# Patient Record
Sex: Male | Born: 1950 | Race: White | Hispanic: No | Marital: Married | Smoking: Never smoker
Health system: Southern US, Community
[De-identification: ages and names within clinical notes are randomized; demographics above are authoritative.]

## PROBLEM LIST (undated history)

## (undated) DIAGNOSIS — I10 Essential (primary) hypertension: Secondary | ICD-10-CM

## (undated) DIAGNOSIS — D099 Carcinoma in situ, unspecified: Secondary | ICD-10-CM

## (undated) DIAGNOSIS — I2109 ST elevation (STEMI) myocardial infarction involving other coronary artery of anterior wall: Secondary | ICD-10-CM

## (undated) DIAGNOSIS — G4733 Obstructive sleep apnea (adult) (pediatric): Secondary | ICD-10-CM

## (undated) DIAGNOSIS — I729 Aneurysm of unspecified site: Secondary | ICD-10-CM

## (undated) DIAGNOSIS — I639 Cerebral infarction, unspecified: Secondary | ICD-10-CM

## (undated) DIAGNOSIS — I472 Ventricular tachycardia: Secondary | ICD-10-CM

## (undated) DIAGNOSIS — Z9989 Dependence on other enabling machines and devices: Secondary | ICD-10-CM

## (undated) DIAGNOSIS — C4492 Squamous cell carcinoma of skin, unspecified: Secondary | ICD-10-CM

## (undated) DIAGNOSIS — I5022 Chronic systolic (congestive) heart failure: Secondary | ICD-10-CM

## (undated) DIAGNOSIS — C4491 Basal cell carcinoma of skin, unspecified: Secondary | ICD-10-CM

## (undated) DIAGNOSIS — I2510 Atherosclerotic heart disease of native coronary artery without angina pectoris: Secondary | ICD-10-CM

## (undated) DIAGNOSIS — Z8719 Personal history of other diseases of the digestive system: Secondary | ICD-10-CM

## (undated) DIAGNOSIS — I255 Ischemic cardiomyopathy: Secondary | ICD-10-CM

## (undated) DIAGNOSIS — Z95810 Presence of automatic (implantable) cardiac defibrillator: Secondary | ICD-10-CM

## (undated) DIAGNOSIS — I4729 Other ventricular tachycardia: Secondary | ICD-10-CM

## (undated) DIAGNOSIS — I480 Paroxysmal atrial fibrillation: Secondary | ICD-10-CM

## (undated) DIAGNOSIS — I770 Arteriovenous fistula, acquired: Secondary | ICD-10-CM

## (undated) DIAGNOSIS — G4739 Other sleep apnea: Secondary | ICD-10-CM

## (undated) HISTORY — DX: Aneurysm of unspecified site: I72.9

## (undated) HISTORY — DX: Chronic systolic (congestive) heart failure: I50.22

## (undated) HISTORY — DX: Atherosclerotic heart disease of native coronary artery without angina pectoris: I25.10

## (undated) HISTORY — DX: Basal cell carcinoma of skin, unspecified: C44.91

## (undated) HISTORY — DX: ST elevation (STEMI) myocardial infarction involving other coronary artery of anterior wall: I21.09

## (undated) HISTORY — DX: Ventricular tachycardia: I47.2

## (undated) HISTORY — DX: Other ventricular tachycardia: I47.29

## (undated) HISTORY — DX: Ischemic cardiomyopathy: I25.5

## (undated) HISTORY — DX: Squamous cell carcinoma of skin, unspecified: C44.92

## (undated) HISTORY — DX: Carcinoma in situ, unspecified: D09.9

## (undated) HISTORY — DX: Obstructive sleep apnea (adult) (pediatric): G47.33

## (undated) HISTORY — DX: Paroxysmal atrial fibrillation: I48.0

## (undated) HISTORY — PX: EYE SURGERY: SHX253

## (undated) HISTORY — DX: Cerebral infarction, unspecified: I63.9

## (undated) HISTORY — DX: Dependence on other enabling machines and devices: Z99.89

## (undated) HISTORY — DX: Arteriovenous fistula, acquired: I77.0

## (undated) HISTORY — DX: Other sleep apnea: G47.39

---

## 1998-07-24 ENCOUNTER — Inpatient Hospital Stay (HOSPITAL_COMMUNITY): Admission: EM | Admit: 1998-07-24 | Discharge: 1998-07-25 | Payer: Self-pay | Admitting: Cardiology

## 1999-04-24 ENCOUNTER — Encounter: Payer: Self-pay | Admitting: Internal Medicine

## 1999-04-24 ENCOUNTER — Inpatient Hospital Stay (HOSPITAL_COMMUNITY): Admission: EM | Admit: 1999-04-24 | Discharge: 1999-04-26 | Payer: Self-pay | Admitting: Internal Medicine

## 2000-05-26 ENCOUNTER — Emergency Department (HOSPITAL_COMMUNITY): Admission: EM | Admit: 2000-05-26 | Discharge: 2000-05-26 | Payer: Self-pay | Admitting: Emergency Medicine

## 2000-07-24 ENCOUNTER — Ambulatory Visit (HOSPITAL_COMMUNITY): Admission: RE | Admit: 2000-07-24 | Discharge: 2000-07-24 | Payer: Self-pay | Admitting: Internal Medicine

## 2000-07-24 ENCOUNTER — Encounter: Payer: Self-pay | Admitting: Internal Medicine

## 2000-09-29 ENCOUNTER — Ambulatory Visit (HOSPITAL_COMMUNITY): Admission: AD | Admit: 2000-09-29 | Discharge: 2000-10-01 | Payer: Self-pay | Admitting: Internal Medicine

## 2000-09-30 ENCOUNTER — Encounter: Payer: Self-pay | Admitting: Internal Medicine

## 2003-04-27 ENCOUNTER — Inpatient Hospital Stay (HOSPITAL_COMMUNITY): Admission: EM | Admit: 2003-04-27 | Discharge: 2003-05-04 | Payer: Self-pay | Admitting: Emergency Medicine

## 2003-04-27 ENCOUNTER — Encounter: Payer: Self-pay | Admitting: Emergency Medicine

## 2003-04-28 ENCOUNTER — Encounter: Payer: Self-pay | Admitting: Internal Medicine

## 2004-08-11 ENCOUNTER — Ambulatory Visit: Payer: Self-pay | Admitting: Cardiology

## 2004-09-09 ENCOUNTER — Ambulatory Visit: Payer: Self-pay | Admitting: Cardiology

## 2004-09-19 DIAGNOSIS — Z95810 Presence of automatic (implantable) cardiac defibrillator: Secondary | ICD-10-CM

## 2004-09-19 HISTORY — DX: Presence of automatic (implantable) cardiac defibrillator: Z95.810

## 2004-10-28 ENCOUNTER — Ambulatory Visit: Payer: Self-pay | Admitting: Cardiology

## 2004-11-24 ENCOUNTER — Ambulatory Visit: Payer: Self-pay | Admitting: Cardiology

## 2004-12-22 ENCOUNTER — Ambulatory Visit: Payer: Self-pay | Admitting: Cardiology

## 2005-01-27 ENCOUNTER — Ambulatory Visit: Payer: Self-pay | Admitting: Cardiology

## 2005-02-09 ENCOUNTER — Ambulatory Visit: Payer: Self-pay | Admitting: Cardiology

## 2005-02-25 ENCOUNTER — Ambulatory Visit: Payer: Self-pay | Admitting: Cardiology

## 2005-04-01 ENCOUNTER — Ambulatory Visit: Payer: Self-pay | Admitting: *Deleted

## 2005-05-05 ENCOUNTER — Ambulatory Visit: Payer: Self-pay | Admitting: Cardiology

## 2005-06-03 ENCOUNTER — Ambulatory Visit: Payer: Self-pay | Admitting: Cardiology

## 2005-06-08 ENCOUNTER — Ambulatory Visit: Payer: Self-pay | Admitting: Internal Medicine

## 2005-06-09 ENCOUNTER — Ambulatory Visit: Payer: Self-pay | Admitting: Internal Medicine

## 2005-06-23 ENCOUNTER — Ambulatory Visit: Payer: Self-pay | Admitting: Cardiology

## 2005-06-23 ENCOUNTER — Inpatient Hospital Stay (HOSPITAL_BASED_OUTPATIENT_CLINIC_OR_DEPARTMENT_OTHER): Admission: RE | Admit: 2005-06-23 | Discharge: 2005-06-23 | Payer: Self-pay | Admitting: Cardiology

## 2005-07-01 ENCOUNTER — Ambulatory Visit: Payer: Self-pay | Admitting: Cardiology

## 2005-07-12 ENCOUNTER — Ambulatory Visit: Payer: Self-pay | Admitting: Cardiology

## 2005-07-21 ENCOUNTER — Ambulatory Visit: Payer: Self-pay | Admitting: Cardiology

## 2005-11-25 ENCOUNTER — Ambulatory Visit: Payer: Self-pay | Admitting: Cardiology

## 2005-12-09 ENCOUNTER — Ambulatory Visit: Payer: Self-pay | Admitting: Cardiology

## 2005-12-14 ENCOUNTER — Ambulatory Visit: Payer: Self-pay | Admitting: Cardiology

## 2005-12-28 ENCOUNTER — Ambulatory Visit: Payer: Self-pay | Admitting: Cardiology

## 2006-01-11 ENCOUNTER — Ambulatory Visit: Payer: Self-pay | Admitting: Cardiology

## 2006-01-13 ENCOUNTER — Ambulatory Visit: Payer: Self-pay | Admitting: Cardiology

## 2006-01-27 ENCOUNTER — Ambulatory Visit: Payer: Self-pay | Admitting: Cardiology

## 2006-02-01 ENCOUNTER — Ambulatory Visit: Payer: Self-pay | Admitting: Internal Medicine

## 2006-02-03 ENCOUNTER — Ambulatory Visit: Payer: Self-pay | Admitting: Cardiology

## 2006-05-14 ENCOUNTER — Ambulatory Visit: Payer: Self-pay | Admitting: Internal Medicine

## 2006-06-09 ENCOUNTER — Ambulatory Visit: Payer: Self-pay | Admitting: Cardiology

## 2006-08-03 ENCOUNTER — Ambulatory Visit: Payer: Self-pay | Admitting: Cardiology

## 2006-08-04 DIAGNOSIS — C4492 Squamous cell carcinoma of skin, unspecified: Secondary | ICD-10-CM

## 2006-08-04 DIAGNOSIS — D099 Carcinoma in situ, unspecified: Secondary | ICD-10-CM

## 2006-08-04 HISTORY — DX: Carcinoma in situ, unspecified: D09.9

## 2006-08-04 HISTORY — DX: Squamous cell carcinoma of skin, unspecified: C44.92

## 2006-08-29 ENCOUNTER — Ambulatory Visit: Payer: Self-pay | Admitting: Cardiology

## 2006-09-07 ENCOUNTER — Ambulatory Visit: Payer: Self-pay | Admitting: Cardiology

## 2006-10-18 ENCOUNTER — Ambulatory Visit: Payer: Self-pay | Admitting: Cardiology

## 2006-11-09 ENCOUNTER — Ambulatory Visit: Payer: Self-pay | Admitting: Internal Medicine

## 2006-11-10 ENCOUNTER — Ambulatory Visit: Payer: Self-pay | Admitting: Cardiology

## 2006-12-01 ENCOUNTER — Ambulatory Visit: Payer: Self-pay | Admitting: Internal Medicine

## 2006-12-11 ENCOUNTER — Ambulatory Visit: Payer: Self-pay | Admitting: Cardiology

## 2006-12-25 ENCOUNTER — Ambulatory Visit: Payer: Self-pay | Admitting: Cardiology

## 2007-01-01 ENCOUNTER — Ambulatory Visit: Payer: Self-pay | Admitting: Cardiology

## 2007-01-05 ENCOUNTER — Ambulatory Visit: Payer: Self-pay | Admitting: Cardiology

## 2007-01-22 ENCOUNTER — Ambulatory Visit: Payer: Self-pay | Admitting: Cardiology

## 2007-01-30 ENCOUNTER — Ambulatory Visit: Payer: Self-pay | Admitting: Cardiology

## 2007-02-09 ENCOUNTER — Ambulatory Visit: Payer: Self-pay | Admitting: Internal Medicine

## 2007-03-02 ENCOUNTER — Ambulatory Visit: Payer: Self-pay | Admitting: Physician Assistant

## 2007-03-18 ENCOUNTER — Ambulatory Visit: Payer: Self-pay | Admitting: Internal Medicine

## 2007-03-21 ENCOUNTER — Ambulatory Visit: Payer: Self-pay | Admitting: Internal Medicine

## 2007-03-27 ENCOUNTER — Ambulatory Visit: Payer: Self-pay | Admitting: Cardiology

## 2007-04-06 ENCOUNTER — Ambulatory Visit: Payer: Self-pay | Admitting: Cardiology

## 2007-04-06 ENCOUNTER — Ambulatory Visit: Payer: Self-pay | Admitting: Internal Medicine

## 2007-05-01 ENCOUNTER — Ambulatory Visit: Payer: Self-pay | Admitting: Cardiology

## 2007-05-02 ENCOUNTER — Encounter: Payer: Self-pay | Admitting: Cardiology

## 2007-05-02 ENCOUNTER — Ambulatory Visit: Payer: Self-pay | Admitting: Physician Assistant

## 2007-06-01 ENCOUNTER — Ambulatory Visit: Payer: Self-pay | Admitting: Cardiology

## 2007-06-08 ENCOUNTER — Ambulatory Visit: Payer: Self-pay | Admitting: Cardiology

## 2007-06-27 ENCOUNTER — Ambulatory Visit: Payer: Self-pay | Admitting: Cardiology

## 2007-07-05 ENCOUNTER — Ambulatory Visit: Payer: Self-pay | Admitting: Internal Medicine

## 2007-07-13 ENCOUNTER — Ambulatory Visit: Payer: Self-pay | Admitting: Cardiology

## 2007-08-10 ENCOUNTER — Ambulatory Visit: Payer: Self-pay | Admitting: Cardiology

## 2007-08-24 ENCOUNTER — Ambulatory Visit: Payer: Self-pay | Admitting: Cardiology

## 2007-09-07 ENCOUNTER — Ambulatory Visit: Payer: Self-pay | Admitting: Internal Medicine

## 2007-09-10 ENCOUNTER — Ambulatory Visit: Payer: Self-pay | Admitting: Cardiology

## 2007-09-17 ENCOUNTER — Ambulatory Visit: Payer: Self-pay | Admitting: Cardiology

## 2007-10-04 ENCOUNTER — Encounter: Payer: Self-pay | Admitting: Cardiology

## 2007-10-18 ENCOUNTER — Ambulatory Visit: Payer: Self-pay | Admitting: Cardiology

## 2007-11-16 ENCOUNTER — Ambulatory Visit: Payer: Self-pay | Admitting: Cardiology

## 2007-12-07 ENCOUNTER — Ambulatory Visit: Payer: Self-pay | Admitting: Cardiology

## 2007-12-13 ENCOUNTER — Ambulatory Visit: Payer: Self-pay | Admitting: Internal Medicine

## 2007-12-24 ENCOUNTER — Ambulatory Visit: Payer: Self-pay | Admitting: Cardiology

## 2008-01-10 ENCOUNTER — Ambulatory Visit: Payer: Self-pay | Admitting: Internal Medicine

## 2008-02-05 ENCOUNTER — Ambulatory Visit: Payer: Self-pay | Admitting: Cardiology

## 2008-02-19 ENCOUNTER — Ambulatory Visit: Payer: Self-pay | Admitting: Cardiology

## 2008-03-04 ENCOUNTER — Ambulatory Visit: Payer: Self-pay | Admitting: Cardiology

## 2008-04-15 ENCOUNTER — Ambulatory Visit: Payer: Self-pay | Admitting: Cardiology

## 2008-05-13 ENCOUNTER — Ambulatory Visit: Payer: Self-pay | Admitting: Cardiology

## 2008-06-10 ENCOUNTER — Ambulatory Visit: Payer: Self-pay | Admitting: Cardiology

## 2008-06-17 ENCOUNTER — Ambulatory Visit: Payer: Self-pay | Admitting: Cardiology

## 2008-06-19 ENCOUNTER — Encounter: Payer: Self-pay | Admitting: Cardiology

## 2008-06-26 ENCOUNTER — Ambulatory Visit: Payer: Self-pay | Admitting: Cardiology

## 2008-06-26 ENCOUNTER — Inpatient Hospital Stay (HOSPITAL_BASED_OUTPATIENT_CLINIC_OR_DEPARTMENT_OTHER): Admission: RE | Admit: 2008-06-26 | Discharge: 2008-06-26 | Payer: Self-pay | Admitting: Cardiology

## 2008-06-26 ENCOUNTER — Ambulatory Visit: Payer: Self-pay | Admitting: Cardiovascular Disease

## 2008-06-26 ENCOUNTER — Observation Stay (HOSPITAL_COMMUNITY): Admission: AD | Admit: 2008-06-26 | Discharge: 2008-06-27 | Payer: Self-pay | Admitting: Cardiology

## 2008-06-26 ENCOUNTER — Encounter: Payer: Self-pay | Admitting: Cardiology

## 2008-07-01 ENCOUNTER — Ambulatory Visit: Payer: Self-pay | Admitting: Cardiology

## 2008-07-01 ENCOUNTER — Encounter: Payer: Self-pay | Admitting: Physician Assistant

## 2008-07-01 ENCOUNTER — Encounter: Payer: Self-pay | Admitting: Cardiology

## 2008-07-03 ENCOUNTER — Ambulatory Visit (HOSPITAL_COMMUNITY): Admission: RE | Admit: 2008-07-03 | Discharge: 2008-07-03 | Payer: Self-pay | Admitting: Cardiovascular Disease

## 2008-07-03 ENCOUNTER — Ambulatory Visit: Payer: Self-pay | Admitting: Cardiovascular Disease

## 2008-07-03 ENCOUNTER — Ambulatory Visit: Payer: Self-pay | Admitting: Surgery

## 2008-07-03 ENCOUNTER — Encounter: Payer: Self-pay | Admitting: Cardiovascular Disease

## 2008-07-04 ENCOUNTER — Ambulatory Visit: Payer: Self-pay | Admitting: Cardiology

## 2008-07-08 ENCOUNTER — Ambulatory Visit: Payer: Self-pay | Admitting: Cardiology

## 2008-07-22 ENCOUNTER — Ambulatory Visit: Payer: Self-pay | Admitting: Cardiology

## 2008-08-05 ENCOUNTER — Encounter: Payer: Self-pay | Admitting: Cardiology

## 2008-08-05 ENCOUNTER — Ambulatory Visit: Payer: Self-pay | Admitting: Cardiology

## 2008-08-05 DIAGNOSIS — I255 Ischemic cardiomyopathy: Secondary | ICD-10-CM

## 2008-08-05 DIAGNOSIS — I7789 Other specified disorders of arteries and arterioles: Secondary | ICD-10-CM

## 2008-08-05 DIAGNOSIS — I2510 Atherosclerotic heart disease of native coronary artery without angina pectoris: Secondary | ICD-10-CM | POA: Insufficient documentation

## 2008-08-05 DIAGNOSIS — I253 Aneurysm of heart: Secondary | ICD-10-CM | POA: Insufficient documentation

## 2008-08-05 DIAGNOSIS — I6350 Cerebral infarction due to unspecified occlusion or stenosis of unspecified cerebral artery: Secondary | ICD-10-CM | POA: Insufficient documentation

## 2008-08-26 ENCOUNTER — Ambulatory Visit: Payer: Self-pay | Admitting: Cardiology

## 2008-08-28 ENCOUNTER — Encounter: Payer: Self-pay | Admitting: Cardiology

## 2008-09-03 ENCOUNTER — Ambulatory Visit: Payer: Self-pay | Admitting: Cardiology

## 2008-09-23 ENCOUNTER — Ambulatory Visit: Payer: Self-pay | Admitting: Internal Medicine

## 2008-09-23 ENCOUNTER — Ambulatory Visit: Payer: Self-pay | Admitting: Cardiology

## 2008-10-10 ENCOUNTER — Ambulatory Visit: Payer: Self-pay | Admitting: Cardiology

## 2008-10-29 ENCOUNTER — Encounter: Payer: Self-pay | Admitting: Cardiology

## 2008-10-29 ENCOUNTER — Ambulatory Visit (HOSPITAL_COMMUNITY): Admission: RE | Admit: 2008-10-29 | Discharge: 2008-10-29 | Payer: Self-pay | Admitting: Internal Medicine

## 2008-10-29 ENCOUNTER — Ambulatory Visit: Payer: Self-pay | Admitting: Internal Medicine

## 2008-10-30 ENCOUNTER — Encounter: Payer: Self-pay | Admitting: Internal Medicine

## 2008-10-31 ENCOUNTER — Ambulatory Visit: Payer: Self-pay | Admitting: Cardiology

## 2008-11-17 ENCOUNTER — Ambulatory Visit: Payer: Self-pay

## 2008-11-17 ENCOUNTER — Ambulatory Visit: Payer: Self-pay | Admitting: Cardiovascular Disease

## 2008-12-11 ENCOUNTER — Ambulatory Visit: Payer: Self-pay | Admitting: Cardiology

## 2009-01-06 ENCOUNTER — Ambulatory Visit: Payer: Self-pay | Admitting: Cardiology

## 2009-01-12 ENCOUNTER — Ambulatory Visit: Payer: Self-pay | Admitting: Internal Medicine

## 2009-01-15 ENCOUNTER — Encounter (INDEPENDENT_AMBULATORY_CARE_PROVIDER_SITE_OTHER): Payer: Self-pay | Admitting: *Deleted

## 2009-02-10 ENCOUNTER — Ambulatory Visit: Payer: Self-pay | Admitting: Cardiology

## 2009-02-20 ENCOUNTER — Ambulatory Visit: Payer: Self-pay | Admitting: Internal Medicine

## 2009-02-25 ENCOUNTER — Telehealth: Payer: Self-pay | Admitting: Internal Medicine

## 2009-02-27 ENCOUNTER — Ambulatory Visit: Payer: Self-pay | Admitting: Cardiology

## 2009-02-27 ENCOUNTER — Encounter: Payer: Self-pay | Admitting: Internal Medicine

## 2009-03-03 ENCOUNTER — Telehealth (INDEPENDENT_AMBULATORY_CARE_PROVIDER_SITE_OTHER): Payer: Self-pay | Admitting: *Deleted

## 2009-03-06 ENCOUNTER — Encounter: Payer: Self-pay | Admitting: Internal Medicine

## 2009-03-06 ENCOUNTER — Telehealth: Payer: Self-pay | Admitting: Internal Medicine

## 2009-03-10 ENCOUNTER — Ambulatory Visit: Payer: Self-pay | Admitting: Cardiology

## 2009-03-13 ENCOUNTER — Encounter: Payer: Self-pay | Admitting: Internal Medicine

## 2009-04-07 ENCOUNTER — Ambulatory Visit: Payer: Self-pay

## 2009-04-10 ENCOUNTER — Encounter: Payer: Self-pay | Admitting: Cardiology

## 2009-05-04 ENCOUNTER — Encounter: Payer: Self-pay | Admitting: *Deleted

## 2009-05-05 ENCOUNTER — Ambulatory Visit: Payer: Self-pay | Admitting: Cardiology

## 2009-05-05 ENCOUNTER — Encounter: Payer: Self-pay | Admitting: Cardiology

## 2009-05-05 LAB — CONVERTED CEMR LAB
POC INR: 1.9
Prothrombin Time: 17.1 s

## 2009-05-21 ENCOUNTER — Encounter: Payer: Self-pay | Admitting: Cardiology

## 2009-05-27 ENCOUNTER — Telehealth (INDEPENDENT_AMBULATORY_CARE_PROVIDER_SITE_OTHER): Payer: Self-pay | Admitting: *Deleted

## 2009-06-02 ENCOUNTER — Encounter: Payer: Self-pay | Admitting: Internal Medicine

## 2009-06-05 ENCOUNTER — Ambulatory Visit: Payer: Self-pay | Admitting: Cardiology

## 2009-06-05 ENCOUNTER — Encounter: Payer: Self-pay | Admitting: Internal Medicine

## 2009-06-05 LAB — CONVERTED CEMR LAB: POC INR: 1.9

## 2009-07-07 ENCOUNTER — Ambulatory Visit: Payer: Self-pay | Admitting: Cardiology

## 2009-07-07 LAB — CONVERTED CEMR LAB: POC INR: 3.1

## 2009-08-04 ENCOUNTER — Ambulatory Visit: Payer: Self-pay | Admitting: Cardiology

## 2009-09-01 ENCOUNTER — Ambulatory Visit: Payer: Self-pay | Admitting: Cardiology

## 2009-09-06 ENCOUNTER — Encounter: Payer: Self-pay | Admitting: Internal Medicine

## 2009-09-07 ENCOUNTER — Ambulatory Visit: Payer: Self-pay | Admitting: Internal Medicine

## 2009-09-19 HISTORY — PX: OTHER SURGICAL HISTORY: SHX169

## 2009-09-21 ENCOUNTER — Encounter: Payer: Self-pay | Admitting: Internal Medicine

## 2009-10-02 ENCOUNTER — Ambulatory Visit: Payer: Self-pay | Admitting: Cardiology

## 2009-10-02 LAB — CONVERTED CEMR LAB: POC INR: 3.7

## 2009-10-20 ENCOUNTER — Encounter: Payer: Self-pay | Admitting: Cardiology

## 2009-10-20 ENCOUNTER — Encounter: Payer: Self-pay | Admitting: Internal Medicine

## 2009-10-26 ENCOUNTER — Encounter: Payer: Self-pay | Admitting: Internal Medicine

## 2009-10-26 ENCOUNTER — Encounter: Payer: Self-pay | Admitting: Cardiology

## 2009-10-30 ENCOUNTER — Ambulatory Visit: Payer: Self-pay | Admitting: Cardiology

## 2009-10-30 LAB — CONVERTED CEMR LAB: POC INR: 2.2

## 2009-11-22 ENCOUNTER — Encounter: Payer: Self-pay | Admitting: Internal Medicine

## 2009-11-24 ENCOUNTER — Encounter: Payer: Self-pay | Admitting: Cardiology

## 2009-11-24 ENCOUNTER — Telehealth (INDEPENDENT_AMBULATORY_CARE_PROVIDER_SITE_OTHER): Payer: Self-pay | Admitting: *Deleted

## 2009-11-24 ENCOUNTER — Ambulatory Visit: Payer: Self-pay | Admitting: Internal Medicine

## 2009-11-24 DIAGNOSIS — I4891 Unspecified atrial fibrillation: Secondary | ICD-10-CM

## 2009-12-04 ENCOUNTER — Ambulatory Visit: Payer: Self-pay | Admitting: Cardiology

## 2009-12-04 DIAGNOSIS — E782 Mixed hyperlipidemia: Secondary | ICD-10-CM | POA: Insufficient documentation

## 2010-01-04 ENCOUNTER — Telehealth (INDEPENDENT_AMBULATORY_CARE_PROVIDER_SITE_OTHER): Payer: Self-pay | Admitting: *Deleted

## 2010-01-05 ENCOUNTER — Ambulatory Visit: Payer: Self-pay | Admitting: Cardiology

## 2010-01-05 LAB — CONVERTED CEMR LAB: POC INR: 1.9

## 2010-02-10 ENCOUNTER — Encounter (INDEPENDENT_AMBULATORY_CARE_PROVIDER_SITE_OTHER): Payer: Self-pay | Admitting: Pharmacist

## 2010-02-19 ENCOUNTER — Ambulatory Visit: Payer: Self-pay | Admitting: Cardiology

## 2010-02-19 LAB — CONVERTED CEMR LAB: POC INR: 2.1

## 2010-03-23 ENCOUNTER — Ambulatory Visit: Payer: Self-pay | Admitting: Cardiology

## 2010-04-13 ENCOUNTER — Ambulatory Visit: Payer: Self-pay | Admitting: Cardiology

## 2010-04-28 ENCOUNTER — Encounter: Payer: Self-pay | Admitting: Cardiology

## 2010-05-05 ENCOUNTER — Encounter (INDEPENDENT_AMBULATORY_CARE_PROVIDER_SITE_OTHER): Payer: Self-pay | Admitting: Pharmacist

## 2010-05-07 ENCOUNTER — Encounter (INDEPENDENT_AMBULATORY_CARE_PROVIDER_SITE_OTHER): Payer: Self-pay | Admitting: *Deleted

## 2010-06-02 ENCOUNTER — Encounter (INDEPENDENT_AMBULATORY_CARE_PROVIDER_SITE_OTHER): Payer: Self-pay | Admitting: Pharmacist

## 2010-06-07 ENCOUNTER — Ambulatory Visit: Payer: Self-pay | Admitting: Cardiology

## 2010-06-18 ENCOUNTER — Encounter (INDEPENDENT_AMBULATORY_CARE_PROVIDER_SITE_OTHER): Payer: Self-pay | Admitting: *Deleted

## 2010-07-05 ENCOUNTER — Ambulatory Visit: Payer: Self-pay | Admitting: Cardiology

## 2010-07-14 ENCOUNTER — Ambulatory Visit: Payer: Self-pay

## 2010-07-19 ENCOUNTER — Ambulatory Visit: Payer: Self-pay | Admitting: Internal Medicine

## 2010-07-19 DIAGNOSIS — R0609 Other forms of dyspnea: Secondary | ICD-10-CM

## 2010-07-19 DIAGNOSIS — R0989 Other specified symptoms and signs involving the circulatory and respiratory systems: Secondary | ICD-10-CM

## 2010-07-22 ENCOUNTER — Encounter: Payer: Self-pay | Admitting: Cardiology

## 2010-08-09 ENCOUNTER — Ambulatory Visit: Payer: Self-pay | Admitting: Cardiology

## 2010-08-09 LAB — CONVERTED CEMR LAB: POC INR: 2.3

## 2010-09-06 ENCOUNTER — Ambulatory Visit: Payer: Self-pay

## 2010-09-06 ENCOUNTER — Ambulatory Visit: Payer: Self-pay | Admitting: Cardiology

## 2010-09-06 DIAGNOSIS — I5022 Chronic systolic (congestive) heart failure: Secondary | ICD-10-CM | POA: Insufficient documentation

## 2010-09-06 DIAGNOSIS — R0609 Other forms of dyspnea: Secondary | ICD-10-CM

## 2010-09-08 ENCOUNTER — Encounter: Payer: Self-pay | Admitting: Cardiology

## 2010-09-16 ENCOUNTER — Encounter (INDEPENDENT_AMBULATORY_CARE_PROVIDER_SITE_OTHER): Payer: Self-pay | Admitting: *Deleted

## 2010-10-06 ENCOUNTER — Ambulatory Visit
Admission: RE | Admit: 2010-10-06 | Discharge: 2010-10-06 | Payer: Self-pay | Source: Home / Self Care | Attending: Cardiology | Admitting: Cardiology

## 2010-10-21 ENCOUNTER — Encounter: Payer: Self-pay | Admitting: Internal Medicine

## 2010-10-21 NOTE — Assessment & Plan Note (Signed)
Summary: rov/jml   Primary Provider:  Dr Dimas Aguas  CC:  rov/ Pt in hospital Sunday morning due to firing of new defibrillator.  He feels it was a matter of dehydration from this cold he cannot get rid of.  Pt also needs to check dosage of fosinopril.  Pt has had continual dry cough.  History of Present Illness:     [name] is seen in followup for ischemic heart disease with prior stenting. He also has an ICD implanted which delivered at an appropriate discharge over the weekend for rapid atrial fibrillation This occurred in the context of what was thought to be dehydration in the setting of a chronic cough.  Lipid cardiogram was reviewed confirming atrial fibrillation;  laboratories were unrevealing  He denies chest pain. There has been significant fatigue.  Current Medications (verified): 1)  Coumadin 5 Mg Tabs (Warfarin Sodium) .... Take As Directed By Coumadin Clinic 2)  Fosinopril Sodium 20 Mg Tabs (Fosinopril Sodium) .... Take 2 Tablets Daily 3)  Spironolactone 25 Mg Tabs (Spironolactone) .... 1 Tab Daily 4)  Vytorin 10-80 Mg Tabs (Ezetimibe-Simvastatin) .... Take 1 Tab By Mouth At Bedtime 5)  Lovaza 1 Gm Caps (Omega-3-Acid Ethyl Esters) .... 4 Tablets Taken Daily.  Discontinued Over Recent Concerns Over High Cholesterol 6)  Toprol Xl 50 Mg Xr24h-Tab (Metoprolol Succinate) .... Take One Tablet Once Daily 7)  Aspirin 81 Mg Tabs (Aspirin) .... Once Daily 8)  Vitamin D 1000 Unit Tabs (Cholecalciferol) .... Take One Tablet Once Daily  Allergies (verified): No Known Drug Allergies  Vital Signs:  Patient profile:   60 year old male Height:      73 inches Weight:      219 pounds BMI:     29 .00 Pulse rate:   65 / minute Pulse rhythm:   regular BP sitting:   100 / 64  (left arm) Cuff size:   regular  Vitals Entered By: Judithe Modest CMA (November 24, 2009 9:36 AM)  Physical Exam  General:  The patient was alert and oriented in no acute distress. HEENT Normal.  Neck veins were  flat, carotids were brisk.  Lungs were clear.  Heart sounds were regular with S4 Abdomen was soft with active bowel sounds. There is no clubbing cyanosis or edema. 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  No Battery Voltage:  3.16 V     Charge Time:  11.9 seconds     Underlying rhythm:  SR WITH PVC'S ICD Dependent:  No       ICD Device Measurements Right Ventricle:  Amplitude: 10.4 mV, Impedance: 580 ohms, Threshold: 0.75 V at 0.5 msec Shock Impedance: 46 ohms   Episodes Shock:  0     ATP:  0     Nonsustained:  0  Atrial Therapies:  0 Ventricular Pacing:  <1%  Brady Parameters Mode VVI     Lower Rate Limit:  40      Tachy Zones VF:  240     VT:  210     VT1:  141 (MONITOR)     Next Cardiology Appt Due:  02/17/2010 Tech Comments:  Normal device function.  Pt s/p shock over the weekend for likley afib with RVR in the setting of some dehydration and sinus infection.  No changes made today.  Plan per SK. Gypsy Balsam RN BSN  November 24, 2009 10:00 AM   Impression & Recommendations:  Problem # 1:  ATRIAL FIBRILLATION (ICD-427.31) The patient had recurrent atrial fibrillation associated with a rapid rate and defibrillator discharge. We have reprogrammed AICD to exclude a time out.  We'll anticipate trying to increase his beta blocker. However, because he has significant fatigue we will do that in steps. We will initially try to sort out whether his cough is related to his ACE inhibitor. Once this issue is  resolved we will change his beta blocker to bisoprolol and see if this doesn't have an intact on the fatigue.  His updated medication list for this problem includes:    Coumadin 5 Mg Tabs (Warfarin sodium) .[name]... Take as directed by coumadin clinic    Toprol Xl 50 Mg Xr24h-tab (Metoprolol succinate) .[name]... Take one tablet once daily    Aspirin 81 Mg Tabs (Aspirin) ..... Once daily  Problem # 2:  COUGH QUESTION ACE INHIBITOR (ICD-786.2) as above we will exclude his lisinopril for 2 weeks and see if his cough resolves. In the event that he does we will put him on an ARB. In the event that it does not he has been reminded to go and see his primary care physician His updated medication list for this problem includes:    Coumadin 5 Mg Tabs (Warfarin sodium) .[name]... Take as directed by coumadin clinic    Fosinopril Sodium 20 Mg Tabs (Fosinopril sodium) ..... Hold for 2 weeks    Toprol Xl 50 Mg Xr24h-tab (Metoprolol succinate) .[name]... Take one tablet once daily    Aspirin 81 Mg Tabs (Aspirin) ..... Once daily  Problem # 3:  FATIGUE (ICD-780.79) as above  Problem # 4:  IMPLANTATION OF DEFIBRILLATOR, ST[employer] JUDE CURRENT (ICD-V45.02) as above the device was reprogrammed  Other Orders: EKG w/ Interpretation (93000)  Patient Instructions: 1)  Your physician has recommended you make the following change in your medication: HOLD YOUR FOSINOPRIL FOR 2 WEEKS TO SEE IF YOUR COUGH GETS BETTER/ GOES AWAY. 2)  CALL MELANIE, RN TO LET ME KNOW HOW YOUR COUGH IS AT THAT TIME 3)  Your physician recommends that you schedule a follow-up appointment in: 4 MONTHS  Appended Document: rov/jml When he calls back: 1. If his cough is gone we will plan to start an ARB 2. If he still has the cough he should f/u with PCP 3. Stop Toprol and change to Bisoprolol 5mg  once daily at this time.

## 2010-10-21 NOTE — Letter (Signed)
Summary: Custom - Delinquent Coumadin 1  [facility]  [address]   [place], [place] [address]   Phone: [phone]  Fax: [phone]     May 05, 2010 MRN: [id number]   [name] [address] [place], [place]  [address]   Dear Mr. WIENEKE,  This letter is being sent to you as a reminder that it is necessary for you to get your INR/PT checked regularly so that we can optimize your care.  Our records indicate that you were scheduled to have a test done recently.  As of today, we have not received the results of this test.  It is very important that you have your INR checked.  Please call our office at the number listed above to schedule an appointment at your earliest convenience.    If you have recently had your protime checked or have discontinued this medication, please contact our office at the above phone number to clarify this issue.  Thank you for this prompt attention to this important health care matter.  Sincerely, Vashti Hey RN  [facility] Reduction [facility] Team

## 2010-10-21 NOTE — Letter (Signed)
Summary: Custom - Delinquent Coumadin 1  [facility] at [facility]  [address]   [place], [place] [address]   Phone: [phone]  Fax: [phone]     Feb 10, 2010 MRN: [id number]   DAMICHAEL HOFMAN [address] [place], [place]  [address]   Dear Mr. BANGURA,  This letter is being sent to you as a reminder that it is necessary for you to get your INR/PT checked regularly so that we can optimize your care.  Our records indicate that you were scheduled to have a test done recently.  As of today, we have not received the results of this test.  It is very important that you have your INR checked.  Please call our office at the number listed above to schedule an appointment at your earliest convenience.    If you have recently had your protime checked or have discontinued this medication, please contact our office at the above phone number to clarify this issue.  Thank you for this prompt attention to this important health care matter.  Sincerely, Vashti Hey RN  [facility] Team

## 2010-10-21 NOTE — Miscellaneous (Signed)
Summary: Orders Update - echo & exercise cardiolite  Clinical Lists Changes  Orders: Added new Referral order of 2-D Echocardiogram (2D Echo) - Signed Added new Referral order of Nuclear Med (Nuc Med) - Signed

## 2010-10-21 NOTE — Medication Information (Signed)
Summary: CCR-R/S FROM NO SHOW  Anticoagulant Therapy  Managed by: Vashti Hey, RN PCP: Dr Sandrea Hughs MD: Andee Lineman MD, Michelle Piper Indication 1: TIA (Transient Ischemic Attack) (ICD-435.0) Lab [facility] [place] Site: [place] INR POC 2.1  Dietary changes: no    Health status changes: no    Bleeding/hemorrhagic complications: no    Recent/future hospitalizations: no    Any changes in medication regimen? no    Recent/future dental: no  Any missed doses?: no       Is patient compliant with meds? yes       Allergies: No Known Drug Allergies  Anticoagulation Management History:      The patient is taking warfarin and comes in today for a routine follow up visit.  Positive risk factors for bleeding include history of CVA/TIA.  Negative risk factors for bleeding include an age less than 64 years old.  The bleeding index is 'intermediate risk'.  Positive CHADS2 values include Prior Stroke/CVA/TIA.  Negative CHADS2 values include Age > 23 years old.  The start date was 01/27/2005.  Anticoagulation responsible provider: Andee Lineman MD, Michelle Piper.  INR POC: 2.1.  Cuvette Lot#: [id number].  Exp: 10/11.    Anticoagulation Management Assessment/Plan:      The patient's current anticoagulation dose is Coumadin 5 mg tabs: Take as directed by Coumadin Clinic.  The target INR is 2 - 3.  The next INR is due 03/23/2010.  Anticoagulation instructions were given to patient.  Results were reviewed/authorized by Vashti Hey, RN.  He was notified by Vashti Hey RN.         Prior Anticoagulation Instructions: INR 1.9 Take coumadin 7.5mg  tonight then resume 5mg  once daily except 2.5mg  on M,W,F  Current Anticoagulation Instructions: INR 2.1 Continue coumadin 5mg  once daily except 2.5mg  on M,W,F

## 2010-10-21 NOTE — Medication Information (Signed)
Summary: ccr-lr  Anticoagulant Therapy  Managed by: Vashti Hey, RN PCP: Dr Sandrea Hughs MD: [name], Remi Deter Indication 1: TIA (Transient Ischemic Attack) (ICD-435.0) Lab [facility]: [facility] [place] Site: [place] INR POC 3.7  Dietary changes: no    Health status changes: no    Bleeding/hemorrhagic complications: no    Recent/future hospitalizations: no    Any changes in medication regimen? no    Recent/future dental: no  Any missed doses?: no       Is patient compliant with meds? yes       Allergies: No Known Drug Allergies  Anticoagulation Management History:      The patient is taking warfarin and comes in today for a routine follow up visit.  Positive risk factors for bleeding include history of CVA/TIA.  Negative risk factors for bleeding include an age less than 58 years old.  The bleeding index is 'intermediate risk'.  Positive CHADS2 values include Prior Stroke/CVA/TIA.  Negative CHADS2 values include Age > 61 years old.  The start date was 01/27/2005.  Anticoagulation responsible provider: [name], Remi Deter.  INR POC: 3.7.  Cuvette Lot#: [id number].  Exp: 10/11.    Anticoagulation Management Assessment/Plan:      The patient's current anticoagulation dose is Coumadin 5 mg tabs: Take as directed by Coumadin Clinic.  The target INR is 2 - 3.  The next INR is due 10/30/2009.  Anticoagulation instructions were given to patient.  Results were reviewed/authorized by Vashti Hey, RN.  He was notified by Vashti Hey RN.         Prior Anticoagulation Instructions: INR 3.6 Has been on Z-pak   Finished 2-3 weeks ago Hold coumadin tonight then resume 5mg  once daily except 2.5mg  on Mondays and Fridays  Current Anticoagulation Instructions: INR 3.7 Hold coumadin tonight then decrease dose to 5mg  once daily except 2.5mg  on M,W,F

## 2010-10-21 NOTE — Medication Information (Signed)
Summary: Environmental health practitioner MEDICINE LIST  RX Folder/ MEDICINE LIST   Imported By: Dorise Hiss 11/13/2009 11:18:21  _____________________________________________________________________  External Attachment:    Type:   Image     Comment:   External Document

## 2010-10-21 NOTE — Assessment & Plan Note (Signed)
Summary: PC2   Visit Type:  Follow-up Primary Yulitza Shorts:  Dr Dimas Aguas  CC:  shortness of breath-Occ.  History of Present Illness: Mr. Buchta is seen after his device interrogation last week demonstrated rapid atrial flutter ablation in April.  He has a history of coronary disease. He has noted increasing shortness of breath of late. 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 He does not think that there has been any associated dietary indiscretion.  He has had no significant palpitations.  He also thinks that he got shocked last February. I was unable to clarify this from his ICD.  Problems Prior to Update: 1)  Dyslipidemia  (ICD-272.4) 2)  Atrial Fibrillation  (ICD-427.31) 3)  Cva  (ICD-434.91) 4)  Implantation of Defibrillator, St. 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 Take As Directed By Coumadin Clinic 2)  Spironolactone 25 Mg Tabs (Spironolactone) .[name].. 1 Tab Daily 3)  Crestor 20 Mg Tabs (Rosuvastatin Calcium) .... Take 1 Tab By Mouth At Bedtime 4)  Lovaza 1 Gm Caps (Omega-3-Acid Ethyl Esters) .... Take 1 Tablet By Mouth Two Times A Day 5)  Bisoprolol Fumarate 10 Mg Tabs (Bisoprolol Fumarate) .... Take 1 Tablet By Mouth Once A Day 6)  Aspirin 81 Mg Tabs (Aspirin) .... Once Daily 7)  Vitamin D 1000 Unit Tabs (Cholecalciferol) .... Take One Tablet Once Daily 8)  Losartan Potassium 50 Mg Tabs (Losartan Potassium) .... Take 1 Tablet By Mouth Once A Day 9)  Finasteride 5 Mg Tabs (Finasteride) .... 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 Jude Current CD 1211  Family History: Last updated: 08/05/2008 noncontributory  Social History: Last updated: 08/05/2008 patient does not smoke or drink  Risk Factors: Smoking Status: never (04/13/2010)  Vital Signs:  Patient profile:   60 year old male Height:      73 inches Weight:      223.50 pounds BMI:     29.59 Pulse rate:   56 / minute BP sitting:   132 / 74  (left arm)  Vitals Entered By: Caralee Ates CMA (July 19, 2010 4:23 PM)  Physical Exam  General:  The patient was alert and oriented in no acute distress. HEENT Normal.  Neck veins were flat, carotids were brisk.  Lungs were clear.  Heart sounds were regular without murmurs or gallops.  Abdomen was soft with active bowel sounds. There is no clubbing cyanosis or edema. Skin Warm and dry     ICD Specifications Following MD:  Sherryl Manges, MD     ICD Vendor:  [facility] Jude     ICD Model Number:  [id number]     ICD Serial Number:  [id number] ICD DOI:  10/29/2008     ICD Implanting MD:  Viviann Spare  Graciela Husbands, MD  Lead 1:    Location: RV     DOI: 09/29/2000     Model #: [id number]     Serial #: [id number]     Status: active  Indications::  ICM   ICD Follow Up ICD Dependent:  No      Brady Parameters Mode VVI     Lower Rate Limit:  40      Tachy Zones VF:  240     VT:  210     VT1:  141 (MONITOR)     Impression & Recommendations:  Problem # 1:  ATRIAL FIBRILLATION (ICD-427.31) no significant atrial fibrillation has been detected of late. His  updated medication list for this problem includes:    Coumadin 5 Mg Tabs (Warfarin sodium) .[name]... Take as directed by coumadin clinic    Bisoprolol Fumarate 10 Mg Tabs (Bisoprolol fumarate) .[name]... Take 1 tablet by mouth once a day    Aspirin 81 Mg Tabs (Aspirin) ..... Once daily  Orders: EKG w/ Interpretation (93000)  Problem # 2:  DYSPNEA ON EXERTION (ICD-786.09) Their potential number of concerns that might give rise to this; the first would be progressive left ventricular dysfunction, the second progressive ischemia, the third chronotropic incompetence. I will discuss with Dr. GD the role of repeat Myoview scanning this time with stress testing His updated medication list for this problem includes:    Spironolactone 25 Mg Tabs (Spironolactone) .[name]... 1 tab daily    Bisoprolol Fumarate 10 Mg Tabs (Bisoprolol fumarate) .[name]... Take 1 tablet by mouth once a day    Aspirin 81 Mg Tabs (Aspirin) ..... Once daily    Losartan Potassium 50 Mg Tabs (Losartan potassium) .[name]... Take 1 tablet by mouth once a day  Problem # 3:  IMPLANTATION OF DEFIBRILLATOR, ST. JUDE CURRENT (ICD-V45.02) Device parameters and data were reviewed and changes were made to the interval number for detection to try to prevent inappropriate therapy  Problem # 4:  CARDIOMYOPATHY, ISCHEMIC (ICD-414.8) as above His updated medication list for this problem includes:    Coumadin 5 Mg Tabs (Warfarin sodium) .[name]... Take as directed by coumadin clinic    Spironolactone 25 Mg Tabs (Spironolactone) .[name]... 1 tab daily    Bisoprolol Fumarate 10 Mg Tabs (Bisoprolol fumarate) .[name]... Take 1 tablet by mouth once a day    Aspirin 81 Mg Tabs (Aspirin) ..... Once daily    Losartan Potassium 50 Mg Tabs (Losartan potassium) .[name]... Take 1 tablet by mouth once a day

## 2010-10-21 NOTE — Medication Information (Signed)
Summary: ccr-lr  Anticoagulant Therapy  Managed by: Vashti Hey, RN PCP: Dr Sandrea Hughs MD: Antoine Poche MD, Fayrene Fearing Indication 1: TIA (Transient Ischemic Attack) (ICD-435.0) Lab [facility]: [facility] [place] Site: [place] INR POC 1.9  Dietary changes: no    Health status changes: no    Bleeding/hemorrhagic complications: no    Recent/future hospitalizations: no    Any changes in medication regimen? no    Recent/future dental: no  Any missed doses?: no       Is patient compliant with meds? yes       Allergies: No Known Drug Allergies  Anticoagulation Management History:      The patient is taking warfarin and comes in today for a routine follow up visit.  Positive risk factors for bleeding include history of CVA/TIA.  Negative risk factors for bleeding include an age less than 2 years old.  The bleeding index is 'intermediate risk'.  Positive CHADS2 values include Prior Stroke/CVA/TIA.  Negative CHADS2 values include Age > 25 years old.  The start date was 01/27/2005.  Anticoagulation responsible provider: Antoine Poche MD, Fayrene Fearing.  INR POC: 1.9.  Cuvette Lot#: [id number].  Exp: 10/11.    Anticoagulation Management Assessment/Plan:      The patient's current anticoagulation dose is Coumadin 5 mg tabs: Take as directed by Coumadin Clinic.  The target INR is 2 - 3.  The next INR is due 02/02/2010.  Anticoagulation instructions were given to patient.  Results were reviewed/authorized by Vashti Hey, RN.  He was notified by Vashti Hey RN.         Prior Anticoagulation Instructions: INR 2.7 Continue coumadin 5mg  once daily except 2.5mg  on M,W,F  Current Anticoagulation Instructions: INR 1.9 Take coumadin 7.5mg  tonight then resume 5mg  once daily except 2.5mg  on M,W,F

## 2010-10-21 NOTE — Progress Notes (Signed)
Summary: Med List  Med List   Imported By: Roderic Ovens 12/02/2009 12:33:34  _____________________________________________________________________  External Attachment:    Type:   Image     Comment:   External Document

## 2010-10-21 NOTE — Assessment & Plan Note (Signed)
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 His daughter was recently concerned that he had more shortness of breath. Earlier this year he reports a defibrillator discharge although this could not be confirmed by ICD interrogation. He has a known significant scar of the anterior wall. 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 The patient has remained on Coumadin. He was referred for a recent Lexiscan which showed no ischemia only anterior scar. Also an echocardiogram confirmed the ejection fraction obtained by nuclear imaging at 35%. This is essentially unchanged. The patient stated that he can go about his usual activities. He now works full-time in Phot[employer]. He is an NYHA class IIb. He denies any orthopnea PND. He is currently not on Lasix.  Preventive Screening-Counseling & Management  Alcohol-Tobacco     Smoking Status: never  Current Medications (verified): 1)  Coumadin 5 Mg Tabs (Warfarin Sodium) .... Take As Directed By Coumadin Clinic 2)  Spironolactone 25 Mg Tabs (Spironolactone) .[name].. 1 Tab Daily 3)  Crestor 20 Mg Tabs (Rosuvastatin Calcium) .... Take 1 Tab By Mouth At Bedtime 4)  Lovaza 1 Gm Caps (Omega-3-Acid Ethyl Esters) .... One By Mouth Two Times A Day 5)  Bisoprolol Fumarate 10 Mg Tabs (Bisoprolol Fumarate) .... Take 1 Tablet By Mouth Once A Day 6)  Aspirin 81 Mg Tabs (Aspirin) .... Once Daily 7)  Vitamin D 1000 Unit Tabs (Cholecalciferol) .... Take One Tablet Once Daily 8)  Losartan Potassium 50 Mg Tabs (Losartan Potassium) .... Take 1 Tablet By Mouth Once A Day 9)  Finasteride 5 Mg Tabs (Finasteride) .... Take 1 Tablet By Mouth Once A Day  Allergies (verified): No Known Drug Allergies  Comments:  Nurse/Medical Assistant: The patient is currently on medications but does  not know the name or dosage at this time. Instructed to contact our office with details. Will update medication list at that time.  Past History:  Past Medical History: Coronary artery disease with ischemic cardiomyopathy. 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 Ejection fraction 35%.  Review of Systems       The patient complains of shortness of breath.  The patient denies fatigue, malaise, fever, weight gain/loss, vision loss, decreased hearing, hoarseness, chest pain, palpitations, prolonged cough, wheezing, sleep apnea, coughing up blood, abdominal pain, blood in stool, nausea, vomiting, diarrhea, heartburn, incontinence, blood in urine, muscle weakness, joint pain, leg swelling, rash, skin lesions, headache, fainting, dizziness, depression, anxiety, enlarged lymph nodes, easy bruising or bleeding, and environmental allergies.    Vital Signs:  Patient profile:   60 year old male Height:      73 inches Weight:      223 pounds Pulse rate:   54 / minute BP sitting:   111 / 72  (left arm) Cuff size:   large  Vitals Entered By: Carlye Grippe (September 06, 2010 9:36 AM)  Physical Exam  Additional Exam:  General: Well-developed, well-nourished in no distress head: Normocephalic and atraumatic eyes PERRLA/EOMI intact, conjunctiva and lids normal nose: No  deformity or lesions mouth normal dentition, normal posterior pharynx neck: Supple, no JVD.  No masses, thyromegaly or abnormal cervical nodes lungs: Normal breath sounds bilaterally without wheezing.  Normal percussion heart: regular rate and rhythm with normal S1 and S2, no S3 or S4.  PMI is normal.  No pathological murmurs abdomen: Normal bowel sounds, abdomen is soft and nontender without masses, organomegaly or hernias noted.  No hepatosplenomegaly musculoskeletal: Back normal, normal gait muscle strength and tone normal pulsus: Pulse is normal in all 4 extremities Extremities: No peripheral pitting edema neurologic: Alert and oriented x 3 skin: Intact without lesions or rashes cervical nodes: No significant adenopathy psychologic: Normal affect     ICD Specifications Following MD:  Sherryl Manges, MD     ICD Vendor:  [employer] Jude     ICD Model Number:  [id number]     ICD Serial Number:  [id number] ICD DOI:  10/29/2008     ICD Implanting MD:  Sherryl Manges, MD  Lead 1:    Location: RV     DOI: 09/29/2000     Model #: [id number]     Serial #: [id number]     Status: active  Indications::  ICM   ICD Follow Up ICD Dependent:  No      Brady Parameters Mode VVI     Lower Rate Limit:  40      Tachy Zones VF:  240     VT:  210     VT1:  141 (MONITOR)     Impression & Recommendations:  Problem # 1:  ATRIAL FIBRILLATION (ICD-427.31) the patient remains in normal sinus rhythm as documented by his recent stress test report. He continues on Coumadin because of prior history of LV thrombus and stroke. His updated medication list for this problem includes:    Coumadin 5 Mg Tabs (Warfarin sodium) .[name]... Take as directed by coumadin clinic    Bisoprolol Fumarate 10 Mg Tabs (Bisoprolol fumarate) .[name]... Take 1 tablet by mouth once a day    Aspirin 81 Mg Tabs (Aspirin) ..... Once daily  Problem # 2:  SHORTNESS OF BREATH (ICD-786.05) the patient's functional class appears to be stable. His ejection fraction is  unchanged and stable at 35%. We will check a BNP level to make sure he does not need a small dose of a diuretic. His updated medication list for this problem includes:    Spironolactone 25 Mg Tabs (Spironolactone) .[name]... 1 tab daily    Bisoprolol Fumarate 10 Mg Tabs (Bisoprolol fumarate) .[name]... Take 1 tablet by mouth once a day    Aspirin 81 Mg Tabs (Aspirin) ..... Once daily    Losartan Potassium 50 Mg Tabs (Losartan potassium) .[name]... Take 1 tablet by mouth once a day  Orders: T-BNP  (B Natriuretic Peptide) [phone]) T-Lipid Profile [phone]) T-Hepatic Function [phone]) T-Basic Metabolic Panel ([id number]) T-CBC No Diff ([id number])  Problem # 3:  IMPLANTATION OF DEFIBRILLATOR, [employer] CURRENT (ICD-V45.02) Assessment: Comment Only  Problem # 4:  CARDIOMYOPATHY, ISCHEMIC (ICD-414.8) the patient has stable ischemic heart myopathy. Based on the Lexiscan and echocardiogram no indication for cardiac catheterization at the present time. His updated medication list for this problem includes:    Coumadin 5 Mg Tabs (Warfarin sodium) .[name]... Take as directed by coumadin clinic    Spironolactone 25 Mg Tabs (Spironolactone) .[name]... 1 tab daily  Bisoprolol Fumarate 10 Mg Tabs (Bisoprolol fumarate) .[name]... Take 1 tablet by mouth once a day    Aspirin 81 Mg Tabs (Aspirin) ..... Once daily    Losartan Potassium 50 Mg Tabs (Losartan potassium) .[name]... Take 1 tablet by mouth once a day  Problem # 5:  DYSLIPIDEMIA (ICD-272.4) we will check a lipid panel and LFTs. His updated medication list for this problem includes:    Crestor 20 Mg Tabs (Rosuvastatin calcium) .[name]... Take 1 tab by mouth at bedtime    Lovaza 1 Gm Caps (Omega-3-acid ethyl esters) ..... One by mouth two times a day  Patient Instructions: 1)  Labs:  CBC, BMET, LFT'S, FLP, BNP  2)  Follow up in  6 months

## 2010-10-21 NOTE — Letter (Signed)
Summary: Engineer, materials at [facility]  [address]   [place], [place] [address]   Phone: [phone]  Fax: [phone]        May 07, 2010 MRN: [id number]   [name] [address] [place], [place]  [address]   Dear [name],  Your test ordered by Selena Batten has been reviewed by your physician (or physician assistant) and was found to be normal or stable. Your physician (or physician assistant) felt no changes were needed at this time.  ____ Echocardiogram  ____ Cardiac Stress Test  __X__ Lab Work - labs stable, very mild renal insufficiency  ____ Peripheral vascular study of arms, legs or neck  ____ CT scan or X-ray  ____ Lung or Breathing test  __X__ Other: - follow up labs in 6 months, will send reminder in mail    Thank you.   Hoover Brunette, LPN    Duane Boston, M.D., F.A.C.C. Thressa Sheller, M.D., F.A.C.C. Oneal Grout, M.D., F.A.C.C. Cheree Ditto, M.D., F.A.C.C. Daiva Nakayama, M.D., F.A.C.C. Kenney Houseman, M.D., F.A.C.C. Jeanne Ivan, PA-C

## 2010-10-21 NOTE — Medication Information (Signed)
Summary: ccr-lr  Anticoagulant Therapy  Managed by: Vashti Hey, RN PCP: Dr Sandrea Hughs MD: Myrtis Ser MD, Tinnie Gens Indication 1: TIA (Transient Ischemic Attack) (ICD-435.0) Lab [facility] [place] Site: [place] INR POC 2.2  Dietary changes: no    Health status changes: no    Bleeding/hemorrhagic complications: no    Recent/future hospitalizations: no    Any changes in medication regimen? no    Recent/future dental: no  Any missed doses?: no       Is patient compliant with meds? yes       Allergies: No Known Drug Allergies  Anticoagulation Management History:      The patient is taking warfarin and comes in today for a routine follow up visit.  Positive risk factors for bleeding include history of CVA/TIA.  Negative risk factors for bleeding include an age less than 79 years old.  The bleeding index is 'intermediate risk'.  Positive CHADS2 values include Prior Stroke/CVA/TIA.  Negative CHADS2 values include Age > 98 years old.  The start date was 01/27/2005.  Anticoagulation responsible provider: Myrtis Ser MD, Tinnie Gens.  INR POC: 2.2.  Cuvette Lot#: [id number].  Exp: 10/11.    Anticoagulation Management Assessment/Plan:      The patient's current anticoagulation dose is Coumadin 5 mg tabs: Take as directed by Coumadin Clinic.  The target INR is 2 - 3.  The next INR is due 12/04/2009.  Anticoagulation instructions were given to patient.  Results were reviewed/authorized by Vashti Hey, RN.  He was notified by Vashti Hey RN.         Prior Anticoagulation Instructions: INR 3.7 Hold coumadin tonight then decrease dose to 5mg  once daily except 2.5mg  on M,W,F  Current Anticoagulation Instructions: INR 2.2 Continue coumadin 5mg  once daily except 2.5mg  on M,W,F

## 2010-10-21 NOTE — Medication Information (Signed)
Summary: ccr-lr  Anticoagulant Therapy  Managed by: Vashti Hey, RN PCP: Dr Sandrea Hughs MD: [name], Remi Deter Indication 1: TIA (Transient Ischemic Attack) (ICD-435.0) Lab [facility]: [facility] [place] Site: [place] INR POC 2.0  Dietary changes: no    Health status changes: no    Bleeding/hemorrhagic complications: no    Recent/future hospitalizations: no    Any changes in medication regimen? no    Recent/future dental: no  Any missed doses?: no       Is patient compliant with meds? yes       Allergies: No Known Drug Allergies  Anticoagulation Management History:      The patient is taking warfarin and comes in today for a routine follow up visit.  Positive risk factors for bleeding include history of CVA/TIA.  Negative risk factors for bleeding include an age less than 42 years old.  The bleeding index is 'intermediate risk'.  Positive CHADS2 values include Prior Stroke/CVA/TIA.  Negative CHADS2 values include Age > 73 years old.  The start date was 01/27/2005.  Anticoagulation responsible provider: [name], Remi Deter.  INR POC: 2.0.  Cuvette Lot#: [id number].  Exp: 10/11.    Anticoagulation Management Assessment/Plan:      The patient's current anticoagulation dose is Coumadin 5 mg tabs: Take as directed by Coumadin Clinic.  The target INR is 2 - 3.  The next INR is due 08/02/2010.  Anticoagulation instructions were given to patient.  Results were reviewed/authorized by Vashti Hey, RN.  He was notified by Vashti Hey RN.         Prior Anticoagulation Instructions: INR 2.4 Continue coumadin 5mg  once daily except 2.5mg  on Mondays, Wednesdays and Fridays  Current Anticoagulation Instructions: INR 2.0 Take coumadin 1 tablet tonight then resume 1 tablet once daily except 2.5mg  on Mondays, Wednesdays and Fridays

## 2010-10-21 NOTE — Assessment & Plan Note (Signed)
Summary: 4 MO   Visit Type:  Follow-up Primary Provider:  Dr Dimas Aguas   History of Present Illness: the patient's 60-year-old male with history of ischemic cardiomyopathy, history of LV thrombus and prior CVA, ejection fraction of 38%. The patient has a history of paroxysmal atrial fibrillation associated with stroke and has been compliant with anticoagulation. He's also status post ICD implant for inducible ventricular tachycardia.  The patient states he's been doing well. His cough has resolved after discontinuing his ACE inhibitor. He denies any apnea PND palpitations or syncope. From a cardiac standpoint is stable.  Preventive Screening-Counseling & Management  Alcohol-Tobacco     Smoking Status: never  Current Medications (verified): 1)  Coumadin 5 Mg Tabs (Warfarin Sodium) .... Take As Directed By Coumadin Clinic 2)  Spironolactone 25 Mg Tabs (Spironolactone) .[name].. 1 Tab Daily 3)  Crestor 20 Mg Tabs (Rosuvastatin Calcium) .... Take 1 Tab By Mouth At Bedtime 4)  Lovaza 1 Gm Caps (Omega-3-Acid Ethyl Esters) .... Take 1 Tablet By Mouth Two Times A Day 5)  Bisoprolol Fumarate 10 Mg Tabs (Bisoprolol Fumarate) .... Take 1 Tablet By Mouth Once A Day 6)  Aspirin 81 Mg Tabs (Aspirin) .... Once Daily 7)  Vitamin D 1000 Unit Tabs (Cholecalciferol) .... Take One Tablet Once Daily 8)  Losartan Potassium 50 Mg Tabs (Losartan Potassium) .... Take 1 Tablet By Mouth Once A Day 9)  Finasteride 5 Mg Tabs (Finasteride) .... Take 1 Tablet By Mouth Once A Day  Allergies (verified): No Known Drug Allergies  Comments:  Nurse/Medical Assistant: The patient's medication list and allergies were reviewed with the patient and were updated in the Medication and Allergy Lists.  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 Jude Current CD 1211  Family History: Last updated: 08/05/2008 noncontributory  Social History: Last updated: 08/05/2008 patient does not smoke or drink  Risk Factors: Smoking Status: never (04/13/2010)  Review of Systems  The patient denies fatigue, malaise, fever, weight gain/loss, vision loss, decreased hearing, hoarseness, chest pain, palpitations, shortness of breath, prolonged cough, wheezing, sleep apnea, coughing up blood, abdominal pain, blood in stool, nausea, vomiting, diarrhea, heartburn, incontinence, blood in urine, muscle weakness, joint pain, leg swelling, rash, skin lesions, headache, fainting, dizziness, depression, anxiety, enlarged lymph nodes, easy bruising or bleeding, and environmental allergies.    Vital Signs:  Patient profile:   60 year old male Height:      73 inches Weight:      220 pounds Pulse rate:   62 / minute BP sitting:   105 / 68  (left arm) Cuff size:   large  Vitals Entered By: Carlye Grippe (April 13, 2010 2:41 PM)  Physical Exam  Additional Exam:  General: Well-developed, well-nourished in no distress head: Normocephalic and atraumatic eyes PERRLA/EOMI intact, conjunctiva and lids normal nose: No deformity or lesions mouth normal dentition, normal posterior pharynx neck: Supple, no JVD.  No masses, thyromegaly or abnormal cervical nodes lungs: Normal breath sounds bilaterally without wheezing.  Normal percussion heart: regular rate and rhythm with normal  S1 and S2, no S3 or S4.  PMI is  normal.  No pathological murmurs abdomen: Normal bowel sounds, abdomen is soft and nontender without masses, organomegaly or hernias noted.  No hepatosplenomegaly musculoskeletal: Back normal, normal gait muscle strength and tone normal pulsus: Pulse is normal in all 4 extremities Extremities: No peripheral pitting edema neurologic: Alert and oriented x 3 skin: Intact without lesions or rashes cervical nodes: No significant adenopathy psychologic: Normal affect    EKG  Procedure date:  04/13/2010  Findings:      sinus bradycardia. Heart rate 54 beats per minute. Old septal infarct pattern with T-wave changes consistent with prior infarction. Low voltage QRS.   ICD Specifications Following MD:  Sherryl Manges, MD     ICD Vendor:  [facility] Jude     ICD Model Number:  [id number]     ICD Serial Number:  [id number] ICD DOI:  10/29/2008     ICD Implanting MD:  Sherryl Manges, MD  Lead 1:    Location: RV     DOI: 09/29/2000     Model #: [id number]     Serial #: [id number]     Status: active  Indications::  ICM   ICD Follow Up ICD Dependent:  No      Brady Parameters Mode VVI     Lower Rate Limit:  40      Tachy Zones VF:  240     VT:  210     VT1:  141 (MONITOR)     Impression & Recommendations:  Problem # 1:  COUGH QUESTION ACE INHIBITOR (ICD-786.2) resolved.patient currently on losartan. His updated medication list for this problem includes:    Coumadin 5 Mg Tabs (Warfarin sodium) .[name]... Take as directed by coumadin clinic    Bisoprolol Fumarate 10 Mg Tabs (Bisoprolol fumarate) .[name]... Take 1 tablet by mouth once a day    Aspirin 81 Mg Tabs (Aspirin) ..... Once daily  Problem # 2:  ATRIAL FIBRILLATION (ICD-427.31) patient remained in normal sinus rhythm. Continue anticoagulationbut I have given the patient the option  to consider dabigatran. His updated medication list for this problem includes:    Coumadin 5 Mg Tabs (Warfarin sodium) .[name]... Take as directed by coumadin clinic    Bisoprolol Fumarate 10  Mg Tabs (Bisoprolol fumarate) .[name]... Take 1 tablet by mouth once a day    Aspirin 81 Mg Tabs (Aspirin) ..... Once daily  Orders: EKG w/ Interpretation (93000) T-Basic Metabolic Panel ([id number])  Problem # 3:  CARDIOMYOPATHY, ISCHEMIC (ICD-414.8) NYHA class 1/2 symptoms. Stable continue current medical therapy. The patient did stop Lasix but his weight has remained stable. The following medications were removed from the medication list:    Furosemide 40 Mg Tabs (Furosemide) .[name]... Take 1 tablet by mouth once a day His updated medication list for this problem includes:    Coumadin 5 Mg Tabs (Warfarin sodium) .[name]... Take as directed by coumadin clinic    Spironolactone 25 Mg Tabs (Spironolactone) .[name]... 1 tab daily    Bisoprolol Fumarate 10 Mg Tabs (Bisoprolol fumarate) .[name]... Take 1 tablet by mouth once a day    Aspirin 81 Mg Tabs (Aspirin) ..... Once daily    Losartan Potassium 50 Mg Tabs (Losartan potassium) .[name]... Take 1 tablet by mouth once a day  Other Orders: T-Lipid Profile ([id number]) T-Hepatic Function [phone])  Patient Instructions: 1)  Labs:  FLP, LFT, BMET 2)  Info on Pradaxa insurance coverage 3)  Follow up in  6 months

## 2010-10-21 NOTE — Consult Note (Signed)
Summary: Consultation Report  Consultation Report   Imported By: Marylou Mccoy 10/30/2009 16:35:37  _____________________________________________________________________  External Attachment:    Type:   Image     Comment:   External Document

## 2010-10-21 NOTE — Letter (Signed)
Summary: Remote Device Check  [employer], Main Office  [address]   [place], [place] [address]   Phone: [phone]  Fax: [phone]     September 21, 2009 MRN: [id number]   VRISHANK MOSTER [address] [place], [place]  [address]   Dear Mr. SARGENT,   Your remote transmission was recieved and reviewed by your physician.  All diagnostics were within normal limits for you.    ___X___Your next office visit is scheduled for:       FEBRUARY 2011 WITH DR Graciela Husbands. Please call our office to schedule an appointment.    Sincerely,  Proofreader

## 2010-10-21 NOTE — Medication Information (Signed)
Summary: ccr-lr  Anticoagulant Therapy  Managed by: Vashti Hey, RN PCP: Dr Sandrea Hughs MD: Antoine Poche MD, Fayrene Fearing Indication 1: TIA (Transient Ischemic Attack) (ICD-435.0) Lab [facility] [place] Site: [place] INR POC 2.3  Dietary changes: no    Health status changes: no    Bleeding/hemorrhagic complications: no    Recent/future hospitalizations: no    Any changes in medication regimen? no    Recent/future dental: no  Any missed doses?: no       Is patient compliant with meds? yes       Allergies: No Known Drug Allergies  Anticoagulation Management History:      The patient is taking warfarin and comes in today for a routine follow up visit.  Positive risk factors for bleeding include history of CVA/TIA.  Negative risk factors for bleeding include an age less than 75 years old.  The bleeding index is 'intermediate risk'.  Positive CHADS2 values include Prior Stroke/CVA/TIA.  Negative CHADS2 values include Age > 52 years old.  The start date was 01/27/2005.  Anticoagulation responsible provider: Antoine Poche MD, Fayrene Fearing.  INR POC: 2.3.  Cuvette Lot#: [id number].  Exp: 10/11.    Anticoagulation Management Assessment/Plan:      The patient's current anticoagulation dose is Coumadin 5 mg tabs: Take as directed by Coumadin Clinic.  The target INR is 2 - 3.  The next INR is due 04/20/2010.  Anticoagulation instructions were given to patient.  Results were reviewed/authorized by Vashti Hey, RN.  He was notified by Vashti Hey RN.         Prior Anticoagulation Instructions: INR 2.1 Continue coumadin 5mg  once daily except 2.5mg  on M,W,F  Current Anticoagulation Instructions: INR 2.3 Continue coumadin 5mg  once daily except 2.5mg  on M,W,F

## 2010-10-21 NOTE — Medication Information (Signed)
Summary: protime/tg  Anticoagulant Therapy  Managed by: Vashti Hey, RN PCP: Dr Sandrea Hughs MD: Dietrich Pates MD, Molly Maduro Indication 1: TIA (Transient Ischemic Attack) (ICD-435.0) Lab [facility] Site: [place] INR POC 2.3  Dietary changes: no    Health status changes: no    Bleeding/hemorrhagic complications: no    Recent/future hospitalizations: no    Any changes in medication regimen? no    Recent/future dental: no  Any missed doses?: no       Is patient compliant with meds? yes       Allergies: No Known Drug Allergies  Anticoagulation Management History:      The patient is taking warfarin and comes in today for a routine follow up visit.  Positive risk factors for bleeding include history of CVA/TIA.  Negative risk factors for bleeding include an age less than 16 years old.  The bleeding index is 'intermediate risk'.  Positive CHADS2 values include Prior Stroke/CVA/TIA.  Negative CHADS2 values include Age > 14 years old.  The start date was 01/27/2005.  Anticoagulation responsible provider: Dietrich Pates MD, Molly Maduro.  INR POC: 2.3.  Cuvette Lot#: [id number].  Exp: 10/11.    Anticoagulation Management Assessment/Plan:      The patient's current anticoagulation dose is Coumadin 5 mg tabs: Take as directed by Coumadin Clinic.  The target INR is 2 - 3.  The next INR is due 09/06/2010.  Anticoagulation instructions were given to patient.  Results were reviewed/authorized by Vashti Hey, RN.  He was notified by Vashti Hey RN.         Prior Anticoagulation Instructions: INR 2.0 Take coumadin 1 tablet tonight then resume 1 tablet once daily except 2.5mg  on Mondays, Wednesdays and Fridays  Current Anticoagulation Instructions: INR 2.3 Continue coumadin 5mg  once daily except 2.5mg  on Mondays, Wednesdays and Fridays

## 2010-10-21 NOTE — Letter (Signed)
Summary: Device-Delinquent Check  [facility], Main Office  [address]   [place], [place] [address]   Phone: [phone]  Fax: [phone]     June 18, 2010 MRN: [id number]   BOOKERT GUZZI [address] [place], [place]  [address]   Dear Mr. LEREW,  According to our records, you have not had your implanted device checked in the recommended period of time.  We are unable to determine appropriate device function without checking your device on a regular basis.  Please call our office to schedule an appointment with Dr Graciela Husbands,   as soon as possible.  If you are having your device checked by another physician, please call us so that we may update our records.  Thank you, Letta Moynahan, EMT  June 18, 2010 10:08 AM   [facility]

## 2010-10-21 NOTE — Assessment & Plan Note (Signed)
Summary: 1 YR FU -SRS   Visit Type:  Follow-up Primary Provider:  Dr Dimas Aguas  CC:  follow-up visit.  History of Present Illness: the patient is a 60 year old malehistory of ischemic cardiomyopathy, history of LV thrombus, ejection fraction 38%. 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 The patient has a history of paroxysmal atrial fibrillation and is on Coumadin therapy. Recently the patient had an ICD discharge for rapid atrial fibrillation. The ICD was reprogrammed to exclude timeouts. The patient reports no recurrent discharges. He reports no chest pain.the patient reports some shortness of breath and exertion. He also has a persistent cough which has continued despite discontinuation of fosinopril. The patient has a postnasal drip and his symptoms are more consistent with upper airway cough syndrome, certainly the ACE inhibitor could have some contribution to this.  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 States his new job as been somewhat more demanding. Lipid panel was drawn and revealed a markedly elevated LDL cholesterol despite aggressive statin drug therapy at 144 mg percent. No medication changes were made at that time.  Preventive Screening-Counseling & Management  Alcohol-Tobacco     Smoking Status: never  Clinical Review Panels:  CXR CXR results  Findings: AICD device is in place with battery pack on the left.   There is slight enlargement of the cardiac silhouette.  The lungs   appears slightly hyperinflated with slight flattening of the   diaphragm on lateral image.  The lungs are free of infiltrates.  No   pleural effusions were evident.  There is minimal degenerative   spondylosis.    IMPRESSION:   AICD device in place. There is a slight overall hyperinflation   configuration.  No acute process is identified.    REF:M1 DICTATED: 10/29/2008 10:36:00    Read By:  Crawford Givens,  M.D. (10/29/2008)  Cardiac Imaging Cardiac Cath Findings CONCLUSION:  Severe single-vessel coronary artery disease with residual   nonobstructive disease elsewhere.  Severe left ventricular dysfunction.   The right heart pressures were not particularly elevated.  Of note,   there was a   dip-and-plateau waveform in the RV tracing but there was no equalization   of pressures or further evidence of restriction.      PLAN:  The patient will have elective PCI of the circumflex which could   be contributing to symptoms.               Rollene Rotunda, MD, [facility]  (06/26/2008)    Current Medications (verified): 1)  Coumadin 5 Mg Tabs (Warfarin Sodium) .... Take As Directed By Coumadin Clinic 2)  Spironolactone 25 Mg Tabs (Spironolactone) .[name].. 1 Tab Daily 3)  Crestor 20 Mg Tabs (Rosuvastatin Calcium) .... Take 1 Tab By Mouth At Bedtime 4)  Lovaza 1 Gm Caps (Omega-3-Acid Ethyl Esters) .... 4 Tablets Taken Daily.  Discontinued Over Recent Concerns Over High Cholesterol 5)  Bisoprolol Fumarate 10 Mg Tabs (Bisoprolol Fumarate) .... Take 1 Tablet By Mouth Once A Day 6)  Aspirin 81 Mg Tabs (Aspirin) .... Once Daily 7)  Vitamin D 1000 Unit Tabs (Cholecalciferol) .... Take One Tablet Once Daily 8)  Furosemide 40 Mg Tabs (Furosemide) .... Take 1 Tablet By Mouth Once A Day 9)  Cipro 500 Mg Tabs (Ciprofloxacin Hcl) .... Take 1 Tablet By Mouth Two Times A Day For  14days 10)  Losartan Potassium 50 Mg Tabs (Losartan Potassium) .... Take 1/2 Tab (25mg ) Daily, Increase To One Tab (50mg ) Daily If Systolic Bp > 100  Allergies (verified): No Known Drug Allergies  Comments:  Nurse/Medical Assistant: The patient's medications and allergies were reviewed with the patient and were updated in the Medication and Allergy Lists. List reviewed.  Past History:  Past Medical History: Last updated:  11/23/2009 Coronary artery disease with ischemic gammopathy Ejection fraction 30% with severe anterior, anteroapical, inferoapical akinesis Status post prior interval microinfarction ventricular remodeling NYHA class two Status post TCI of the circumflex coronary artery History of inducible ventricular tachycardia status post implantable cardioverter defibrillator History of paroxysmal atrial fibrillation  History of stroke on Coumadin therapy Pseudoaneurysm status post thrombin injection Right-sided AV fistula  Past Surgical History: Last updated: 11/23/2009 Bare metal stent Cardiac Cath AICD implantation-St. Jude Current CD 1211  Family History: Last updated: 08/05/2008 noncontributory  Social History: Last updated: 08/05/2008 patient does not smoke or drink  Risk Factors: Smoking Status: never (12/04/2009)  Social History: Smoking Status:  never  Review of Systems       The patient complains of fatigue, shortness of breath, and prolonged cough.  The patient denies malaise, fever, weight gain/loss, vision loss, decreased hearing, hoarseness, chest pain, palpitations, wheezing, sleep apnea, coughing up blood, abdominal pain, blood in stool, nausea, vomiting, diarrhea, heartburn, incontinence, blood in urine, muscle weakness, joint pain, leg swelling, rash, skin lesions, headache, fainting, dizziness, depression, anxiety, enlarged lymph nodes, easy bruising or bleeding, and environmental allergies.    Vital Signs:  Patient profile:   60 year old male Height:      73 inches Weight:      217 pounds Pulse rate:   88 / minute BP sitting:   106 / 74  (left arm) Cuff size:   large  Vitals Entered By: Carlye Grippe (December 04, 2009 10:59 AM) CC: follow-up visit   Physical Exam  Additional Exam:  General: Well-developed, well-nourished in no distress head: Normocephalic and atraumatic eyes PERRLA/EOMI intact, conjunctiva and lids normal nose: No deformity or  lesions mouth normal dentition, normal posterior pharynx neck: Supple, no JVD.  No masses, thyromegaly or abnormal cervical nodes lungs: Normal breath sounds bilaterally without wheezing.  Normal percussion heart: regular rate and rhythm with normal S1 and S2, no S3 or S4.  PMI is normal.  No pathological murmurs abdomen: Normal bowel sounds, abdomen is soft and nontender without masses, organomegaly or hernias noted.  No hepatosplenomegaly musculoskeletal: Back normal, normal gait muscle strength and tone normal pulsus: Pulse is normal in all 4 extremities Extremities: No peripheral pitting edema neurologic: Alert and oriented x 3 skin: Intact without lesions or rashes cervical nodes: No significant adenopathy psychologic: Normal affect    Nuclear ETT  Procedure date:  02/27/2009  Findings:       Nuclear Cardiology Conclusion : Abnormal LV perfusion.           Abnormal with Tc-80m sestamibi imaging. Stress testing induced no chest pain symptoms and a non-diagnostic ECG response. Lexiscan Global left ventricular systolic function was severely reduced, with an EF of 38%. In addition, severe global hypokinesis was present.  There was a large, fixed, apical to basal- anterior/anteroseptal defect. This defect was consistent with prior myocardial infarction. There was also a second medium, fixed, apical to mid- inferoseptal defect. This defect was consistent with prior myocardial infarction.    ICD Specifications Following MD:  Sherryl Manges, MD  ICD Vendor:  St Jude     ICD Model Number:  [id number]     ICD Serial Number:  [id number] ICD DOI:  10/29/2008     ICD Implanting MD:  Sherryl Manges, MD  Lead 1:    Location: RV     DOI: 09/29/2000     Model #: [id number]     Serial #: [id number]     Status: active  Indications::  ICM   ICD Follow Up ICD Dependent:  No      Brady Parameters Mode VVI     Lower Rate Limit:  40      Tachy Zones VF:  240     VT:  210     VT1:  141 (MONITOR)      Impression & Recommendations:  Problem # 1:  COUGH QUESTION ACE INHIBITOR (ICD-786.2) ACE inhibitor may be intermittent cough, but I suspect the patient will likely has an upper airway cough syndrome. He's been on fosinopril for a while now and his cough is persistent. He states it is improving, but during the clinic visit the patient coughed several times. I also suggested that he takes an antihistamine like Zyrtec or Allegra for the next couple of weeks in the event has upper airway cough syndrome The following medications were removed from the medication list:    Fosinopril Sodium 20 Mg Tabs (Fosinopril sodium) ..... Hold for 2 weeks His updated medication list for this problem includes:    Coumadin 5 Mg Tabs (Warfarin sodium) .[name]... Take as directed by coumadin clinic    Bisoprolol Fumarate 10 Mg Tabs (Bisoprolol fumarate) .[name]... Take 1 tablet by mouth once a day    Aspirin 81 Mg Tabs (Aspirin) ..... Once daily  Problem # 2:  FATIGUE (ICD-780.79) the Toprol was changed to bisoprolol at 10 months p.o. q. daily  Problem # 3:  ATRIAL FIBRILLATION (ICD-427.31) the patient has a prior history of paroxysmal atrial fibrillation associated with a stroke and is therefore Coumadin. He had recurrent atrial fibrillation but today in the office is in normal sinus rhythm. His updated medication list for this problem includes:    Coumadin 5 Mg Tabs (Warfarin sodium) .[name]... Take as directed by coumadin clinic    Bisoprolol Fumarate 10 Mg Tabs (Bisoprolol fumarate) .[name]... Take 1 tablet by mouth once a day    Aspirin 81 Mg Tabs (Aspirin) ..... Once daily  Orders: EKG w/ Interpretation (93000)  Problem # 4:  CVA (ICD-434.91) Assessment: Comment Only  His updated medication list for this problem includes:    Coumadin 5 Mg Tabs (Warfarin sodium) .[name]... Take as directed by coumadin clinic    Aspirin 81 Mg Tabs (Aspirin) ..... Once daily  Problem # 5:  CARDIOMYOPATHY, ISCHEMIC (ICD-414.8) we added losartan  to the patient's medical therapy at 25 mg p.o. q. daily to be increased to 50 milligrams p.o. q. daily for a blood pressure systolic greater than 100 mm of mercury. The following medications were removed from the medication list:    Fosinopril Sodium 20 Mg Tabs (Fosinopril sodium) ..... Hold for 2 weeks His updated medication list for this problem includes:    Coumadin 5 Mg Tabs (Warfarin sodium) .[name]... Take as directed by coumadin clinic    Spironolactone 25 Mg Tabs (Spironolactone) .[name]... 1 tab daily    Bisoprolol Fumarate 10 Mg Tabs (Bisoprolol fumarate) .[name]... Take 1 tablet by mouth once a day    Aspirin 81 Mg Tabs (Aspirin) ..... Once daily    Furosemide  40 Mg Tabs (Furosemide) .[name]... Take 1 tablet by mouth once a day    Losartan Potassium 50 Mg Tabs (Losartan potassium) .[name]... Take 1/2 tab (25mg ) daily, increase to one tab (50mg ) daily if systolic bp > 100  Problem # 6:  IMPLANTATION OF DEFIBRILLATOR, ST. JUDE CURRENT (ICD-V45.02) Assessment: Comment Only  Problem # 7:  DYSLIPIDEMIA (ICD-272.4) the patient has an LDL of 144 mg percent and I discontinued Vytorin and start the patient Crestor 20 milligram p.o. q.h.s. we will recheck a lipid panel in 6-8 weeks. His updated medication list for this problem includes:    Crestor 20 Mg Tabs (Rosuvastatin calcium) .[name]... Take 1 tab by mouth at bedtime    Lovaza 1 Gm Caps (Omega-3-acid ethyl esters) .Marland KitchenMarland KitchenMarland KitchenMarland Kitchen 4 tablets taken daily.  discontinued over recent concerns over high cholesterol  Patient Instructions: 1)  Stop Fosinopril 2)  Losartan 25mg  daily  3)  Increase to 50mg  daily after one week if SBP greater than 100 4)  Stop Vytorin  5)  Change to Crestor 20mg  daily  6)  Labs:  FLP / LFT in 8 weeks 7)  Stop Metoprolol 8)  Change to Bisoprolol 10mg  daily 9)  Follow up in  3-4 months Prescriptions: BISOPROLOL FUMARATE 10 MG TABS (BISOPROLOL FUMARATE) Take 1 tablet by mouth once a day  #30 x 6   Entered by:   Hoover Brunette, LPN   Authorized by:    Lewayne Bunting, MD, [facility]   Signed by:   Hoover Brunette, LPN on 16/06/9603   Method used:   Electronically to        [facility]  [address] [address] [id number] (retail)       [address]       [place], [place]  [address]       Ph: [phone] or [phone]       Fax: [phone]   RxID:   [id number]   Handout requested. CRESTOR 20 MG TABS (ROSUVASTATIN CALCIUM) Take 1 tab by mouth at bedtime  #30 x 6   Entered by:   Hoover Brunette, LPN   Authorized by:   Lewayne Bunting, MD, [facility]   Signed by:   Hoover Brunette, LPN on 40/34/7425   Method used:   Electronically to        [facility]  [address] [facility] (retail)       [address]       [place], [place]  [address]       Ph: [phone] or [phone]       Fax: [phone]   RxID:   [id number]   Handout requested. LOSARTAN POTASSIUM 50 MG TABS (LOSARTAN POTASSIUM) take 1/2 tab (25mg ) daily, increase to one tab (50mg ) daily if systolic bp > 100  #30 x 6   Entered by:   Hoover Brunette, LPN   Authorized by:   Lewayne Bunting, MD, [facility]   Signed by:   Hoover Brunette, LPN on 62/83/1517   Method used:   Electronically to        [facility]  [address] [facility]       [address]       [place], [place]  [address]       Ph: [phone] or [phone]       Fax: [phone]   RxID:  [id number]   Handout requested.

## 2010-10-21 NOTE — Letter (Signed)
Summary: External Correspondence/ OFFICE VISIT DR. HOWARD  External Correspondence/ OFFICE VISIT DR. HOWARD   Imported By: Dorise Hiss 10/30/2009 16:00:28  _____________________________________________________________________  External Attachment:    Type:   Image     Comment:   External Document

## 2010-10-21 NOTE — Progress Notes (Signed)
Summary: med question  Phone Note Call from Patient   Summary of Call: [phone] - work.  Was recently put on Finasteride 5mg  daily for enlarged prostate.  Just wants to make sure okay with other cardiac meds.   Hoover Brunette, LPN  January 04, 2010 4:37 PM   Follow-up for Phone Call        Yes should not be a problem Lewayne Bunting, MD, [facility]  January 05, 2010 2:45 PM  Patient notified.   Hoover Brunette, LPN  January 06, 2010 10:35 AM

## 2010-10-21 NOTE — Medication Information (Signed)
Summary: ccr-lr  Anticoagulant Therapy  Managed by: Vashti Hey, RN PCP: Dr Sandrea Hughs MD: Dietrich Pates MD, Molly Maduro Indication 1: TIA (Transient Ischemic Attack) (ICD-435.0) [facility] Site: [place] INR POC 2.4  Dietary changes: no    Health status changes: no    Bleeding/hemorrhagic complications: no    Recent/future hospitalizations: no    Any changes in medication regimen? no    Recent/future dental: no  Any missed doses?: no       Is patient compliant with meds? yes       Allergies: No Known Drug Allergies  Anticoagulation Management History:      The patient is taking warfarin and comes in today for a routine follow up visit.  Positive risk factors for bleeding include history of CVA/TIA.  Negative risk factors for bleeding include an age less than 62 years old.  The bleeding index is 'intermediate risk'.  Positive CHADS2 values include Prior Stroke/CVA/TIA.  Negative CHADS2 values include Age > 64 years old.  The start date was 01/27/2005.  Anticoagulation responsible provider: Dietrich Pates MD, Molly Maduro.  INR POC: 2.4.  Cuvette Lot#: [id number].  Exp: 10/11.    Anticoagulation Management Assessment/Plan:      The patient's current anticoagulation dose is Coumadin 5 mg tabs: Take as directed by Coumadin Clinic.  The target INR is 2 - 3.  The next INR is due 07/05/2010.  Anticoagulation instructions were given to patient.  Results were reviewed/authorized by Vashti Hey, RN.  He was notified by Vashti Hey RN.         Prior Anticoagulation Instructions: INR 2.3 Continue coumadin 5mg  once daily except 2.5mg  on M,W,F  Current Anticoagulation Instructions: INR 2.4 Continue coumadin 5mg  once daily except 2.5mg  on Mondays, Wednesdays and Fridays

## 2010-10-21 NOTE — Medication Information (Signed)
Summary: protime/tg  Anticoagulant Therapy  Managed by: Vashti Hey, RN PCP: Dr Sandrea Hughs MD: [name], Remi Deter Indication 1: TIA (Transient Ischemic Attack) (ICD-435.0) Lab [facility] [place] Site: [place] INR POC 2.6  Dietary changes: no    Health status changes: no    Bleeding/hemorrhagic complications: no    Recent/future hospitalizations: no    Any changes in medication regimen? no    Recent/future dental: no  Any missed doses?: no       Is patient compliant with meds? yes       Allergies: No Known Drug Allergies  Anticoagulation Management History:      The patient is taking warfarin and comes in today for a routine follow up visit.  Positive risk factors for bleeding include history of CVA/TIA.  Negative risk factors for bleeding include an age less than 45 years old.  The bleeding index is 'intermediate risk'.  Positive CHADS2 values include History of CHF and Prior Stroke/CVA/TIA.  Negative CHADS2 values include Age > 32 years old.  The start date was 01/27/2005.  Anticoagulation responsible provider: [name], Remi Deter.  INR POC: 2.6.  Cuvette Lot#: [id number].  Exp: 10/11.    Anticoagulation Management Assessment/Plan:      The patient's current anticoagulation dose is Coumadin 5 mg tabs: Take as directed by Coumadin Clinic.  The target INR is 2 - 3.  The next INR is due 11/03/2010.  Anticoagulation instructions were given to patient.  Results were reviewed/authorized by Vashti Hey, RN.  He was notified by Vashti Hey RN.         Prior Anticoagulation Instructions: INR 2.3 Continue coumadin 5mg  once daily except 2.5mg  on Mondays, Wednesdays and Fridays  Current Anticoagulation Instructions: INR 2.6 Continue coumadin 5mg  once daily except 2.5mg  on Mondays, Wednesdays and fridays

## 2010-10-21 NOTE — Letter (Signed)
Summary: Custom - Delinquent Coumadin 1  [facility]  [address]   [place], [place] [address]   Phone: [phone]  Fax: [phone]     June 02, 2010 MRN: [id number]   [name] [address] [place], [place]  [address]   Dear [name],  This letter is being sent to you as a reminder that it is necessary for you to get your INR/PT checked regularly so that we can optimize your care.  Our records indicate that you were scheduled to have a test done recently.  As of today, we have not received the results of this test.  It is very important that you have your INR checked.  Please call our office at the number listed above to schedule an appointment at your earliest convenience.    If you have recently had your protime checked or have discontinued this medication, please contact our office at the above phone number to clarify this issue.  Thank you for this prompt attention to this important health care matter.  Sincerely, Vashti Hey RN  [facility] Cardiovascular Risk Reduction Clinic Team

## 2010-10-21 NOTE — Medication Information (Signed)
Summary: ccr-lr  Anticoagulant Therapy  Managed by: Vashti Hey, RN PCP: Dr Sandrea Hughs MD: Myrtis Ser MD, Tinnie Gens Indication 1: TIA (Transient Ischemic Attack) (ICD-435.0) Lab [facility] [place] Site: [place] INR POC 2.7  Dietary changes: no    Health status changes: no    Bleeding/hemorrhagic complications: no    Recent/future hospitalizations: no    Any changes in medication regimen? yes       Details: started on cipro 11/24/09 by Dr Rito Ehrlich.  Should finish on 11/11/09  Recent/future dental: no  Any missed doses?: no       Is patient compliant with meds? yes       Allergies: No Known Drug Allergies  Anticoagulation Management History:      The patient is taking warfarin and comes in today for a routine follow up visit.  Positive risk factors for bleeding include history of CVA/TIA.  Negative risk factors for bleeding include an age less than 77 years old.  The bleeding index is 'intermediate risk'.  Positive CHADS2 values include Prior Stroke/CVA/TIA.  Negative CHADS2 values include Age > 69 years old.  The start date was 01/27/2005.  Anticoagulation responsible provider: Myrtis Ser MD, Tinnie Gens.  INR POC: 2.7.  Cuvette Lot#: [id number].  Exp: 10/11.    Anticoagulation Management Assessment/Plan:      The patient's current anticoagulation dose is Coumadin 5 mg tabs: Take as directed by Coumadin Clinic.  The target INR is 2 - 3.  The next INR is due 01/01/2010.  Anticoagulation instructions were given to patient.  Results were reviewed/authorized by Vashti Hey, RN.  He was notified by Vashti Hey RN.         Prior Anticoagulation Instructions: INR 2.2 Continue coumadin 5mg  once daily except 2.5mg  on M,W,F  Current Anticoagulation Instructions: INR 2.7 Continue coumadin 5mg  once daily except 2.5mg  on M,W,F

## 2010-10-21 NOTE — Cardiovascular Report (Signed)
Summary: Office Visit Remote   Office Visit Remote   Imported By: Roderic Ovens 09/24/2009 12:29:25  _____________________________________________________________________  External Attachment:    Type:   Image     Comment:   External Document

## 2010-10-21 NOTE — Letter (Signed)
Summary: Engineer, materials at [facility]  [address]   [place], [place] [address]   Phone: [phone]  Fax: [phone]        September 16, 2010 MRN: [id number]   [name] [address] [place], [place]  [address]   Dear Mr. DIKES,  Your test ordered by Selena Batten has been reviewed by your physician (or physician assistant) and was found to be normal or stable. Your physician (or physician assistant) felt no changes were needed at this time.  ____ Echocardiogram  ____ Cardiac Stress Test  __X__ Lab Work - lipid panel at goal (LDL 74)  ____ Peripheral vascular study of arms, legs or neck  ____ CT scan or X-ray  ____ Lung or Breathing test  ____ Other:   Thank you.   Hoover Brunette, LPN    Duane Boston, M.D., F.A.C.C. Thressa Sheller, M.D., F.A.C.C. Oneal Grout, M.D., F.A.C.C. Cheree Ditto, M.D., F.A.C.C. Daiva Nakayama, M.D., F.A.C.C. Kenney Houseman, M.D., F.A.C.C. Jeanne Ivan, PA-C

## 2010-10-21 NOTE — Procedures (Signed)
Summary: defib ck/mt   Current Medications (verified): 1)  Coumadin 5 Mg Tabs (Warfarin Sodium) .... Take As Directed By Coumadin Clinic 2)  Spironolactone 25 Mg Tabs (Spironolactone) .[name].. 1 Tab Daily 3)  Crestor 20 Mg Tabs (Rosuvastatin Calcium) .... Take 1 Tab By Mouth At Bedtime 4)  Lovaza 1 Gm Caps (Omega-3-Acid Ethyl Esters) .... One By Mouth Two Times A Day 5)  Bisoprolol Fumarate 10 Mg Tabs (Bisoprolol Fumarate) .... Take 1 Tablet By Mouth Once A Day 6)  Aspirin 81 Mg Tabs (Aspirin) .... Once Daily 7)  Vitamin D 1000 Unit Tabs (Cholecalciferol) .... Take One Tablet Once Daily 8)  Losartan Potassium 50 Mg Tabs (Losartan Potassium) .... Take 1 Tablet By Mouth Once A Day 9)  Finasteride 5 Mg Tabs (Finasteride) .... Take 1 Tablet By Mouth Once A Day  Allergies (verified): No Known Drug Allergies   ICD Specifications Following MD:  Sherryl Manges, MD     ICD Vendor:  [employer]     ICD Model Number:  ZO1096-04     ICD Serial Number:  [id number] ICD DOI:  10/29/2008     ICD Implanting MD:  Sherryl Manges, MD  Lead 1:    Location: RV     DOI: 09/29/2000     Model #: 1914     Serial #: [id number]     Status: active  Indications::  ICM   ICD Follow Up Remote Check?  No Battery Voltage:  3.17 V     Charge Time:  12.0 seconds     Battery Est. Longevity:  6.6 years Underlying rhythm:  A-fib ICD Dependent:  No       ICD Device Measurements Right Ventricle:  Amplitude: 11.4 mV, Impedance: 580 ohms, Threshold: 0.75 V at 0.5 msec Shock Impedance: 46 ohms   Episodes Shock:  0     Nonsustained:  18     Ventricular Pacing:  2.1%  Brady Parameters Mode VVI     Lower Rate Limit:  40      Tachy Zones VF:  240     VT:  210     VT1:  141 (MONITOR)     Next Cardiology Appt Due:  09/19/2010 Tech Comments:  VT1 episodes appear A-fib with RVR, will discuss with Dr. Graciela Husbands.   Altha Harm, LPN  July 14, 2010 5:01 PM

## 2010-10-21 NOTE — Progress Notes (Signed)
Summary: started on antibiotics  Phone Note Other Incoming   Caller: Dr Rito Ehrlich Summary of Call: Dr Rito Ehrlich called and spoke with Dr Andee Lineman.  He has started pt on cipro x 2 weeks.  Pt is also on coumadin.  When is INR check? Initial call taken by: Vashti Hey RN,  November 24, 2009 1:00 PM  Follow-up for Phone Call        Pt is scheduled for INR on 12/04/09.  Called pt and left message we will plan to check INR on 3/18 which will be 1/2 way thru corse of antibiotics.  If he has bleeding or increased bruising he is to call before then. Follow-up by: Vashti Hey RN,  November 24, 2009 1:02 PM

## 2010-10-27 ENCOUNTER — Encounter: Payer: Self-pay | Admitting: Cardiology

## 2010-11-03 ENCOUNTER — Encounter: Payer: Self-pay | Admitting: Cardiology

## 2010-11-03 ENCOUNTER — Encounter (INDEPENDENT_AMBULATORY_CARE_PROVIDER_SITE_OTHER): Payer: BC Managed Care – PPO

## 2010-11-03 DIAGNOSIS — Z7901 Long term (current) use of anticoagulants: Secondary | ICD-10-CM

## 2010-11-03 DIAGNOSIS — G450 Vertebro-basilar artery syndrome: Secondary | ICD-10-CM

## 2010-11-03 LAB — CONVERTED CEMR LAB: POC INR: 2

## 2010-11-04 NOTE — Letter (Signed)
Summary: External Correspondence/ [facility]  External Correspondence/ [facility]   Imported By: Dorise Hiss 10/29/2010 16:16:22  _____________________________________________________________________  External Attachment:    Type:   Image     Comment:   External Document

## 2010-11-10 NOTE — Medication Information (Signed)
Summary: CCR-LR/TR  Anticoagulant Therapy  Managed by: Marcus Hey, RN PCP: Dr Sandrea Hughs MD: [name], Remi Deter Indication 1: TIA (Transient Ischemic Attack) (ICD-435.0) Lab Used: [facility] Site: [facility] INR POC 2.0  Dietary changes: no    Health status changes: yes       Details: head cold  Bleeding/hemorrhagic complications: no    Recent/future hospitalizations: no    Any changes in medication regimen? yes       Details: started on z pack yesterday  Recent/future dental: no  Any missed doses?: no       Is patient compliant with meds? yes       Allergies: No Known Drug Allergies  Anticoagulation Management History:      The patient is taking warfarin and comes in today for a routine follow up visit.  Positive risk factors for bleeding include history of CVA/TIA.  Negative risk factors for bleeding include an age less than 6 years old.  The bleeding index is 'intermediate risk'.  Positive CHADS2 values include History of CHF and Prior Stroke/CVA/TIA.  Negative CHADS2 values include Age > 41 years old.  The start date was 01/27/2005.  Anticoagulation responsible provider: [name], Remi Deter.  INR POC: 2.0.  Cuvette Lot#: [id number].  Exp: 10/11.    Anticoagulation Management Assessment/Plan:      The patient's current anticoagulation dose is Coumadin 5 mg tabs: Take as directed by Coumadin Clinic.  The target INR is 2 - 3.  The next INR is due 12/01/2010.  Anticoagulation instructions were given to patient.  Results were reviewed/authorized by Marcus Hey, RN.  He was notified by Marcus Hey RN.         Prior Anticoagulation Instructions: INR 2.6 Continue coumadin 5mg  once daily except 2.5mg  on Mondays, Wednesdays and fridays  Current Anticoagulation Instructions: INR 2.0 Continue coumadin 5mg  once daily except 2.5mg  on Mondays, Wednesdays and Fridays

## 2010-12-02 ENCOUNTER — Encounter: Payer: Self-pay | Admitting: Cardiology

## 2010-12-02 ENCOUNTER — Encounter (INDEPENDENT_AMBULATORY_CARE_PROVIDER_SITE_OTHER): Payer: BC Managed Care – PPO

## 2010-12-02 DIAGNOSIS — Z7901 Long term (current) use of anticoagulants: Secondary | ICD-10-CM

## 2010-12-02 DIAGNOSIS — G450 Vertebro-basilar artery syndrome: Secondary | ICD-10-CM

## 2010-12-02 LAB — CONVERTED CEMR LAB: POC INR: 2.1

## 2010-12-07 NOTE — Medication Information (Signed)
Summary: ccr-lr  Anticoagulant Therapy  Managed by: Vashti Hey, RN PCP: Dr Sandrea Hughs MD: Dietrich Pates MD, Molly Maduro Indication 1: TIA (Transient Ischemic Attack) (ICD-435.0) Lab Used: [facility] Site: [facility] INR POC 2.1  Dietary changes: no    Health status changes: no    Bleeding/hemorrhagic complications: no    Recent/future hospitalizations: no    Any changes in medication regimen? no    Recent/future dental: no  Any missed doses?: no       Is patient compliant with meds? yes       Allergies: No Known Drug Allergies  Anticoagulation Management History:      The patient is taking warfarin and comes in today for a routine follow up visit.  Positive risk factors for bleeding include history of CVA/TIA.  Negative risk factors for bleeding include an age less than 26 years old.  The bleeding index is 'intermediate risk'.  Positive CHADS2 values include History of CHF and Prior Stroke/CVA/TIA.  Negative CHADS2 values include Age > 46 years old.  The start date was 01/27/2005.  Anticoagulation responsible provider: Dietrich Pates MD, Molly Maduro.  INR POC: 2.1.  Cuvette Lot#: [id number].  Exp: 10/11.    Anticoagulation Management Assessment/Plan:      The patient's current anticoagulation dose is Coumadin 5 mg tabs: Take as directed by Coumadin Clinic.  The target INR is 2 - 3.  The next INR is due 12/16/2010.  Anticoagulation instructions were given to patient.  Results were reviewed/authorized by Vashti Hey, RN.  He was notified by Vashti Hey RN.         Prior Anticoagulation Instructions: INR 2.0 Continue coumadin 5mg  once daily except 2.5mg  on Mondays, Wednesdays and Fridays  Current Anticoagulation Instructions: INR 2.1 Continue coumadin 5mg  once daily except 2.5mg  on Mondays, Wednesdays and Fridays Pt scheduled for colonoscopy on 12/06/10.  Dr Gabriel Cirri told him to stop coumadin 3 days before procedure.  After procedure he will take coumadin 5mg  x 2 then resume above  dose.

## 2010-12-15 ENCOUNTER — Other Ambulatory Visit: Payer: Self-pay | Admitting: *Deleted

## 2010-12-15 DIAGNOSIS — I2589 Other forms of chronic ischemic heart disease: Secondary | ICD-10-CM

## 2010-12-15 MED ORDER — BISOPROLOL FUMARATE 10 MG PO TABS: 10.0000 mg | ORAL_TABLET | Freq: Every day | ORAL | Status: DC

## 2010-12-16 ENCOUNTER — Ambulatory Visit (INDEPENDENT_AMBULATORY_CARE_PROVIDER_SITE_OTHER): Payer: BC Managed Care – PPO | Admitting: *Deleted

## 2010-12-16 DIAGNOSIS — Z7901 Long term (current) use of anticoagulants: Secondary | ICD-10-CM

## 2010-12-16 DIAGNOSIS — I6350 Cerebral infarction due to unspecified occlusion or stenosis of unspecified cerebral artery: Secondary | ICD-10-CM

## 2010-12-16 DIAGNOSIS — I4891 Unspecified atrial fibrillation: Secondary | ICD-10-CM

## 2011-01-03 ENCOUNTER — Other Ambulatory Visit: Payer: Self-pay | Admitting: *Deleted

## 2011-01-03 MED ORDER — ROSUVASTATIN CALCIUM 20 MG PO TABS: 20.0000 mg | ORAL_TABLET | Freq: Every day | ORAL | Status: DC

## 2011-01-03 MED ORDER — SPIRONOLACTONE 25 MG PO TABS: 25.0000 mg | ORAL_TABLET | Freq: Every day | ORAL | Status: DC

## 2011-01-03 MED ORDER — ROSUVASTATIN CALCIUM 20 MG PO TABS: 400.0000 mg | ORAL_TABLET | Freq: Every day | ORAL | Status: DC

## 2011-01-03 MED ORDER — LOSARTAN POTASSIUM 50 MG PO TABS: 50.0000 mg | ORAL_TABLET | Freq: Every day | ORAL | Status: DC

## 2011-01-04 LAB — BASIC METABOLIC PANEL
BUN: 19 mg/dL (ref 6–23)
Chloride: 105 mEq/L (ref 96–112)
Glucose, Bld: 96 mg/dL (ref 70–99)
Potassium: 4.3 mEq/L (ref 3.5–5.1)
Sodium: 137 mEq/L (ref 135–145)

## 2011-01-13 ENCOUNTER — Ambulatory Visit (INDEPENDENT_AMBULATORY_CARE_PROVIDER_SITE_OTHER): Payer: BC Managed Care – PPO | Admitting: *Deleted

## 2011-01-13 DIAGNOSIS — I6350 Cerebral infarction due to unspecified occlusion or stenosis of unspecified cerebral artery: Secondary | ICD-10-CM

## 2011-01-13 DIAGNOSIS — I4891 Unspecified atrial fibrillation: Secondary | ICD-10-CM

## 2011-01-13 DIAGNOSIS — Z7901 Long term (current) use of anticoagulants: Secondary | ICD-10-CM

## 2011-01-13 LAB — POCT INR: INR: 2.3

## 2011-02-01 NOTE — Assessment & Plan Note (Signed)
[facility]                          [facility] OFFICE NOTE   NAME:Smith, Marcus BERNS                     MRN:          [id number]  DATE:07/01/2008                            DOB:          1951-07-06    CARDIOLOGIST:  Dr. Lewayne Bunting.   PRIMARY CARE PHYSICIAN:  Dr. Selinda Flavin.   REASON FOR VISIT:  Left groin pain status post catheterization.   HISTORY OF PRESENT ILLNESS:  [name] is a very pleasant 60 year old  male patient with a history of ischemic cardiomyopathy s/p AICD  implantation, who recently was evaluated by Dr. Andee Lineman with complaints  of dyspnea on exertion.  He was set up for cardiac catheterization as an  outpatient.  This demonstrated severe disease in the circumflex and he  was set up for elective PCI.  He underwent bare-metal stenting to the  circumflex on June 26, 2008, and was discharged home June 27, 2008.  Of note, he was taken off of his Coumadin and bridged with Lovenox  therapy secondary to a history of stroke in the setting of paroxysmal  atrial fibrillation.  Prior to discharge, the patient developed a  hematoma in the left groin.  This was reduced with manual pressure.  He  was discharged home on Coumadin, Plavix, and aspirin.  Bridging Lovenox  was not given secondary to the hematoma formation.  He was in the office  today for a check of his INR when he noted to the nurse that he was  having significant left groin pain.  He was added on to the scheduled  for further evaluation.  He denies any chest pain or shortness of  breath.  He denies any syncope.   CURRENT MEDICATIONS:  1. Aspirin 81 mg daily.  2. Coumadin as directed.  3. Spirolactone 25 mg daily.  4. Vytorin 10/80 mg daily.  5. Fosinopril 20 mg daily.  6. Fish oil q.h.s.  7. Plavix 75 mg daily.  8. Toprol XL 50 mg daily.   PHYSICAL EXAM:  He is a well-nourished, well-developed male in no acute  distress.  Blood pressure is 102/67.  Pulse 88.   Weight 221 pounds.  HEENT:  Normal.  NECK:  Without JVD.  CARDIAC:  Normal S1 and S2.  Regular rate and rhythm.  LUNGS:  Clear to auscultation bilaterally.  EXTREMITIES:  Without edema.  VASCULAR EXAM:  Right femoral arteriotomy site without hematoma but  positive bruit is noted.  Left femoral arteriotomy site is notable for  extensive ecchymosis, as well as a fairly large hematoma overlying the  left groin.  No bruit is auscultated at this time.  ABDOMEN:  Soft, nontender.   ASSESSMENT AND PLAN:  1. Left groin hematoma status post recent cardiac catheterization, as      well as right groin bruit.  The patient is on dual antiplatelet      therapy, as well as Coumadin therapy.  He was not placed on      bridging Lovenox secondary to hematoma formation prior to      discharge, but is still at high risk for bleeding.  I discussed      this case today with Dr. Diona Browner.  We will send him over for an      abdominal and pelvic CT without contrast to rule out the      possibility of retroperitoneal bleeding or other deep hematoma.  He      will also have a stat CBC checked today.  He will have an      ultrasound done of his right groin today to rule out      pseudoaneurysm.  I will be in touch with Dr. Andee Lineman today as well,      his primary cardiologist, to ascertain which anticoagulant should      be held at this time.  I suspect we will likely hold his Coumadin      while he is on aspirin and Plavix.  We will contact the patient      once I have spoken to Dr. Andee Lineman and we see what his studies show      regarding the next step.  2. Ischemic cardiomyopathy with an EF of 30%, s/p AICD implantation.      He is overall stable.  3. Coronary disease status post recent stenting.  As noted above, he      will need to maintain Plavix and aspirin therapy for a minimum of      30 days.  4. Paroxysmal atrial fibrillation on coumadin therapy.  As noted, we      will decide how to proceed with his  coumadin therapy once Dr.      Andee Lineman is apprised of the situation.   ADDENDUM:  The CT demonstrated a large hematoma localized to the left  groin with no evidence of retroperitoneal extension.  He has a  pseudoaneurysm of the left femoral artery as well.  There was an AV  fistula demonstrated by doppler in the right groin.  I spoke to Dr.  Tonny Bollman in [place].  The patient will be contacted tomorrow  morning by our team at [facility] to arrange thrombin injection of the  left pseudoaneurysm and further evaluation of the right AV fistula.      Tereso Newcomer, PA-C  Electronically Signed      Jonelle Sidle, MD  Electronically Signed   SW/MedQ  DD: 07/01/2008  DT: 07/01/2008  Job #: [id number]   cc:   Selinda Flavin

## 2011-02-01 NOTE — Discharge Summary (Signed)
NAME:  [name], [name] NO.:  [id number]   MEDICAL RECORD NO.:  [id number]          PATIENT TYPE:  INP   LOCATION:  6527                         FACILITY:  [facility]   PHYSICIAN:  Veverly Fells. Excell Seltzer, MD  DATE OF BIRTH:  [birth year]   DATE OF ADMISSION:  06/26/2008  DATE OF DISCHARGE:  06/27/2008                               DISCHARGE SUMMARY   PRIMARY CARDIOLOGIST:  Learta Codding, MD, [facility].   DISCHARGE DIAGNOSIS:  Unstable angina.   SECONDARY DIAGNOSES:  1. Coronary artery disease.  Status post percutaneous coronary      intervention and stenting of the mid left circumflex with a 3.0 x      50 mm Vision bare metal stent during this admission.  2. Chronic systolic congestive heart failure.  3. Ischemic cardiomyopathy.  EF of 30% with severe anteroapical and      inferoapical akinesis.  4. Status post implantable cardioverter-defibrillator implantation.  5. History of paroxysmal atrial fibrillation on chronic Coumadin      therapy.  6. Status post cerebrovascular accident.  7. Hyperlipidemia.   ALLERGIES:  No known drug allergies.   PROCEDURES:  Left heart cardiac catheterization with successful PCI and  stenting of the mid left circumflex with placement of a 3.0 x 50 mm  Vision bare metal stent.   HISTORY OF PRESENT ILLNESS:  A 60 year old male with prior history of  CAD, status post large anterior wall MI who recently saw Dr. Earnestine Leys in  Clinic on June 17, 2008.  The patient reported a several-month  history of dyspnea on exertion as well as dizziness and chest pressure.  It was decided the patient would require a left heart cardiac  catheterization to further evaluate coronary anatomy.   HOSPITAL COURSE:  The patient presented to the Cath Lab in February  2009, and underwent right and left heart cardiac catheterization.  Right  heart pressures were within normal limits with a cardiac output of 5.7  and an index of 2.6.   Diagnostic coronary angiography  revealed an 80% stenosis in the mid left  circumflex but otherwise nonobstructive lesion.  EF was 30% with severe  anteroapical and inferoapical akinesis.  Films were reviewed with Dr.  Tonny Bollman.  Decision was made to pursue PCI of the mid left  circumflex.  Circumflex was successfully stented with 3.0 x 50 mm Vision  bare metal stent.  The patient tolerated the procedure well and this  morning while sitting up developed a hematoma at the left groin site.  Manual pressure was maintained with reduction of hematoma and he  remained on bed rest for an additional 2 hours.  Post bed rest, he has  been ambulating without recurrence of the left groin hematoma.  There  was no evidence of bruits or bleeding.  Because of his development of  the hematoma, we elected at this point not to bridge him back to  Coumadin with Lovenox and therefore we will discharge him home today on  Coumadin only.   DISCHARGE LABORATORY DATA:  Hemoglobin 12.8, hematocrit 38.6, WBC 5.3,  platelets 113, MCV 95.3,  sodium 136, potassium 4.4, chloride 103, CO2  28, BUN 13, creatinine 1.1, serum glucose 144, INR 1.0, and calcium 8.7.   DISPOSITION:  The patient will be discharged home today in good  condition.   FOLLOWUP PLANS AND APPOINTMENTS:  We have arranged followup with Dr.  Andee Lineman on July 17, 2008, at 1:30 p.m.  We also arranged for Coumadin  Clinic followup.   DISCHARGE MEDICATIONS:  1. Aspirin 81 mg daily.  2. Plavix 75 mg daily x30 days.  3. Coumadin 5 mg as previously directed.  4. Spironolactone 25 mg daily.  5. Vytorin 10/80 mg nightly.  6. Toprol-XL 50 mg daily.  7. Fish oil 1000 mg daily.  8. Lisinopril 20 mg daily.  9. Nitroglycerin 0.4 mg sublingual p.r.n. chest pain.   OUTSTANDING LAB STUDIES:  The patient will have an INR next week.   DURATION OF DISCHARGE/ENCOUNTER:  Sixty minutes including physician  time.      Nicolasa Ducking, ANP      Veverly Fells. Excell Seltzer, MD   Electronically Signed    CB/MEDQ  D:  06/27/2008  T:  06/28/2008  Job:  [id number]

## 2011-02-01 NOTE — Assessment & Plan Note (Signed)
[facility]                          [facility] NOTE   NAME:Gobert, [name]                     MRN:          [id number]  DATE:06/27/2007                            DOB:          1950-10-02    REFERRING PHYSICIAN:  Dr. Selinda Flavin.   HISTORY OF PRESENT ILLNESS:  The patient is a 60 year old male, retired  Psychologist, occupational, with a history of ischemic cardiomyopathy. His ejection fraction  is 35% to 40%. The patient was recently seen by Dr. Graciela Husbands for a  defibrillator discharge. This turned out to be atrial fibrillation. The  defibrillator was interrogated and re-programmed. Toprol was increased  to 100 mg daily, however, due to slow heart rate documented by Dr.  Dimas Aguas, this was decreased back to 75 mg p.o. daily. The patient has  done well with this. He states that he has had no fatigue or weakness.  He denies any substernal chest pain. He had no recurrent palpitations.   MEDICATIONS:  1. Enteric coated aspirin 81 mg daily.  2. Coumadin as directed.  3. Monopril 20 mg daily.  4. Spironolactone 25 mg daily.  5. Vytorin 10/80 mg daily.  6. Toprol 75 mg daily.   PHYSICAL EXAMINATION:  VITAL SIGNS:  Blood pressure 110/74, heart rate  69, weight 222.  NECK:  Normal carotid upstroke, no carotid bruits.  LUNGS:  Clear breath sounds bilaterally.  HEART:  Regular rate and rhythm. Normal S1 and S2. No murmurs, rubs, or  gallops.  ABDOMEN:  Soft and nontender. No rebound or guarding. Good bowel sounds.  EXTREMITIES:  No cyanosis, clubbing, or edema.  NEUROLOGIC:  The patient is alert and oriented and grossly non-focal.   PROBLEM LIST:  1. Ischemic cardiomyopathy.      a.     Ejection fraction 35% to 40%.      b.     Status post anterior wall myocardial infarction.      c.     NYHA class 1.      d.     Negative Cardiolite study earlier this year.  2. History of inducible ventricular tachycardia.      a.     Status post ICD.      b.     Status  post recent episode of atrial fibrillation withanti-       tachycardia pacing and discharge.  3. Paroxysmal atrial fibrillation maintaining normal sinus rhythm.  4. Chronic Coumadin therapy.  5. Dyslipidemia.  6. Paresthesia.   PLAN:  1. The patient is doing well. He can continue on his current medical      regimen. He denies any substernal chest pain.  2. We reviewed to the patient his labs today. His lipid panel is      within goal and he also has no evidence of elevated liver function      test.  3. The patient can follow up with [place] in six months.     Learta Codding, MD,FACC  Electronically Signed    GED/MedQ  DD: 06/27/2007  DT: 06/28/2007  Job #: [id number]   cc:   Caryn Bee  Dimas Aguas

## 2011-02-01 NOTE — Op Note (Signed)
NAME:  [name], [name]              ACCOUNT NO.:  [id number]   MEDICAL RECORD NO.:  [id number]         PATIENT TYPE:  OIB   LOCATION:  2010                         FACILITY:  [facility]   PHYSICIAN:  Veverly Fells. Excell Seltzer, MD  DATE OF BIRTH:  1951/07/18   DATE OF PROCEDURE:  07/03/2008  DATE OF DISCHARGE:                               OPERATIVE REPORT   PROCEDURE:  Left common femoral artery percutaneous thrombin injection.   INDICATION:  [name] underwent diagnostic catheterization and  subsequent PCI last week.  The right groin was accessed for the  diagnostic procedure, and the left groin was accessed for the PCI.  The  patient has also been on aspirin, Plavix, and Coumadin.  He developed a  large hematoma in the left groin.  Bilateral groin ultrasound was  performed earlier this week and demonstrated a hematoma with a large 3-  cm diameter common femoral pseudoaneurysm on the left as well as an AV  fistula on the right.  I elected to bring him in for percutaneous  thrombin injection of the left common femoral artery pseudoaneurysm.  I  discussed this case with Dr. Edilia Bo in Vascular Surgery regarding his  AV fistula.  Since the patient is asymptomatic and has no right groin  pain or lower extremity edema, we will manage him conservatively as an  initial approach.  We will plan on serial ultrasonography.   Risks and indications of the procedure were reviewed with the patient.  Informed consent was obtained.  Left groin was prepped and draped under  normal sterile conditions.  Lidocaine 2% was used for topical  anesthesia.  The patient was given 6 mg of intravenous morphine.  Under  ultrasound guidance, a Chiba echo needle was used and the tip was  visualized within the pseudoaneurysm.  A small amount of less than 1 mL  of diluted 5000 units thrombin was injected into the pseudoaneurysm with  successful thrombosis and obliteration of the pseudoaneurysm.  The  common femoral and  posterior tibial wave forms remained brisk with  excellent flow.  The patient tolerated the procedure well.  There were  no immediate complications.   CONCLUSIONS:  Successful percutaneous thrombin injection of left common  femoral pseudoaneurysm.   RECOMMENDATIONS:  Recommend follow up ultrasound in 1 week of both left  and right groins to reassess his treated pseudoaneurysm on the left and  AV fistula on the right.      Veverly Fells. Excell Seltzer, MD  Electronically Signed     MDC/MEDQ  D:  07/03/2008  T:  07/03/2008  Job:  [id number]   cc:   Learta Codding, MD,FACC  Tereso Newcomer, PA-C

## 2011-02-01 NOTE — Assessment & Plan Note (Signed)
[facility]                          [facility]   NAME:Reaser, EYOEL THROGMORTON                     MRN:          [id number]  DATE:07/08/2008                            DOB:          11/27/1950    HISTORY OF PRESENT ILLNESS:  The patient is a 60 year old male with a  history of ischemic cardiomyopathy status post ICD implant.  He has a  known prior anterior wall myocardial infarction.  He also has a history  of stroke in the setting of paroxysmal atrial fibrillation and  significant LV remodeling.  However, post catheterization, the patient  developed a left-sided pseudoaneurysm and a right-sided AV fistula.  The  patient had thrombin injection done last week by Dr. Excell Seltzer.  He has  less pain in the left leg now.  He will have a Doppler study rescheduled  for this Thursday.  He denies any chest pain.  His blood pressure is on  the low side today and has experienced some dizziness.   MEDICATIONS:  1. Spironolactone 25 mg p.o. daily.  2. Coumadin as directed.  3. Plavix 75 mg p.o. daily.  4. Fosinopril 20 mg p.o. daily.  5. Metoprolol ER 50 mg daily.  6. Vytorin 10/80 mg p.o. daily.  7. Furosemide 20 mg a day.  8. Aspirin 81 mg a day.  9. Fish oil.   PHYSICAL EXAMINATION:  VITAL SIGNS:  Blood pressure is 98/55, heart rate  is 85, and weight is 222 pounds.  NECK:  Normal carotid stroke and no carotid bruits.  LUNGS:  Clear breath sounds bilaterally.  HEART:  Regular rate and rhythm.  Normal S1 and S2.  No murmur, rubs, or  gallop.  ABDOMEN:  Soft and nontender.  No rebound or guarding.  Good bowel  sounds.  EXTREMITIES:  Descending hematoma in the left leg with old blood.  There  is still swelling and tenderness in the left groin, but no bruits heard.  There is a systolic-diastolic bruit heard in the right groin consistent  with AV fistula.   PROBLEM LIST:  1. Coronary artery disease with ischemic cardiomyopathy.      a.      Ejection fraction 30% with severe anterior, anteroapical,       and inferoapical akinesis.      b.     Status post anterior wall myocardial infarction.      c.     New York Heart Association class IIB.  2. History of inducible ventricular tachycardia.      a.     Status post implantable cardioverter-defibrillator.      b.     Status post inappropriate implantable cardioverter-       defibrillator discharge secondary to atrial fibrillation.  3. Paroxysmal atrial fibrillation, currently in sinus rhythm.  4. History of stroke, on Coumadin therapy.  5. Left pseudoaneurysm status post thrombin injection.  6. Right-sided arteriovenous fistula.   PLAN:  1. I have cut back on the patient's fosinopril for 1 week and hold his      Lasix today given his orthostasis likely related to recent blood  loss.  The patient is experiencing some slight dizziness.  2. The patient has been rescheduled for Thursday for a repeat Doppler      study.  3. The plan has been outlaid regarding the right-sided AV fistula by      Dr. Excell Seltzer and the surgeon.  I think for now, we will proceed with      medical treatment.  4. The patient has been restarted on Coumadin therapy.  I told him      that given the fact that he had received a recent bare-metal stent      to the circumflex coronary artery, he can stop his Plavix in 1      month.  Which will be on July 27, 2008.  5. The patient is otherwise doing well and we will closely follow him      and see him back in 1 month.     Learta Codding, MD,FACC  Electronically Signed    GED/MedQ  DD: 07/08/2008  DT: 07/09/2008  Job #: [id number]

## 2011-02-01 NOTE — Assessment & Plan Note (Signed)
[facility]                          [facility]   NAME:[name], [name]                     MRN:          [id number]  DATE:03/27/2007                            DOB:          1951-01-31    HISTORY OF PRESENT ILLNESS:  The patient is a 60 year old male with a  history of coronary artery disease status post prior anterior wall  myocardial infarction.  The patient was last seen in the office no January 01, 2007.  A followup echocardiogram was ordered and this demonstrated  an improved ejection fraction to 35-40%.  The patient was doing well.  He was hemodynamically stable and had no chest pain or shortness of  breath.  The patient states that, last weekend, he was working out in  the yard and shoveling some mulch.  He got very hot and then he felt the  temperature was up to 110 Fahrenheit.  He felt tired and decided to eat  a sandwich.  Shortly after this, he had a sensation of a feeling a live  wire in his hand.  He presumed that he had 2 counter-shocks from his  device.  The patient presented to the emergency room and was seen with  Dr. Mellody Dance.  Was also seen by Dr. __________, admitted, and ruled out for  myocardial infarction.  Defibrillator interrogation was performed, which  demonstrated, per the patient's report, 2 episodes of antitachycardia  pacing, Joey with the device company contacted Dr. Graciela Husbands regarding these  findings.  Toprol was increased from 50 to 75 mg a day.  The patient was  also continued on Monopril, aldactone, Vytorin, enteric-coated aspirin,  as well as Coumadin.   He stated that he has been doing well.  He has had no counter-shocks or  sensations of palpitations.   MEDICATIONS:  Listed above.   PHYSICAL EXAMINATION:  VITAL SIGNS:  Blood pressure 114/65, heart rate  60, weight 221 pounds.  NECK:  Normal carotid upstroke.  No carotid bruits.  LUNGS:  Clear breath sounds bilaterally.  HEART:  Regular rate and  rhythm.  Normal S1, S2.  No murmurs, rubs, or  gallops.  ABDOMEN:  Soft.  EXTREMITIES:  No cyanosis, clubbing, or edema.   PROBLEM LIST:  1. Status post electrophysiology study with ventricular tachycardia.  2. Status post implantable cardioverter defibrillator implant.      a.     Status post antitachycardia pacing x2.      b.     Ruled out for myocardial infarction.  3. Ischemic cardiomyopathy.      a.     Status post anterior wall myocardial infarction.      b.     Ejection fraction 25-35%, more recently 35-40%.  4. Status post catheterization in 2006 with patent stent to the LAD.  5. 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  6. Dyslipidemia.   PLAN:  1. The patient will proceed with adenosine Cardiolite study to rule      out ischemia as a trigger to his anti-tachycardia pacing.  2. I agree with increasing Toprol XL to 75 mg a day.  The patient      states he appears to be tolerating this well.  3. Will have a PT INR done in the office after his recent Coumadin      adjustment.  4. The patient also has an appointment with Dr. Graciela Husbands next Friday.      Further recommendations will be given at that time if any change in      device settings will need to be made.     Learta Codding, MD,FACC  Electronically Signed    GED/MedQ  DD: 03/27/2007  DT: 03/27/2007  Job #: [id number]

## 2011-02-01 NOTE — Letter (Signed)
February 20, 2009    Selinda Flavin, MD  [address], [address]  [place], [place] [address]   RE:  AZEL, GUMINA  MRN:  [id number]  /  DOB:  28-Feb-1951   Dear Caryn Bee,   I hope this letter finds you well.  [name] comes in today in  followup for an ICD implanted for nonsustained VT with inducible VT in  the state of ischemic heart disease, prior myocardial infarction, and  depressed left ventricular function.  It is my recollection that he has  had intercurrent therapy that was inappropriate, but no appropriate  therapy.   He had progressive dyspnea on exertion this fall and underwent  catheterization under the direction of Dr. Andee Lineman.  He underwent  catheterization in the fall of 2009 because of dyspnea on exertion.  At  that time, a patent LAD stent was found as well as an 80% lesion in the  circumflex system.  There is mild stenosis in the RCA system.  He comes  in today with complaints of exertionally associated right chest,  shoulder, and neck discomfort that relieves with rest.   There is some residual discomfort that does not totally resolve at rest.   His current medications include spironolactone, Coumadin, lisinopril,  metoprolol, Vytorin 10/80, aspirin, and furosemide.   PHYSICAL EXAMINATION:  VITAL SIGNS:  His blood pressure was 117/71.  His  pulse was 66.  His weight was 221 which was stable.  NECK:  Veins were flat.  LUNGS:  Clear.  HEART:  Sounds were regular without murmurs or gallops.  ABDOMEN:  Soft.  EXTREMITIES:  Without edema.  GENERAL:  He was alert.  He is in no acute distress.  SKIN:  Warm and dry.   Interrogation of his St. Jude defibrillator changed out in February  2010, demonstrated R-wave of 11 with impedance of 660, a threshold of  0.75 at 0.4.  Battery voltage is 3.20.   IMPRESSION:  1. Ischemic heart disease with      a.     prior stenting of his left anterior descending in 1995,       prior stenting of his circumflex in 1998 with a  catheterization as       noted above in 2009.  2. Depressed left ventricular function with class II symptoms.  3. 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  4. History of paroxysmal atrial fibrillation with a rapid ventricular      response resulting in inappropriate therapy.  5. Chronic Coumadin for the above.  6. New-onset right-sided chest and neck pain.   Mr. Emmett, Bracknell has chest discomfort that is aggravated by exertion.  It may well be musculoskeletal, but with his pretest probability and his  moderate disease as he had before, I think it is reasonable and  hopefully not an invitation for a false positive to undertake Myoview  scanning.   It will need to be decided with Dr. Andee Lineman afterwards as to whether the  event that is positive catheterization is appropriate or not.   Thanks very much for allowing [place] to participate in this care.    Sincerely,      Duke Salvia, MD, [facility] [place]  Electronically Signed    SCK/MedQ  DD: 02/20/2009  DT: 02/21/2009  Job #: [id number]   CC:  Learta Codding, MD,FACC

## 2011-02-01 NOTE — Assessment & Plan Note (Signed)
[facility]                          [facility]   NAME:[name], [name]                     MRN:          [id number]  DATE:06/17/2008                            DOB:          September 26, 1950    REFERRING PHYSICIAN:  Selinda Flavin   REFERRING PHYSICIAN:  __________.   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  The patient has a history of  prior large anterior wall myocardial infarction with ejection fraction  of 35-40%.  He also is on the chronic Coumadin for a prior history of  stroke.  The patient is also followed closely by Dr. Graciela Husbands after ICD  implant.  The patient states that over the last few months he has been  complaining of dyspnea on exertion and also dizziness.  He states that  he is to be able to walk half a mile without much difficulties, but now  this causing extreme shortness of breath.  He feels he has slowed down  significantly.  He also reports some atypical chest pressure.   MEDICATIONS:  1. Aspirin 81 mg daily.  2. Coumadin 5 mg as directed.  3. Spironolactone 25 mg a day.  4. Vytorin 10/80 mg a day.  5. Toprol 75 mg p.o. daily.  6. Lisinopril.  7. Fish oil.   PHYSICAL EXAMINATION:  VITAL SIGNS:  Blood pressure 109/76, heart rate  is 56, and weight is 220 pounds.  NECK:  Normal carotid upstroke.  No carotid bruits.  LUNGS:  Clear breath sounds bilaterally.  HEART:  Regular rate and rhythm.  Normal S1 and S2.  No murmurs, rubs,  or gallops.  ABDOMEN:  Soft and nontender.  No rebound or guarding.  Good bowel  sounds.  EXTREMITIES:  No cyanosis, clubbing, or edema.   ICD pocket appears within normal limits.   LABORATORY WORK:  BNP is 62.  Cholesterol panel; LDL is 64 with an HDL  of 43 and total cholesterol of 135.  Potassium is 4.6.  CO2 is 33.  Creatinine is 1.2.  Glucose is 110.  Direct bilirubin is 1.2.  INR is  1.9.  INR of 1.9 is  actually dated on June 19, 2008.  Hemoglobin 15.6,  white count 6.3, and platelet count is 149.   Of note, the patient has been bridged with Lovenox due to his prior  history of stroke and apical thrombus.   PROBLEM LIST:  1. Ischemic cardiomyopathy.      a.     Ejection fraction of 35-40%.      b.     Status post anterior wall myocardial infarction.      c.     New York Heart Association class IIB with worsening symptoms       of dyspnea and chest pressure.      d.     Negative Cardiolite study in 2008.  2. History of inducible ventricular tachycardia .      a.     Status post implantable cardioverter-defibrillator.      b.  Status post inappropriate implantable cardioverter-       defibrillator discharge secondary to atrial fibrillation.  3. Paroxysmal atrial fibrillation, currently in sinus bradycardia by      EKG in the office.  Chronic Coumadin therapy.  4. Dyslipidemia.  5. Paresthesia.   PLAN:  1. The patient clearly has a change in his symptomatology.  His      exercise tolerance and stamina has decreased.  He also reports some      chest pressure.  There is a clear change from his last visit.  I      have told the patient, I think we should proceed with a repeat      cardiac catheterization as he could have worsened disease.  2. The patient, because of high risk and high [place] score, will be      bridged with Lovenox after Coumadin has been discontinued in      preparation for catheterization.  3. I discussed the risk and benefits of the diagnostic catheterization      with the patient.  He is willing to proceed.Learta Codding, MD,FACC  Electronically Signed    GED/MedQ  DD: 06/25/2008  DT: 06/26/2008  Job #: [id number]   cc:   Selinda Flavin

## 2011-02-01 NOTE — Assessment & Plan Note (Signed)
[facility]                          [facility]   NAME:[name], [name]                     MRN:          [id number]  DATE:05/02/2007                            DOB:          [birth year]    CARDIOLOGIST:  [name],FACC.   ELECTROPHYSIOLOGIST:  [name], [facility].   PRIMARY CARE PHYSICIAN:  [name], M.D.   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  He  recently saw [name] after being admitted with ICD discharge.  He was  noted to have new onset atrial fibrillation with RVR which spontaneously  converted to sinus rhythm.  He was ruled out for myocardial infarction.  His Toprol was up-titrated.  He saw [name] as well, back in follow-  up.  It was felt that patient was shocked secondary to RVR from his  atrial fibrillation.  [name] did make some adjustments in his  thresholds.  His Toprol was also increased again from 75 to 100 mg a  day.  When [name] saw him in early July, he set him up for a  Cardiolite study to rule out the possibility of ischemia.  This returned  revealing large anterior apical and large periapical defect which was  nonreversible, consistent with prior scar but no definite ischemia and  an EF of 34%.  Of note, the patient did have an echocardiogram  in April  2008, that revealed an EF of 35-40%.  He returns today for follow-up.  He notes he is doing well overall.  Denies any chest pain or shortness  of breath.  He describes NYHA Class I symptoms.  He denies any syncope  or near syncope.  Denies any significant fatigue.  He does, however,  note a complaint of occasional paresthesias.  This can sometimes occur  in his right or left leg.  It can also sometimes occur in his hands.  It  does not seem to be related to positioning.  He denies any pain but has  had some cramping.  He denies  any symptoms consistent with claudication.   CURRENT MEDICATIONS:  1. Aspirin 81 mg daily.  2. Coumadin as directed.  3. Monopril 20 mg daily.  4. Spironolactone 25 mg daily.  5. Vytorin 10/80 mg daily.  6. Toprol XL 100 mg daily.  7. Vitegra vitamin.   ALLERGIES:  NO KNOWN DRUG ALLERGIES.   PHYSICAL EXAMINATION:  VITAL SIGNS:  Blood pressure 108/69, pulse 47,  weight 220 pounds.  GENERAL APPEARANCE:  He is a well-developed, well-nourished male in no  acute distress.  HEENT:  Normal.  NECK:  Without JVD.  LUNGS:  Clear to auscultation bilaterally.  CARDIOVASCULAR:  Normal S1 and S2.  Regular rate and rhythm.  ABDOMEN:  Soft and nontender with normal active bowel sounds, no  organomegaly.  EXTREMITIES:  Without edema.  Calves soft and nontender.  SKIN:  Warm and dry.  NEUROLOGIC:  He is alert and oriented x3.  Cranial nerves II-XII grossly  intact.   Electrocardiogram revealed sinus bradycardia with a heart rate of 49, no  significant change since previous tracings.   IMPRESSION:  1. Ischemic cardiomyopathy with an ejection fraction of 35-40%,      treated with percutaneous coronary intervention to the left      anterior descending.      a.     Previous ejection fraction 25-35%.      b.     Status post anterior wall myocardial infarction.      c.     New York Heart Association Class I symptoms.      d.     History of stenting to the circumflex in 1998.  2. History of inducible ventricular tachycardia, status post automatic      implanted cardioverter defibrillator implantation.      a.     Recent automatic implanted cardioverter defibrillator       discharge as well as antitachycardia pacing in the setting of       rapid atrial fibrillation.  3. Recent nonischemic Cardiolite study.  4. Paroxysmal atrial fibrillation with rapid ventricular response.      a.     Maintaining sinus rhythm.  5. Chronic Coumadin therapy.  6. Dyslipidemia.  7. Paresthesias.   PLAN:  Patient  presents to the office today for follow-up.  As noted  above, his Cardiolite study was nonischemic.  No further work-up is  warranted at this time.   He does, however, describe some paresthesias.  These have been noted  over the last three months and seem to be persistent.  I have  recommended some lab work to begin the work-up.  He also needs follow-up  on his lipids.  Therefore will check fasting lipids, CMET, CBC, TSH, B12  and folate.  If the above testing returns normal and he continues to  have symptoms, then will refer him to neurology for further work-up.   Otherwise, will follow up with [name] as scheduled and see [name]  back in his office in four to six weeks.      Marcus Newcomer, PA-C  Electronically Signed      Marcus Sidle, Smith  Electronically Signed   SW/MedQ  DD: 05/02/2007  DT: 05/03/2007  Job #: [id number]   cc:   [name]

## 2011-02-01 NOTE — Discharge Summary (Signed)
NAME:  AVIN, GIBBONS NO.:  [id number]   MEDICAL RECORD NO.:  [id number]           PATIENT TYPE:   LOCATION:                                 FACILITY:   PHYSICIAN:  Duke Salvia, MD, FACCDATE OF BIRTH:  1951/05/05   DATE OF ADMISSION:  DATE OF DISCHARGE:                               DISCHARGE SUMMARY   The time for this dictation greater than 35 minutes.   This patient has no known drug allergies.   FINAL DIAGNOSES:  1. Implantable cardioverter-defibrillator generator is at elective      replacement indicator.  2. Implanted on October 29, 2008, St. Jude CURRENT Plus VR single-      chamber cardioverter-defibrillator, Dr. Sherryl Manges.  The patient      discharging the same day.   SECONDARY DIAGNOSES:  1. History of anterior myocardial infarction at age 52 with a stent      placed in the left anterior descending at that time.      a.     Anginal equivalents were dizziness and severe back pain.  2. Ischemic cardiomyopathy, ejection fraction of less than 35%.      a.     Recent catheterization in October 2009 for progressive       dyspnea on exertion, and at catheterization, there was a severe       mid left circumflex stenosis, which received a bare-metal stent.       The patient was on Plavix and aspirin for 30 days, also at       catheterization the stent in the left anterior descending was       patent and the right coronary artery was patent.  3. Stent to the left anterior descending in 1994.  4. Stent to left circumflex on October 07, 1996.  5. History of cerebrovascular accident with complete resolution of      symptoms.  6. Electrophysiology study in November 2001 for nonsustained      ventricular tachycardia/ischemic cardiomyopathy/ejection fraction      less than 35%.      a.     Study showed inducible polymorphic ventricular tachycardia       but no inducible sustained monomorphic ventricular tachycardia.       Polymorphic ventricular  tachycardia is not considered high risk       based on work from the [employer] at [facility].  7. Implant of a Guidant Prizm single-chamber implantable cardioverter-      defibrillator in January 2002 for ventricular tachycardia/ischemic      cardiomyopathy and inducible sustained monomorphic ventricular      tachycardia.  8. Dyslipidemia.  9. New York Heart Association Class II congestive heart failure.   PROCEDURES:  October 29, 2008, explant of existing Guidant Prizm single-  chamber implantable cardioverter-defibrillator with implant of a St.  Jude CURRENT Plus VR single-chamber implantable cardioverter-  defibrillator.   The patient is asked to remove his bandage on the morning of Thursday,  October 30, 2008, and leave the incision open to the air.  He is to  keep his incision  dry for the next 7 days, to sponge bathe until  Wednesday, November 05, 2008.   MEDICATIONS AT DISCHARGE:  1. Spironolactone 25 mg daily.  2. Enteric-coated aspirin 81 mg daily.  3. Coumadin as he has been taking prior to this admission.  4. Fosinopril 20 mg daily.  5. Metoprolol ER 50 mg daily.  6. Vytorin 10/80 daily each evening.  7. Lovaza 1 g twice daily.  8. Vitamin D 1000 International Units twice daily.   He follows up at [facility] [address] in  [place] on Monday, November 17, 2008, at 9 o'clock at [facility].  He  has an appointment with Dr. Andee Lineman on Wednesday, December 10, 2008, at  1:45.  The patient also has a prescription for furosemide 40 mg tablets.  He says he is not currently taking them because they do disrupt his work  schedule.  He says that it is so effective that he urinates about once  every hour.  He has kept these tablets and will talk with Dr. Andee Lineman  about this.  However, I have recommended he weigh himself daily and use  the Lasix currently only if his weight increases by 3-4 pounds over the  next 36-hour period.  He has very well compensated  congestive heart  failure.  Runs and bicycles.  He is very active gentleman.   LABORATORY STUDIES:  Pertinent to this admission were drawn on October 22, 2008, white cells 6.4, hemoglobin 14.9, hematocrit 44.4, platelets  are 152.  Serum electrolytes, sodium 134, potassium 4.4, chloride 99,  carbonate 26, BUN is 17, creatinine 1.1.  Pro time 20.5.  INR is 1.7.      Maple Mirza, [place]      Duke Salvia, MD, [facility]  Electronically Signed    GM/MEDQ  D:  10/29/2008  T:  10/30/2008  Job:  [id number]   cc:   Learta Codding, MD,FACC  Selinda Flavin

## 2011-02-01 NOTE — Cardiovascular Report (Signed)
[facility]                   [place] ELECTROPHYSIOLOG[facility] DEVICE CLINIC NOTE   NAME:[name], [name]                     MRN:          [id number]  DATE:09/07/2007                            DOB:          1951-07-07    [name] was seen following ICD discharge.  He was evaluated at  [facility].  He was found to have had atrial fibrillation with  rapid ventricular response with heart rates in the low 200s.  Because of  that he entered his VF zone and was shocked.  He had a divertive shock  about 4 minutes prior to this.  He had no antecedent symptoms.   In June of this year he was also shocked for atrial fibrillation.  Both  of these events occur in the context of mild dehydration, the first  because he is out working, the second because he was having diarrhea.   MEDICATIONS:  Notable for metoprolol, down-titrated from 175 because of  resting bradycardia.  He is also on aspirin, Coumadin, Aldactone,  Monopril, and Vytorin.   PHYSICAL EXAMINATION:  VITAL SIGNS:  On examination today his blood  pressure was 99/60 with a pulse of 59.  LUNGS:  Clear.  HEART:  His heart sounds were regular, and extremities were without  edema.  SKIN:  His skin is warm and dry.   Interrogation of his guide in ICD demonstrates an R-wave of 13.0 with  impedance of 766, with thresholds of 0.6 and 0.4.   His device was reprogrammed to create a monitoring zone.  The VT zone  was programmed at 210-240, as there are no discriminators in this zone.  Will plan to see him again in 2 month's time to reassess.  I have also  suggested that we undertake atrial fibrillation rhythm management,  probably using Tikosyn so as to avoid the need for further rate  controlling medications.  Plus I think it is more effective than  sotalol.  He understands.  Will plan to get a stress Myoview as been he  has been having complaints of increasing shortness of breath with  exertion.  This may  be related to Toprol or intercurrent deterioration  of his coronary artery disease and ischemic heart disease.   Will see me again in 2 months.     Duke Salvia, MD, [facility]  Electronically Signed    SCK/MedQ  DD: 09/07/2007  DT: 09/08/2007  Job #: [id number]   cc:   Selinda Flavin

## 2011-02-01 NOTE — Assessment & Plan Note (Signed)
[facility]ALTHCARE                          [place] CARDIOLOGY OFFICE NOTE   NAME:[name], [name]                     MRN:          [id number]  DATE:12/11/2008                            DOB:          20-Jun-1951    REFERRING PHYSICIAN:  Dr. Selinda Flavin.   HISTORY OF PRESENT ILLNESS:  The patient presents for followup after a  right groin AV fistula.  This remains present.  On clinical exam, he  continues to have a continuous murmur.  He had his Doppler ultrasound  done today, but I do not have the official results yet.  From a cardiac  standpoint, however, he is doing quite well and he denies any shortness  of breath, orthopnea, PND, palpitations, or syncope.   MEDICATIONS:  1. Spironolactone 25 mg a day.  2. Coumadin 5 mg daily.  3. Fosinopril 20 mg daily.  4. Metoprolol ER 50 mg p.o. daily.  5. Vytorin 10/80 mg p.o. daily.  6. Aspirin 81 mg p.o. daily.  7. Fish oil daily.   PHYSICAL EXAMINATION:  VITAL SIGNS:  Blood pressure 103/68, heart rate  62, weight 224 pounds.  HEENT:  Pupils; eyes are clear.  Conjunctivae are clear.  NECK:  Supple.  Normal carotid upstroke, no carotid bruits.  LUNGS:  Clear breath sounds bilaterally.  HEART:  Regular rate and rhythm.  Normal S1 and S2.  ABDOMEN:  Soft.  EXTREMITIES:  No cyanosis, clubbing or edema.  Right groin has bruit.   PROBLEM LIST:  1. Ischemic cardiomyopathy, ejection fraction 30%.  2. Severe single-vessel coronary artery disease.  3. History of inducible ventricular tachycardia status post      implantable cardioverter-defibrillator.  4. Persistent atrial fibrillation on chronic Coumadin therapy.   PLAN:  We will await for the official results of the ultrasound of AV  fistula.  The patient has no evidence of high-output heart failure.  We  will continue with the conservative management.    Learta Codding, MD,FACC  Electronically Signed   GED/MedQ  DD: 12/11/2008  DT: 12/12/2008  Job #:  [id number]   cc:   Selinda Flavin

## 2011-02-01 NOTE — Assessment & Plan Note (Signed)
[facility]                         ELECTROPHYSIOLOGY OFFICE NOTE   NAME:Nestor, HERMANN DOTTAVIO                     MRN:          [id number]  DATE:09/23/2008                            DOB:          09/26/1950    Mr. Buckner is seen in followup of an ICD implanted for primary  prevention, which has been associated with inappropriate therapy.  My  note from April 2009 also mentions ventricular tachycardia, but I do not  recall the details of appropriate therapy.  He was implanted initially  as part of the MADO-1 algorithm that was nonsustained VT, depressed EF,  and inducible sustained myocardial infarction.  He has ischemic heart  disease and recently underwent revascularization with stenting to his  circumflex.   He has had no intercurrent problems with chest pain or shortness of  breath.   MEDICATIONS:  1. Spironolactone 25.  2. Coumadin.  3. Lisinopril 20.  4. Metoprolol 50.  5. Vytorin 10/80.  6. Lasix 40.  7. Aspirin 81.   PHYSICAL EXAMINATION:  VITAL SIGNS:  Blood pressure is 120/78, pulse was  54, his weight was 224, which is up just a little bit.  LUNGS:  Clear.  HEART:  Sounds were regular.  NECK:  Veins were flat.  ABDOMEN:  Soft.  EXTREMITIES:  Without edema.  NEUROLOGIC:  Grossly normal.   Interrogation of his ICD demonstrates that he has reached ERI.  He has a  Guidant single-chamber device with an impedance of about 780.  High-  voltage impedance of 15 and R-wave of 17 or so.   IMPRESSION:  1. 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  2. Ischemic heart disease with;      a.     Prior myocardial infarction.      b.     Recent stenting.      c.     Depressed left ventricular function.  3. Atrial fibrillation with a rapid ventricular response resulting in      inappropriate therapy.  4. Device - Guidant, now at elective replacement indicator.   Mr. Callicott device will need to  be explanted and new device implanted.  We have discussed potential benefits as well as potential risks  including but not limited to infection and the need for new lead  placement and the associated risks there of.  We will plan to schedule  this at his convenience.  Preoperative laboratories will be necessary.     Duke Salvia, MD, [facility]  Electronically Signed    SCK/MedQ  DD: 09/23/2008  DT: 09/24/2008  Job #: [id number]

## 2011-02-01 NOTE — Cardiovascular Report (Signed)
NAME:  [name], FULL NO.:  [id number]   MEDICAL RECORD NO.:  [id number]          PATIENT TYPE:  OIB   LOCATION:  1965                         FACILITY:  [facility]   PHYSICIAN:  Rollene Rotunda, MD, FACCDATE OF BIRTH:  1951/01/16   DATE OF PROCEDURE:  06/26/2008  DATE OF DISCHARGE:                            CARDIAC CATHETERIZATION   PRIMARY CARE PHYSICIAN:  Selinda Flavin, MD   CARDIOLOGIST:  Learta Codding, MD, [facility]   PROCEDURE:  Left and right heart catheterization/coronary arteriography.   INDICATIONS:  This is a patient with progressive dyspnea.  He had a  known ischemic cardiomyopathy with previous ejection fraction of about  35%.  He had had an anterior MI with stenting.   PROCEDURE:  Left heart catheterization was performed via the right  femoral artery, right heart catheterization was performed via the right  femoral vein.  Both vessels were cannulated using the anterior wall  puncture.  A #4-French arterial sheath and a #7-French venous sheath  were inserted via the modified Seldinger technique.  Preformed Judkins  and a pigtail catheter were utilized as well as a Swan-Ganz catheter.  The patient tolerated the procedure well and left the lab in stable  condition.   RESULTS:  Hemodynamics:  LV 102/13, AO 97/58, pulmonary capillary wedge  pressure mean 7, pulmonary artery 19/6 with a mean of 11, RV 20/70,  right atrial pressure mean of 3, cardiac output/cardiac index (Fick)  5.7/2.6.   Coronaries:  The left main was normal.  The LAD had proximal diffuse  luminal irregularities.  There was a proximal stent which was widely  patent with some mild luminal irregularities.  The vessel had a 25-30%  calcified segment.  The vessel wrapped the apex.  The circumflex in the  AV groove had moderate calcification.  There was proximal 30% stenosis.  There was a mid long 80% stenosis before and leading into a large mid  obtuse marginal.  The remainder of this vessel was  free of disease.  There was a ramus intermediate which was very large and normal.  The  right coronary artery was a dominant vessel though not particularly  large.  It had a long mid 40% stenosis.  PDA was small and normal.  Posterolateral was small and normal.   Left ventriculogram:  The left ventriculogram was obtained in the RAO  projection.  The EF was 30% with severe anterior, anteroapical, and  inferoapical akinesis.   CONCLUSION:  Severe single-vessel coronary artery disease with residual  nonobstructive disease elsewhere.  Severe left ventricular dysfunction.  The right heart pressures were not particularly elevated.  Of note,  there was a  dip-and-plateau waveform in the RV tracing but there was no equalization  of pressures or further evidence of restriction.   PLAN:  The patient will have elective PCI of the circumflex which could  be contributing to symptoms.      Rollene Rotunda, MD, [facility]  Electronically Signed     JH/MEDQ  D:  06/26/2008  T:  06/26/2008  Job:  [id number]   cc:   Frutoso Schatz  DeGent, MD,FACC

## 2011-02-01 NOTE — Op Note (Signed)
NAME:  [name], [name] NO.:  [id number]   MEDICAL RECORD NO.:  [id number]          PATIENT TYPE:  OIB   LOCATION:  2899                         FACILITY:  [facility]   PHYSICIAN:  Duke Salvia, MD, FACCDATE OF BIRTH:  11-Oct-1950   DATE OF PROCEDURE:  10/29/2008  DATE OF DISCHARGE:  10/29/2008                               OPERATIVE REPORT   PREOPERATIVE DIAGNOSIS:  Previously implanted implantable cardioverter-  defibrillator, now at end of life.   POSTOPERATIVE DIAGNOSIS:  Previously implanted implantable cardioverter-  defibrillator, now at end of life; insulation barrier break through the  rate sense portion.  Procedure is explantation of previously implanted  device, implantation of new device, lead repair, and pocket revision  with excavation of the floor of pocket.   Following obtaining informed consent, the patient was brought to the  electrophysiology laboratory, placed on the fluoroscopic table in supine  position.  After routine prep and drape of the left upper chest,  lidocaine was infiltrated along the line of the previous incision and  carried down to layer of the device pocket.  Using sharp dissection and  electrocautery, the pocket was opened.  It was noted that the lead was  on top of the can and great care was given to this.   The device was explanted.  Interrogation of the previously implanted RV  lead, serial number [id number], model Guidant 0148 demonstrated an R-wave of  16.8.  Proximal coil impedance was 780, distal coil impedance was 744.   With these parameters secured, heme was noted in the rate sense portion  of the defibrillator lead and medical adhesive was placed upon its  entire length down to the yoke.   The lead was freed up from the pocket and then the floor of the pocket  was excavated and the pocket was extended about 2 cm caudally to allow  the housing of the much larger St. Jude pulse generator.  The leads were  then attached to  a St. Jude current VR pulse generator model [id number],  serial number [id number] and through the device, the bipolar R wave 11.7  with a pace impedance of 630, a threshold of 0.5 volts at 0.5  milliseconds and high-voltage impedance was 45 ohms.   Defibrillation threshold testing was undertaken.  Ventricular  fibrillation was induced via the T-wave shock.  After a total duration  of 6.5 seconds, a 15-joule shock was delivered through a measured  resistance of 45 ohms terminating ventricular fibrillation and restoring  sinus rhythm.  The pocket was copiously irrigated with antibiotic  containing saline solution.  Hemostasis was assured.  Surgicel was  placed on the posterior and anterior aspect of the pocket as well as on  the cephalad ridge and the device was secured to the prepectoral fascia.  The wound was closed in three layers in the normal fashion.  The wound  was washed, dried, and a benzoin Steri-Strip dressing was applied.  Needle counts, sponge counts, and instrument counts were correct at the  end of procedure according to the staff.  The patient tolerated the  procedure without  apparent complication.     Duke Salvia, MD, [facility]  Electronically Signed    SCK/MEDQ  D:  10/29/2008  T:  10/30/2008  Job:  [id number]

## 2011-02-01 NOTE — Assessment & Plan Note (Signed)
[facility]ALTHCARE                          [place] C[facility] OFFICE NOTE   NAME:[name], [name]                     MRN:          [id number]  DATE:09/03/2008                            DOB:          June 19, 1951    PRIMARY CARDIOLOGIST:  Learta Codding, MD, [facility].   PRIMARY ELECTROPHYSIOLOGIST:  Duke Salvia, MD, [facility] [place].   REASON FOR VISIT:  Scheduled followup.  Please refer to Dr. Margarita Mail  recent office note of August 05, 2008, for complete details.   Clinically, [name] continues to do quite well since undergoing  percutaneous intervention of a high-grade circumflex lesion in early  October.  He denies any exertional angina pectoris or significant  dyspnea.  He denies any orthopnea, PND, or lower extremity edema.  He  has been experiencing some discomfort in the right thigh, however, as  well as a tingling sensation in the lower leg.  This is currently being  evaluated as possible sciatic pain.   With respect to his previously documented right groin AV fistula, this  remains persistent by a recent followup study on August 29, 2008.  It  measured approximately 1.3 cm in length.  The left groin pseudoaneurysm,  treated with thrombin injection, remained occluded.   CURRENT MEDICATIONS:  1. Spironolactone 25 daily.  2. Coumadin 5 mg, as directed.  3. Lisinopril 20 daily.  4. Metoprolol ER 50 daily.  5. Vytorin 10/80 daily.  6. Furosemide 40 daily.  7. Aspirin 81 daily.  8. Fish oil daily.   PHYSICAL EXAMINATION:  VITAL SIGNS:  Blood pressure 113/70, pulse 68 and  regular, weight 223 (up 1).  GENERAL:  A 60 year old male sitting upright in no distress.  HEENT:  Normocephalic, atraumatic.  NECK:  Palpable carotid pulses without bruits; no JVD.  LUNGS:  Clear to auscultation in all fields.  HEART:  Regular rate and rhythm.  No significant murmurs.  ABDOMEN:  Benign.  EXTREMITIES:  Groin examined an absent for any residual ecchymosis.  Palpable bilateral femoral pulses with soft, nonpansystolic bruit in the  right.  No bruit on the left.  Both peripheral pulses are intact, with  no edema.  No focal deficit.   IMPRESSION:  1. Ischemic cardiomyopathy.      a.     EF 30%.          I. NYHA class II symptoms.  2. Severe single-vessel CAD.      a.     Status post bare-metal stenting of high-grade mid circumflex       stenosis, October 2009.      b.     Complicated by right groin arteriovenous fistula and left       groin pseudoaneurysm, the latter successfully treated with       thrombin injection.  3. History of inducible ventricular tachycardia.      a.     Status post implantable cardioverter-defibrillator       implantation.  4. History of paroxysmal atrial fibrillation.  5. Chronic Coumadin.  6. Hyperlipidemia.   PLAN:  1. Continue current medication regimen.  2. Schedule  a followup right groin ultrasound for continued monitoring      of the AV fistula in 3 months.  3. Schedule return clinic followup with myself and Dr. Andee Lineman in 6      months.      Gene Serpe, PA-C  Electronically Signed      Learta Codding, MD,FACC  Electronically Signed   GS/MedQ  DD: 09/03/2008  DT: 09/04/2008  Job #: [id number]   cc:   Selinda Flavin

## 2011-02-01 NOTE — Cardiovascular Report (Signed)
[facility]ALTHC[facility]                   [place] ELECTROPHYSI[facility] DEVICE CLINIC NOTE   NAME:Clemons, ERRIN CHEWNING                     MRN:          [id number]  DATE:04/06/2007                            DOB:          1951-05-15    Mr. Rohr has ischemic heart disease, prior ICD implantation, with  inducible sustained VT and, as I recall, he has had appropriate therapy.   He was recently out working in the yard and he got shocked.  The  electrograms were brought by [employer] from [place] to [place],  where they demonstrated atrial fibrillation with a rapid ventricular  response.  His Toprol was increased from 50 mg to 75 mg and detection  algorithms were activated.   He is a little bit tired since that Toprol up-titration, but it seems to  be related to the heat.  Other medications include Monopril, Aldactone,  Vytorin 10/80 mg and aspirin.   On examination today, his blood pressure was 103/65, pulse of 60.  Lungs were clear.  Heart sounds were regular.  Extremities were without edema.   Interrogation of his Guidant Prizm [id number] ICD demonstrates an R wave of  17.1 and an impedance of 789, a threshold of 0.6 at 0.4.  Since having  been shocked there has been one intercurrent episode of SVT terminated  with antitachycardia pacing.  I reviewed the atrial fibrillation with  him and I reprogrammed his detection enhancements.  I increased his  sustained rate duration to 5 minutes.  I increased his detection to 30  beats as well as his post shock to 20 beats.   We also have increased his Toprol from 75 mg to 100 mg a day.  He will  be following up with Dr. Andee Lineman as previously scheduled following a  Myoview that was done today.  He will see him as previously scheduled.     Duke Salvia, MD, [facility]  Electronically Signed    SCK/MedQ  DD: 04/06/2007  DT: 04/07/2007  Job #: [id number]

## 2011-02-01 NOTE — Cardiovascular Report (Signed)
[facility]ALTHCARE                   [place] ELECTROPHY[facility]OLOG[facility] DEVICE CLINIC NOTE   NAME:Bon, PANAGIOTIS OELKERS                     MRN:          [id number]  DATE:02/09/2007                            DOB:          [birth year]    Mr. Mehring is status post ICD implantation for nonsustained VT with  inducible sustained VT.  He also has ischemic heart disease with  depressed left ventricular function with ejection fraction having fallen  into the mid 20s with recent ejection fraction in April, 2008 of 35-40%.  He has had intercurrent stroke for which he has recovered.  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   His current medications include aspirin, Toprol 50, Coumadin, Monopril,  Aldactone, Vytorin 10/80.   PHYSICAL EXAMINATION:  VITAL SIGNS:  Blood pressure 101/69, pulse 76.  LUNGS:  Clear.  CARDIAC:  Heart sounds are regular.  EXTREMITIES:  Without edema.   Interrogation of his Guidant ICD demonstrates an R wave of 16.4 with an  impedence of 927 with a threshold of 0.4 and 0.4 with a high voltage  impedence of 52.  His battery voltage is 2.58.  There are no  intercurrent therapies or intercurrent episodes.   IMPRESSION:  1. 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  2. Status post implantable cardioverter/defibrillator for the above.  3. Ischemic heart disease with depressed left ventricular function at      35-40%.  4. Prior stroke.   Mr. Blakeman is doing beautifully.  We will see him again in 12 months  time.  He will be followed intercurrently with [facility].     Duke Salvia, MD, [facility]  Electronically Signed    SCK/MedQ  DD: 02/09/2007  DT: 02/09/2007  Job #: [id number]   cc:   Selinda Flavin

## 2011-02-01 NOTE — Cardiovascular Report (Signed)
NAME:  [name], [name] NO.:  [id number]   MEDICAL RECORD NO.:  [id number]          PATIENT TYPE:  INP   LOCATION:  6527                         FACILITY:  [facility]   PHYSICIAN:  Veverly Fells. Excell Seltzer, MD  DATE OF BIRTH:  March 15, 1951   DATE OF PROCEDURE:  06/26/2008  DATE OF DISCHARGE:                            CARDIAC CATHETERIZATION   PROCEDURE:  PTCA and stenting of the mid left circumflex.   INDICATIONS:  [name] is a 60 year old gentleman who presented for  cardiac catheterization to evaluate chest pain and ischemic  cardiomyopathy.  His catheterization was performed by Dr. Antoine Poche this  morning.  It demonstrated a patent stent in the proximal LAD, severe  stenosis in the mid left circumflex, and a patent right coronary artery  with nonobstructive disease.  The LVEF was in the range of 30%.  He was  referred for PCI for treatment of severe stenosis in the left  circumflex.  The planned intervention is with a bare metal stent because  the patient has chronic atrial fibrillation and a high stroke risk with  a history of TIA in the past.  Long-term triple therapy is obviously not  ideal and therefore I elected to treat this lesion with a bare metal  stent.   Risks and indications of the procedure were reviewed with the patient.  Informed consent was obtained.  Left groin was prepped, draped, and  anesthetized with 1% lidocaine using modified Seldinger technique.  A 6-  French sheath was placed in the left femoral artery.  A 6-French XB 3.5  cm guide catheter was inserted.  Angiomax was used for anticoagulation.  The patient has been preloaded with 600 mg of Plavix.  Once a  therapeutic ACT was achieved, a Cougar guidewire was passed into the  distal OM branch of the left circumflex without difficulty.  There was  mild disease throughout the proximal and mid circumflex, but a severe  stenosis in the midportion of the vessel was present.  I elected to  predilate  this with a 2.5 x 12 mm apex balloon, which was inflated to 10  atmospheres.  This improved the lesion.  The balloon was used to assess  the length of the lesion.  The vessel was then treated with a 3.0 x 15  mm Vision stent.  The stent was carefully positioned and deployed at 12  atmospheres.  The stent was well expanded.  There is TIMI III flow in  the vessel.  At completion of stenting, I elected to post dilate with a  [place] balloon.  A 3.25 x 12 mm balloon was used.  It was positioned  within the stent and inflated to 16 atmospheres distally at 14  atmospheres proximally.  At completion of the procedure, there was TIMI  III flow throughout the vessel.  There was a step-up into the proximal  edge of the stent and a step-down of the distal edge.  The entire stent  appeared well expanded.  The guide catheter and wire were removed.  The  femoral angiogram was performed with the entry site was right  at the  bifurcation and I elected not to close the patient.   ASSESSMENT:  Successful percutaneous coronary intervention to severe  stenosis in the mid left circumflex using a Multi-link Vision bare metal  stent.   RECOMMENDATIONS:  Recommend aspirin and Plavix for 30 days.  We will  restart the patient on Coumadin tonight.  At the end of 30 days, the  patient can continue on Coumadin, a low-dose aspirin, or an  anticoagulation profile per Dr. Andee Lineman.      Veverly Fells. Excell Seltzer, MD  Electronically Signed     MDC/MEDQ  D:  06/26/2008  T:  06/27/2008  Job:  [id number]   cc:   Learta Codding, MD,FACC

## 2011-02-01 NOTE — Cardiovascular Report (Signed)
[facility]                   [place] ELECTROPHYSIOLOGY DEVICE CLINIC NOTE   NAME:Schroth, DONIVAN THAMMAVONG                     MRN:          [id number]  DATE:01/10/2008                            DOB:          Aug 11, 1951    Mr. Koepp is seen following inappropriate therapy for atrial  fibrillation with a rapid ventricular response, most recently in  December.  We reprogrammed his device to a VF only device, essentially  with the first therapy at 210 and a monitoring zone between 170 and 210.  He has had no intercurrent episodes.  He is feeling quite well.  He is  trying to work on losing some weight.   MEDICATIONS:  His medications are unchanged.   EXAMINATION:  VITAL SIGNS:  On examination today, his blood pressure was  97/62 with a pulse of 53.  LUNGS:  His lungs were clear.  HEART:  Sounds were regular.  EXTREMITIES:  Without edema.   Interrogation of device was as noted above.   IMPRESSION:  1. Atrial fibrillation with inappropriate therapy.  2. History of ventricular tachycardia.  3. Ischemic cardiomyopathy and depressed left ventricular function.  4. Prior cerebrovascular accident.   Mr. Hall is stable without intercurrent problems with his coronary  disease.  His rhythm is stable as well.  At this point, we will hold off  on the consideration of antiarrhythmic drug therapy.   We will see him again in one year's time.  He will be followed remotely  in the interim.     Duke Salvia, MD, [facility]  Electronically Signed    SCK/MedQ  DD: 01/10/2008  DT: 01/10/2008  Job #: [id number]

## 2011-02-04 NOTE — Op Note (Signed)
[place]  Patient:    [name], [name]                     MRN: [id number] Proc. Date: 09/29/00 Adm. Date:  [id number] Attending:  Nathen May CC:         Electrophysiology Laboratory  [place] [facility], Attn:  [facility], [place]  Dr. Selinda Flavin, Jonita Albee, _________ [facility]   Operative Report  PREOPERATIVE DIAGNOSIS:  Ventricular tachycardia and severe ischemic cardiomyopathy.  POSTOPERATIVE DIAGNOSIS:  Ventricular tachycardia and severe ischemic cardiomyopathy.  PROCEDURE:  Contrast venography, implantation of a single-chamber defibrillator.  DESCRIPTION OF PROCEDURE:  Following the obtainment of informed consent, the patient was brought to the electrophysiology laboratory and placed on the fluoroscopic table in the supine position.  After routine prep and drape of the left upper chest, intravenous contrast was injected via the left antecubital vein to identify the course and the patency of the extrathoracic left subclavian vein.  This having been accomplished, lidocaine was infiltrated in the prepectoral subclavicular region.  An incision was made and carried down to the layer of the prepectoral fascia using electrocautery.  A pocket was formed using electrocautery. Hemostasis was obtained.  Thereafter, attention was turned to gaining access to the extrathoracic left subclavian vein, which was accomplished without difficulty and without the aspiration of air or puncture of the artery.  A guidewire was placed and retained and a 0 silk suture was placed in a figure-of-eight fashion and allowed to hang loosely.  Sequentially, a 9-French tear-away introducer sheath was placed, through which was passed a Guidant 0148 dual-coil, passive fixation defibrillator lead. Serial number [id number].  Under fluoroscopic guidance, it was manipulated to the right ventricular apex, where the bipolar R-wave was 19.6 mV with a  pacing impedance of 1000 ohms and a pacing threshold of 0.4 volts at 0.5 msec and a current of threshold of 0.3 mA.  There was no diaphragmatic pacing at 10 volts.  With these acceptable parameters recorded, the lead was secured to the prepectoral fascia and then attached to a Guidant 1860 Prism VR ICD, serial number [id number].  Intermittent ventricular pacing was identified and through the device, the bipolar R-wave was 19.0 mV with a pacing impedance of 1032 ohms and a pacing threshold of 0.5 msec at 0.4 volts.  The high voltage impedance was 55 ohms.  With these acceptable parameters recorded, defibrillation threshold testing was undertaken under the care of Dr. Diamantina Monks.  The patient was sedated using Propofol.  Ventricular fibrillation was induced via the T-wave shock. After a total duration of 9.5 seconds, a 14-joule shock was delivered through a measured resistance of 43 ohms terminating ventricular fibrillation and restoring sinus rhythm.  This was accomplished at least sensitivity.  After a wait of 4-5 minutes, ventricular fibrillation was reintroduced using the T-wave shock.  After a total duration of 9 seconds, a 14-joule shock was delivered through a measured resistance of 44 ohms terminating the ventricular fibrillation and restoring sinus rhythm.  This was accomplished at nominal sensitivity.  With these acceptable parameters recorded, the system was implanted.  Hemostasis was assured.  The patient had a bleeder at the lateral aspect of the pocket, which was compressed and was observed for three minutes prior to wound closure.  The pocket was copiously irrigated with antibiotic containing saline solution.  The lead and the pulse generator were then placed in the pocket and secured to the prepectoral fascia.  The wound was closed in three layers in the normal fashion.  The wound was washed, dried and a benzoin/Steri-Strips dressing was then applied.  Needle counts and  sponge counts and instrument counts were correct at the end of the procedure according to the staff. DD:  09/29/00 TD:  09/29/00 Job: [id number]

## 2011-02-04 NOTE — Assessment & Plan Note (Signed)
[facility]                           ELECTROPHYSIOLOGY OFFICE NOTE   NAME:POWELLLoraine, Freid                       MRN:          [id number]  DATE:08/03/2006                            DOB:          1951-09-09    Mr. Fitzhenry is seen in the device clinic today, August 03, 2006, in the  [place] office for routine followup of his Guidant [place] II model 1478 single-  chamber defibrillator implanted January 2002.  Upon interrogation there are  27 monitored and 19 nonsustained episodes stored in the device which is  typically unchanged from previous testing.  Battery voltage is 2.59 with the  last recorded charge time of 14.3 seconds.  In the right ventricle,  intrinsic amplitude is 16.4 millivolts with an impedance of 789 ohms,  threshold of 0.6 volts at 0.4 milliseconds.  High voltage lead impedance is  52 ohms.  No programming changes were made at this time.  He will send a  Latitude transmission in February and be seen in May for followup by Dr.  Graciela Husbands.      Cleatrice Burke, RN  Electronically Signed      Duke Salvia, MD, [facility]  Electronically Signed   CF/MedQ  DD: 08/03/2006  DT: 08/03/2006  Job #: [id number]

## 2011-02-04 NOTE — Cardiovascular Report (Signed)
NAME:  [name], [name] NO.:  [id number]   MEDICAL RECORD NO.:  [id number]          PATIENT TYPE:  OIB   LOCATION:  1963                         FACILITY:  [facility]   PHYSICIAN:  Salvadore Farber, M.D. LHCDATE OF BIRTH:  27-Apr-1951   DATE OF PROCEDURE:  06/23/2005  DATE OF DISCHARGE:                              CARDIAC CATHETERIZATION   PROCEDURE:  Coronary angiography.   INDICATIONS FOR PROCEDURE:  [name] is a 60 year old gentleman who  suffered prior anterior myocardial infarction and treated with stent to the  LAD.  Ejection fraction has been reported to be in the 25-35% range.  In  2004 he was documented to have LV thrombus from which he had embolic  strokes.  He has minimal residual from these.  He of late has been having  some exertional back and chest discomfort which is reminiscent of the pain  preceding his myocardial infarction.  He is, therefore, referred for  diagnostic angiography.   PROCEDURE TECHNIQUE:  Informed consent was obtained.  Under 1% Lidocaine and  local anesthesia, a 4-French sheath was placed in the right common femoral  arteries using the modified Seldinger technique.  Coronary angiography was  performed using JL-4 and JR-4 catheters.  Due to the presence of the LV  thrombus, the left ventricle was not entered.  The patient was then  transferred to the holding room in stable condition.  Sheaths are to be  removed there.   COMPLICATIONS:  None.   FINDINGS:  1.  Left main:  Angiographically normal  2.  LAD:  Moderate size vessel giving rise to three small diagonals.  There      is a 30% stenosis of the proximal vessel just prior to the previously      placed stent.  The stent has 30% in-stent restenosis in the proximal      segments.  The remainder of the LAD and diagonal system was free of any      significant disease.  3.  Ramus intermedius:  Large vessel which is angiographically normal  4.  Circumflex:  Moderate size vessel  giving rise to a single obtuse      marginal.  He was angiographically normal.  5.  RCA:  Large, dominant vessel.  There is a 30% stenosis just in from the      osteum of the vessel and a 30% stenosis at the takeoff of the conus.      There was then a 30% stenosis at the mid vessel.   IMPRESSION/RECOMMENDATIONS:  The patient has mild nonobstructive coronary  disease.  The LAD stent is widely patent without significant in-stent  restenosis.  He thus has no epicardial coronary disease which would  explain his discomforts.  Since left ventricular and diastolic pressure was  not measured, I certainly cannot exclude elevated LVEDP leading to  subendocardial ischemia.  The patient will followup with Dr. Andee Lineman for  further management.      Salvadore Farber, M.D. [facility]  Electronically Signed     WED/MEDQ  D:  06/23/2005  T:  06/23/2005  Job:  [id number]  cc:   Selinda Flavin  Fax: [phone]   Duke Salvia, M.D.  [phone] [address]  [address]  [place]  [place] [address]   Learta Codding, M.D. [facility]  [address]  [address]  [place]  [place] [address]

## 2011-02-04 NOTE — Consult Note (Signed)
NAME:  [name], [name]                        ACCOUNT NO.:  [id number]   MEDICAL RECORD NO.:  [id number]                   PATIENT TYPE:  INP   LOCATION:  6734                                 FACILITY:  [facility]   PHYSICIAN:  Melvyn Novas, M.D.               DATE OF BIRTH:  01-17-1951   DATE OF CONSULTATION:  04/28/2003  DATE OF DISCHARGE:                                   CONSULTATION   CONSULTING PHYSICIAN:  Melvyn Novas, M.D.   REASON FOR CONSULTATION:  [name] is a 60 year old Caucasian right-handed  gentleman from [place], [place] who works as a Psychologist, occupational.  His date of  birth is 1951/02/24.   This married gentleman's adult children states that he was in his usual  state of health until Saturday morning at least when he noticed trouble with  reading lines on a newspaper.  In the afternoon about 3 p.m. he went to a  golf shop and states that he felt suddenly very dizzy.  He bought a bucket  of balls and later played them all on the golf course.  He had in the shop  trouble to sign his check.   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  He  could not quite add them up or subtract and felt confused.  Since the  gentleman is a Psychologist, occupational, he felt that this made him more scared and he called  [facility] who then referred him to the primary care doctor.  All together, he had noticed problems with reading, problems with writing  and acalculia, agraphia, alexia.   The patient has a history of arrythmia and previous what he calls near  syncope spells 10 years ago.  He had a defibrillator placed.  He also has  history of coronary artery disease and myocardial infarction 10 years ago,  hypercholesterolemia placed on Lipitor, aspirin, Toprol and Monopril; for  dosages, please see Dr. Bing Neighbors consult.   SOCIAL HISTORY:  He is a nonsmoker, nondrinker.  He is  married with adult  children, three dogs, avid golfer.   FAMILY HISTORY:  Mother has non-Hodgkin lymphoma in remission.  Father died  at age 19 of heart attack.  Younger brother is healthy and middle brother  had mitral valve replacement.  The children are healthy.   PHYSICAL EXAMINATION:  VITAL SIGNS:  The patient has a heart rate of 76,  blood pressure 140/85, respiratory rate 14.  O2 is 98% on room air.  Temperature is 97.8.  LUNGS:  Clear to auscultation.  COR:  Regular  rate and rhythm.  No carotid bruits are detected. All  peripheral pulses are equally palpable.  No cyanosis, no clubbing or edema.  No bruises.  No abnormal rash.  HEENT:  Mucous membranes appear well  perfused, non pale.  He had anicteric  sclerae.  NEUROLOGIC:  Mental status:  Alert and oriented.  Fluent speech.  No sign of  aphasia or comprehension deficits, memory or attention deficits.  He shows  apraxia, however.  He cannot do a serial seven and could not draw a clock  face.  Cranial nerves:  Pupils are equal to accommodation.  Full extraocular  movements.  No evidence of visual field deficits by bilateral simultaneous  stimulation.  No papilledema.  No facial weakness or numbness either.  Tongue and uvula move midline.  NECK:  Supple.  MOTOR:  No pronator drift.  Equal grip strength bilaterally.  Strength, tone  and bulk appear completely symmetric for the whole body.  Equal deep tendon  reflexes.  No Babinski elicited.  No clonus.  Gait and stance intact.  No  Romberg.  Sensory intact to all modalities.   ASSESSMENT AND RECOMMENDATIONS:  This is a cortical lesion, but it unites  left and right parietal deficits such as the apraxia which is most likely  attributed to the right parietal lobe and left parietal lesions which could  cause agraphia and alexia.  The patient has presumably multiple lesions by  CT which was done in lieu of an MRI.  The left parietal high lesion close to  the vertex seems to be of  acute to subacute onset and ischemic in nature.  No evidence of hemorrhagic conversion.  The right posterior parietal lobe  also has presumably near vertex lesion, but there is no surrounding edema  which makes it more likely to be an older lesion.  There is frontal  encephalomalacia indicating a remote ischemic lesion in the right.  Due to  the multiple ischemia cerebrovascular accidents (CVAs) also of different  times, cardiac source is the most likely.  The patient has left and right  hemispheric lesions making a unilateral carotid artery disease most likely  to be the source.  He continues to here to present with left and right  confusion, apraxia, acalculia.  I suggest since an MRI and MRA is not  possible, that we will evaluate him further with bilateral Dopplers for the  carotid function and do a cardiac echo if supported by Dr. Sherryl Manges.  I  would suggest TEE, then start heparin IV without bolus and consider  conversion to Coumadin for long term treatment.   The patient will be followed by Dr. Sunny Schlein. Pearlean Brownie, the stroke specialist  of [facility].                                               Melvyn Novas, M.D.    CD/MEDQ  D:  04/28/2003  T:  04/28/2003  Job:  [id number]

## 2011-02-04 NOTE — Discharge Summary (Signed)
NAME:  [name], [name]                        ACCOUNT NO.:  [id number]   MEDICAL RECORD NO.:  [id number]                   PATIENT TYPE:  INP   LOCATION:  6734                                 FACILITY:  [facility]   PHYSICIAN:  Ileana Roup, M.D.               DATE OF BIRTH:  1950-12-09   DATE OF ADMISSION:  04/27/2003  DATE OF DISCHARGE:  05/04/2003                                 DISCHARGE SUMMARY   DISCHARGE DIAGNOSES:  1. Embolic cerebrovascular accident.  2. Hypertension.  3. Hyperlipidemia.  4. Coronary artery disease, status post myocardial infarction approximately     10 years ago.  5. Prior history of non-sustained ventricular tachycardia, status post     implantable cardioverter defibrillator implantation, January 2002.   REASON FOR HOSPITALIZATION:  A 60 year old male reported to the ED after  noticing that for the past 24 hours he has had difficulty writing his name,  forming letters, and remembering telephone numbers.  He was found to have  likely embolic infarction on CT scan.  Both subacute and remote studies.  CT  scan of the head with and without contrast on April 27, 2003, showed CT  findings compatible with remote infarction, right posterior frontal lobe  with encephalomalacia.  Also, bilateral upper parietal hypodensities near  the vertex of the brain.  The radiologist favors that the hyperdensity on  the right side is either a remote infarction, however, that the focus on the  left side appeared to be somewhat more ill-defined and was concerning for a  late subacute or subacute infarction.  Emboli would be a consideration.  A 2-  D echo showed left ventricular ejection fraction of approximately 25% with  akinesis of the distal 1/2 to 1/3 of the ventricle, a large thrombus at the  apex of the ventricle.  Cerebrovascular evaluation on April 29, 2003,  impressions:  Vertebral artery flow antegrade, mild plaques bilaterally, but  no stenosis.  On April 29, 2003, a  transcranial Doppler showed normal mean  flow velocities in the anterior cerebral circulation, felt optimal occipital  window limit evaluation of posterior circulation.   CONSULTATIONS:  1. Consultations were obtained, Dr. [place] Bing of [facility].  2. Dr. Porfirio Mylar Dohmeier of neurology.   HOSPITAL COURSE:  #1 -  The patient presented to [facility] ED with symptoms  of right-to-left confusion, mild acalculous, agraphia out of proportion to  alexia.  Was found to have bilateral parietal infarcts, likely secondary to  a cardioembolic source.  Was diagnosed with Gerstmann's syndrome.  The  patient was anticoagulated with heparin, and subsequently with Coumadin.  A  2-D echo was performed to help define the etiology of the patient's emboli.  The patient was found to have a large thrombus located at the apex of his  left ventricle.  Throughout his stay, cognitive function slowly returned.  The patient was discharged to home on Coumadin after being therapeutic  on  Coumadin for two days.  The patient was instructed that he would need close  followup on Coumadin therapy, and has opted to follow up with [facility]  [facility]iology in [place], [place].  #2 -  HYPERLIPIDEMIA:  The patient was treated with Lipitor 80 mg p.o. q.d.  which is his home regimen.  #3 -  HYPERTENSION:  The patient was placed on Lisinopril 40 mg b.i.d.,  Toprol XL 50 mg q.12h.  He was also maintained on one aspirin p.o. q.d.  The  patient was taking an experimental drug which could be clopidogrel 75 mg or  a placebo.  This drug was discontinued during this hospitalization, and was  not restarted upon discharge.   DISCHARGE MEDICATIONS:  1. Lipitor 80 mg p.o. q.d.  2. Monopril 40 mg p.o. q.d.  3. Toprol XL 50 mg p.o. q.d.  4. Aspirin 81 mg q.d.  5. Coumadin 5 mg q.d.   FOLLOWUP:  1. Mr. Bergen has a follow up appointment scheduled with [facility]     in [place], [place], at the Coumadin Clinic.  2. He also  has a follow up appointment scheduled with his primary care     physician, Dr. Dimas Aguas in [place], [place].  3. Will himself schedule a follow up appointment with Dr. Delia Heady, the     stroke specialist of [facility] in the next four     weeks following discharge.      Donald Pore, MD                          Ileana Roup, M.D.    HP/MEDQ  D:  09/17/2003  T:  09/17/2003  Job:  [id number]

## 2011-02-04 NOTE — Assessment & Plan Note (Signed)
[facility]                          [place] [facility] OFFICE NOTE   NAME:Lall, ABBY STINES                     MRN:          [id number]  DATE:01/01/2007                            DOB:          December 17, 1950    HISTORY OF PRESENT ILLNESS:  The patient is a 60 year old male with  history of coronary artery disease, status post prior myocardial  infarction.  The patient has LV dysfunction, status post implantable  defibrillator.  The patient had a catheterization in October 2006, which  showed mild nonobstructive coronary artery disease.  The patient denies  any orthopnea, PND, palpitations or syncope.  The patient is doing quite  well.   The patient tells me that he had his defibrillator interrogated several  months ago and was concerned about battery life.  He has had no  discharges.   MEDICATIONS:  1. Enteric coated aspirin 81 mg a day.  2. Toprol XL 50 mg a day.  3. Coumadin 5 mg a day.  4. Monopril 20 mg p.o. daily.  5. Spironolactone 25 mg a day.  6. Vytorin 10/80 mg p.o. daily.   PHYSICAL EXAMINATION:  VITAL SIGNS:  Blood pressure is 121/71, heart  rate 72 beats per minute.  GENERAL:  A well-nourished, white male in no apparent distress.  HEENT:  Eyes are clear.  NECK:  Normal carotid upstroke.  No carotid bruits.  LUNGS:  Clear.  HEART:  Regular rate and rhythm, normal S1, S2, no murmurs, rubs or  gallops.  ABDOMEN:  Soft.  EXTREMITIES:  No edema.   ASSESSMENT:  1. Status post electrophysiology study with ventricular tachycardia.  2. Status post implantable cardioverter-defibrillator implant.  3. Ischemic cardiomyopathy.      a.     Status post anterior wall myocardial infarction.      b.     Ejection fraction 25-35%.      c.     Status post catheterization in 2006, with patent stent to       the left anterior descending.  4. Prior cerebrovascular accident secondary to large left ventricle      thrombus on life-long Coumadin  therapy.  5. Dyslipidemia.   PLAN:  1. The patient will have a lipid panel done.  2. The patient will follow up with Dr. Graciela Husbands for defibrillator      interrogation.  3. The patient has been scheduled for echocardiogram to reassess his      left ventricular function.     Learta Codding, MD,FACC  Electronically Signed    GED/MedQ  DD: 01/01/2007  DT: 01/02/2007  Job #: [id number]

## 2011-02-04 NOTE — Discharge Summary (Signed)
[place]  Patient:    [name], [name]                     MRN: [id number] Adm. Date:  [id number] Disc. Date: [id number] Attending:  Nathen May Dictator:   Rozell Searing, P.A. CC:         [facility], [place], [place]  Gillie Manners. Dimas Aguas, M.D.   Referring Physician Discharge [place]  PROCEDURES:  Electrophysiology study with implantable cardioverter defibrillator implantation on September 29, 2000.  HOSPITAL COURSE:  The patient was admitted for elective electrophysiology study and placement of ICD by Nathen May, M.D., F.A.C.C., on September 29, 2000, for treatment of nonsustained ventricular tachycardia.  The patient had been previously found to have noninducible ventricular tachycardia.  He has known ischemic cardiomyopathy; status post myocardial infarction with severe LVT (EF 25-30%).  The patient underwent the procedure with no noted complications.  Following implantation, the patient was noted to have a 14-beat run of wide complex tachycardia.  No symptoms were noted and the patient was hemodynamically stable.  The ICD was interrogated and found to have ventricular tachycardia of 500 msec.  Toprol was increased to 50 mg q.d.  No other medication adjustments were made and the patient was cleared for discharge.  MEDICATIONS AT DISCHARGE: 1. Toprol XL 50 mg q.d. 2. Monopril 20 mg q.d. 3. Lipitor 80 mg q.d. 4. Ecotrin one tablet q.d. 5. Protegra one tablet q.d.  WOUND CARE:  The patient is instructed to keep the incision clean and dry for seven days.  FOLLOW-UP:  He is to call and schedule a follow-up appointment with Roxanne Mins, P.A.C., for a follow-up wound check in approximately 10 days.  He is to then return to Nathen May, M.D., F.A.C.C., for follow-up at the [facility] [place]  DISCHARGE DIAGNOSES: 1. Nonsustained ventricular tachycardia.    a. Status post implantable  cardioverter defibrillator implantation on       September 29, 2000.    b. History of noninducible electrophysiology study. 2. Severe ischemic cardiomyopathy.    a. Ejection fraction 25-30%.    b. Status post anterior myocardial infarction. 3. Hyperlipidemia. DD:  10/13/00 TD:  10/13/00 Job: [id number]

## 2011-02-04 NOTE — Consult Note (Signed)
NAME:  [name], [name]                        ACCOUNT NO.:  [id number]   MEDICAL RECORD NO.:  [id number]                   PATIENT TYPE:  INP   LOCATION:  6734                                 FACILITY:  [facility]   PHYSICIAN:  [place] Bing, M.D.               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  [name]  suffered an acute anterior myocardial infarction in 1994, treated with  primary intervention.  He subsequently underwent a repeat procedure with  stent placement for restenosis, and then a PCI of the circumflex in 1998.  His most recent catheterization was in August 2000 where his previous  intervention sites were patent, a 50% RCA stenosis was present, and ejection  fraction was 0.40-0.45.  A stress Cardiolite in September 2001 revealed no  ischemia, anteroapical scar, and an ejection fraction of 0.34.  He has done  well since from a cardiac standpoint.  He continues to receive aggressive  treatment for hypertension and hyperlipidemia.  An ICD was placed in 2002 as  part of the MADIT-II protocol.  There was a history of inducible ventricular  tachycardia and pre-syncope.  I have no information about whether his ICD  has ever discharged.  He was scheduled for reassessment of the device in  clinic today.  [name] came to the hospital after noting difficulty  remembering in working with numbers.  A head CT revealed a subacute left  parietal infarction and a remote right frontal lobe infarction.  His initial  treatment has been with intravenous heparin, clopidogrel, and aspirin.  He  was previously taking a study drug which may have been clopidogrel or  placebo - this is being held.  The patient subjectively has experienced some  improvement in his initial  symptoms.  He also described some clumsiness of  his right hand, which has resolved.   PAST MEDICAL HISTORY:  Otherwise unremarkable.   MEDICATIONS ON ADMISSION:  1. Toprol 50 mg daily.  2. Atorvastatin 80 mg daily.  3. Monopril 20 mg b.i.d.  4. Aspirin 81 mg daily.   FAMILY HISTORY:  Father suffered a fatal myocardial infarction at age 25.  One of his siblings has valvular heart disease.   SOCIAL HISTORY:  Works as a Psychologist, occupational.  No history of tobacco use.  Moderate  alcohol use.   REVIEW OF SYSTEMS:  Minor urinary frequency.  Otherwise all systems  negative.   PHYSICAL EXAMINATION:  GENERAL:  Pleasant, well-appearing gentleman.  VITAL SIGNS:  Temperature 98, heart rate 55 and regular, respirations 20,  blood pressure 130/80.  HEENT:  Anicteric sclerae.  Pupils equal, round and reactive to light.  Full  EOM's.  NECK:  No jugular venous distention.  No carotid bruits.  ENDOCRINE:  No thyromegaly.  HEMATOPOIETIC:  No adenopathy.  SKIN:  No significant lesions.  LUNGS:  Clear.  CARDIAC:  Normal first and second heart sounds.  Prominent fourth heart  sound.  ABDOMEN:  Soft and nontender.  No organomegaly.  No bruits.  EXTREMITIES:  Distal pulses intact.  No edema.  NEUROMUSCULAR:  Cerebellar function is normal.  No cranial nerves  abnormalities.  Normal strength and tone.   EKG revealed normal sinus rhythm, previous anteroseptal myocardial  infarction; no change compared with November 2001.   LABORATORY DATA:  Initial laboratory notable for a potassium of 3.3.   IMPRESSION:  [name] presents with a second cerebrovascular accident and  minor neurologic symptoms.  His first event was apparently silent.  The CT  characteristics reportedly favor an embolic origin.  His cardiomyopathy is a  potential nidus for left ventricular thrombus - a transthoracic  echocardiogram will assist in evaluating this possibility.  He also is at  risk for atrial fibrillation - his implantable  cardioverter-defibrillator  (ICD) will be interrogated to rule out this possibility.  He may need a  transesophageal echocardiogram, but I suspect the appropriate treatment will  be with warfarin chronically, whether or not a thrombus is definitively  identified.  For now, intravenous heparin is appropriate.  The study nurse  has been informed that he is off protocol therapy - it will be determined  before he is discharged whether resuming this protocol is appropriate.  His  cardiac status is stable, and therapy is appropriate.   We appreciate the request for consultation, and will be happy to follow this  nice gentleman with you.                                               [place] Bing, M.D.    RR/MEDQ  D:  04/28/2003  T:  04/28/2003  Job:  [id number]   cc:   Selinda Flavin  [address]  [place]  [place] [address]  Fax: [phone]   Duke Salvia, M.D.

## 2011-02-04 NOTE — Procedures (Signed)
[place]  Patient:    [name], [name]                     MRN: [id number] Proc. Date: 07/24/00 Adm. Date:  [id number] Attending:  Nathen Smith                           Procedure Report  OPTICAL DISK:  152-A  PREOPERATIVE DIAGNOSES:  Nonsustained ventricular tachycardia; ischemic cardiomyopathy, ejection fraction less than 35%.  POSTOPERATIVE DIAGNOSES:  Nonsustained ventricular tachycardia, ischemic cardiomyopathy, ejection fraction less than 35%; inducible ventricular tachycardia - polymorphic.  COMPLICATIONS:  None apparent.  Following the obtainment of informed consent, [name] was brought to the electrophysiology laboratory and placed in the fluoroscopic table in the supine position.  After routine prep and drape, cardiac catheterization was performed with local anesthesia and conscious sedation.  Noninvasive blood pressure monitoring and transcutaneous oxygen saturation monitoring were performed continuously throughout the procedure.  Following the procedure, the catheters were removed, hemostasis was obtained and the patient was transferred to the floor in stable condition.  CATHETERS:  A 5-French quadripolar catheter was inserted via the left femoral vein to the A-V junction and subsequently moved to the right ventricular outflow tract and then back into the A-V junction.  A 5-French quadripolar catheter was inserted via the left femoral vein to the right ventricular apex and subsequently moved to the high right atrium.  Surface leads I, aVF, and V1 were monitored continuously throughout the procedure.  Following insertion of the catheters, the stimulation protocol included:  1. Incremental atrial pacing. 2. Incremental ventricular pacing.  Single atrial extrastimuli at a paced cycle length of 600 msec.  Single, double and triple ventricular extrastimuli from the right ventricular apex and the right ventricular  outflow tract at paced cycle lengths of 600 and 400 msec.  Single and double ventricular extrastimuli from the right ventricular apex at a paced cycle length of 400:600 msec.  RESULTS:  SURFACE ELECTROCARDIOGRAM: RHYTHM:                       Sinus CYCLE LENGTH:                 891 msec P-R INTERVAL:                 147 msec. QRS DURATION:                  88 msec Q-T INTERVAL:                 424 msec P-WAVE DURATION:              102 msec. PREEXCITATION:                Absent BUNDLE BRANCH BLOCK:          Absent  A-V NODAL FUNCTION: A-H Interval:                  72 msec A-V Wenckebach:               450 msec VA conduction:              1:1 at 600 msec, but was dissociated at 400 msec.  A-V Nodal effective refractory period antegrade at a paced cycle length of 600 msec was 340 msec with dual A-V nodal physiology evident without echo beats, but  without further therapy with isoproterenol.  HIS PURKINJE SYSTEM FUNCTION: A-V Interval:                     49 msec His bundle duration:              17 msec  ACCESSORY PATHWAY:  No evidence of an accessory pathway was identified.  VENTRICULAR RESPONSE TO PROGRAMMED STIMULATION:  Effective refractory period at the right ventricular apex at a paced cycle length of 600 msec was 250 msec and at 400 msec was 220 msec.  The effective refractory period at the right ventricular outflow tract at a paced cycle length of 600 msec was 260 msec and at 400 msec was 240 msec.  The closest coupling intervals at the right ventricular apex was at a paced cycle length of 600 msec was 250:210:210 and at 400 msec was 230:200:200.  The closest coupling interval at the right ventricular outflow tract at a paced cycle length of 600 msec was 270:210:200 and at 400 msec was 230:200:200.  ARRHYTHMIAS INDUCED:  Repeated episodes of nonsustained ventricular polymorphic tachycardia were induced with single, double and triple ventricular extrastimuli at  various coupling intervals.  Ventricular tachycardia - polymorphic - sustained was induced at 600:260:210:210 from the right ventricular apex.  The tachycardia had cycle lengths ranging from 230 to 185 msec and was treated with biphasic defibrillation at 200 joules.  IMPRESSION: 1. Normal sinus function. 2. Normal atrial function. 3. Dual antegrade A-V nodal physiology without inducible echo beats; isoproterenol was not utilized. 4. Normal His Purkinje system function. 5. No accessory pathway. 6. No inducible sustained monomorphic ventricular arrhythmias; inducible polymorphic arrhythmias were seen.  SUMMARY AND CONCLUSION:  The results of electrophysiological testing failed to identify a substrate to be characterized as high-risk.  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  RECOMMENDATIONS:  Continued medical therapy. DD:  07/24/00 TD:  07/24/00 Job: [id number]

## 2011-02-10 ENCOUNTER — Encounter: Payer: Self-pay | Admitting: *Deleted

## 2011-02-10 ENCOUNTER — Encounter: Payer: BC Managed Care – PPO | Admitting: *Deleted

## 2011-02-23 ENCOUNTER — Ambulatory Visit (INDEPENDENT_AMBULATORY_CARE_PROVIDER_SITE_OTHER): Payer: BC Managed Care – PPO | Admitting: *Deleted

## 2011-02-23 DIAGNOSIS — I6350 Cerebral infarction due to unspecified occlusion or stenosis of unspecified cerebral artery: Secondary | ICD-10-CM

## 2011-02-23 DIAGNOSIS — Z7901 Long term (current) use of anticoagulants: Secondary | ICD-10-CM

## 2011-02-23 DIAGNOSIS — I4891 Unspecified atrial fibrillation: Secondary | ICD-10-CM

## 2011-03-03 ENCOUNTER — Encounter: Payer: Self-pay | Admitting: Cardiology

## 2011-03-23 ENCOUNTER — Other Ambulatory Visit: Payer: Self-pay | Admitting: *Deleted

## 2011-03-23 MED ORDER — WARFARIN SODIUM 5 MG PO TABS: ORAL_TABLET | ORAL | Status: DC

## 2011-03-24 ENCOUNTER — Ambulatory Visit (INDEPENDENT_AMBULATORY_CARE_PROVIDER_SITE_OTHER): Payer: BC Managed Care – PPO | Admitting: *Deleted

## 2011-03-24 ENCOUNTER — Other Ambulatory Visit: Payer: Self-pay

## 2011-03-24 DIAGNOSIS — I472 Ventricular tachycardia, unspecified: Secondary | ICD-10-CM

## 2011-03-24 DIAGNOSIS — I4891 Unspecified atrial fibrillation: Secondary | ICD-10-CM

## 2011-03-24 DIAGNOSIS — I6350 Cerebral infarction due to unspecified occlusion or stenosis of unspecified cerebral artery: Secondary | ICD-10-CM

## 2011-03-24 DIAGNOSIS — Z7901 Long term (current) use of anticoagulants: Secondary | ICD-10-CM

## 2011-03-24 LAB — POCT INR: INR: 2.3

## 2011-04-01 LAB — REMOTE ICD DEVICE
BRDY-0002RV: 40 {beats}/min
CHARGE TIME: 12.1 s
DEV-0020ICD: NEGATIVE
DEVICE MODEL ICD: 557946
TZAT-0001FASTVT: 1
TZAT-0013FASTVT: 1
TZAT-0020FASTVT: 1 ms
TZON-0005FASTVT: 6
TZON-0010FASTVT: 80 ms
TZON-0010SLOWVT: 80 ms
TZST-0001FASTVT: 2
TZST-0001FASTVT: 5
TZST-0003FASTVT: 30 J
TZST-0003FASTVT: 36 J
TZST-0003FASTVT: 36 J
VENTRICULAR PACING ICD: 3.4 pct

## 2011-04-04 ENCOUNTER — Encounter: Payer: Self-pay | Admitting: *Deleted

## 2011-04-04 NOTE — Progress Notes (Signed)
icd check in clinic  

## 2011-04-14 ENCOUNTER — Ambulatory Visit (INDEPENDENT_AMBULATORY_CARE_PROVIDER_SITE_OTHER): Payer: BC Managed Care – PPO | Admitting: Cardiology

## 2011-04-14 ENCOUNTER — Encounter: Payer: Self-pay | Admitting: Cardiology

## 2011-04-14 VITALS — BP 91/59 | HR 52 | Resp 18 | Ht 73.0 in | Wt 220.8 lb

## 2011-04-14 DIAGNOSIS — Z95810 Presence of automatic (implantable) cardiac defibrillator: Secondary | ICD-10-CM

## 2011-04-14 DIAGNOSIS — R072 Precordial pain: Secondary | ICD-10-CM

## 2011-04-14 DIAGNOSIS — I259 Chronic ischemic heart disease, unspecified: Secondary | ICD-10-CM

## 2011-04-14 DIAGNOSIS — I2589 Other forms of chronic ischemic heart disease: Secondary | ICD-10-CM

## 2011-04-14 DIAGNOSIS — I6350 Cerebral infarction due to unspecified occlusion or stenosis of unspecified cerebral artery: Secondary | ICD-10-CM

## 2011-04-14 DIAGNOSIS — R0602 Shortness of breath: Secondary | ICD-10-CM

## 2011-04-14 DIAGNOSIS — Z7901 Long term (current) use of anticoagulants: Secondary | ICD-10-CM

## 2011-04-14 DIAGNOSIS — Z01810 Encounter for preprocedural cardiovascular examination: Secondary | ICD-10-CM

## 2011-04-14 DIAGNOSIS — I4891 Unspecified atrial fibrillation: Secondary | ICD-10-CM

## 2011-04-14 NOTE — Assessment & Plan Note (Signed)
The patient remains in normal sinus rhythm. 

## 2011-04-14 NOTE — Assessment & Plan Note (Signed)
Status post prior CVA secondary to LV thrombus post anterior wall myocardial infarction. On Coumadin therapy.

## 2011-04-14 NOTE — Assessment & Plan Note (Signed)
Ejection fraction remains in the range of 35%. The patient has no clinical symptoms of heart failure.

## 2011-04-14 NOTE — Assessment & Plan Note (Signed)
Patient on chronic anticoagulation. This will be discontinued prior to cardiac catheterization and he will need bridging with Lovenox. We will stop Coumadin today and have him come to the Coumadin clinic tomorrow.

## 2011-04-14 NOTE — Progress Notes (Signed)
HPI The patient is a 60 year old male with a prior history of anterior wall myocardial infarction and ischemic cardiomyopathy. Prior ejection fraction has been 35% with anterior scar. The patient had an ischemia evaluation November 2011 and myocardial perfusion study showed a fixed apical anterior and anteroseptal defect with akinesis consistent with prior myocardial infarction ejection fraction was also 35% by this modality. The patient status post ICD placement. Post myocardial infarction the patient had an LV thrombus which was complicated by CVA. He has been on Coumadin since. He has had no recurrent events. The patient reports that he feels that he has slowed down somewhat. He reports a dull ache in his jaw which occurs during activity. He states when he sits and rests it appears to go away associated with this there is also some mild chest pressure again typically present on exertion and relieved with rest. The patient has not used any nitroglycerin. He states he feels more tired than usual. I performed a bedside echocardiogram today in his ejection fraction still appears to be 35% with a large area of apical and anterior akinesis. He has no significant mitral regurgitation. He reports no defibrillator discharges. He reports no orthopnea PND palpitations or syncope.  No Known Allergies  Current Outpatient Prescriptions on File Prior to Visit  Medication Sig Dispense Refill  . bisoprolol (ZEBETA) 10 MG tablet Take 1 tablet (10 mg total) by mouth daily.  30 tablet  6  . finasteride (PROSCAR) 5 MG tablet Take 30 tablets by mouth Daily.      [name] losartan (COZAAR) 50 MG tablet Take 1 tablet (50 mg total) by mouth daily.  30 tablet  6  . LOVAZA 1 G capsule Take 1 capsule by mouth Twice daily.      . rosuvastatin (CRESTOR) 20 MG tablet Take 1 tablet (20 mg total) by mouth daily.  30 tablet  6  . spironolactone (ALDACTONE) 25 MG tablet Take 1 tablet (25 mg total) by mouth daily.  30 tablet  6  .  warfarin (COUMADIN) 5 MG tablet Take 1/2 tablet Mondays Wednesdays and Fridays and 1 tablet all other days or as directed by anticoagulation clinic  45 tablet  2    Past Medical History  Diagnosis Date  . CAD (coronary artery disease)     w/ ischemic cardiomyopathy  . Myocardial infarction     status post prior anterior wall with ventricular remodeling   . CHF NYHA class II   . Paroxysmal atrial fibrillation     hx  . Stroke     hx on coumadin therapy  . Pseudoaneurysm   . A-V fistula   . Inducible ventricular tachycardia   . Normal echocardiogram Nov 2011    ejection fraction 35% with anterior scar  . Ejection fraction < 50%     Ejection fraction 30% w severe anterior, anteroapical, inferoapical akinesis    Past Surgical History  Procedure Date  . Bare metal stent   . Cardiac catheterization   . 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  History   Social History  . Marital Status: Married    Spouse Name: N/A    Number of Children: N/A  . Years of Education: N/A   Occupational History  . Not on file.   Social History Main Topics  . Smoking status: Never Smoker   . Smokeless tobacco: Not on file  . Alcohol Use: No  . Drug Use:  Not on file  . Sexually Active: Not on file   Other Topics Concern  . Not on file   Social History Narrative  . No narrative on file   [id number] positives as outlined above. The remainder of the 18  point review of systems is negative  PHYSICAL EXAM BP 91/59  Pulse 52  Resp 18  Ht 6\' 1"  (1.854 m)  Wt 220 lb 12.8 oz (100.154 kg)  BMI 29.13 kg/m2  SpO2 97%  General: Well-developed, well-nourished in no distress Head: Normocephalic and atraumatic Eyes:PERRLA/EOMI intact, conjunctiva and lids normal Ears: No deformity or lesions Mouth:normal dentition, normal posterior pharynx Neck: Supple, no JVD.  No masses, thyromegaly or abnormal cervical nodes Chest: Status post ICD implantation with  no erythema or swelling. Lungs: Normal breath sounds bilaterally without wheezing.  Normal percussion Cardiac: regular rate and rhythm with normal S1 and S2, no S3 or S4.  PMI is normal.  No pathological murmurs Abdomen: Normal bowel sounds, abdomen is soft and nontender without masses, organomegaly or hernias noted.  No hepatosplenomegaly MSK: Back normal, normal gait muscle strength and tone normal Vascular: Pulse is normal in all 4 extremities Extremities: No peripheral pitting edema Neurologic: Alert and oriented x 3 Skin: Intact without lesions or rashes Lymphatics: No significant adenopathy Psychologic: Normal affect   ECG: Normal sinus rhythm old anterior wall myocardial infarction  Limited bedside echocardiogram: See report in history of present illness  ASSESSMENT AND PLAN

## 2011-04-14 NOTE — Assessment & Plan Note (Signed)
Status post prior anterior wall myocardial infarction, status post prior PCI. The patient reports him concerning symptoms of a dull ache in her jaw and chest pressure upon exertion. Although his ischemia evaluation was negative a year ago with stress testing, quite concerned about his symptoms and I suggested to the patient that we should proceed with cardiac catheterization. I discussed this and benefits of the procedure and is willing to proceed. His prior procedure was complicated by an AV fistula which required thrombin injection.

## 2011-04-14 NOTE — Assessment & Plan Note (Signed)
Status post inducible ventricular tachycardia requiring ICD implantation.

## 2011-04-14 NOTE — Patient Instructions (Addendum)
   Left heart cath - JV lab - Wednesday or Thursday with Dr. Excell Seltzer or Sanjuana Kava  Will need bridging with Lovenox - 5 days   Hold coumadin beginning tonight   Coumadin check tomorrow Follow up will be given at time of discharge from cath

## 2011-04-15 ENCOUNTER — Ambulatory Visit (INDEPENDENT_AMBULATORY_CARE_PROVIDER_SITE_OTHER): Payer: BC Managed Care – PPO | Admitting: *Deleted

## 2011-04-15 ENCOUNTER — Telehealth: Payer: Self-pay | Admitting: *Deleted

## 2011-04-15 ENCOUNTER — Encounter: Payer: Self-pay | Admitting: *Deleted

## 2011-04-15 DIAGNOSIS — Z7901 Long term (current) use of anticoagulants: Secondary | ICD-10-CM

## 2011-04-15 DIAGNOSIS — I4891 Unspecified atrial fibrillation: Secondary | ICD-10-CM

## 2011-04-15 DIAGNOSIS — I6350 Cerebral infarction due to unspecified occlusion or stenosis of unspecified cerebral artery: Secondary | ICD-10-CM

## 2011-04-15 LAB — PROTIME-INR

## 2011-04-15 LAB — POCT INR: INR: 2

## 2011-04-15 MED ORDER — ENOXAPARIN SODIUM 100 MG/ML ~~LOC~~ SOLN: 100.0000 mg | Freq: Two times a day (BID) | SUBCUTANEOUS | Status: DC

## 2011-04-15 NOTE — Telephone Encounter (Signed)
Has JV cath scheduled for Wednesday, 8/1 at 1;00 with Mc[facility]aney.  Has [employer]  Checking percert

## 2011-04-15 NOTE — Patient Instructions (Addendum)
7/27- No Coumadin or Lovenox 7/28- Lovenox 100mg  in AM and PM 7/29- Lovenox 100mg  in AM and PM 7/30- Lovenox 100mg  in AM and PM 7/31- Lovenox 100mg  in AM and PM 8/1- Day of procedure.  Follow MD's instructions for restarting Coumadin and Lovenox.  When you restart, take 1 1/2 tablets of Coumadin x 2 days then resume normal dose of 1 tablet every day except 1/2 tablet on Monday, Wednesday and Friday.  Continue Lovenox 100mg  every 12 hours until next appt.  8/7- Recheck INR

## 2011-04-15 NOTE — Telephone Encounter (Signed)
No precert required 

## 2011-04-20 ENCOUNTER — Inpatient Hospital Stay (HOSPITAL_BASED_OUTPATIENT_CLINIC_OR_DEPARTMENT_OTHER)
Admission: RE | Admit: 2011-04-20 | Discharge: 2011-04-20 | Disposition: A | Discharge status: Home / Self Care | Payer: BC Managed Care – PPO | Source: Ambulatory Visit | Attending: Cardiovascular Disease | Admitting: Cardiovascular Disease

## 2011-04-20 DIAGNOSIS — I428 Other cardiomyopathies: Secondary | ICD-10-CM | POA: Insufficient documentation

## 2011-04-20 DIAGNOSIS — R5383 Other fatigue: Secondary | ICD-10-CM | POA: Insufficient documentation

## 2011-04-20 DIAGNOSIS — R079 Chest pain, unspecified: Secondary | ICD-10-CM | POA: Insufficient documentation

## 2011-04-20 DIAGNOSIS — I2510 Atherosclerotic heart disease of native coronary artery without angina pectoris: Secondary | ICD-10-CM | POA: Insufficient documentation

## 2011-04-20 DIAGNOSIS — Z9861 Coronary angioplasty status: Secondary | ICD-10-CM | POA: Insufficient documentation

## 2011-04-20 DIAGNOSIS — R5381 Other malaise: Secondary | ICD-10-CM | POA: Insufficient documentation

## 2011-04-21 ENCOUNTER — Encounter: Payer: BC Managed Care – PPO | Admitting: *Deleted

## 2011-04-24 NOTE — Cardiovascular Report (Signed)
NAME:  [name], [name] NO.:  [id number]  MEDICAL RECORD NO.:  [id number]  LOCATION:                                 FACILITY:  PHYSICIAN:  Verne Carrow, MDDATE OF BIRTH:  [birth year]  DATE OF PROCEDURE:  04/20/2011 DATE OF DISCHARGE:                           CARDIAC CATHETERIZATION   PRIMARY CARDIOLOGIST:  Learta Codding, MD, [facility]  PROCEDURES PERFORMED: 1. Left heart catheterization 2. Selective coronary angiography.  OPERATOR:  Verne Carrow, MD  INDICATIONS:  This is a 60 year old Caucasian male with a history of coronary artery disease and ischemic cardiomyopathy who has had prior coronary interventions including placement of a stent in the mid LAD and a stent in the obtuse marginal branch of the circumflex artery.  The patient has recently had increased fatigue as well as some chest discomfort and jaw pain.  Because of this, he was referred for repeat diagnostic catheterization.  DETAILS OF PROCEDURE:  The patient was brought to the outpatient cardiac catheterization laboratory after signing informed consent for the procedure.  The right groin was prepped and draped in sterile fashion. A 1% lidocaine was used for local anesthesia.  A 4-French sheath was inserted into the right femoral artery without difficulty.  We then performed selective coronary angiography with a JL-5 catheter in the left system and a 3D RC catheter in the right system.  A pigtail catheter was used to cross the aortic valve into the left ventricle. Left ventricular pressures were then measured.  No left ventricular angiogram was performed.  All catheter exchanges were performed over a wire.  There were no immediate complications.  The patient had no complaints at the time of leaving the cath lab.  The patient was taken to the recovery area in stable condition.  HEMODYNAMIC FINDINGS:  Central aortic pressure 93/54.  Left ventricular pressure 93/7/13.  ANGIOGRAPHIC  FINDINGS: 1. The left main coronary artery had no evidence of disease.  This     vessel bifurcated into circumflex, a ramus intermediate branch, and     the left anterior descending artery. 2. The left anterior descending was a large vessel that coursed to the     apex and gave off several septal perforating branches and too small     diagonal branches.  The LAD itself had mild plaque disease     throughout the proximal segment.  The mid segment had a stent that     was patent with no restenosis.  The midvessel and distal vessel had     mild plaque disease. 3. The circumflex artery had a stent present in the obtuse marginal     branch that is patent with no restenosis.  There was mild plaque in     the proximal circumflex artery. 4. The ramus intermediate was a moderate-sized vessel with no evidence     of disease. 5. The right coronary artery is a small-to-moderate-sized dominant     vessel with mild 20% plaque in the mid vessel. 6. No left ventricular angiogram was performed.  IMPRESSION: 1. Double-vessel coronary artery disease with patent stents in the mid     left anterior descending coronary artery and the obtuse marginal  branch of the circumflex artery. 2. They are currently no lesions that appeared to be obstructive. 3. Ischemic cardiomyopathy.  RECOMMENDATIONS:  We will continue medical management at this time.     Verne Carrow, MD     CM/MEDQ  D:  04/20/2011  T:  04/20/2011  Job:  [id number]  Electronically Signed by Verne Carrow MD on 04/24/2011 09:19:43 PM

## 2011-04-26 ENCOUNTER — Ambulatory Visit (INDEPENDENT_AMBULATORY_CARE_PROVIDER_SITE_OTHER): Payer: BC Managed Care – PPO | Admitting: *Deleted

## 2011-04-26 DIAGNOSIS — I6350 Cerebral infarction due to unspecified occlusion or stenosis of unspecified cerebral artery: Secondary | ICD-10-CM

## 2011-04-26 DIAGNOSIS — I4891 Unspecified atrial fibrillation: Secondary | ICD-10-CM

## 2011-04-26 DIAGNOSIS — Z7901 Long term (current) use of anticoagulants: Secondary | ICD-10-CM

## 2011-04-26 LAB — POCT INR: INR: 1.5

## 2011-04-26 MED ORDER — ENOXAPARIN SODIUM 100 MG/ML ~~LOC~~ SOLN: 100.0000 mg | Freq: Two times a day (BID) | SUBCUTANEOUS | Status: DC

## 2011-05-02 ENCOUNTER — Ambulatory Visit (INDEPENDENT_AMBULATORY_CARE_PROVIDER_SITE_OTHER): Payer: BC Managed Care – PPO | Admitting: *Deleted

## 2011-05-02 DIAGNOSIS — I6350 Cerebral infarction due to unspecified occlusion or stenosis of unspecified cerebral artery: Secondary | ICD-10-CM

## 2011-05-02 DIAGNOSIS — Z7901 Long term (current) use of anticoagulants: Secondary | ICD-10-CM

## 2011-05-02 DIAGNOSIS — I4891 Unspecified atrial fibrillation: Secondary | ICD-10-CM

## 2011-05-04 ENCOUNTER — Encounter: Payer: BC Managed Care – PPO | Admitting: *Deleted

## 2011-05-05 ENCOUNTER — Ambulatory Visit (INDEPENDENT_AMBULATORY_CARE_PROVIDER_SITE_OTHER): Payer: BC Managed Care – PPO | Admitting: *Deleted

## 2011-05-05 DIAGNOSIS — I4891 Unspecified atrial fibrillation: Secondary | ICD-10-CM

## 2011-05-05 DIAGNOSIS — I6350 Cerebral infarction due to unspecified occlusion or stenosis of unspecified cerebral artery: Secondary | ICD-10-CM

## 2011-05-05 DIAGNOSIS — Z7901 Long term (current) use of anticoagulants: Secondary | ICD-10-CM

## 2011-05-10 ENCOUNTER — Ambulatory Visit (INDEPENDENT_AMBULATORY_CARE_PROVIDER_SITE_OTHER): Payer: BC Managed Care – PPO | Admitting: Cardiology

## 2011-05-10 ENCOUNTER — Encounter: Payer: Self-pay | Admitting: Cardiology

## 2011-05-10 VITALS — BP 97/63 | HR 43 | Ht 73.0 in | Wt 224.0 lb

## 2011-05-10 DIAGNOSIS — R0989 Other specified symptoms and signs involving the circulatory and respiratory systems: Secondary | ICD-10-CM

## 2011-05-10 DIAGNOSIS — I498 Other specified cardiac arrhythmias: Secondary | ICD-10-CM

## 2011-05-10 DIAGNOSIS — R001 Bradycardia, unspecified: Secondary | ICD-10-CM

## 2011-05-10 DIAGNOSIS — I259 Chronic ischemic heart disease, unspecified: Secondary | ICD-10-CM

## 2011-05-10 DIAGNOSIS — I2589 Other forms of chronic ischemic heart disease: Secondary | ICD-10-CM

## 2011-05-10 DIAGNOSIS — R0609 Other forms of dyspnea: Secondary | ICD-10-CM

## 2011-05-10 NOTE — Patient Instructions (Signed)
   21 day Cardionet monitor If the results of your test are normal or stable, you will receive a letter.  If they are abnormal, the nurse will contact you by phone. Your physician wants you to follow up in: 6 months.  You will receive a reminder letter in the mail one-two months in advance.  If you don't receive a letter, please call our office to schedule the follow up appointment

## 2011-05-10 NOTE — Assessment & Plan Note (Signed)
Ejection fraction stable at 35%. Continue current medical therapy

## 2011-05-10 NOTE — Assessment & Plan Note (Signed)
There is a possibility the patient could have chronotropic insufficiency. At this point I really no one to decrease his bisoprolol. He does have a single lead ICD which would provide backup pacing at 40 beats per minute. However, we'll place a CardioNet monitor for 21 days to see the patient has any evidence of prolonged pauses or heart rates below 30 beats per minute. If this is the case the patient will be referred to Dr. Johney Frame for consideration of upgrade to CRT-D as I am concerned that ventricular pacing in him could lead to worsening heart failure symptoms.

## 2011-05-10 NOTE — Assessment & Plan Note (Signed)
No evidence of progressive coronary artery disease. Status post recent cardiac catheterization continue risk factor modification

## 2011-05-10 NOTE — Progress Notes (Signed)
HPI The patient is a 60 year old male with history of coronary artery disease, ischemic cardiomyopathy and prior coronary intervention including stent placement to the mid LAD, obtuse marginal branch of the circumflex coronary artery. Ejection fraction is approximately 35%. Is also status post ICD implantation with a single lead defibrillator. Symptoms for referral included fatigue, decreased exercise tolerance and right-sided jaw pain. Catheterization showed patency of his stents both of the LAD and obtuse marginal branch. Otherwise has nonobstructive lesions.   No Known Allergies  Current Outpatient Prescriptions on File Prior to Visit  Medication Sig Dispense Refill  . bisoprolol (ZEBETA) 10 MG tablet Take 1 tablet (10 mg total) by mouth daily.  30 tablet  6  . finasteride (PROSCAR) 5 MG tablet Take 5 mg by mouth Daily.       [name] losartan (COZAAR) 50 MG tablet Take 1 tablet (50 mg total) by mouth daily.  30 tablet  6  . LOVAZA 1 G capsule Take 1 capsule by mouth Twice daily.      . rosuvastatin (CRESTOR) 20 MG tablet Take 1 tablet (20 mg total) by mouth daily.  30 tablet  6  . spironolactone (ALDACTONE) 25 MG tablet Take 1 tablet (25 mg total) by mouth daily.  30 tablet  6  . warfarin (COUMADIN) 5 MG tablet Take 1/2 tablet Mondays Wednesdays and Fridays and 1 tablet all other days or as directed by anticoagulation clinic  45 tablet  2    Past Medical History  Diagnosis Date  . CAD (coronary artery disease)     w/ ischemic cardiomyopathy  . Myocardial infarction     status post prior anterior wall with ventricular remodeling   . CHF NYHA class II   . Paroxysmal atrial fibrillation     hx  . Stroke     hx on coumadin therapy  . Pseudoaneurysm   . A-V fistula   . Inducible ventricular tachycardia   . Normal echocardiogram Nov 2011    ejection fraction 35% with anterior scar  . Ejection fraction < 50%     Ejection fraction 30% w severe anterior, anteroapical, inferoapical akinesis     Past Surgical History  Procedure Date  . Bare metal stent   . Cardiac catheterization   . 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  History   Social History  . Marital Status: Married    Spouse Name: N/A    Number of Children: N/A  . Years of Education: N/A   Occupational History  . Not on file.   Social History Main Topics  . Smoking status: Never Smoker   . Smokeless tobacco: Never Used  . Alcohol Use: No  . Drug Use: Not on file  . Sexually Active: Not on file   Other Topics Concern  . Not on file   Social History Narrative  . No narrative on file   NFA:OZHYQMVHQ positives as outlined above. The remainder of the 18  point review of systems is negative  PHYSICAL EXAM BP 97/63  Pulse 43  Ht 6\' 1"  (1.854 m)  Wt 224 lb (101.606 kg)  BMI 29.55 kg/m2  General: Well-developed, well-nourished in no distress Head: Normocephalic and atraumatic Eyes:PERRLA/EOMI intact, conjunctiva and lids normal Ears: No deformity or lesions Mouth:normal dentition, normal posterior pharynx Neck: Supple, no JVD.  No masses, thyromegaly or abnormal cervical nodes Lungs: Normal breath sounds bilaterally without wheezing.  Normal percussion Cardiac: Bradycardic, regular rate  and rhythm with normal S1 and S2, no S3 or S4.  PMI is normal.  No pathological murmurs Abdomen: Normal bowel sounds, abdomen is soft and nontender without masses, organomegaly or hernias noted.  No hepatosplenomegaly MSK: Back normal, normal gait muscle strength and tone normal Vascular: Pulse is normal in all 4 extremities Extremities: No peripheral pitting edema Neurologic: Alert and oriented x 3 Skin: Intact without lesions or rashes Lymphatics: No significant adenopathy Psychologic: Normal affect   EKG: Sinus bradycardia with a heart rate of 42 beats per minute no acute ischemic changes  ASSESSMENT AND PLAN

## 2011-05-18 DIAGNOSIS — R0989 Other specified symptoms and signs involving the circulatory and respiratory systems: Secondary | ICD-10-CM

## 2011-05-18 DIAGNOSIS — R0609 Other forms of dyspnea: Secondary | ICD-10-CM

## 2011-06-02 ENCOUNTER — Ambulatory Visit (INDEPENDENT_AMBULATORY_CARE_PROVIDER_SITE_OTHER): Payer: BC Managed Care – PPO | Admitting: *Deleted

## 2011-06-02 DIAGNOSIS — I4891 Unspecified atrial fibrillation: Secondary | ICD-10-CM

## 2011-06-02 DIAGNOSIS — Z7901 Long term (current) use of anticoagulants: Secondary | ICD-10-CM

## 2011-06-02 DIAGNOSIS — I6350 Cerebral infarction due to unspecified occlusion or stenosis of unspecified cerebral artery: Secondary | ICD-10-CM

## 2011-06-02 LAB — POCT INR: INR: 1.9

## 2011-06-20 LAB — POCT I-STAT 3, VENOUS BLOOD GAS (G3P V)
Bicarbonate: 27.4 — ABNORMAL HIGH
O2 Saturation: 72
TCO2: 29
pH, Ven: 7.402 — ABNORMAL HIGH

## 2011-06-20 LAB — BASIC METABOLIC PANEL
BUN: 13
Calcium: 8.7
GFR calc non Af Amer: >60
Glucose, Bld: 144 — ABNORMAL HIGH
Sodium: 136

## 2011-06-20 LAB — PROTIME-INR
INR: 1
Prothrombin Time: 12.7
Prothrombin Time: 12.9

## 2011-06-20 LAB — CBC
Platelets: 113 — ABNORMAL LOW
RDW: 13.8

## 2011-06-20 LAB — POCT I-STAT 3, ART BLOOD GAS (G3+)
Acid-Base Excess: 4 — ABNORMAL HIGH
Bicarbonate: 29.2 — ABNORMAL HIGH
O2 Saturation: 95
pO2, Arterial: 74 — ABNORMAL LOW

## 2011-06-20 LAB — MAGNESIUM: Magnesium: 2.1

## 2011-06-30 ENCOUNTER — Encounter: Payer: BC Managed Care – PPO | Admitting: *Deleted

## 2011-07-06 ENCOUNTER — Ambulatory Visit (INDEPENDENT_AMBULATORY_CARE_PROVIDER_SITE_OTHER): Payer: BC Managed Care – PPO | Admitting: *Deleted

## 2011-07-06 DIAGNOSIS — I4891 Unspecified atrial fibrillation: Secondary | ICD-10-CM

## 2011-07-06 DIAGNOSIS — I6350 Cerebral infarction due to unspecified occlusion or stenosis of unspecified cerebral artery: Secondary | ICD-10-CM

## 2011-07-06 DIAGNOSIS — Z7901 Long term (current) use of anticoagulants: Secondary | ICD-10-CM

## 2011-07-13 ENCOUNTER — Other Ambulatory Visit: Payer: Self-pay | Admitting: *Deleted

## 2011-07-13 DIAGNOSIS — I2589 Other forms of chronic ischemic heart disease: Secondary | ICD-10-CM

## 2011-07-13 MED ORDER — BISOPROLOL FUMARATE 10 MG PO TABS: 10.0000 mg | ORAL_TABLET | Freq: Every day | ORAL | Status: DC

## 2011-07-15 ENCOUNTER — Ambulatory Visit (INDEPENDENT_AMBULATORY_CARE_PROVIDER_SITE_OTHER): Payer: BC Managed Care – PPO | Admitting: Internal Medicine

## 2011-07-15 ENCOUNTER — Encounter: Payer: Self-pay | Admitting: Internal Medicine

## 2011-07-15 DIAGNOSIS — I4891 Unspecified atrial fibrillation: Secondary | ICD-10-CM

## 2011-07-15 DIAGNOSIS — I2589 Other forms of chronic ischemic heart disease: Secondary | ICD-10-CM

## 2011-07-15 DIAGNOSIS — Z95810 Presence of automatic (implantable) cardiac defibrillator: Secondary | ICD-10-CM

## 2011-07-15 DIAGNOSIS — I519 Heart disease, unspecified: Secondary | ICD-10-CM

## 2011-07-15 LAB — ICD DEVICE OBSERVATION
BATTERY VOLTAGE: 3.0982 V
BRDY-0002RV: 40 {beats}/min
CHARGE TIME: 11.9 s
RV LEAD AMPLITUDE: 10.4 mv
RV LEAD IMPEDENCE ICD: 575 Ohm
TOT-0007: 1
TOT-0010: 14
TZAT-0013FASTVT: 1
TZAT-0020FASTVT: 1 ms
TZON-0004SLOWVT: 30
TZON-0005FASTVT: 6
TZON-0005SLOWVT: 6
TZON-0010SLOWVT: 80 ms
TZST-0001FASTVT: 2
TZST-0001FASTVT: 4
TZST-0003FASTVT: 36 J
TZST-0003FASTVT: 36 J
VF: 0

## 2011-07-15 NOTE — Assessment & Plan Note (Signed)
Normal ICD function See Arita Miss Art report No changes today   At this point, we will make no changes.  We could consider adding an atrial lead if he develops clear chronotropic incompetence.  At this time, however, I think we should try to avoid device upgrade and continue to follow him clinically.  His recent ekg reveals narrow qrs.  He is not a candidate for CRT.

## 2011-07-15 NOTE — Assessment & Plan Note (Signed)
Stable euvolemic No changes today

## 2011-07-15 NOTE — Progress Notes (Signed)
The patient presents today for routine electrophysiology followup.  Since last being seen in our clinic, the patient reports doing very well.  He remains very active.  He has occasional fatigue.  Recently, he wore an event monitor.  This reveals occasional bradycardia but no prolonged pauses or evidence of significant chronotropic incompetence.  Today, he denies symptoms of palpitations, chest pain, shortness of breath, orthopnea, PND, lower extremity edema, dizziness, presyncope, syncope, or neurologic sequela.  The patient feels that he is tolerating medications without difficulties and is otherwise without complaint today.   Past Medical History  Diagnosis Date  . CAD (coronary artery disease)     w/ ischemic cardiomyopathy  . Myocardial infarction     status post prior anterior wall with ventricular remodeling   . CHF NYHA class II   . Paroxysmal atrial fibrillation     on coumadin  . Stroke   . Pseudoaneurysm   . A-V fistula   . Inducible ventricular tachycardia   . Ejection fraction < 50%     Ejection fraction 30% w severe anterior, anteroapical, inferoapical akinesis   Past Surgical History  Procedure Date  . Bare metal stent   . Cardiac catheterization   . Aicd implantation 2011    St Jude Current CD    Current Outpatient Prescriptions  Medication Sig Dispense Refill  . aspirin 81 MG tablet Take 81 mg by mouth daily.        . bisoprolol (ZEBETA) 10 MG tablet Take 1 tablet (10 mg total) by mouth daily.  30 tablet  6  . finasteride (PROSCAR) 5 MG tablet Take 5 mg by mouth Daily.       [name] losartan (COZAAR) 50 MG tablet Take 1 tablet (50 mg total) by mouth daily.  30 tablet  6  . LOVAZA 1 G capsule Take 1 capsule by mouth Twice daily.      . rosuvastatin (CRESTOR) 20 MG tablet Take 1 tablet (20 mg total) by mouth daily.  30 tablet  6  . spironolactone (ALDACTONE) 25 MG tablet Take 1 tablet (25 mg total) by mouth daily.  30 tablet  6  . warfarin (COUMADIN) 5 MG tablet Take 1/2  tablet Mondays Wednesdays and Fridays and 1 tablet all other days or as directed by anticoagulation clinic  45 tablet  2    No Known Allergies  History   Social History  . Marital Status: Married    Spouse Name: N/A    Number of Children: N/A  . Years of Education: N/A   Occupational History  . Not on file.   Social History Main Topics  . Smoking status: Never Smoker   . Smokeless tobacco: Never Used  . Alcohol Use: No  . Drug Use: No  . Sexually Active: Not on file   Other Topics Concern  . Not on file   Social History Narrative  . No narrative on file    Physical Exam: Filed Vitals:   07/15/11 1519  BP: 101/68  Pulse: 59  Height: 6\' 1"  (1.854 m)  Weight: 225 lb (102.059 kg)    GEN- The patient is well appearing, alert and oriented x 3 today.   Head- normocephalic, atraumatic Eyes-  Sclera clear, conjunctiva pink Ears- hearing intact Oropharynx- clear Neck- supple, no JVP Lymph- no cervical lymphadenopathy Lungs- Clear to ausculation bilaterally, normal work of breathing Chest- ICD pocket is well healed Heart- Regular rate and rhythm, no murmurs, rubs or gallops, PMI not laterally displaced GI- soft,  NT, ND, + BS Extremities- no clubbing, cyanosis, or edema  ICD interrogation- reviewed in detail today,  See PACEART report  Assessment and Plan:

## 2011-07-16 ENCOUNTER — Other Ambulatory Visit: Payer: Self-pay | Admitting: Cardiology

## 2011-07-18 NOTE — Telephone Encounter (Signed)
Pt. Has Coumadin checked in [place] but Dr. Andee Lineman in cardiologist.

## 2011-07-18 NOTE — Telephone Encounter (Signed)
[place] pt. 

## 2011-07-29 ENCOUNTER — Other Ambulatory Visit: Payer: Self-pay | Admitting: *Deleted

## 2011-07-29 MED ORDER — SPIRONOLACTONE 25 MG PO TABS: 25.0000 mg | ORAL_TABLET | Freq: Every day | ORAL | Status: DC

## 2011-07-29 MED ORDER — LOSARTAN POTASSIUM 50 MG PO TABS: 50.0000 mg | ORAL_TABLET | Freq: Every day | ORAL | Status: DC

## 2011-08-03 ENCOUNTER — Encounter: Payer: BC Managed Care – PPO | Admitting: *Deleted

## 2011-08-12 ENCOUNTER — Other Ambulatory Visit: Payer: Self-pay | Admitting: *Deleted

## 2011-08-12 MED ORDER — ROSUVASTATIN CALCIUM 20 MG PO TABS: 20.0000 mg | ORAL_TABLET | Freq: Every day | ORAL | Status: DC

## 2011-08-15 ENCOUNTER — Other Ambulatory Visit: Payer: Self-pay | Admitting: *Deleted

## 2011-08-15 MED ORDER — ROSUVASTATIN CALCIUM 20 MG PO TABS: 20.0000 mg | ORAL_TABLET | Freq: Every day | ORAL | Status: DC

## 2011-08-17 ENCOUNTER — Ambulatory Visit (INDEPENDENT_AMBULATORY_CARE_PROVIDER_SITE_OTHER): Payer: BC Managed Care – PPO | Admitting: *Deleted

## 2011-08-17 DIAGNOSIS — I4891 Unspecified atrial fibrillation: Secondary | ICD-10-CM

## 2011-08-17 DIAGNOSIS — Z7901 Long term (current) use of anticoagulants: Secondary | ICD-10-CM

## 2011-08-17 DIAGNOSIS — I6350 Cerebral infarction due to unspecified occlusion or stenosis of unspecified cerebral artery: Secondary | ICD-10-CM

## 2011-08-17 LAB — POCT INR: INR: 2.3

## 2011-09-21 ENCOUNTER — Encounter: Payer: BC Managed Care – PPO | Admitting: *Deleted

## 2011-10-03 ENCOUNTER — Ambulatory Visit (INDEPENDENT_AMBULATORY_CARE_PROVIDER_SITE_OTHER): Payer: BC Managed Care – PPO | Admitting: *Deleted

## 2011-10-03 DIAGNOSIS — I6350 Cerebral infarction due to unspecified occlusion or stenosis of unspecified cerebral artery: Secondary | ICD-10-CM

## 2011-10-03 DIAGNOSIS — Z7901 Long term (current) use of anticoagulants: Secondary | ICD-10-CM

## 2011-10-03 DIAGNOSIS — I4891 Unspecified atrial fibrillation: Secondary | ICD-10-CM

## 2011-10-09 ENCOUNTER — Other Ambulatory Visit: Payer: Self-pay | Admitting: Cardiology

## 2011-10-13 ENCOUNTER — Encounter: Payer: BC Managed Care – PPO | Admitting: *Deleted

## 2011-10-17 ENCOUNTER — Encounter: Payer: Self-pay | Admitting: *Deleted

## 2011-10-27 ENCOUNTER — Ambulatory Visit (INDEPENDENT_AMBULATORY_CARE_PROVIDER_SITE_OTHER): Payer: BC Managed Care – PPO | Admitting: *Deleted

## 2011-10-27 DIAGNOSIS — I259 Chronic ischemic heart disease, unspecified: Secondary | ICD-10-CM

## 2011-10-27 DIAGNOSIS — Z95810 Presence of automatic (implantable) cardiac defibrillator: Secondary | ICD-10-CM

## 2011-10-28 ENCOUNTER — Encounter: Payer: Self-pay | Admitting: Internal Medicine

## 2011-10-28 LAB — REMOTE ICD DEVICE
BATTERY VOLTAGE: 3.07 V
HV IMPEDENCE: 53 Ohm
RV LEAD IMPEDENCE ICD: 630 Ohm
TZAT-0012FASTVT: 200 ms
TZAT-0013FASTVT: 1
TZAT-0018FASTVT: NEGATIVE
TZAT-0020FASTVT: 1 ms
TZON-0003FASTVT: 285 ms
TZON-0003SLOWVT: 425 ms
TZON-0004SLOWVT: 30
TZON-0005FASTVT: 6
TZON-0005SLOWVT: 6
TZST-0001FASTVT: 2
TZST-0003FASTVT: 36 J
TZST-0003FASTVT: 36 J
TZST-0003FASTVT: 36 J
VENTRICULAR PACING ICD: 1.6 pct

## 2011-11-04 NOTE — Progress Notes (Signed)
icd remote  

## 2011-11-07 ENCOUNTER — Encounter: Payer: Self-pay | Admitting: *Deleted

## 2011-11-11 ENCOUNTER — Other Ambulatory Visit: Payer: Self-pay | Admitting: *Deleted

## 2011-11-11 MED ORDER — COUMADIN 5 MG PO TABS: ORAL_TABLET | ORAL | Status: DC

## 2011-11-14 ENCOUNTER — Ambulatory Visit (INDEPENDENT_AMBULATORY_CARE_PROVIDER_SITE_OTHER): Payer: BC Managed Care – PPO | Admitting: *Deleted

## 2011-11-14 DIAGNOSIS — I4891 Unspecified atrial fibrillation: Secondary | ICD-10-CM

## 2011-11-14 DIAGNOSIS — I6350 Cerebral infarction due to unspecified occlusion or stenosis of unspecified cerebral artery: Secondary | ICD-10-CM

## 2011-11-14 DIAGNOSIS — Z7901 Long term (current) use of anticoagulants: Secondary | ICD-10-CM

## 2011-11-14 LAB — POCT INR: INR: 1.9

## 2011-12-12 ENCOUNTER — Ambulatory Visit (INDEPENDENT_AMBULATORY_CARE_PROVIDER_SITE_OTHER): Payer: BC Managed Care – PPO | Admitting: *Deleted

## 2011-12-12 DIAGNOSIS — Z7901 Long term (current) use of anticoagulants: Secondary | ICD-10-CM

## 2011-12-12 DIAGNOSIS — I6350 Cerebral infarction due to unspecified occlusion or stenosis of unspecified cerebral artery: Secondary | ICD-10-CM

## 2011-12-12 DIAGNOSIS — I4891 Unspecified atrial fibrillation: Secondary | ICD-10-CM

## 2012-01-09 ENCOUNTER — Ambulatory Visit (INDEPENDENT_AMBULATORY_CARE_PROVIDER_SITE_OTHER): Payer: BC Managed Care – PPO | Admitting: *Deleted

## 2012-01-09 DIAGNOSIS — I6350 Cerebral infarction due to unspecified occlusion or stenosis of unspecified cerebral artery: Secondary | ICD-10-CM

## 2012-01-09 DIAGNOSIS — Z7901 Long term (current) use of anticoagulants: Secondary | ICD-10-CM

## 2012-01-09 DIAGNOSIS — I4891 Unspecified atrial fibrillation: Secondary | ICD-10-CM

## 2012-01-24 ENCOUNTER — Ambulatory Visit (INDEPENDENT_AMBULATORY_CARE_PROVIDER_SITE_OTHER): Payer: BC Managed Care – PPO | Admitting: Urology

## 2012-01-24 DIAGNOSIS — N400 Enlarged prostate without lower urinary tract symptoms: Secondary | ICD-10-CM

## 2012-01-24 DIAGNOSIS — R351 Nocturia: Secondary | ICD-10-CM

## 2012-01-26 ENCOUNTER — Ambulatory Visit (INDEPENDENT_AMBULATORY_CARE_PROVIDER_SITE_OTHER): Payer: BC Managed Care – PPO | Admitting: *Deleted

## 2012-01-26 ENCOUNTER — Encounter: Payer: Self-pay | Admitting: Internal Medicine

## 2012-01-26 DIAGNOSIS — I2589 Other forms of chronic ischemic heart disease: Secondary | ICD-10-CM

## 2012-01-26 LAB — REMOTE ICD DEVICE
BATTERY VOLTAGE: 3.02 V
BRDY-0002RV: 40 {beats}/min
DEV-0020ICD: NEGATIVE
DEVICE MODEL ICD: 557946
TZAT-0001FASTVT: 1
TZAT-0004FASTVT: 8
TZAT-0012FASTVT: 200 ms
TZAT-0013FASTVT: 1
TZAT-0020FASTVT: 1 ms
TZON-0003SLOWVT: 425 ms
TZON-0004SLOWVT: 30
TZON-0005FASTVT: 6
TZON-0005SLOWVT: 6
TZST-0001FASTVT: 5
TZST-0003FASTVT: 30 J
TZST-0003FASTVT: 36 J
TZST-0003FASTVT: 36 J
TZST-0003FASTVT: 36 J

## 2012-02-03 ENCOUNTER — Encounter: Payer: Self-pay | Admitting: Internal Medicine

## 2012-02-03 ENCOUNTER — Encounter: Payer: Self-pay | Admitting: *Deleted

## 2012-02-10 NOTE — Progress Notes (Signed)
PPM remote 

## 2012-02-11 ENCOUNTER — Other Ambulatory Visit: Payer: Self-pay | Admitting: Cardiology

## 2012-02-20 ENCOUNTER — Ambulatory Visit (INDEPENDENT_AMBULATORY_CARE_PROVIDER_SITE_OTHER): Payer: BC Managed Care – PPO | Admitting: *Deleted

## 2012-02-20 DIAGNOSIS — Z7901 Long term (current) use of anticoagulants: Secondary | ICD-10-CM

## 2012-02-20 DIAGNOSIS — I6350 Cerebral infarction due to unspecified occlusion or stenosis of unspecified cerebral artery: Secondary | ICD-10-CM

## 2012-02-20 DIAGNOSIS — I4891 Unspecified atrial fibrillation: Secondary | ICD-10-CM

## 2012-03-04 ENCOUNTER — Other Ambulatory Visit: Payer: Self-pay | Admitting: Cardiology

## 2012-03-08 ENCOUNTER — Ambulatory Visit (INDEPENDENT_AMBULATORY_CARE_PROVIDER_SITE_OTHER): Payer: BC Managed Care – PPO | Admitting: Cardiology

## 2012-03-08 ENCOUNTER — Encounter: Payer: Self-pay | Admitting: Cardiology

## 2012-03-08 VITALS — BP 108/66 | HR 47 | Ht 74.0 in | Wt 228.8 lb

## 2012-03-08 DIAGNOSIS — I5033 Acute on chronic diastolic (congestive) heart failure: Secondary | ICD-10-CM

## 2012-03-08 DIAGNOSIS — E785 Hyperlipidemia, unspecified: Secondary | ICD-10-CM

## 2012-03-08 DIAGNOSIS — I2589 Other forms of chronic ischemic heart disease: Secondary | ICD-10-CM

## 2012-03-08 DIAGNOSIS — I4891 Unspecified atrial fibrillation: Secondary | ICD-10-CM

## 2012-03-08 DIAGNOSIS — I259 Chronic ischemic heart disease, unspecified: Secondary | ICD-10-CM

## 2012-03-08 DIAGNOSIS — I6350 Cerebral infarction due to unspecified occlusion or stenosis of unspecified cerebral artery: Secondary | ICD-10-CM

## 2012-03-08 DIAGNOSIS — R635 Abnormal weight gain: Secondary | ICD-10-CM | POA: Insufficient documentation

## 2012-03-08 DIAGNOSIS — R0989 Other specified symptoms and signs involving the circulatory and respiratory systems: Secondary | ICD-10-CM

## 2012-03-08 DIAGNOSIS — R0602 Shortness of breath: Secondary | ICD-10-CM

## 2012-03-08 NOTE — Patient Instructions (Addendum)
Your physician recommends that you go to the [facility] lab for blood work for [employer], Magnesium, & BNP Our office will notify of results Your physician wants you to follow up in: 6 months.  You will receive a reminder letter in the mail one-two months in advance.  If you don't receive a letter, please call our office to schedule the follow up appointment

## 2012-03-08 NOTE — Assessment & Plan Note (Signed)
Followed by the patient's primary care physician. Adjustments are made as needed.

## 2012-03-08 NOTE — Assessment & Plan Note (Addendum)
No recurrence. The patient is in normal sinus rhythm. The patient has been evaluated for chronotropic insufficiency.

## 2012-03-08 NOTE — Assessment & Plan Note (Signed)
No recurrent chest pain. However the patient is getting quite a bit of weight in order 7-8 pounds. I do not think that he has any significant fluid overload but we'll proceed with a BNP level. In addition the patient has not have his electrolytes checked in quite a while and we will get a BMET and magnesium level.

## 2012-03-08 NOTE — Assessment & Plan Note (Signed)
Continue aspirin because of prior coronary intervention as well as Coumadin for his atrial fibrillation and stroke prevention.

## 2012-03-08 NOTE — Assessment & Plan Note (Signed)
Status post prior LV thrombus.

## 2012-03-08 NOTE — Assessment & Plan Note (Signed)
No clear evidence of volume overload but we will check a BNP level.

## 2012-03-08 NOTE — Progress Notes (Signed)
Marcus Bottoms, MD, [facility] ABIM Board Certified in Adult Cardiovascular Medicine,Internal Medicine and Critical Care Medicine    CC: Followup patient ischemic cardiomyopathy  HPI:  The patient states that he has gained quite a bit of weight. Has gained approximately 8 pounds and he feels that his exercise tolerance has decreased. 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 He got very short of breath only doing 2 flights of stairs. His primary care physician also reported that his fasting blood sugars have increased. In general the patient does not appear to be volume overloaded. He remains in NYHA class IIb. He reports no palpitations presyncope or syncope. He has no orthopnea PND. 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 He will need late at night frequently with snacking  PMH: reviewed and listed in Problem List in Electronic Records (and see below) Past Medical History  Diagnosis Date  . CAD (coronary artery disease)     w/ ischemic cardiomyopathy  . Myocardial infarction     status post prior anterior wall with ventricular remodeling   . CHF NYHA class II   . Paroxysmal atrial fibrillation     on coumadin  . Stroke   . Pseudoaneurysm   . A-V fistula   . Inducible ventricular tachycardia   . Ejection fraction < 50%     Ejection fraction 30% w severe anterior, anteroapical, inferoapical akinesis   Past Surgical History  Procedure Date  . Bare metal stent   . Cardiac catheterization   . Aicd implantation 2011    St Jude Current CD    Allergies/SH/FHX : available in Electronic Records for review  No Known Allergies History   Social History  . Marital Status: Married    Spouse Name: N/A    Number of Children: N/A  . Years of Education: N/A   Occupational History  . Not on file.   Social History Main Topics  . Smoking status: Never Smoker   . Smokeless tobacco: Never  Used  . Alcohol Use: No  . Drug Use: No  . Sexually Active: Not on file   Other Topics Concern  . Not on file   Social History Narrative  . No narrative on file   No family history on file.  Medications: Current Outpatient Prescriptions  Medication Sig Dispense Refill  . aspirin 81 MG tablet Take 81 mg by mouth daily.        . bisoprolol (ZEBETA) 10 MG tablet TAKE 1 TABLET EVERY DAY  30 tablet  6  . COUMADIN 5 MG tablet TAKE 1/2 TAB MON WED & FRI AND 1 TAB ALL OTHER DAYS OR AS DIRECTED BY [facility]  45 tablet  2  . finasteride (PROSCAR) 5 MG tablet Take 5 mg by mouth Daily.       [name] losartan (COZAAR) 50 MG tablet TAKE 1 TABLET BY MOUTH EVERY DAY  30 tablet  6  . LOVAZA 1 G capsule Take 1 capsule by mouth Twice daily.      . rosuvastatin (CRESTOR) 20 MG tablet Take 1 tablet (20 mg total) by mouth daily.  30 tablet  6  . spironolactone (ALDACTONE) 25 MG tablet TAKE 1 TABLET BY MOUTH EVERY DAY  30 tablet  6    ROS: No nausea or vomiting. No fever or chills.No melena or hematochezia.No bleeding.No claudication  Physical Exam: BP 108/66  Pulse 47  Ht 6\' 2"  (1.88 m)  Wt 228 lb 12.8 oz (103.783 kg)  BMI 29.38 kg/m2 General: Well-nourished white male overweight. BMI 29 Neck: Normal carotid upstroke no carotid bruits no thyromegaly nonnodular thyroid. JVP is 6-7 cm Lungs: Clear breath sounds bilaterally with no wheezing. Cardiac: Regular rate and rhythm with normal S1 and S2 no pathological murmurs. No definite S3 Chest: Defibrillator pocket without any erythema or swelling Vascular: No edema. Normal distal pulses Skin: Warm and dry Physcologic: Normal affect  12lead ECG: Sinus bradycardia heart rate 47 beats per minute Q waves in secondary to prior anterior wall myocardial infarction. Limited bedside ECHO:N/A No images are attached to the encounter.   Assessment and Plan  ATRIAL FIBRILLATION No recurrence. The patient is in normal sinus rhythm. The patient has been evaluated for  chronotropic insufficiency.  CARDIOMYOPATHY, ISCHEMIC No recurrent chest pain. However the patient is getting quite a bit of weight in order 7-8 pounds. I do not think that he has any significant fluid overload but we'll proceed with a BNP level. In addition the patient has not have his electrolytes checked in quite a while and we will get a BMET and magnesium level.  CORONARY ARTERY DISEASE, S/P PTCA Continue aspirin because of prior coronary intervention as well as Coumadin for his atrial fibrillation and stroke prevention.  CVA Status post prior LV thrombus.  DYSPNEA ON EXERTION Reports dyspnea on exertion but remains in Gen. NYHA class IIb. Patient states that he can get up the steps quickly because of shortness of breath which he feels was related to his weight gain.  SHORTNESS OF BREATH No clear evidence of volume overload but we will check a BNP level.  Weight gain I instructed the patient to cut back on his intake of complex carbohydrates as well as simple sugars. I told him to follow a Mediterranean's style diet. He needs to decrease food portions but mainly cut back on his carbohydrates as his sugars are starting to be elevated also.  DYSLIPIDEMIA Followed by the patient's primary care physician. Adjustments are made as needed.   Patient Active Problem List  Diagnosis  . DYSLIPIDEMIA  . PSEUDOANEURYSM  . CARDIOMYOPATHY, ISCHEMIC  . CORONARY ARTERY DISEASE, S/P PTCA  . ATRIAL FIBRILLATION  . CVA  . AV FISTULA  . DYSPNEA ON EXERTION  . Chronic systolic dysfunction of left ventricle  . SHORTNESS OF BREATH  . Encounter for long-term (current) use of anticoagulants  . ICD (implantable cardiac defibrillator) in place  . Weight gain

## 2012-03-08 NOTE — Assessment & Plan Note (Signed)
I instructed the patient to cut back on his intake of complex carbohydrates as well as simple sugars. I told him to follow a Mediterranean's style diet. He needs to decrease food portions but mainly cut back on his carbohydrates as his sugars are starting to be elevated also.

## 2012-03-08 NOTE — Assessment & Plan Note (Signed)
Reports dyspnea on exertion but remains in Gen. NYHA class IIb. Patient states that he can get up the steps quickly because of shortness of breath which he feels was related to his weight gain.

## 2012-03-20 ENCOUNTER — Other Ambulatory Visit: Payer: Self-pay | Admitting: Cardiology

## 2012-03-26 ENCOUNTER — Ambulatory Visit (INDEPENDENT_AMBULATORY_CARE_PROVIDER_SITE_OTHER): Payer: BC Managed Care – PPO | Admitting: *Deleted

## 2012-03-26 DIAGNOSIS — I4891 Unspecified atrial fibrillation: Secondary | ICD-10-CM

## 2012-03-26 DIAGNOSIS — Z7901 Long term (current) use of anticoagulants: Secondary | ICD-10-CM

## 2012-03-26 DIAGNOSIS — I6350 Cerebral infarction due to unspecified occlusion or stenosis of unspecified cerebral artery: Secondary | ICD-10-CM

## 2012-03-26 LAB — POCT INR: INR: 2.7

## 2012-03-29 ENCOUNTER — Telehealth: Payer: Self-pay | Admitting: *Deleted

## 2012-03-29 NOTE — Telephone Encounter (Signed)
Patient notified of normal labs from 6/26.

## 2012-04-09 ENCOUNTER — Other Ambulatory Visit: Payer: Self-pay | Admitting: Cardiology

## 2012-04-23 ENCOUNTER — Ambulatory Visit (INDEPENDENT_AMBULATORY_CARE_PROVIDER_SITE_OTHER): Payer: BC Managed Care – PPO | Admitting: *Deleted

## 2012-04-23 DIAGNOSIS — Z7901 Long term (current) use of anticoagulants: Secondary | ICD-10-CM

## 2012-04-23 DIAGNOSIS — I4891 Unspecified atrial fibrillation: Secondary | ICD-10-CM

## 2012-04-23 DIAGNOSIS — I6350 Cerebral infarction due to unspecified occlusion or stenosis of unspecified cerebral artery: Secondary | ICD-10-CM

## 2012-05-03 ENCOUNTER — Ambulatory Visit (INDEPENDENT_AMBULATORY_CARE_PROVIDER_SITE_OTHER): Payer: BC Managed Care – PPO | Admitting: *Deleted

## 2012-05-03 ENCOUNTER — Encounter: Payer: Self-pay | Admitting: Internal Medicine

## 2012-05-03 DIAGNOSIS — I2589 Other forms of chronic ischemic heart disease: Secondary | ICD-10-CM

## 2012-05-03 DIAGNOSIS — I259 Chronic ischemic heart disease, unspecified: Secondary | ICD-10-CM

## 2012-05-07 ENCOUNTER — Encounter: Payer: Self-pay | Admitting: *Deleted

## 2012-05-07 LAB — REMOTE ICD DEVICE
DEV-0020ICD: NEGATIVE
RV LEAD IMPEDENCE ICD: 650 Ohm
TZAT-0004FASTVT: 8
TZAT-0012FASTVT: 200 ms
TZAT-0013FASTVT: 1
TZAT-0018FASTVT: NEGATIVE
TZON-0003FASTVT: 285 ms
TZON-0004SLOWVT: 30
TZON-0005SLOWVT: 6
TZST-0001FASTVT: 3
TZST-0003FASTVT: 36 J
TZST-0003FASTVT: 36 J
VENTRICULAR PACING ICD: 1.9 pct

## 2012-05-08 ENCOUNTER — Encounter: Payer: Self-pay | Admitting: *Deleted

## 2012-06-04 ENCOUNTER — Ambulatory Visit (INDEPENDENT_AMBULATORY_CARE_PROVIDER_SITE_OTHER): Payer: BC Managed Care – PPO | Admitting: *Deleted

## 2012-06-04 DIAGNOSIS — Z7901 Long term (current) use of anticoagulants: Secondary | ICD-10-CM

## 2012-06-04 DIAGNOSIS — I6350 Cerebral infarction due to unspecified occlusion or stenosis of unspecified cerebral artery: Secondary | ICD-10-CM

## 2012-06-04 DIAGNOSIS — I4891 Unspecified atrial fibrillation: Secondary | ICD-10-CM

## 2012-06-04 LAB — POCT INR: INR: 3.9

## 2012-07-04 ENCOUNTER — Ambulatory Visit (INDEPENDENT_AMBULATORY_CARE_PROVIDER_SITE_OTHER): Payer: BC Managed Care – PPO | Admitting: *Deleted

## 2012-07-04 DIAGNOSIS — Z7901 Long term (current) use of anticoagulants: Secondary | ICD-10-CM

## 2012-07-04 DIAGNOSIS — I4891 Unspecified atrial fibrillation: Secondary | ICD-10-CM

## 2012-07-04 DIAGNOSIS — I6350 Cerebral infarction due to unspecified occlusion or stenosis of unspecified cerebral artery: Secondary | ICD-10-CM

## 2012-07-04 LAB — POCT INR: INR: 3.8

## 2012-07-11 ENCOUNTER — Ambulatory Visit (INDEPENDENT_AMBULATORY_CARE_PROVIDER_SITE_OTHER): Payer: BC Managed Care – PPO | Admitting: Physician Assistant

## 2012-07-11 ENCOUNTER — Encounter: Payer: Self-pay | Admitting: Physician Assistant

## 2012-07-11 VITALS — BP 104/66 | HR 44 | Ht 73.5 in | Wt 228.1 lb

## 2012-07-11 DIAGNOSIS — I4891 Unspecified atrial fibrillation: Secondary | ICD-10-CM

## 2012-07-11 DIAGNOSIS — Z01810 Encounter for preprocedural cardiovascular examination: Secondary | ICD-10-CM

## 2012-07-11 DIAGNOSIS — R0609 Other forms of dyspnea: Secondary | ICD-10-CM

## 2012-07-11 DIAGNOSIS — Z95810 Presence of automatic (implantable) cardiac defibrillator: Secondary | ICD-10-CM

## 2012-07-11 DIAGNOSIS — R001 Bradycardia, unspecified: Secondary | ICD-10-CM | POA: Insufficient documentation

## 2012-07-11 DIAGNOSIS — I498 Other specified cardiac arrhythmias: Secondary | ICD-10-CM

## 2012-07-11 MED ORDER — NITROGLYCERIN 0.4 MG SL SUBL: 0.4000 mg | SUBLINGUAL_TABLET | SUBLINGUAL | Status: DC | PRN

## 2012-07-11 MED ORDER — BISOPROLOL FUMARATE 5 MG PO TABS: 5.0000 mg | ORAL_TABLET | Freq: Every day | ORAL | Status: DC

## 2012-07-11 NOTE — Assessment & Plan Note (Signed)
Will reduce bisoprolol dose to 5 mg daily, to start in a.m. Scheduled dose to be held today.

## 2012-07-11 NOTE — Patient Instructions (Signed)
   Nitroglycerin - as needed for severe chest pain only  Decrease Bisoprolol to 5mg  daily.  Hold today, then begin this new dose tomorrow.   Continue all other current medications.  Left JV Cath   Follow up to be given after procedure above

## 2012-07-11 NOTE — Assessment & Plan Note (Signed)
Followed by Dr. Johney Frame. Patient denies any recent ICD shocks.

## 2012-07-11 NOTE — Assessment & Plan Note (Addendum)
Maintaining NSR. Patient on chronic Coumadin, which will be held prior to catheterization. Of note, he will require bridging with Lovenox, given history of prior stroke.

## 2012-07-11 NOTE — Assessment & Plan Note (Signed)
I'm concerned that this represents his anginal equivalent. Moreover, he does have some occasional associated chest and mid scapular tightness. Therefore, I recommend elective diagnostic cardiac catheterization, with which patient concurs. The risks/benefits were discussed, and plan was approved by Dr. Diona Browner.

## 2012-07-11 NOTE — Progress Notes (Signed)
Primary Cardiologist: Marcus McDowell, MD (new)   HPI: Patient presents for evaluation of dyspnea and dizziness.   - Last 2-D echo, November 2011: EF 35%, anteroseptal/apical AK   - Last Myoview, November 2011: Large anterior/anteroseptal scar; medium inferoapical scar; no definite ischemia; EF 35%.  Patient reports one month history of DOE with occasional associated left chest tightness, as well as mid scapular discomfort. He denies such symptoms at rest. He denies orthopnea, PND, or LE edema. He denies any ICD shocks. He suggests some similarity of symptoms to prior ischemic events, including MI 18 years ago and pre-PCI presentation, approximately 3 years ago.  Patient also has occasional dizziness, but no frank syncope. He presents with marked bradycardia in the 40 bpm range.   12-lead EKG today, reviewed by me, indicates SB 44 bpm; LAD; probable prior inferior/anteroseptal MI; nonspecific ST segment changes   No Known Allergies  Current Outpatient Prescriptions  Medication Sig Dispense Refill  . aspirin 81 MG tablet Take 81 mg by mouth daily.        . bisoprolol (ZEBETA) 10 MG tablet TAKE 1 TABLET EVERY DAY  30 tablet  6  . COUMADIN 5 MG tablet TAKE 1/2 TAB MON WED & FRI AND 1 TAB ALL OTHER DAYS OR AS DIRECTED BY CLINIC  45 tablet  3  . CRESTOR 20 MG tablet TAKE 1 TABLET BY MOUTH EVERY DAY  30 tablet  6  . finasteride (PROSCAR) 5 MG tablet Take 5 mg by mouth Daily.       . losartan (COZAAR) 50 MG tablet TAKE 1 TABLET BY MOUTH EVERY DAY  30 tablet  6  . LOVAZA 1 G capsule Take 1 capsule by mouth Twice daily.      . spironolactone (ALDACTONE) 25 MG tablet TAKE 1 TABLET BY MOUTH EVERY DAY  30 tablet  6    Past Medical History  Diagnosis Date  . CAD (coronary artery disease)     w/ ischemic cardiomyopathy  . Myocardial infarction     status post prior anterior wall with ventricular remodeling   . CHF NYHA class II   . Paroxysmal atrial fibrillation     on coumadin  . Stroke   .  Pseudoaneurysm   . A-V fistula   . Inducible ventricular tachycardia   . Ejection fraction < 50%     Ejection fraction 30% w severe anterior, anteroapical, inferoapical akinesis    Past Surgical History  Procedure Date  . Bare metal stent   . Cardiac catheterization   . Aicd implantation 2011    St Jude Current CD    History   Social History  . Marital Status: Married    Spouse Name: N/A    Number of Children: N/A  . Years of Education: N/A   Occupational History  . Not on file.   Social History Main Topics  . Smoking status: Never Smoker   . Smokeless tobacco: Never Used  . Alcohol Use: No  . Drug Use: No  . Sexually Active: Not on file   Other Topics Concern  . Not on file   Social History Narrative  . No narrative on file    No family history on file.  ROS: no nausea, vomiting; no fever, chills; no melena, hematochezia; no claudication  PHYSICAL EXAM: BP 104/66  Pulse 44  Ht 6' 1.5" (1.867 m)  Wt 228 lb 1.9 oz (103.475 kg)  BMI 29.69 kg/m2 GENERAL: 61 year-old male; NAD HEENT: NCAT, PERRLA, EOMI; sclera   clear; no xanthelasma NECK: palpable bilateral carotid pulses, no bruits; no JVD; no TM LUNGS: CTA bilaterally CARDIAC: RRR (S1, S2); no significant murmurs; no rubs or gallops ABDOMEN: Protuberant EXTREMETIES: Palpable femoral pulses, no bruits; intact distal pulses; no significant peripheral edema SKIN: warm/dry; no obvious rash/lesions MUSCULOSKELETAL: no joint deformity NEURO: no focal deficit; NL affect   EKG: reviewed and available in Electronic Records   ASSESSMENT & PLAN:  DYSPNEA ON EXERTION I'm concerned that this represents his anginal equivalent. Moreover, he does have some occasional associated chest and mid scapular tightness. Therefore, I recommend elective diagnostic cardiac catheterization, with which patient concurs. The risks/benefits were discussed, and plan was approved by Dr. McDowell.  ATRIAL FIBRILLATION Maintaining NSR.  Patient on chronic Coumadin, which will be held prior to catheterization. Of note, he will require bridging with Lovenox, given history of prior stroke.  ICD -St.Jude Followed by Dr. Allred. Patient denies any recent ICD shocks.  Bradycardia Will reduce bisoprolol dose to 5 mg daily, to start in a.m. Scheduled dose to be held today.    [name], PAC  

## 2012-07-13 ENCOUNTER — Other Ambulatory Visit: Payer: Self-pay | Admitting: Physician Assistant

## 2012-07-13 ENCOUNTER — Ambulatory Visit (INDEPENDENT_AMBULATORY_CARE_PROVIDER_SITE_OTHER): Payer: BC Managed Care – PPO | Admitting: *Deleted

## 2012-07-13 ENCOUNTER — Encounter: Payer: Self-pay | Admitting: *Deleted

## 2012-07-13 DIAGNOSIS — I4891 Unspecified atrial fibrillation: Secondary | ICD-10-CM

## 2012-07-13 DIAGNOSIS — I6350 Cerebral infarction due to unspecified occlusion or stenosis of unspecified cerebral artery: Secondary | ICD-10-CM

## 2012-07-13 DIAGNOSIS — R079 Chest pain, unspecified: Secondary | ICD-10-CM

## 2012-07-13 DIAGNOSIS — Z7901 Long term (current) use of anticoagulants: Secondary | ICD-10-CM

## 2012-07-13 MED ORDER — ENOXAPARIN SODIUM 150 MG/ML ~~LOC~~ SOLN: 150.0000 mg | Freq: Every day | SUBCUTANEOUS | Status: DC

## 2012-07-13 NOTE — Patient Instructions (Signed)
10/26  Last dose of coumadin 10/27  No lovenox or coumadin 10/28 Lovenox 150mg  8am 10/29 Lovenox 150mg  8am 10/30  Lovenox 150mg  8am 10/31  Lovenox 150mg  8am 11/1  No Lovenox    Cath   Warfarin 5mg  hs 11/2  Lovenox 150mg  8am and Warfarin 7.5mg  hs 11/3  Lovenox 150mg  8am and Warfarin 7.5mg  hs 11/4  Lovenox 150mg  8am  And INR check

## 2012-07-18 LAB — PROTIME-INR

## 2012-07-20 ENCOUNTER — Encounter (HOSPITAL_BASED_OUTPATIENT_CLINIC_OR_DEPARTMENT_OTHER)
Admission: RE | Disposition: A | Discharge status: Home / Self Care | Payer: Self-pay | Source: Ambulatory Visit | Attending: Cardiovascular Disease

## 2012-07-20 ENCOUNTER — Inpatient Hospital Stay (HOSPITAL_BASED_OUTPATIENT_CLINIC_OR_DEPARTMENT_OTHER)
Admission: RE | Admit: 2012-07-20 | Discharge: 2012-07-20 | Disposition: A | Discharge status: Home / Self Care | Payer: BC Managed Care – PPO | Source: Ambulatory Visit | Attending: Cardiovascular Disease | Admitting: Cardiovascular Disease

## 2012-07-20 DIAGNOSIS — R0609 Other forms of dyspnea: Secondary | ICD-10-CM | POA: Insufficient documentation

## 2012-07-20 DIAGNOSIS — Z95810 Presence of automatic (implantable) cardiac defibrillator: Secondary | ICD-10-CM | POA: Insufficient documentation

## 2012-07-20 DIAGNOSIS — I4891 Unspecified atrial fibrillation: Secondary | ICD-10-CM | POA: Insufficient documentation

## 2012-07-20 DIAGNOSIS — I2589 Other forms of chronic ischemic heart disease: Secondary | ICD-10-CM | POA: Insufficient documentation

## 2012-07-20 DIAGNOSIS — R0989 Other specified symptoms and signs involving the circulatory and respiratory systems: Secondary | ICD-10-CM | POA: Insufficient documentation

## 2012-07-20 DIAGNOSIS — I2510 Atherosclerotic heart disease of native coronary artery without angina pectoris: Secondary | ICD-10-CM | POA: Insufficient documentation

## 2012-07-20 DIAGNOSIS — R079 Chest pain, unspecified: Secondary | ICD-10-CM | POA: Insufficient documentation

## 2012-07-20 DIAGNOSIS — Z9861 Coronary angioplasty status: Secondary | ICD-10-CM | POA: Insufficient documentation

## 2012-07-20 SURGERY — JV LEFT HEART CATHETERIZATION WITH CORONARY ANGIOGRAM
Anesthesia: Moderate Sedation

## 2012-07-20 MED ORDER — SODIUM CHLORIDE 0.9 % IJ SOLN: 3.0000 mL | Freq: Two times a day (BID) | INTRAMUSCULAR | Status: DC

## 2012-07-20 MED ORDER — ASPIRIN 81 MG PO CHEW
324.0000 mg | CHEWABLE_TABLET | ORAL | Status: AC
  Administered 2012-07-20: 324 mg via ORAL

## 2012-07-20 MED ORDER — SODIUM CHLORIDE 0.9 % IV SOLN: 250.0000 mL | INTRAVENOUS | Status: DC | PRN

## 2012-07-20 MED ORDER — SODIUM CHLORIDE 0.9 % IV SOLN: INTRAVENOUS | Status: DC

## 2012-07-20 MED ORDER — SODIUM CHLORIDE 0.9 % IV SOLN: INTRAVENOUS | Status: AC

## 2012-07-20 MED ORDER — ACETAMINOPHEN 325 MG PO TABS: 650.0000 mg | ORAL_TABLET | ORAL | Status: DC | PRN

## 2012-07-20 MED ORDER — SODIUM CHLORIDE 0.9 % IJ SOLN: 3.0000 mL | INTRAMUSCULAR | Status: DC | PRN

## 2012-07-20 NOTE — Progress Notes (Signed)
Bedrest begins @ 0920.  Tegaderm dressing applied to right groin site,  Site level 0.[name]

## 2012-07-20 NOTE — OR Nursing (Signed)
Meal served 

## 2012-07-20 NOTE — Interval H&P Note (Signed)
History and Physical Interval Note:  07/20/2012 8:26 AM  [name]  has presented today for cardiac cath  with the diagnosis of c/p, sob  The various methods of treatment have been discussed with the patient and family. After consideration of risks, benefits and other options for treatment, the patient has consented to  Procedure(s) (LRB) with comments: JV LEFT HEART CATHETERIZATION WITH CORONARY ANGIOGRAM (N/A) as a surgical intervention .  The patient's history has been reviewed, patient examined, no change in status, stable for surgery.  I have reviewed the patient's chart and labs.  Questions were answered to the patient's satisfaction.     Damar Petit

## 2012-07-20 NOTE — OR Nursing (Signed)
Discharge instructions reviewed and signed, pt stated understanding, ambulated in hall without difficulty, site level 0, transported to wife's car via wheelcghair

## 2012-07-20 NOTE — CV Procedure (Signed)
   Cardiac Catheterization Operative Report  [name] [id number] 11/1/20139:04 AM Selinda Flavin, MD  Procedure Performed:  1. Left Heart Catheterization 2. Selective Coronary Angiography 3. Left ventricular angiogram  Operator: Verne Carrow, MD  Indication:  61 yo male with history of CAD with ischemic cardiomyopathy, LVEF known to be 35% with recent c/o dyspnea on exertion, mild chest pains.                                  Procedure Details: The risks, benefits, complications, treatment options, and expected outcomes were discussed with the patient. The patient and/or family concurred with the proposed plan, giving informed consent. The patient was brought to the cath lab after IV hydration was begun and oral premedication was given. The patient was further sedated with Versed and Fentanyl. The right groin was prepped and draped in the usual manner. Using the modified Seldinger access technique, a 4 French sheath was placed in the right femoral artery. A 3DRC catheter was used to engage the RCA. A JL5 catheter was used to engage the left main artery. A pigtail catheter was used to perform a left ventricular angiogram.  There were no immediate complications. The patient was taken to the recovery area in stable condition.   Hemodynamic Findings: Central aortic pressure: 97/53 Left ventricular pressure: 98/8/21  Angiographic Findings:  Left main: No obstructive disease.   Left Anterior Descending Artery: Large caliber vessel that courses to the apex. There is mild plaque in the proximal segment. The mid vessel has a patent stent with mild, 10% in-stent restenosis. There are two small caliber diagonal branches with no disease.   Ramus Intermediate: Moderate caliber vessel with mild plaque disease.   Circumflex Artery: Moderate caliber vessel with a stent extending from the mid vessel into the first obtuse marginal branch. The stent has 30% restenosis in the mid portion  of the stent. This appears unchanged from his catheterization last year.   Right Coronary Artery: Moderate caliver vessel with mild plaque disease mid vessel.   Left Ventricular Angiogram: LVEF=25-30% with akinesis of the anterior wall, apex and inferoapex.   Impression: 1. Double vessel CAD with patent stents mid LAD and mid OM1.  2. Severe LV systolic dysfunction 3. Ischemic cardiomyopathy  Recommendations: Continue medical management.        Complications:  None. The patient tolerated the procedure well.

## 2012-07-20 NOTE — H&P (View-Only) (Signed)
Primary Cardiologist: Simona Huh, MD (new)   HPI: Patient presents for evaluation of dyspnea and dizziness.   - Last 2-D echo, November 2011: EF 35%, anteroseptal/apical AK   - Last Myoview, November 2011: Large anterior/anteroseptal scar; medium inferoapical scar; no definite ischemia; EF 35%.  Patient reports one month history of DOE with occasional associated left chest tightness, as well as mid scapular discomfort. He denies such symptoms at rest. He denies orthopnea, PND, or LE edema. He denies any ICD shocks. He suggests some similarity of symptoms to prior ischemic events, including MI 18 years ago and pre-PCI presentation, approximately 3 years ago.  Patient also has occasional dizziness, but no frank syncope. He presents with marked bradycardia in the 40 bpm range.   12-lead EKG today, reviewed by me, indicates SB 44 bpm; LAD; probable prior inferior/anteroseptal MI; nonspecific ST segment changes   No Known Allergies  Current Outpatient Prescriptions  Medication Sig Dispense Refill  . aspirin 81 MG tablet Take 81 mg by mouth daily.        . bisoprolol (ZEBETA) 10 MG tablet TAKE 1 TABLET EVERY DAY  30 tablet  6  . COUMADIN 5 MG tablet TAKE 1/2 TAB MON WED & FRI AND 1 TAB ALL OTHER DAYS OR AS DIRECTED BY CLINIC  45 tablet  3  . CRESTOR 20 MG tablet TAKE 1 TABLET BY MOUTH EVERY DAY  30 tablet  6  . finasteride (PROSCAR) 5 MG tablet Take 5 mg by mouth Daily.       [name] losartan (COZAAR) 50 MG tablet TAKE 1 TABLET BY MOUTH EVERY DAY  30 tablet  6  . LOVAZA 1 G capsule Take 1 capsule by mouth Twice daily.      [name] spironolactone (ALDACTONE) 25 MG tablet TAKE 1 TABLET BY MOUTH EVERY DAY  30 tablet  6    Past Medical History  Diagnosis Date  . CAD (coronary artery disease)     w/ ischemic cardiomyopathy  . Myocardial infarction     status post prior anterior wall with ventricular remodeling   . CHF NYHA class II   . Paroxysmal atrial fibrillation     on coumadin  . Stroke   .  Pseudoaneurysm   . A-V fistula   . Inducible ventricular tachycardia   . Ejection fraction < 50%     Ejection fraction 30% w severe anterior, anteroapical, inferoapical akinesis    Past Surgical History  Procedure Date  . Bare metal stent   . Cardiac catheterization   . Aicd implantation 2011    St Jude Current CD    History   Social History  . Marital Status: Married    Spouse Name: N/A    Number of Children: N/A  . Years of Education: N/A   Occupational History  . Not on file.   Social History Main Topics  . Smoking status: Never Smoker   . Smokeless tobacco: Never Used  . Alcohol Use: No  . Drug Use: No  . Sexually Active: Not on file   Other Topics Concern  . Not on file   Social History Narrative  . No narrative on file    No family history on file.  ROS: no nausea, vomiting; no fever, chills; no melena, hematochezia; no claudication  PHYSICAL EXAM: BP 104/66  Pulse 44  Ht 6' 1.5" (1.867 m)  Wt 228 lb 1.9 oz (103.475 kg)  BMI 29.69 kg/m2 GENERAL: 61 year-old male; NAD HEENT: NCAT, PERRLA, EOMI; sclera  clear; no xanthelasma NECK: palpable bilateral carotid pulses, no bruits; no JVD; no TM LUNGS: CTA bilaterally CARDIAC: RRR (S1, S2); no significant murmurs; no rubs or gallops ABDOMEN: Protuberant EXTREMETIES: Palpable femoral pulses, no bruits; intact distal pulses; no significant peripheral edema SKIN: warm/dry; no obvious rash/lesions MUSCULOSKELETAL: no joint deformity NEURO: no focal deficit; NL affect   EKG: reviewed and available in Electronic Records   ASSESSMENT & PLAN:  DYSPNEA ON EXERTION I'm concerned that this represents his anginal equivalent. Moreover, he does have some occasional associated chest and mid scapular tightness. Therefore, I recommend elective diagnostic cardiac catheterization, with which patient concurs. The risks/benefits were discussed, and plan was approved by Dr. Diona Browner.  ATRIAL FIBRILLATION Maintaining NSR.  Patient on chronic Coumadin, which will be held prior to catheterization. Of note, he will require bridging with Lovenox, given history of prior stroke.  ICD -St.Jude Followed by Dr. Johney Frame. Patient denies any recent ICD shocks.  Bradycardia Will reduce bisoprolol dose to 5 mg daily, to start in a.m. Scheduled dose to be held today.    Gene Paytin Ramakrishnan, PAC

## 2012-07-23 ENCOUNTER — Ambulatory Visit (INDEPENDENT_AMBULATORY_CARE_PROVIDER_SITE_OTHER): Payer: BC Managed Care – PPO | Admitting: Internal Medicine

## 2012-07-23 ENCOUNTER — Encounter: Payer: Self-pay | Admitting: Internal Medicine

## 2012-07-23 ENCOUNTER — Ambulatory Visit (INDEPENDENT_AMBULATORY_CARE_PROVIDER_SITE_OTHER): Payer: BC Managed Care – PPO | Admitting: *Deleted

## 2012-07-23 VITALS — BP 103/68 | HR 55 | Ht 73.0 in | Wt 231.4 lb

## 2012-07-23 DIAGNOSIS — R001 Bradycardia, unspecified: Secondary | ICD-10-CM

## 2012-07-23 DIAGNOSIS — I4891 Unspecified atrial fibrillation: Secondary | ICD-10-CM

## 2012-07-23 DIAGNOSIS — I2589 Other forms of chronic ischemic heart disease: Secondary | ICD-10-CM

## 2012-07-23 DIAGNOSIS — I6350 Cerebral infarction due to unspecified occlusion or stenosis of unspecified cerebral artery: Secondary | ICD-10-CM

## 2012-07-23 DIAGNOSIS — Z7901 Long term (current) use of anticoagulants: Secondary | ICD-10-CM

## 2012-07-23 DIAGNOSIS — I498 Other specified cardiac arrhythmias: Secondary | ICD-10-CM

## 2012-07-23 LAB — ICD DEVICE OBSERVATION
BRDY-0002RV: 40 {beats}/min
CHARGE TIME: 12.8 s
FVT: 0
HV IMPEDENCE: 47 Ohm
RV LEAD AMPLITUDE: 11.5 mv
RV LEAD IMPEDENCE ICD: 612.5 Ohm
RV LEAD THRESHOLD: 0.75 V
TOT-0007: 1
TOT-0010: 18
TZAT-0013FASTVT: 1
TZAT-0018FASTVT: NEGATIVE
TZAT-0020FASTVT: 1 ms
TZON-0003FASTVT: 285 ms
TZON-0004SLOWVT: 30
TZON-0005FASTVT: 6
TZON-0005SLOWVT: 6
TZON-0010FASTVT: 80 ms
TZON-0010SLOWVT: 80 ms
TZST-0001FASTVT: 2
TZST-0001FASTVT: 4
TZST-0003FASTVT: 36 J
TZST-0003FASTVT: 36 J
VF: 0

## 2012-07-23 LAB — POCT INR: INR: 1.2

## 2012-07-23 NOTE — Assessment & Plan Note (Signed)
He has chronic bradycardia.  ICD interrogation today reveals only 1.% V pacing His heart rate histograms reveal that most heart beats are 50s/60s but occasionally faster No changes I am not convinced at this point that he would receive clinical benefit from an atrial lead but will continue to follow this going forward  Narrow QRS suggests that he would not benefit from CRT

## 2012-07-23 NOTE — Progress Notes (Signed)
PCP: Selinda Flavin, MD Primary Cardiologist: Dr Diona Browner  The patient presents today for routine electrophysiology followup.  Since last being seen in our clinic, the patient reports doing very well.   He continues to have occasional fatigue but remains active.  He has occasional SOB and dizziness.  He underwent cath by Dr Clifton Antrone Walla which revealed stable CAD with no lesions requiring intervention.  Medical therapy was advsied.  Today, he denies symptoms of palpitations, chest pain, orthopnea, PND, lower extremity edema, presyncope, syncope, or neurologic sequela.  The patient feels that he is tolerating medications without difficulties and is otherwise without complaint today.   Past Medical History  Diagnosis Date  . CAD (coronary artery disease)     w/ ischemic cardiomyopathy  . Myocardial infarction     status post prior anterior wall with ventricular remodeling   . CHF NYHA class II   . Paroxysmal atrial fibrillation     on coumadin  . Stroke   . Pseudoaneurysm   . A-V fistula   . Inducible ventricular tachycardia   . Ejection fraction < 50%     Ejection fraction 30% w severe anterior, anteroapical, inferoapical akinesis   Past Surgical History  Procedure Date  . Bare metal stent   . Cardiac catheterization   . Aicd implantation 2011    St Jude Current CD    Current Outpatient Prescriptions  Medication Sig Dispense Refill  . aspirin 81 MG tablet Take 81 mg by mouth daily.        . bisoprolol (ZEBETA) 5 MG tablet Take 1 tablet (5 mg total) by mouth daily.  30 tablet  6  . COUMADIN 5 MG tablet TAKE 1/2 TAB MON WED & FRI AND 1 TAB ALL OTHER DAYS OR AS DIRECTED BY CLINIC  45 tablet  3  . CRESTOR 20 MG tablet TAKE 1 TABLET BY MOUTH EVERY DAY  30 tablet  6  . enoxaparin (LOVENOX) 150 MG/ML injection Inject 1 mL (150 mg total) into the skin daily.  10 Syringe  1  . finasteride (PROSCAR) 5 MG tablet Take 5 mg by mouth Daily.       [name] losartan (COZAAR) 50 MG tablet TAKE 1 TABLET BY  MOUTH EVERY DAY  30 tablet  6  . LOVAZA 1 G capsule Take 1 capsule by mouth Twice daily.      . nitroGLYCERIN (NITROSTAT) 0.4 MG SL tablet Place 1 tablet (0.4 mg total) under the tongue every 5 (five) minutes as needed for chest pain.  90 tablet  3  . spironolactone (ALDACTONE) 25 MG tablet TAKE 1 TABLET BY MOUTH EVERY DAY  30 tablet  6    No Known Allergies  History   Social History  . Marital Status: Married    Spouse Name: N/A    Number of Children: N/A  . Years of Education: N/A   Occupational History  . Not on file.   Social History Main Topics  . Smoking status: Never Smoker   . Smokeless tobacco: Never Used  . Alcohol Use: No  . Drug Use: No  . Sexually Active: Not on file   Other Topics Concern  . Not on file   Social History Narrative  . No narrative on file    Physical Exam: Filed Vitals:   07/23/12 1411  BP: 103/68  Pulse: 55  Height: 6\' 1"  (1.854 m)  Weight: 231 lb 6.4 oz (104.962 kg)    GEN- The patient is well appearing, alert and oriented  x 3 today.   Head- normocephalic, atraumatic Eyes-  Sclera clear, conjunctiva pink Ears- hearing intact Oropharynx- clear Neck- supple, no JVP Lymph- no cervical lymphadenopathy Lungs- Clear to ausculation bilaterally, normal work of breathing Chest- ICD pocket is well healed Heart- Regular rate and rhythm, no murmurs, rubs or gallops, PMI not laterally displaced GI- soft, NT, ND, + BS Extremities- no clubbing, cyanosis, or edema  ICD interrogation- reviewed in detail today,  See PACEART report EKG 07/11/12 reveals sinus bradycardia (40s) with QRS  Assessment and Plan:

## 2012-07-23 NOTE — Patient Instructions (Signed)
Continue all current medications. Merlin phone check - 10/29/2012 Dr. Johney Frame - 6 months

## 2012-07-23 NOTE — Assessment & Plan Note (Signed)
Normal ICD function See Pace Art report No changes today  

## 2012-07-26 ENCOUNTER — Ambulatory Visit (INDEPENDENT_AMBULATORY_CARE_PROVIDER_SITE_OTHER): Payer: BC Managed Care – PPO | Admitting: *Deleted

## 2012-07-26 DIAGNOSIS — I6350 Cerebral infarction due to unspecified occlusion or stenosis of unspecified cerebral artery: Secondary | ICD-10-CM

## 2012-07-26 DIAGNOSIS — I4891 Unspecified atrial fibrillation: Secondary | ICD-10-CM

## 2012-07-26 DIAGNOSIS — Z7901 Long term (current) use of anticoagulants: Secondary | ICD-10-CM

## 2012-07-26 LAB — POCT INR: INR: 3.7

## 2012-08-02 ENCOUNTER — Encounter: Payer: Self-pay | Admitting: Physician Assistant

## 2012-08-02 ENCOUNTER — Ambulatory Visit (INDEPENDENT_AMBULATORY_CARE_PROVIDER_SITE_OTHER): Payer: BC Managed Care – PPO | Admitting: Physician Assistant

## 2012-08-02 VITALS — BP 100/70 | HR 59 | Ht 73.0 in | Wt 227.1 lb

## 2012-08-02 DIAGNOSIS — Z95810 Presence of automatic (implantable) cardiac defibrillator: Secondary | ICD-10-CM

## 2012-08-02 DIAGNOSIS — I4891 Unspecified atrial fibrillation: Secondary | ICD-10-CM

## 2012-08-02 DIAGNOSIS — I2589 Other forms of chronic ischemic heart disease: Secondary | ICD-10-CM

## 2012-08-02 DIAGNOSIS — R001 Bradycardia, unspecified: Secondary | ICD-10-CM

## 2012-08-02 DIAGNOSIS — I959 Hypotension, unspecified: Secondary | ICD-10-CM | POA: Insufficient documentation

## 2012-08-02 DIAGNOSIS — I498 Other specified cardiac arrhythmias: Secondary | ICD-10-CM

## 2012-08-02 NOTE — Assessment & Plan Note (Signed)
Followed by Dr. Johney Frame, who recently noted that the patient would not benefit from CRT, given his narrow QRS.

## 2012-08-02 NOTE — Assessment & Plan Note (Signed)
Stable, but precludes further up titration of his cardiac medication regimen.

## 2012-08-02 NOTE — Assessment & Plan Note (Signed)
Resolved, following recent decrease in bisoprolol dose. Patient reports significant symptomatic improvement.

## 2012-08-02 NOTE — Assessment & Plan Note (Signed)
The results of the recent catheterization were reviewed with the patient. No further workup currently indicated. We'll plan on return visit in 3 months, at which time he will establish with Dr. Diona Browner.

## 2012-08-02 NOTE — Progress Notes (Signed)
Primary Cardiologist: Simona Huh, MD   HPI: Patient returns status post recent elective cardiac catheterization, for further evaluation of DOE.   - Cardiac catheterization, 11/1: 2v CAD with patent stents mid LAD and mid OM1; EF 25-30%, anterior apical/inferoapical AK  Clinically, patient feels much improved. He attributes this to the decreased dose of bisoprolol, which we did at time of last OV. This was following his presentation with a pulse of 44. He suggests increased exercise tolerance and less DOE, and continues to report no CP, tachycardia palpitations, or ICD discharge. He also denies symptoms suggestive of decompensated heart failure.   He denies any complications of R groin incision site.  No Known Allergies  Current Outpatient Prescriptions  Medication Sig Dispense Refill  . aspirin 81 MG tablet Take 81 mg by mouth daily.        . bisoprolol (ZEBETA) 5 MG tablet Take 1 tablet (5 mg total) by mouth daily.  30 tablet  6  . COUMADIN 5 MG tablet TAKE 1/2 TAB MON WED & FRI AND 1 TAB ALL OTHER DAYS OR AS DIRECTED BY CLINIC  45 tablet  3  . CRESTOR 20 MG tablet TAKE 1 TABLET BY MOUTH EVERY DAY  30 tablet  6  . finasteride (PROSCAR) 5 MG tablet Take 5 mg by mouth Daily.       [name] losartan (COZAAR) 50 MG tablet TAKE 1 TABLET BY MOUTH EVERY DAY  30 tablet  6  . LOVAZA 1 G capsule Take 1 capsule by mouth Twice daily.      . nitroGLYCERIN (NITROSTAT) 0.4 MG SL tablet Place 1 tablet (0.4 mg total) under the tongue every 5 (five) minutes as needed for chest pain.  90 tablet  3  . spironolactone (ALDACTONE) 25 MG tablet TAKE 1 TABLET BY MOUTH EVERY DAY  30 tablet  6    Past Medical History  Diagnosis Date  . CAD (coronary artery disease)     w/ ischemic cardiomyopathy  . Myocardial infarction     status post prior anterior wall with ventricular remodeling   . CHF NYHA class II   . Paroxysmal atrial fibrillation     on coumadin  . Stroke   . Pseudoaneurysm   . A-V fistula   .  Inducible ventricular tachycardia   . Ejection fraction < 50%     Ejection fraction 30% w severe anterior, anteroapical, inferoapical akinesis    Past Surgical History  Procedure Date  . Bare metal stent   . Cardiac catheterization   . Aicd implantation 2011    St Jude Current CD    History   Social History  . Marital Status: Married    Spouse Name: N/A    Number of Children: N/A  . Years of Education: N/A   Occupational History  . Not on file.   Social History Main Topics  . Smoking status: Never Smoker   . Smokeless tobacco: Never Used  . Alcohol Use: No  . Drug Use: No  . Sexually Active: Not on file   Other Topics Concern  . Not on file   Social History Narrative  . No narrative on file    No family history on file.  ROS: no nausea, vomiting; no fever, chills; no melena, hematochezia; no claudication  PHYSICAL EXAM: BP 100/70  Pulse 59  Ht 6\' 1"  (1.854 m)  Wt 227 lb 1.9 oz (103.021 kg)  BMI 29.96 kg/m2  SpO2 96% GENERAL: 61 year-old male; NAD  HEENT:  NCAT, PERRLA, EOMI; sclera clear; no xanthelasma  NECK: palpable bilateral carotid pulses, no bruits; no JVD; no TM  LUNGS: CTA bilaterally  CARDIAC: RRR (S1, S2); no significant murmurs; no rubs or gallops  ABDOMEN: Protuberant  EXTREMETIES: Palpable femoral pulses, no bruits; intact distal pulses; no significant peripheral edema  SKIN: warm/dry; no obvious rash/lesions  MUSCULOSKELETAL: no joint deformity  NEURO: no focal deficit; NL affect   EKG:    ASSESSMENT & PLAN:  CARDIOMYOPATHY, ISCHEMIC The results of the recent catheterization were reviewed with the patient. No further workup currently indicated. We'll plan on return visit in 3 months, at which time he will establish with Dr. Diona Browner.  ATRIAL FIBRILLATION Maintaining NSR by history, exam, and recent EKG. Patient on chronic Coumadin, followed here in our clinic.  Bradycardia Resolved, following recent decrease in bisoprolol dose.  Patient reports significant symptomatic improvement.  ICD -St.Jude Followed by Dr. Johney Frame, who recently noted that the patient would not benefit from CRT, given his narrow QRS.     Hypotension Stable, but precludes further up titration of his cardiac medication regimen.    Gene Simrat Kendrick, PAC

## 2012-08-02 NOTE — Assessment & Plan Note (Signed)
Maintaining NSR by history, exam, and recent EKG. Patient on chronic Coumadin, followed here in our clinic.

## 2012-08-02 NOTE — Patient Instructions (Signed)
Continue all current medications. Follow up in  3 months 

## 2012-08-15 ENCOUNTER — Ambulatory Visit (INDEPENDENT_AMBULATORY_CARE_PROVIDER_SITE_OTHER): Payer: BC Managed Care – PPO | Admitting: *Deleted

## 2012-08-15 DIAGNOSIS — Z7901 Long term (current) use of anticoagulants: Secondary | ICD-10-CM

## 2012-08-15 DIAGNOSIS — I4891 Unspecified atrial fibrillation: Secondary | ICD-10-CM

## 2012-08-15 DIAGNOSIS — I6350 Cerebral infarction due to unspecified occlusion or stenosis of unspecified cerebral artery: Secondary | ICD-10-CM

## 2012-08-15 LAB — POCT INR: INR: 2.4

## 2012-08-20 ENCOUNTER — Encounter: Payer: Self-pay | Admitting: Internal Medicine

## 2012-08-27 ENCOUNTER — Telehealth: Payer: Self-pay | Admitting: *Deleted

## 2012-08-27 NOTE — Telephone Encounter (Signed)
Received Merlin transmission.  Pt has had 2 episodes in the VT monitor zone that appear to be sinus tach.  Last episode 12-1 at 12:40.  Pt does not remember any specific activity at that time.  He has had a cold recently.  Pt denies any symptoms of dizziness, palpitations or pre-syncope.  Per Dr Johney Frame, programming changes to made to include turning of VT monitor zone. Patient aware this is to help preserve battery.  Pt to be seen next Wednesday, December 18th at 12:00 with Belenda Cruise in the [place] office.  Pt aware and agrees with plan.

## 2012-09-05 ENCOUNTER — Encounter: Payer: Self-pay | Admitting: Internal Medicine

## 2012-09-05 ENCOUNTER — Ambulatory Visit (INDEPENDENT_AMBULATORY_CARE_PROVIDER_SITE_OTHER): Payer: BC Managed Care – PPO | Admitting: Internal Medicine

## 2012-09-05 VITALS — BP 108/69 | HR 46 | Ht 73.0 in | Wt 227.0 lb

## 2012-09-05 DIAGNOSIS — I428 Other cardiomyopathies: Secondary | ICD-10-CM

## 2012-09-05 LAB — ICD DEVICE OBSERVATION: DEV-0020ICD: NEGATIVE

## 2012-09-05 NOTE — Progress Notes (Signed)
Turn off monitor zone per Dr Allred/kwm

## 2012-09-17 ENCOUNTER — Ambulatory Visit (INDEPENDENT_AMBULATORY_CARE_PROVIDER_SITE_OTHER): Payer: BC Managed Care – PPO | Admitting: *Deleted

## 2012-09-17 DIAGNOSIS — I4891 Unspecified atrial fibrillation: Secondary | ICD-10-CM

## 2012-09-17 DIAGNOSIS — Z7901 Long term (current) use of anticoagulants: Secondary | ICD-10-CM

## 2012-09-17 DIAGNOSIS — I6350 Cerebral infarction due to unspecified occlusion or stenosis of unspecified cerebral artery: Secondary | ICD-10-CM

## 2012-09-17 LAB — POCT INR: INR: 2.3

## 2012-10-03 ENCOUNTER — Other Ambulatory Visit: Payer: Self-pay | Admitting: Cardiology

## 2012-10-13 ENCOUNTER — Other Ambulatory Visit: Payer: Self-pay | Admitting: Cardiology

## 2012-10-22 ENCOUNTER — Telehealth: Payer: Self-pay | Admitting: *Deleted

## 2012-10-22 NOTE — Telephone Encounter (Signed)
No showed appointment.  Message left on machine for patient to call office to reschedule. Letter also mailed. / tgs °

## 2012-10-24 ENCOUNTER — Ambulatory Visit (INDEPENDENT_AMBULATORY_CARE_PROVIDER_SITE_OTHER): Payer: BC Managed Care – PPO | Admitting: *Deleted

## 2012-10-24 DIAGNOSIS — I4891 Unspecified atrial fibrillation: Secondary | ICD-10-CM

## 2012-10-24 DIAGNOSIS — I6350 Cerebral infarction due to unspecified occlusion or stenosis of unspecified cerebral artery: Secondary | ICD-10-CM

## 2012-10-24 DIAGNOSIS — Z7901 Long term (current) use of anticoagulants: Secondary | ICD-10-CM

## 2012-10-24 LAB — POCT INR: INR: 2.1

## 2012-10-29 ENCOUNTER — Ambulatory Visit (INDEPENDENT_AMBULATORY_CARE_PROVIDER_SITE_OTHER): Payer: BC Managed Care – PPO | Admitting: *Deleted

## 2012-10-29 ENCOUNTER — Encounter: Payer: Self-pay | Admitting: Internal Medicine

## 2012-10-29 ENCOUNTER — Other Ambulatory Visit: Payer: Self-pay | Admitting: Internal Medicine

## 2012-10-29 DIAGNOSIS — Z95810 Presence of automatic (implantable) cardiac defibrillator: Secondary | ICD-10-CM

## 2012-10-29 DIAGNOSIS — I2589 Other forms of chronic ischemic heart disease: Secondary | ICD-10-CM

## 2012-11-02 LAB — REMOTE ICD DEVICE
BATTERY VOLTAGE: 2.74 v
BRDY-0002RV: 40 {beats}/min
DEV-0020ICD: NEGATIVE
DEVICE MODEL ICD: 557946
HV IMPEDENCE: 55 Ohm
RV LEAD AMPLITUDE: 11.7 mv
RV LEAD IMPEDENCE ICD: 740 Ohm
TZAT-0001SLOWVT: 1
TZAT-0004SLOWVT: 8
TZAT-0012SLOWVT: 200 ms
TZAT-0013SLOWVT: 1
TZAT-0018SLOWVT: NEGATIVE
TZAT-0019SLOWVT: 7.5 v
TZAT-0020SLOWVT: 1 ms
TZON-0003SLOWVT: 300 ms
TZON-0004SLOWVT: 40
TZON-0005SLOWVT: 6
TZON-0010SLOWVT: 80 ms
TZST-0001SLOWVT: 2
TZST-0001SLOWVT: 3
TZST-0001SLOWVT: 4
TZST-0001SLOWVT: 5
TZST-0003SLOWVT: 30 J
TZST-0003SLOWVT: 36 J
TZST-0003SLOWVT: 36 J
TZST-0003SLOWVT: 36 J
VENTRICULAR PACING ICD: 1.2 pct

## 2012-11-07 ENCOUNTER — Encounter: Payer: Self-pay | Admitting: Cardiology

## 2012-11-07 ENCOUNTER — Ambulatory Visit (INDEPENDENT_AMBULATORY_CARE_PROVIDER_SITE_OTHER): Payer: BC Managed Care – PPO | Admitting: Cardiology

## 2012-11-07 VITALS — BP 108/70 | HR 63 | Ht 73.0 in | Wt 225.0 lb

## 2012-11-07 DIAGNOSIS — E785 Hyperlipidemia, unspecified: Secondary | ICD-10-CM

## 2012-11-07 DIAGNOSIS — I2589 Other forms of chronic ischemic heart disease: Secondary | ICD-10-CM

## 2012-11-07 DIAGNOSIS — Z95810 Presence of automatic (implantable) cardiac defibrillator: Secondary | ICD-10-CM

## 2012-11-07 DIAGNOSIS — I4891 Unspecified atrial fibrillation: Secondary | ICD-10-CM

## 2012-11-07 DIAGNOSIS — I5022 Chronic systolic (congestive) heart failure: Secondary | ICD-10-CM

## 2012-11-07 DIAGNOSIS — I2510 Atherosclerotic heart disease of native coronary artery without angina pectoris: Secondary | ICD-10-CM

## 2012-11-07 NOTE — Progress Notes (Signed)
Clinical Summary [name] is a 62 y.o.male presenting for followup. He is a former patient of Dr. Andee Lineman, last seen by [name] in November 2013. He is also followed by Dr. Johney Frame. I reviewed his history, including recent cardiac catheterization from last year. He continues to work in [employer], admits that he has not been exercising as regularly. We discussed this. He reports no angina symptoms or unusual shortness of breath.  Lab work from October 2013 showed BUN 17, creatinine 1.1, potassium 4.2, hemoglobin 14.8, platelets 140. He just recently had a well physical with Dr. Dimas Aguas, reports good lipid control, states that his glucose was mildly increased. ECG today shows sinus rhythm with evidence of old anterior wall infarct, low voltage, nonspecific ST-T changes.  He denies any palpitations or device discharges.  No Known Allergies  Current Outpatient Prescriptions  Medication Sig Dispense Refill  . aspirin 81 MG tablet Take 81 mg by mouth daily.        . bisoprolol (ZEBETA) 5 MG tablet Take 1 tablet (5 mg total) by mouth daily.  30 tablet  6  . COUMADIN 5 MG tablet TAKE 1/2 TAB MON WED & FRI AND 1 TAB ALL OTHER DAYS OR AS DIRECTED BY CLINIC  45 tablet  3  . CRESTOR 20 MG tablet TAKE 1 TABLET BY MOUTH EVERY DAY  30 tablet  3  . finasteride (PROSCAR) 5 MG tablet Take 5 mg by mouth Daily.       [name] losartan (COZAAR) 50 MG tablet TAKE 1 TABLET BY MOUTH EVERY DAY  30 tablet  6  . LOVAZA 1 G capsule Take 1 capsule by mouth Twice daily.      . nitroGLYCERIN (NITROSTAT) 0.4 MG SL tablet Place 1 tablet (0.4 mg total) under the tongue every 5 (five) minutes as needed for chest pain.  90 tablet  3  . spironolactone (ALDACTONE) 25 MG tablet TAKE 1 TABLET BY MOUTH EVERY DAY  30 tablet  6   No current facility-administered medications for this visit.    Past Medical History  Diagnosis Date  . Coronary atherosclerosis of native coronary artery     BMS to LAD an OM1 - patent at catheterization  11/13   . Anterior myocardial infarction   . Chronic systolic heart failure   . Paroxysmal atrial fibrillation     On coumadin  . Stroke   . Pseudoaneurysm   . A-V fistula   . Inducible ventricular tachycardia   . Ischemic cardiomyopathy     LVEF 30%, status post ICD    Past Surgical History  Procedure Laterality Date  . Aicd implantation  2011    St Jude Current ICD    Social History [name] reports that he has never smoked. He has never used smokeless tobacco. [name] reports that he does not drink alcohol.  Review of Systems Negative except as outlined.  Physical Examination Filed Vitals:   11/07/12 1406  BP: 108/70  Pulse: 63   Filed Weights   11/07/12 1406  Weight: 225 lb (102.059 kg)   No acute distress. HEENT: Conjunctiva and lids normal, oropharynx clear. Neck: Supple, no elevated JVP or carotid bruits, no thyromegaly. Lungs: Clear to auscultation, nonlabored breathing at rest. Thorax: Stable device pocket site. Cardiac: Regular rate and rhythm, no S3 or significant systolic murmur, no pericardial rub. Abdomen: Soft, nontender, bowel sounds present, no guarding or rebound. Extremities: No pitting edema, distal pulses 2+. Skin: Warm and dry. Musculoskeletal: No kyphosis. Neuropsychiatric:  Alert and oriented x3, affect grossly appropriate.   Problem List and Plan   Coronary atherosclerosis of native coronary artery No active angina symptoms, had patent stent sites in the LAD and OM1 as of November 2013. Continue medical therapy and observation. I did recommend a regular exercise regimen.  Chronic systolic heart failure Symptomatically stable, well controlled on good medical therapy.  CARDIOMYOPATHY, ISCHEMIC LVEF 30% status post ICD.  ATRIAL FIBRILLATION Paroxysmal, no palpitations, in sinus rhythm today. He continues on Coumadin. Recent INR therapeutic.  ICD -St.Jude Followed by Dr. Johney Frame. No device discharges.  DYSLIPIDEMIA On statin  therapy, followed by Dr. Dimas Aguas.    Jonelle Sidle, M.D., F.A.C.C.

## 2012-11-07 NOTE — Assessment & Plan Note (Signed)
Followed by Dr. Johney Frame. No device discharges.

## 2012-11-07 NOTE — Patient Instructions (Addendum)

## 2012-11-07 NOTE — Assessment & Plan Note (Signed)
Symptomatically stable, well controlled on good medical therapy.

## 2012-11-07 NOTE — Assessment & Plan Note (Signed)
On statin therapy, followed by Dr. Dimas Aguas.

## 2012-11-07 NOTE — Assessment & Plan Note (Signed)
No active angina symptoms, had patent stent sites in the LAD and OM1 as of November 2013. Continue medical therapy and observation. I did recommend a regular exercise regimen.

## 2012-11-07 NOTE — Assessment & Plan Note (Signed)
LVEF 30% status post ICD.

## 2012-11-07 NOTE — Assessment & Plan Note (Signed)
Paroxysmal, no palpitations, in sinus rhythm today. He continues on Coumadin. Recent INR therapeutic.

## 2012-11-26 ENCOUNTER — Encounter: Payer: Self-pay | Admitting: *Deleted

## 2012-12-03 ENCOUNTER — Encounter: Payer: Self-pay | Admitting: Cardiology

## 2012-12-03 ENCOUNTER — Other Ambulatory Visit: Payer: Self-pay | Admitting: Cardiology

## 2012-12-03 MED ORDER — COUMADIN 5 MG PO TABS: ORAL_TABLET | ORAL | Status: DC

## 2012-12-05 ENCOUNTER — Ambulatory Visit (INDEPENDENT_AMBULATORY_CARE_PROVIDER_SITE_OTHER): Payer: BC Managed Care – PPO | Admitting: *Deleted

## 2012-12-05 DIAGNOSIS — I6350 Cerebral infarction due to unspecified occlusion or stenosis of unspecified cerebral artery: Secondary | ICD-10-CM

## 2012-12-05 DIAGNOSIS — Z7901 Long term (current) use of anticoagulants: Secondary | ICD-10-CM

## 2012-12-05 DIAGNOSIS — I4891 Unspecified atrial fibrillation: Secondary | ICD-10-CM

## 2012-12-05 LAB — POCT INR: INR: 2.3

## 2013-01-16 ENCOUNTER — Ambulatory Visit (INDEPENDENT_AMBULATORY_CARE_PROVIDER_SITE_OTHER): Payer: BC Managed Care – PPO | Admitting: *Deleted

## 2013-01-16 DIAGNOSIS — I6350 Cerebral infarction due to unspecified occlusion or stenosis of unspecified cerebral artery: Secondary | ICD-10-CM

## 2013-01-16 DIAGNOSIS — I4891 Unspecified atrial fibrillation: Secondary | ICD-10-CM

## 2013-01-16 DIAGNOSIS — Z7901 Long term (current) use of anticoagulants: Secondary | ICD-10-CM

## 2013-01-16 LAB — POCT INR: INR: 2.1

## 2013-01-28 ENCOUNTER — Encounter: Payer: Self-pay | Admitting: Internal Medicine

## 2013-01-28 ENCOUNTER — Ambulatory Visit (INDEPENDENT_AMBULATORY_CARE_PROVIDER_SITE_OTHER): Payer: BC Managed Care – PPO | Admitting: *Deleted

## 2013-01-28 DIAGNOSIS — I2589 Other forms of chronic ischemic heart disease: Secondary | ICD-10-CM

## 2013-01-28 DIAGNOSIS — Z95810 Presence of automatic (implantable) cardiac defibrillator: Secondary | ICD-10-CM

## 2013-01-29 ENCOUNTER — Ambulatory Visit (INDEPENDENT_AMBULATORY_CARE_PROVIDER_SITE_OTHER): Payer: BC Managed Care – PPO | Admitting: Urology

## 2013-01-29 DIAGNOSIS — N400 Enlarged prostate without lower urinary tract symptoms: Secondary | ICD-10-CM

## 2013-01-30 LAB — REMOTE ICD DEVICE
BATTERY VOLTAGE: 2.65 V
BRDY-0002RV: 40 {beats}/min
DEVICE MODEL ICD: 557946
RV LEAD AMPLITUDE: 9.5 mv
TZAT-0001SLOWVT: 1
TZAT-0013SLOWVT: 1
TZAT-0020SLOWVT: 1 ms
TZON-0005SLOWVT: 6
TZST-0001SLOWVT: 2
TZST-0001SLOWVT: 4
TZST-0003SLOWVT: 36 J
VENTRICULAR PACING ICD: 1 pct

## 2013-02-02 ENCOUNTER — Other Ambulatory Visit: Payer: Self-pay | Admitting: Physician Assistant

## 2013-02-04 ENCOUNTER — Other Ambulatory Visit: Payer: Self-pay | Admitting: Physician Assistant

## 2013-02-06 ENCOUNTER — Encounter: Payer: Self-pay | Admitting: *Deleted

## 2013-02-27 ENCOUNTER — Ambulatory Visit (INDEPENDENT_AMBULATORY_CARE_PROVIDER_SITE_OTHER): Payer: BC Managed Care – PPO | Admitting: *Deleted

## 2013-02-27 DIAGNOSIS — I6350 Cerebral infarction due to unspecified occlusion or stenosis of unspecified cerebral artery: Secondary | ICD-10-CM

## 2013-02-27 DIAGNOSIS — I4891 Unspecified atrial fibrillation: Secondary | ICD-10-CM

## 2013-02-27 DIAGNOSIS — Z7901 Long term (current) use of anticoagulants: Secondary | ICD-10-CM

## 2013-02-27 LAB — POCT INR: INR: 2.6

## 2013-04-24 ENCOUNTER — Ambulatory Visit (INDEPENDENT_AMBULATORY_CARE_PROVIDER_SITE_OTHER): Payer: BC Managed Care – PPO | Admitting: *Deleted

## 2013-04-24 DIAGNOSIS — I4891 Unspecified atrial fibrillation: Secondary | ICD-10-CM

## 2013-04-24 DIAGNOSIS — Z7901 Long term (current) use of anticoagulants: Secondary | ICD-10-CM

## 2013-04-24 DIAGNOSIS — I6350 Cerebral infarction due to unspecified occlusion or stenosis of unspecified cerebral artery: Secondary | ICD-10-CM

## 2013-04-24 LAB — POCT INR: INR: 2

## 2013-04-26 ENCOUNTER — Other Ambulatory Visit: Payer: Self-pay | Admitting: Physician Assistant

## 2013-04-29 ENCOUNTER — Encounter: Payer: BC Managed Care – PPO | Admitting: *Deleted

## 2013-05-01 ENCOUNTER — Encounter: Payer: Self-pay | Admitting: *Deleted

## 2013-05-06 ENCOUNTER — Encounter: Payer: Self-pay | Admitting: Internal Medicine

## 2013-05-06 ENCOUNTER — Ambulatory Visit (INDEPENDENT_AMBULATORY_CARE_PROVIDER_SITE_OTHER): Payer: BC Managed Care – PPO | Admitting: *Deleted

## 2013-05-06 DIAGNOSIS — I2589 Other forms of chronic ischemic heart disease: Secondary | ICD-10-CM

## 2013-05-06 DIAGNOSIS — Z95810 Presence of automatic (implantable) cardiac defibrillator: Secondary | ICD-10-CM

## 2013-05-07 LAB — REMOTE ICD DEVICE
BATTERY VOLTAGE: 2.62 V
DEV-0020ICD: NEGATIVE
HV IMPEDENCE: 50 Ohm
RV LEAD AMPLITUDE: 11.6 mv
TZAT-0001SLOWVT: 1
TZAT-0018SLOWVT: NEGATIVE
TZAT-0019SLOWVT: 7.5 V
TZAT-0020SLOWVT: 1 ms
TZON-0004SLOWVT: 40
TZON-0005SLOWVT: 6
TZON-0010SLOWVT: 80 ms
TZST-0001SLOWVT: 4
TZST-0003SLOWVT: 30 J
TZST-0003SLOWVT: 36 J
TZST-0003SLOWVT: 36 J
VENTRICULAR PACING ICD: 1 pct

## 2013-05-09 ENCOUNTER — Encounter: Payer: Self-pay | Admitting: Cardiology

## 2013-05-09 ENCOUNTER — Ambulatory Visit (INDEPENDENT_AMBULATORY_CARE_PROVIDER_SITE_OTHER): Payer: BC Managed Care – PPO | Admitting: Cardiology

## 2013-05-09 VITALS — BP 92/65 | HR 66 | Ht 73.0 in | Wt 222.0 lb

## 2013-05-09 DIAGNOSIS — I2589 Other forms of chronic ischemic heart disease: Secondary | ICD-10-CM

## 2013-05-09 DIAGNOSIS — E785 Hyperlipidemia, unspecified: Secondary | ICD-10-CM

## 2013-05-09 DIAGNOSIS — I2510 Atherosclerotic heart disease of native coronary artery without angina pectoris: Secondary | ICD-10-CM

## 2013-05-09 DIAGNOSIS — I4891 Unspecified atrial fibrillation: Secondary | ICD-10-CM

## 2013-05-09 NOTE — Patient Instructions (Addendum)

## 2013-05-09 NOTE — Assessment & Plan Note (Signed)
Symptomatically stable on medical therapy. He had.patent stent sites in the LAD and OM1 as of November 2013. Continue observation.

## 2013-05-09 NOTE — Assessment & Plan Note (Signed)
Keep followup with Dr. Howard. 

## 2013-05-09 NOTE — Progress Notes (Signed)
Clinical Summary [name] is a 62 y.o.male last seen in February. Remote device followup noted just recently, no mode switch episodes and no ventricular arrhythmias. He has been doing well, no angina symptoms, NYHA class I dyspnea. He reports no device discharges or syncope. Describes occasional feeling of "fluttering" but nothing prolonged.  He reports compliance with his medications. Continues with regular physicals with [name]. He is having trouble with nocturia due to prostatism, some difficulty going back to sleep at nighttime. I asked him to review this further with [name].  He remains active in the [employer]. Work has been going well.   No Known Allergies  Current Outpatient Prescriptions  Medication Sig Dispense Refill  . aspirin 81 MG tablet Take 81 mg by mouth daily.        . bisoprolol (ZEBETA) 5 MG tablet Take 5 mg by mouth daily.      [name] COUMADIN 5 MG tablet Take 1 tablet on Sunday, Tuesday, Thursday and Saturday and take 1/2 tablet all other days.  45 tablet  3  . CRESTOR 20 MG tablet TAKE 1 TABLET BY MOUTH EVERY DAY  30 tablet  6  . finasteride (PROSCAR) 5 MG tablet Take 5 mg by mouth Daily.       [name] losartan (COZAAR) 50 MG tablet TAKE 1 TABLET BY MOUTH EVERY DAY  30 tablet  6  . LOVAZA 1 G capsule Take 1 capsule by mouth Twice daily.      . nitroGLYCERIN (NITROSTAT) 0.4 MG SL tablet Place 1 tablet (0.4 mg total) under the tongue every 5 (five) minutes as needed for chest pain.  90 tablet  3  . spironolactone (ALDACTONE) 25 MG tablet TAKE 1 TABLET BY MOUTH EVERY DAY  30 tablet  6   No current facility-administered medications for this visit.    Past Medical History  Diagnosis Date  . Coronary atherosclerosis of native coronary artery     BMS to LAD an OM1 - patent at catheterization 11/13   . Anterior myocardial infarction   . Chronic systolic heart failure   . Paroxysmal atrial fibrillation     On coumadin  . Stroke   . Pseudoaneurysm   . A-V  fistula   . Inducible ventricular tachycardia   . Ischemic cardiomyopathy     LVEF 30%, status post ICD    Past Surgical History  Procedure Laterality Date  . Aicd implantation  2011    St Jude Current ICD    Social History Mr. Huesman reports that he has never smoked. He has never used smokeless tobacco. Mr. Stehlik reports that he does not drink alcohol.  Review of Systems Negative except as outlined.  Physical Examination Filed Vitals:   05/09/13 0822  BP: 92/65  Pulse: 66   Filed Weights   05/09/13 0822  Weight: 222 lb (100.699 kg)   No acute distress.  HEENT: Conjunctiva and lids normal, oropharynx clear.  Neck: Supple, no elevated JVP or carotid bruits, no thyromegaly.  Lungs: Clear to auscultation, nonlabored breathing at rest.  Thorax: Stable device pocket site.  Cardiac: Regular rate and rhythm, no S3 or significant systolic murmur, no pericardial rub.  Abdomen: Soft, nontender, bowel sounds present, no guarding or rebound.  Extremities: No pitting edema, distal pulses 2+.    Problem List and Plan   Coronary atherosclerosis of native coronary artery Symptomatically stable on medical therapy. He had.patent stent sites in the LAD and OM1 as of November 2013. Continue observation.  CARDIOMYOPATHY, ISCHEMIC Symptomatically stable on medical therapy. Recent St. Jude ICD interrogation without ventricular arrhythmias. Keep follow up with Dr. Johney Frame for device interrogation. No change to cardiac regimen.  Atrial fibrillation History of PAF, no mode switches on recent device interrogation. Continues on Coumadin.  DYSLIPIDEMIA Keep followup with [name].    Jonelle Sidle, M.D., F.A.C.C.

## 2013-05-09 NOTE — Assessment & Plan Note (Signed)
History of PAF, no mode switches on recent device interrogation. Continues on Coumadin.

## 2013-05-09 NOTE — Assessment & Plan Note (Signed)
Symptomatically stable on medical therapy. Recent St. Jude ICD interrogation without ventricular arrhythmias. Keep follow up with Dr. Johney Frame for device interrogation. No change to cardiac regimen.

## 2013-05-21 ENCOUNTER — Encounter: Payer: Self-pay | Admitting: *Deleted

## 2013-06-05 ENCOUNTER — Ambulatory Visit (INDEPENDENT_AMBULATORY_CARE_PROVIDER_SITE_OTHER): Payer: BC Managed Care – PPO | Admitting: *Deleted

## 2013-06-05 DIAGNOSIS — I6350 Cerebral infarction due to unspecified occlusion or stenosis of unspecified cerebral artery: Secondary | ICD-10-CM

## 2013-06-05 DIAGNOSIS — I4891 Unspecified atrial fibrillation: Secondary | ICD-10-CM

## 2013-06-05 DIAGNOSIS — Z7901 Long term (current) use of anticoagulants: Secondary | ICD-10-CM

## 2013-06-05 LAB — POCT INR: INR: 3

## 2013-07-17 ENCOUNTER — Ambulatory Visit (INDEPENDENT_AMBULATORY_CARE_PROVIDER_SITE_OTHER): Payer: BC Managed Care – PPO | Admitting: *Deleted

## 2013-07-17 DIAGNOSIS — I4891 Unspecified atrial fibrillation: Secondary | ICD-10-CM

## 2013-07-17 DIAGNOSIS — Z7901 Long term (current) use of anticoagulants: Secondary | ICD-10-CM

## 2013-07-17 DIAGNOSIS — I6350 Cerebral infarction due to unspecified occlusion or stenosis of unspecified cerebral artery: Secondary | ICD-10-CM

## 2013-07-17 LAB — POCT INR: INR: 2.3

## 2013-07-22 ENCOUNTER — Other Ambulatory Visit: Payer: Self-pay

## 2013-07-22 MED ORDER — COUMADIN 5 MG PO TABS: ORAL_TABLET | ORAL | Status: DC

## 2013-07-25 ENCOUNTER — Other Ambulatory Visit: Payer: Self-pay

## 2013-07-28 ENCOUNTER — Other Ambulatory Visit: Payer: Self-pay | Admitting: Physician Assistant

## 2013-07-31 ENCOUNTER — Encounter: Payer: Self-pay | Admitting: Internal Medicine

## 2013-07-31 ENCOUNTER — Ambulatory Visit (INDEPENDENT_AMBULATORY_CARE_PROVIDER_SITE_OTHER): Payer: BC Managed Care – PPO | Admitting: Internal Medicine

## 2013-07-31 VITALS — BP 114/71 | HR 54 | Ht 73.0 in | Wt 226.0 lb

## 2013-07-31 DIAGNOSIS — I5022 Chronic systolic (congestive) heart failure: Secondary | ICD-10-CM

## 2013-07-31 DIAGNOSIS — I2589 Other forms of chronic ischemic heart disease: Secondary | ICD-10-CM

## 2013-07-31 DIAGNOSIS — I4891 Unspecified atrial fibrillation: Secondary | ICD-10-CM

## 2013-07-31 DIAGNOSIS — Z7901 Long term (current) use of anticoagulants: Secondary | ICD-10-CM

## 2013-07-31 LAB — MDC_IDC_ENUM_SESS_TYPE_INCLINIC
Battery Remaining Longevity: 52.8 mo
Battery Voltage: 2.59 V
Brady Statistic RV Percent Paced: 0.75 %
HighPow Impedance: 47.72
Lead Channel Impedance Value: 700 Ohm
Lead Channel Pacing Threshold Pulse Width: 0.5 ms
Lead Channel Setting Pacing Pulse Width: 0.5 ms
Lead Channel Setting Sensing Sensitivity: 0.3 mV
Zone Setting Detection Interval: 250 ms

## 2013-07-31 NOTE — Patient Instructions (Signed)
Continue all current medications. Your physician wants you to follow up in:  1 year.  You will receive a reminder letter in the mail one-two months in advance.  If you don't receive a letter, please call our office to schedule the follow up appointment   

## 2013-07-31 NOTE — Progress Notes (Signed)
PCP: Selinda Flavin, MD Primary Cardiologist: Dr Diona Browner  The patient presents today for routine electrophysiology followup.  Since last being seen in our clinic, the patient reports doing very well.   He continues to have occasional fatigue but remains active.   Today, he denies symptoms of palpitations, chest pain, orthopnea, PND, lower extremity edema, presyncope, syncope, or neurologic sequela.  The patient feels that he is tolerating medications without difficulties and is otherwise without complaint today.   Past Medical History  Diagnosis Date  . Coronary atherosclerosis of native coronary artery     BMS to LAD an OM1 - patent at catheterization 11/13   . Anterior myocardial infarction   . Chronic systolic heart failure   . Paroxysmal atrial fibrillation     On coumadin  . Stroke   . Pseudoaneurysm   . A-V fistula   . Inducible ventricular tachycardia   . Ischemic cardiomyopathy     LVEF 30%, status post ICD   Past Surgical History  Procedure Laterality Date  . Aicd implantation  2011    St Jude Current ICD    Current Outpatient Prescriptions  Medication Sig Dispense Refill  . aspirin 81 MG tablet Take 81 mg by mouth daily.        . bisoprolol (ZEBETA) 5 MG tablet Take 5 mg by mouth daily.      [name] COUMADIN 5 MG tablet Take 1 tablet on Sunday, Tuesday, Thursday and Saturday and take 1/2 tablet all other days.  45 tablet  3  . CRESTOR 20 MG tablet TAKE 1 TABLET BY MOUTH EVERY DAY  30 tablet  6  . losartan (COZAAR) 50 MG tablet TAKE 1 TABLET BY MOUTH EVERY DAY  30 tablet  6  . LOVAZA 1 G capsule Take 1 capsule by mouth Twice daily.      . nitroGLYCERIN (NITROSTAT) 0.4 MG SL tablet Place 1 tablet (0.4 mg total) under the tongue every 5 (five) minutes as needed for chest pain.  90 tablet  3  . spironolactone (ALDACTONE) 25 MG tablet TAKE 1 TABLET BY MOUTH EVERY DAY  30 tablet  6  . finasteride (PROSCAR) 5 MG tablet Take 5 mg by mouth Daily.        No current  facility-administered medications for this visit.    No Known Allergies  History   Social History  . Marital Status: Married    Spouse Name: N/A    Number of Children: N/A  . Years of Education: N/A   Occupational History  . Not on file.   Social History Main Topics  . Smoking status: Never Smoker   . Smokeless tobacco: Never Used  . Alcohol Use: No  . Drug Use: No  . Sexual Activity: Not on file   Other Topics Concern  . Not on file   Social History Narrative  . No narrative on file    Physical Exam: Filed Vitals:   07/31/13 0916  BP: 114/71  Pulse: 54  Height: 6\' 1"  (1.854 m)  Weight: 226 lb (102.513 kg)    GEN- The patient is well appearing, alert and oriented x 3 today.   Head- normocephalic, atraumatic Eyes-  Sclera clear, conjunctiva pink Ears- hearing intact Oropharynx- clear Neck- supple, no JVP Lymph- no cervical lymphadenopathy Lungs- Clear to ausculation bilaterally, normal work of breathing Chest- ICD pocket is well healed Heart- Regular rate and rhythm, no murmurs, rubs or gallops, PMI not laterally displaced GI- soft, NT, ND, + BS Extremities- no  clubbing, cyanosis, or edema  ICD interrogation- reviewed in detail today,  See PACEART report EKG 2/14 is reviewed  Assessment and Plan:  1. CAD/ ischemic CM/ chronic systolic dysfunction Normal ICD function See Pace Art report No changes today Histogram is preserved with no V pacing  2. afib Continue coumadin  3. Overweight Lifestyle modification is encouraged  Merlin Return in 1 year

## 2013-08-19 ENCOUNTER — Telehealth: Payer: Self-pay | Admitting: Cardiology

## 2013-08-19 MED ORDER — BISOPROLOL FUMARATE 5 MG PO TABS: 5.0000 mg | ORAL_TABLET | Freq: Every day | ORAL | Status: DC

## 2013-08-19 NOTE — Telephone Encounter (Signed)
Received fax refill request  Rx # [id number] Medication:  Bisoprolol Fumarate 5 mg tab Qty 30 Sig:  Take one tablet by mouth every day / decreased dosage on 07/11/12 Physician:  mcDowell

## 2013-08-28 ENCOUNTER — Ambulatory Visit (INDEPENDENT_AMBULATORY_CARE_PROVIDER_SITE_OTHER): Payer: BC Managed Care – PPO | Admitting: *Deleted

## 2013-08-28 DIAGNOSIS — I6350 Cerebral infarction due to unspecified occlusion or stenosis of unspecified cerebral artery: Secondary | ICD-10-CM

## 2013-08-28 DIAGNOSIS — I4891 Unspecified atrial fibrillation: Secondary | ICD-10-CM

## 2013-08-28 DIAGNOSIS — Z7901 Long term (current) use of anticoagulants: Secondary | ICD-10-CM

## 2013-09-13 ENCOUNTER — Telehealth: Payer: Self-pay | Admitting: *Deleted

## 2013-09-13 MED ORDER — ROSUVASTATIN CALCIUM 20 MG PO TABS: 20.0000 mg | ORAL_TABLET | Freq: Every day | ORAL | Status: DC

## 2013-09-13 NOTE — Telephone Encounter (Signed)
[facility]ill crestor 20 mg #30

## 2013-09-13 NOTE — Telephone Encounter (Signed)
rx crestor  To pharmacy

## 2013-10-09 ENCOUNTER — Ambulatory Visit (INDEPENDENT_AMBULATORY_CARE_PROVIDER_SITE_OTHER): Payer: BC Managed Care – PPO | Admitting: *Deleted

## 2013-10-09 DIAGNOSIS — I6350 Cerebral infarction due to unspecified occlusion or stenosis of unspecified cerebral artery: Secondary | ICD-10-CM

## 2013-10-09 DIAGNOSIS — I4891 Unspecified atrial fibrillation: Secondary | ICD-10-CM

## 2013-10-09 DIAGNOSIS — Z7901 Long term (current) use of anticoagulants: Secondary | ICD-10-CM

## 2013-10-09 LAB — POCT INR: INR: 1.9

## 2013-10-30 ENCOUNTER — Encounter: Payer: Self-pay | Admitting: Internal Medicine

## 2013-10-31 ENCOUNTER — Encounter: Payer: Self-pay | Admitting: Internal Medicine

## 2013-10-31 ENCOUNTER — Ambulatory Visit (INDEPENDENT_AMBULATORY_CARE_PROVIDER_SITE_OTHER): Payer: BC Managed Care – PPO | Admitting: *Deleted

## 2013-10-31 DIAGNOSIS — I4891 Unspecified atrial fibrillation: Secondary | ICD-10-CM

## 2013-10-31 LAB — MDC_IDC_ENUM_SESS_TYPE_REMOTE
Battery Voltage: 2.57 V
Brady Statistic RV Percent Paced: 1 % — CL
HighPow Impedance: 48 Ohm
Implantable Pulse Generator Serial Number: 557946
Lead Channel Sensing Intrinsic Amplitude: 10.7 mV
Lead Channel Setting Pacing Amplitude: 2.5 V
Lead Channel Setting Pacing Pulse Width: 0.5 ms
Lead Channel Setting Sensing Sensitivity: 0.3 mV
MDC IDC MSMT LEADCHNL RV IMPEDANCE VALUE: 700 Ohm
MDC IDC SET ZONE DETECTION INTERVAL: 250 ms
MDC IDC SET ZONE DETECTION INTERVAL: 300 ms

## 2013-11-06 ENCOUNTER — Encounter: Payer: Self-pay | Admitting: *Deleted

## 2013-11-07 DIAGNOSIS — N429 Disorder of prostate, unspecified: Secondary | ICD-10-CM | POA: Insufficient documentation

## 2013-11-07 DIAGNOSIS — I6350 Cerebral infarction due to unspecified occlusion or stenosis of unspecified cerebral artery: Secondary | ICD-10-CM | POA: Insufficient documentation

## 2013-11-07 DIAGNOSIS — G467 Other lacunar syndromes: Secondary | ICD-10-CM | POA: Insufficient documentation

## 2013-11-07 DIAGNOSIS — E7800 Pure hypercholesterolemia, unspecified: Secondary | ICD-10-CM | POA: Insufficient documentation

## 2013-11-13 ENCOUNTER — Ambulatory Visit (INDEPENDENT_AMBULATORY_CARE_PROVIDER_SITE_OTHER): Payer: BC Managed Care – PPO | Admitting: *Deleted

## 2013-11-13 DIAGNOSIS — I6350 Cerebral infarction due to unspecified occlusion or stenosis of unspecified cerebral artery: Secondary | ICD-10-CM

## 2013-11-13 DIAGNOSIS — Z5181 Encounter for therapeutic drug level monitoring: Secondary | ICD-10-CM

## 2013-11-13 DIAGNOSIS — I4891 Unspecified atrial fibrillation: Secondary | ICD-10-CM

## 2013-11-13 DIAGNOSIS — Z7901 Long term (current) use of anticoagulants: Secondary | ICD-10-CM

## 2013-11-13 LAB — POCT INR: INR: 2.5

## 2013-11-18 ENCOUNTER — Encounter: Payer: Self-pay | Admitting: *Deleted

## 2013-12-11 ENCOUNTER — Ambulatory Visit (INDEPENDENT_AMBULATORY_CARE_PROVIDER_SITE_OTHER): Payer: BC Managed Care – PPO | Admitting: *Deleted

## 2013-12-11 DIAGNOSIS — I6350 Cerebral infarction due to unspecified occlusion or stenosis of unspecified cerebral artery: Secondary | ICD-10-CM

## 2013-12-11 DIAGNOSIS — Z5181 Encounter for therapeutic drug level monitoring: Secondary | ICD-10-CM

## 2013-12-11 DIAGNOSIS — I4891 Unspecified atrial fibrillation: Secondary | ICD-10-CM

## 2013-12-11 DIAGNOSIS — Z7901 Long term (current) use of anticoagulants: Secondary | ICD-10-CM

## 2013-12-11 LAB — POCT INR: INR: 2.2

## 2014-01-08 ENCOUNTER — Ambulatory Visit (INDEPENDENT_AMBULATORY_CARE_PROVIDER_SITE_OTHER): Payer: BC Managed Care – PPO | Admitting: *Deleted

## 2014-01-08 DIAGNOSIS — Z5181 Encounter for therapeutic drug level monitoring: Secondary | ICD-10-CM

## 2014-01-08 DIAGNOSIS — I4891 Unspecified atrial fibrillation: Secondary | ICD-10-CM

## 2014-01-08 DIAGNOSIS — I6350 Cerebral infarction due to unspecified occlusion or stenosis of unspecified cerebral artery: Secondary | ICD-10-CM

## 2014-01-08 DIAGNOSIS — Z7901 Long term (current) use of anticoagulants: Secondary | ICD-10-CM

## 2014-01-08 LAB — POCT INR: INR: 1.7

## 2014-01-29 ENCOUNTER — Ambulatory Visit (INDEPENDENT_AMBULATORY_CARE_PROVIDER_SITE_OTHER): Payer: BC Managed Care – PPO | Admitting: *Deleted

## 2014-01-29 DIAGNOSIS — I4891 Unspecified atrial fibrillation: Secondary | ICD-10-CM

## 2014-01-29 DIAGNOSIS — Z7901 Long term (current) use of anticoagulants: Secondary | ICD-10-CM

## 2014-01-29 DIAGNOSIS — Z5181 Encounter for therapeutic drug level monitoring: Secondary | ICD-10-CM

## 2014-01-29 DIAGNOSIS — I6350 Cerebral infarction due to unspecified occlusion or stenosis of unspecified cerebral artery: Secondary | ICD-10-CM

## 2014-01-29 LAB — POCT INR: INR: 1.9

## 2014-02-03 ENCOUNTER — Ambulatory Visit (INDEPENDENT_AMBULATORY_CARE_PROVIDER_SITE_OTHER): Payer: BC Managed Care – PPO | Admitting: *Deleted

## 2014-02-03 DIAGNOSIS — I4891 Unspecified atrial fibrillation: Secondary | ICD-10-CM

## 2014-02-03 NOTE — Progress Notes (Signed)
Remote ICD transmission.   

## 2014-02-04 ENCOUNTER — Ambulatory Visit (INDEPENDENT_AMBULATORY_CARE_PROVIDER_SITE_OTHER): Payer: BC Managed Care – PPO | Admitting: Urology

## 2014-02-04 ENCOUNTER — Ambulatory Visit (INDEPENDENT_AMBULATORY_CARE_PROVIDER_SITE_OTHER): Payer: BC Managed Care – PPO | Admitting: Cardiology

## 2014-02-04 ENCOUNTER — Encounter: Payer: Self-pay | Admitting: Cardiology

## 2014-02-04 VITALS — BP 107/70 | HR 55 | Ht 73.0 in | Wt 233.4 lb

## 2014-02-04 DIAGNOSIS — I2510 Atherosclerotic heart disease of native coronary artery without angina pectoris: Secondary | ICD-10-CM

## 2014-02-04 DIAGNOSIS — N400 Enlarged prostate without lower urinary tract symptoms: Secondary | ICD-10-CM

## 2014-02-04 DIAGNOSIS — E785 Hyperlipidemia, unspecified: Secondary | ICD-10-CM

## 2014-02-04 DIAGNOSIS — I4891 Unspecified atrial fibrillation: Secondary | ICD-10-CM

## 2014-02-04 DIAGNOSIS — I2589 Other forms of chronic ischemic heart disease: Secondary | ICD-10-CM

## 2014-02-04 NOTE — Assessment & Plan Note (Signed)
Continues on Crestor, lipids have been followed by Dr. Nadara Mustard.

## 2014-02-04 NOTE — Assessment & Plan Note (Signed)
Symptomatically stable, prior history of anterior infarct as outlined, patent stent sites in the LAD and obtuse marginal system as of November 2013. Plan to continue medical therapy and observation. I did encourage regular walking regimen for exercise.

## 2014-02-04 NOTE — Progress Notes (Signed)
Clinical Summary [name] is a 63 y.o.male last seen in August 2014. He continues with interval followup through the device clinic. He has been doing well, still works full-time at [name]. He has not been exercising regularly, we did discuss this am today. Wants to lose some weight. He reports compliance with his medications.  ECG shows sinus bradycardia with leftward axis and old anterior infarct pattern.  Echocardiogram from November 2011 demonstrated LVEF approximately 35% with anteroseptal and apical akinesis, mild mitral regurgitation, mild tricuspid regurgitation, normal RV contraction with device wire present.  He is due for a remote device transmission. Still follows in the device clinic.  No Known Allergies  Current Outpatient Prescriptions  Medication Sig Dispense Refill  . aspirin 81 MG tablet Take 81 mg by mouth daily.        . bisoprolol (ZEBETA) 5 MG tablet Take 1 tablet (5 mg total) by mouth daily.  30 tablet  6  . COUMADIN 5 MG tablet Take 1 tablet on Sunday, Tuesday, Thursday and Saturday and take 1/2 tablet all other days.  45 tablet  3  . finasteride (PROSCAR) 5 MG tablet Take 5 mg by mouth Daily.       [name] losartan (COZAAR) 50 MG tablet TAKE 1 TABLET BY MOUTH EVERY DAY  30 tablet  6  . LOVAZA 1 G capsule Take 1 capsule by mouth Twice daily.      . nitroGLYCERIN (NITROSTAT) 0.4 MG SL tablet Place 1 tablet (0.4 mg total) under the tongue every 5 (five) minutes as needed for chest pain.  90 tablet  3  . rosuvastatin (CRESTOR) 20 MG tablet Take 1 tablet (20 mg total) by mouth daily.  30 tablet  6  . spironolactone (ALDACTONE) 25 MG tablet TAKE 1 TABLET BY MOUTH EVERY DAY  30 tablet  6   No current facility-administered medications for this visit.    Past Medical History  Diagnosis Date  . Coronary atherosclerosis of native coronary artery     BMS to LAD an OM1 - patent at catheterization 11/13   . Anterior myocardial infarction   . Chronic systolic heart failure    . Paroxysmal atrial fibrillation     On coumadin  . Stroke   . Pseudoaneurysm   . A-V fistula   . Inducible ventricular tachycardia   . Ischemic cardiomyopathy     LVEF 30%, status post ICD    Past Surgical History  Procedure Laterality Date  . Aicd implantation  2011    St Jude Current ICD    Social History [name] reports that he has never smoked. He has never used smokeless tobacco. [name] reports that he does not drink alcohol.  Review of Systems No palpitations or device discharges. No orthopnea or PND. Otherwise negative.  Physical Examination Filed Vitals:   02/04/14 1304  BP: 107/70  Pulse: 55   Filed Weights   02/04/14 1304  Weight: 233 lb 6.4 oz (105.87 kg)    No acute distress.  HEENT: Conjunctiva and lids normal, oropharynx clear.  Neck: Supple, no elevated JVP or carotid bruits, no thyromegaly.  Lungs: Clear to auscultation, nonlabored breathing at rest.  Thorax: Stable device pocket site.  Cardiac: Regular rate and rhythm, no S3 or significant systolic murmur, no pericardial rub.  Abdomen: Soft, nontender, bowel sounds present, no guarding or rebound.  Extremities: No pitting edema, distal pulses 2+.    Problem List and Plan   Coronary atherosclerosis of native coronary  artery Symptomatically stable, prior history of anterior infarct as outlined, patent stent sites in the LAD and obtuse marginal system as of November 2013. Plan to continue medical therapy and observation. I did encourage regular walking regimen for exercise.  CARDIOMYOPATHY, ISCHEMIC LVEF approximately 35% status post ICD. He is symptomatically stable.  Atrial fibrillation History of PAF, in sinus rhythm today. Continues on Coumadin.  DYSLIPIDEMIA Continues on Crestor, lipids have been followed by Dr. Nadara Smith.    [name], M.D., F.A.C.C.

## 2014-02-04 NOTE — Assessment & Plan Note (Signed)
LVEF approximately 35% status post ICD. He is symptomatically stable.

## 2014-02-04 NOTE — Assessment & Plan Note (Signed)
History of PAF, in sinus rhythm today. Continues on Coumadin.

## 2014-02-04 NOTE — Patient Instructions (Signed)

## 2014-02-05 ENCOUNTER — Encounter: Payer: Self-pay | Admitting: Internal Medicine

## 2014-02-05 LAB — MDC_IDC_ENUM_SESS_TYPE_REMOTE
Battery Remaining Longevity: 51 mo
Battery Voltage: 2.57 V
Brady Statistic RV Percent Paced: 1 %
HighPow Impedance: 48 Ohm
Implantable Pulse Generator Serial Number: 557946
Lead Channel Sensing Intrinsic Amplitude: 11.2 mV
Lead Channel Setting Pacing Amplitude: 2.5 V
Lead Channel Setting Pacing Pulse Width: 0.5 ms
MDC IDC MSMT LEADCHNL RV IMPEDANCE VALUE: 630 Ohm
MDC IDC SET LEADCHNL RV SENSING SENSITIVITY: 0.3 mV
Zone Setting Detection Interval: 250 ms
Zone Setting Detection Interval: 300 ms

## 2014-02-14 ENCOUNTER — Encounter: Payer: Self-pay | Admitting: Cardiology

## 2014-02-28 ENCOUNTER — Other Ambulatory Visit: Payer: Self-pay | Admitting: Cardiology

## 2014-03-03 ENCOUNTER — Ambulatory Visit (INDEPENDENT_AMBULATORY_CARE_PROVIDER_SITE_OTHER): Payer: BC Managed Care – PPO | Admitting: *Deleted

## 2014-03-03 DIAGNOSIS — I6350 Cerebral infarction due to unspecified occlusion or stenosis of unspecified cerebral artery: Secondary | ICD-10-CM

## 2014-03-03 DIAGNOSIS — I4891 Unspecified atrial fibrillation: Secondary | ICD-10-CM

## 2014-03-03 DIAGNOSIS — Z5181 Encounter for therapeutic drug level monitoring: Secondary | ICD-10-CM

## 2014-03-03 DIAGNOSIS — Z7901 Long term (current) use of anticoagulants: Secondary | ICD-10-CM

## 2014-03-03 LAB — POCT INR: INR: 2.5

## 2014-04-02 ENCOUNTER — Ambulatory Visit (INDEPENDENT_AMBULATORY_CARE_PROVIDER_SITE_OTHER): Payer: BC Managed Care – PPO | Admitting: *Deleted

## 2014-04-02 DIAGNOSIS — I480 Paroxysmal atrial fibrillation: Secondary | ICD-10-CM

## 2014-04-02 DIAGNOSIS — I6350 Cerebral infarction due to unspecified occlusion or stenosis of unspecified cerebral artery: Secondary | ICD-10-CM

## 2014-04-02 DIAGNOSIS — Z5181 Encounter for therapeutic drug level monitoring: Secondary | ICD-10-CM

## 2014-04-02 DIAGNOSIS — I4891 Unspecified atrial fibrillation: Secondary | ICD-10-CM

## 2014-04-02 DIAGNOSIS — Z7901 Long term (current) use of anticoagulants: Secondary | ICD-10-CM

## 2014-04-02 LAB — POCT INR: INR: 2.5

## 2014-04-09 ENCOUNTER — Other Ambulatory Visit: Payer: Self-pay | Admitting: Cardiology

## 2014-04-24 ENCOUNTER — Other Ambulatory Visit: Payer: Self-pay | Admitting: Cardiology

## 2014-04-30 ENCOUNTER — Ambulatory Visit (INDEPENDENT_AMBULATORY_CARE_PROVIDER_SITE_OTHER): Payer: BC Managed Care – PPO | Admitting: *Deleted

## 2014-04-30 DIAGNOSIS — Z7901 Long term (current) use of anticoagulants: Secondary | ICD-10-CM

## 2014-04-30 DIAGNOSIS — Z5181 Encounter for therapeutic drug level monitoring: Secondary | ICD-10-CM

## 2014-04-30 DIAGNOSIS — I6350 Cerebral infarction due to unspecified occlusion or stenosis of unspecified cerebral artery: Secondary | ICD-10-CM

## 2014-04-30 DIAGNOSIS — I482 Chronic atrial fibrillation, unspecified: Secondary | ICD-10-CM

## 2014-04-30 DIAGNOSIS — I4891 Unspecified atrial fibrillation: Secondary | ICD-10-CM

## 2014-04-30 LAB — POCT INR: INR: 2.4

## 2014-05-07 ENCOUNTER — Ambulatory Visit (INDEPENDENT_AMBULATORY_CARE_PROVIDER_SITE_OTHER): Payer: BC Managed Care – PPO | Admitting: *Deleted

## 2014-05-07 ENCOUNTER — Encounter: Payer: Self-pay | Admitting: Internal Medicine

## 2014-05-07 DIAGNOSIS — I480 Paroxysmal atrial fibrillation: Secondary | ICD-10-CM

## 2014-05-07 DIAGNOSIS — I4891 Unspecified atrial fibrillation: Secondary | ICD-10-CM

## 2014-05-07 DIAGNOSIS — I428 Other cardiomyopathies: Secondary | ICD-10-CM

## 2014-05-07 NOTE — Progress Notes (Signed)
Remote ICD transmission.   

## 2014-05-15 LAB — MDC_IDC_ENUM_SESS_TYPE_REMOTE
Lead Channel Setting Pacing Amplitude: 2.5 V
Lead Channel Setting Pacing Pulse Width: 0.5 ms
Lead Channel Setting Sensing Sensitivity: 0.3 mV
MDC IDC MSMT LEADCHNL RV SENSING INTR AMPL: 11 mV
MDC IDC PG SERIAL: 557946
MDC IDC SET ZONE DETECTION INTERVAL: 300 ms
Zone Setting Detection Interval: 250 ms

## 2014-05-20 ENCOUNTER — Encounter: Payer: Self-pay | Admitting: Cardiology

## 2014-05-22 ENCOUNTER — Encounter: Payer: Self-pay | Admitting: Cardiology

## 2014-05-28 ENCOUNTER — Ambulatory Visit (INDEPENDENT_AMBULATORY_CARE_PROVIDER_SITE_OTHER): Payer: BC Managed Care – PPO | Admitting: *Deleted

## 2014-05-28 DIAGNOSIS — Z5181 Encounter for therapeutic drug level monitoring: Secondary | ICD-10-CM

## 2014-05-28 DIAGNOSIS — Z7901 Long term (current) use of anticoagulants: Secondary | ICD-10-CM

## 2014-05-28 DIAGNOSIS — I6350 Cerebral infarction due to unspecified occlusion or stenosis of unspecified cerebral artery: Secondary | ICD-10-CM

## 2014-05-28 DIAGNOSIS — I4891 Unspecified atrial fibrillation: Secondary | ICD-10-CM

## 2014-05-28 LAB — POCT INR: INR: 2.4

## 2014-06-07 ENCOUNTER — Other Ambulatory Visit: Payer: Self-pay | Admitting: Cardiology

## 2014-07-09 ENCOUNTER — Ambulatory Visit (INDEPENDENT_AMBULATORY_CARE_PROVIDER_SITE_OTHER): Payer: BC Managed Care – PPO | Admitting: *Deleted

## 2014-07-09 DIAGNOSIS — Z7901 Long term (current) use of anticoagulants: Secondary | ICD-10-CM

## 2014-07-09 DIAGNOSIS — I639 Cerebral infarction, unspecified: Secondary | ICD-10-CM

## 2014-07-09 DIAGNOSIS — Z5181 Encounter for therapeutic drug level monitoring: Secondary | ICD-10-CM

## 2014-07-09 DIAGNOSIS — I6350 Cerebral infarction due to unspecified occlusion or stenosis of unspecified cerebral artery: Secondary | ICD-10-CM

## 2014-07-09 DIAGNOSIS — I4891 Unspecified atrial fibrillation: Secondary | ICD-10-CM

## 2014-07-09 LAB — POCT INR: INR: 2

## 2014-08-11 ENCOUNTER — Ambulatory Visit (INDEPENDENT_AMBULATORY_CARE_PROVIDER_SITE_OTHER): Payer: BC Managed Care – PPO | Admitting: Cardiology

## 2014-08-11 ENCOUNTER — Encounter: Payer: Self-pay | Admitting: *Deleted

## 2014-08-11 ENCOUNTER — Encounter: Payer: Self-pay | Admitting: Cardiology

## 2014-08-11 VITALS — BP 91/63 | HR 61 | Ht 73.0 in | Wt 227.1 lb

## 2014-08-11 DIAGNOSIS — I480 Paroxysmal atrial fibrillation: Secondary | ICD-10-CM

## 2014-08-11 DIAGNOSIS — I2510 Atherosclerotic heart disease of native coronary artery without angina pectoris: Secondary | ICD-10-CM

## 2014-08-11 DIAGNOSIS — I255 Ischemic cardiomyopathy: Secondary | ICD-10-CM

## 2014-08-11 DIAGNOSIS — R0602 Shortness of breath: Secondary | ICD-10-CM

## 2014-08-11 DIAGNOSIS — E782 Mixed hyperlipidemia: Secondary | ICD-10-CM

## 2014-08-11 NOTE — Assessment & Plan Note (Signed)
Paroxysmal, continues on Coumadin. Reports no palpitations or device discharges.

## 2014-08-11 NOTE — Assessment & Plan Note (Signed)
He is reporting increasing angina and shortness of breath over the last few months on stable medical regimen. Plan is to follow-up with a Lexiscan Cardiolite to reassess ischemic burden. We will plan to call him with the results.

## 2014-08-11 NOTE — Patient Instructions (Signed)

## 2014-08-11 NOTE — Assessment & Plan Note (Signed)
LVEF 35%, no evidence of volume overload on examination today. He is status post seen Jude ICD, followed by [name].

## 2014-08-11 NOTE — Assessment & Plan Note (Signed)
Continues on Crestor.

## 2014-08-11 NOTE — Progress Notes (Signed)
Reason for visit: CAD, cardiomyopathy, atrial fibrillation  Clinical Summary [name] is an 63 y.o.male last seen in May. He continues to follow in the device clinic and also Coumadin clinic. He presents for a routine visit today, states that he has been experiencing more regular angina symptoms with exertion and also increased shortness of breath, at times up to NYHA class III. This has been present over the last few months, otherwise no change in his baseline medical regimen which includes aspirin, beta blocker, ARB, aldactone and nitroglycerin.  Heart catheterization in 2013 revealed patent stent sites in the LAD and first obtuse marginal. He has not had follow-up ischemic testing since that time. LVEF has been stable with moderate LV dysfunction. Today we discussed proceeding with follow-up ischemic evaluation to reassess ischemic burden and help determine whether we need to pursue invasive evaluation.  Echocardiogram from November 2011 demonstrated LVEF approximately 35% with anteroseptal and apical akinesis, mild mitral regurgitation, mild tricuspid regurgitation, normal RV contraction with device wire present.  No Known Allergies  Current Outpatient Prescriptions  Medication Sig Dispense Refill  . aspirin 81 MG tablet Take 81 mg by mouth daily.      . bisoprolol (ZEBETA) 5 MG tablet TAKE 1 TABLET (5 MG TOTAL) BY MOUTH DAILY. 30 tablet 6  . CRESTOR 20 MG tablet TAKE 1 TABLET BY MOUTH EVERY DAY 30 tablet 6  . finasteride (PROSCAR) 5 MG tablet Take 5 mg by mouth Daily.     [name] losartan (COZAAR) 50 MG tablet TAKE 1 TABLET BY MOUTH EVERY DAY 30 tablet 6  . LOVAZA 1 G capsule Take 1 capsule by mouth Twice daily.    . nitroGLYCERIN (NITROSTAT) 0.4 MG SL tablet Place 1 tablet (0.4 mg total) under the tongue every 5 (five) minutes as needed for chest pain. 90 tablet 3  . spironolactone (ALDACTONE) 25 MG tablet TAKE 1 TABLET BY MOUTH EVERY DAY 30 tablet 6  . warfarin (COUMADIN) 5 MG tablet Take  1 tablet daily except 1/2 tablet on Mondays and Fridays 45 tablet 6   No current facility-administered medications for this visit.    Past Medical History  Diagnosis Date  . Coronary atherosclerosis of native coronary artery     BMS to LAD an OM1 - patent at catheterization 11/13   . Anterior myocardial infarction   . Chronic systolic heart failure   . Paroxysmal atrial fibrillation     On coumadin  . Stroke   . Pseudoaneurysm   . A-V fistula   . Inducible ventricular tachycardia   . Ischemic cardiomyopathy     LVEF 30%, status post ICD    Past Surgical History  Procedure Laterality Date  . Aicd implantation  2011    St Jude Current ICD    Social History [name] reports that he has never smoked. He has never used smokeless tobacco. [name] reports that he does not drink alcohol.  Review of Systems Complete review of systems negative except as otherwise outlined in the clinical summary and also the following. No palpitations, no orthopnea or PND. No claudication.  Physical Examination Filed Vitals:   08/11/14 1541  BP: 91/63  Pulse: 61   Filed Weights   08/11/14 1541  Weight: 227 lb 1.9 oz (103.021 kg)    Appears comfortable at rest. HEENT: Conjunctiva and lids normal, oropharynx clear.  Neck: Supple, no elevated JVP or carotid bruits, no thyromegaly.  Lungs: Clear to auscultation, nonlabored breathing at rest.  Thorax: Stable device  pocket site.  Cardiac: Regular rate and rhythm, no S3 or significant systolic murmur, no pericardial rub.  Abdomen: Soft, nontender, bowel sounds present, no guarding or rebound.  Extremities: No pitting edema, distal pulses 2+.  Skin: Warm and dry. Musculoskeletal: No kyphosis. Neuropsychiatric: Alert and oriented 3, affect appropriate.   Problem List and Plan   Coronary atherosclerosis of native coronary artery He is reporting increasing angina and shortness of breath over the last few months on stable medical  regimen. Plan is to follow-up with a Lexiscan Cardiolite to reassess ischemic burden. We will plan to call him with the results.  Cardiomyopathy, ischemic LVEF 35%, no evidence of volume overload on examination today. He is status post seen Jude ICD, followed by [name].  Atrial fibrillation Paroxysmal, continues on Coumadin. Reports no palpitations or device discharges.  Mixed hyperlipidemia Continues on Crestor.    Satira Sark, M.D., F.A.C.C.

## 2014-08-20 ENCOUNTER — Ambulatory Visit (INDEPENDENT_AMBULATORY_CARE_PROVIDER_SITE_OTHER): Payer: BC Managed Care – PPO | Admitting: *Deleted

## 2014-08-20 DIAGNOSIS — I639 Cerebral infarction, unspecified: Secondary | ICD-10-CM

## 2014-08-20 DIAGNOSIS — I6350 Cerebral infarction due to unspecified occlusion or stenosis of unspecified cerebral artery: Secondary | ICD-10-CM

## 2014-08-20 DIAGNOSIS — I4891 Unspecified atrial fibrillation: Secondary | ICD-10-CM

## 2014-08-20 DIAGNOSIS — Z5181 Encounter for therapeutic drug level monitoring: Secondary | ICD-10-CM

## 2014-08-20 DIAGNOSIS — Z7901 Long term (current) use of anticoagulants: Secondary | ICD-10-CM

## 2014-08-20 LAB — POCT INR: INR: 2.2

## 2014-08-22 ENCOUNTER — Encounter (HOSPITAL_COMMUNITY)
Admission: RE | Admit: 2014-08-22 | Discharge: 2014-08-22 | Disposition: A | Discharge status: Home / Self Care | Payer: BC Managed Care – PPO | Source: Ambulatory Visit | Attending: Cardiology | Admitting: Cardiology

## 2014-08-22 ENCOUNTER — Ambulatory Visit (HOSPITAL_COMMUNITY)
Admission: RE | Admit: 2014-08-22 | Discharge: 2014-08-22 | Disposition: A | Discharge status: Home / Self Care | Payer: BC Managed Care – PPO | Source: Ambulatory Visit | Attending: Cardiology | Admitting: Cardiology

## 2014-08-22 ENCOUNTER — Encounter (HOSPITAL_COMMUNITY): Payer: Self-pay

## 2014-08-22 DIAGNOSIS — I252 Old myocardial infarction: Secondary | ICD-10-CM | POA: Diagnosis not present

## 2014-08-22 DIAGNOSIS — Z955 Presence of coronary angioplasty implant and graft: Secondary | ICD-10-CM | POA: Insufficient documentation

## 2014-08-22 DIAGNOSIS — R0602 Shortness of breath: Secondary | ICD-10-CM | POA: Insufficient documentation

## 2014-08-22 DIAGNOSIS — I255 Ischemic cardiomyopathy: Secondary | ICD-10-CM | POA: Insufficient documentation

## 2014-08-22 DIAGNOSIS — I2510 Atherosclerotic heart disease of native coronary artery without angina pectoris: Secondary | ICD-10-CM | POA: Insufficient documentation

## 2014-08-22 MED ORDER — TECHNETIUM TC 99M SESTAMIBI - CARDIOLITE
30.0000 | Freq: Once | INTRAVENOUS | Status: AC | PRN
  Administered 2014-08-22: 30 via INTRAVENOUS

## 2014-08-22 MED ORDER — TECHNETIUM TC 99M SESTAMIBI GENERIC - CARDIOLITE
10.0000 | Freq: Once | INTRAVENOUS | Status: AC | PRN
  Administered 2014-08-22: 10 via INTRAVENOUS

## 2014-08-22 MED ORDER — REGADENOSON 0.4 MG/5ML IV SOLN
0.4000 mg | Freq: Once | INTRAVENOUS | Status: AC
  Administered 2014-08-22: 0.4 mg via INTRAVENOUS

## 2014-08-22 MED ORDER — SODIUM CHLORIDE 0.9 % IJ SOLN
10.0000 mL | INTRAMUSCULAR | Status: DC | PRN
  Administered 2014-08-22: 10 mL via INTRAVENOUS
  Filled 2014-08-22: qty 10

## 2014-08-22 NOTE — Progress Notes (Signed)
Stress Lab Nurses Notes - [facility] 08/22/2014 Reason for doing test: CAD Type of test: Wille Glaser Nurse performing test: Felicity Coyer, RN Nuclear Medicine Tech: Melburn Hake Echo Tech: Not Applicable MD performing test: S. McDowell/K. Purcell Nails, NP Family MD: Dr. Rory Percy Test explained and consent signed: Yes.   IV started: Saline lock flushed, No redness or edema and Saline lock started in radiology Symptoms: Flushed Treatment/Intervention: None Reason test stopped: protocol completed After recovery IV was: Discontinued via X-ray tech Patient to return to [facility]. Med at : 1120 Patient discharged: Home Patient's Condition upon discharge was: stable Comments:  During test BP 94/68 & HR 73 and in recovery BP 108/64 & HR 53.  Symptoms resolved in recovery.  Gus Rankin

## 2014-08-29 ENCOUNTER — Encounter: Payer: Self-pay | Admitting: *Deleted

## 2014-08-29 ENCOUNTER — Encounter: Payer: Self-pay | Admitting: Internal Medicine

## 2014-08-29 ENCOUNTER — Ambulatory Visit (INDEPENDENT_AMBULATORY_CARE_PROVIDER_SITE_OTHER): Payer: BC Managed Care – PPO | Admitting: Internal Medicine

## 2014-08-29 VITALS — BP 100/65 | HR 52 | Ht 73.0 in | Wt 228.8 lb

## 2014-08-29 DIAGNOSIS — I2510 Atherosclerotic heart disease of native coronary artery without angina pectoris: Secondary | ICD-10-CM | POA: Diagnosis not present

## 2014-08-29 DIAGNOSIS — I5022 Chronic systolic (congestive) heart failure: Secondary | ICD-10-CM | POA: Diagnosis not present

## 2014-08-29 DIAGNOSIS — I480 Paroxysmal atrial fibrillation: Secondary | ICD-10-CM | POA: Diagnosis not present

## 2014-08-29 DIAGNOSIS — I255 Ischemic cardiomyopathy: Secondary | ICD-10-CM

## 2014-08-29 LAB — MDC_IDC_ENUM_SESS_TYPE_INCLINIC
Battery Remaining Longevity: 28.8 mo
Brady Statistic RV Percent Paced: 1 %
HighPow Impedance: 50.9063
Implantable Pulse Generator Serial Number: 557946
Lead Channel Pacing Threshold Amplitude: 1 V
Lead Channel Pacing Threshold Pulse Width: 0.5 ms
Lead Channel Sensing Intrinsic Amplitude: 11.7 mV
Lead Channel Setting Pacing Amplitude: 2.5 V
Lead Channel Setting Pacing Pulse Width: 0.5 ms
Lead Channel Setting Sensing Sensitivity: 0.3 mV
MDC IDC MSMT BATTERY VOLTAGE: 2.56 V
MDC IDC MSMT LEADCHNL RV IMPEDANCE VALUE: 700 Ohm
MDC IDC SESS DTM: 20151211093057
MDC IDC SET ZONE DETECTION INTERVAL: 250 ms
MDC IDC SET ZONE DETECTION INTERVAL: 300 ms

## 2014-08-29 NOTE — Progress Notes (Signed)
PCP: Rory Percy, MD Primary Cardiologist: Dr Domenic Polite  The patient presents today for routine electrophysiology followup.  Since last being seen in our clinic, the patient reports doing very well.   He continues to have occasional fatigue and SOB but remains active.  He recently underwent stress testing by Dr Domenic Polite.  Today, he denies symptoms of palpitations, chest pain, orthopnea, PND, lower extremity edema, presyncope, syncope, or neurologic sequela.  The patient feels that he is tolerating medications without difficulties and is otherwise without complaint today.   Past Medical History  Diagnosis Date  . Coronary atherosclerosis of native coronary artery     BMS to LAD an OM1 - patent at catheterization 11/13   . Anterior myocardial infarction   . Chronic systolic heart failure   . Paroxysmal atrial fibrillation     On coumadin  . Stroke   . Pseudoaneurysm   . A-V fistula   . Inducible ventricular tachycardia   . Ischemic cardiomyopathy     LVEF 30%, status post ICD   Past Surgical History  Procedure Laterality Date  . Aicd implantation  2011    St Jude Current ICD    Current Outpatient Prescriptions  Medication Sig Dispense Refill  . aspirin 81 MG tablet Take 81 mg by mouth daily.      . bisoprolol (ZEBETA) 5 MG tablet TAKE 1 TABLET (5 MG TOTAL) BY MOUTH DAILY. 30 tablet 6  . CRESTOR 20 MG tablet TAKE 1 TABLET BY MOUTH EVERY DAY 30 tablet 6  . finasteride (PROSCAR) 5 MG tablet Take 5 mg by mouth Daily.     [name] losartan (COZAAR) 50 MG tablet TAKE 1 TABLET BY MOUTH EVERY DAY 30 tablet 6  . LOVAZA 1 G capsule Take 1 capsule by mouth Twice daily.    . nitroGLYCERIN (NITROSTAT) 0.4 MG SL tablet Place 1 tablet (0.4 mg total) under the tongue every 5 (five) minutes as needed for chest pain. 90 tablet 3  . spironolactone (ALDACTONE) 25 MG tablet TAKE 1 TABLET BY MOUTH EVERY DAY 30 tablet 6  . warfarin (COUMADIN) 5 MG tablet Take 1 tablet daily except 1/2 tablet on Mondays and  Fridays 45 tablet 6   No current facility-administered medications for this visit.    No Known Allergies  History   Social History  . Marital Status: Married    Spouse Name: N/A    Number of Children: N/A  . Years of Education: N/A   Occupational History  . Not on file.   Social History Main Topics  . Smoking status: Never Smoker   . Smokeless tobacco: Never Used  . Alcohol Use: No  . Drug Use: No  . Sexual Activity: Not on file   Other Topics Concern  . Not on file   Social History Narrative    Physical Exam: Filed Vitals:   08/29/14 0906  BP: 100/65  Pulse: 52  Height: 6\' 1"  (1.854 m)  Weight: 228 lb 12.8 oz (103.783 kg)  SpO2: 97%    GEN- The patient is well appearing, alert and oriented x 3 today.   Head- normocephalic, atraumatic Eyes-  Sclera clear, conjunctiva pink Ears- hearing intact Oropharynx- clear Neck- supple, no JVP Lymph- no cervical lymphadenopathy Lungs- Clear to ausculation bilaterally, normal work of breathing Chest- ICD pocket is well healed Heart- Regular rate and rhythm, no murmurs, rubs or gallops, PMI not laterally displaced GI- soft, NT, ND, + BS Extremities- no clubbing, cyanosis, or edema  ICD interrogation- reviewed in  detail today,  See PACEART report  Assessment and Plan:  1. CAD/ ischemic CM/ chronic systolic dysfunction Normal ICD function See Pace Art report No changes today  2. afib Continue coumadin  3. Overweight Lifestyle modification is encouraged  Merlin Return in 1 year

## 2014-08-29 NOTE — Patient Instructions (Signed)
Your physician recommends that you schedule a follow-up appointment in: 1 year. You will receive a reminder letter in the mail in about 10 months reminding you to call and schedule your appointment. If you don't receive this letter, please contact our office. Merlin device check on 11/27/14. Your physician recommends that you continue on your current medications as directed. Please refer to the Current Medication list given to you today.

## 2014-10-01 ENCOUNTER — Ambulatory Visit (INDEPENDENT_AMBULATORY_CARE_PROVIDER_SITE_OTHER): Payer: BC Managed Care – PPO | Admitting: *Deleted

## 2014-10-01 DIAGNOSIS — Z7901 Long term (current) use of anticoagulants: Secondary | ICD-10-CM

## 2014-10-01 DIAGNOSIS — I4891 Unspecified atrial fibrillation: Secondary | ICD-10-CM

## 2014-10-01 DIAGNOSIS — I6350 Cerebral infarction due to unspecified occlusion or stenosis of unspecified cerebral artery: Secondary | ICD-10-CM

## 2014-10-01 DIAGNOSIS — I639 Cerebral infarction, unspecified: Secondary | ICD-10-CM

## 2014-10-01 DIAGNOSIS — Z5181 Encounter for therapeutic drug level monitoring: Secondary | ICD-10-CM

## 2014-10-01 LAB — POCT INR: INR: 2.3

## 2014-10-06 ENCOUNTER — Other Ambulatory Visit: Payer: Self-pay | Admitting: Cardiology

## 2014-11-07 ENCOUNTER — Other Ambulatory Visit: Payer: Self-pay | Admitting: Cardiology

## 2014-11-12 ENCOUNTER — Ambulatory Visit (INDEPENDENT_AMBULATORY_CARE_PROVIDER_SITE_OTHER): Payer: BC Managed Care – PPO | Admitting: *Deleted

## 2014-11-12 DIAGNOSIS — I4891 Unspecified atrial fibrillation: Secondary | ICD-10-CM

## 2014-11-12 DIAGNOSIS — I6350 Cerebral infarction due to unspecified occlusion or stenosis of unspecified cerebral artery: Secondary | ICD-10-CM

## 2014-11-12 DIAGNOSIS — Z5181 Encounter for therapeutic drug level monitoring: Secondary | ICD-10-CM

## 2014-11-12 DIAGNOSIS — I480 Paroxysmal atrial fibrillation: Secondary | ICD-10-CM

## 2014-11-12 DIAGNOSIS — Z7901 Long term (current) use of anticoagulants: Secondary | ICD-10-CM

## 2014-11-12 DIAGNOSIS — I639 Cerebral infarction, unspecified: Secondary | ICD-10-CM

## 2014-11-12 LAB — POCT INR: INR: 1.2

## 2014-11-26 ENCOUNTER — Ambulatory Visit (INDEPENDENT_AMBULATORY_CARE_PROVIDER_SITE_OTHER): Payer: BC Managed Care – PPO | Admitting: *Deleted

## 2014-11-26 DIAGNOSIS — I4891 Unspecified atrial fibrillation: Secondary | ICD-10-CM

## 2014-11-26 DIAGNOSIS — I480 Paroxysmal atrial fibrillation: Secondary | ICD-10-CM

## 2014-11-26 DIAGNOSIS — Z5181 Encounter for therapeutic drug level monitoring: Secondary | ICD-10-CM

## 2014-11-26 DIAGNOSIS — I639 Cerebral infarction, unspecified: Secondary | ICD-10-CM

## 2014-11-26 DIAGNOSIS — Z7901 Long term (current) use of anticoagulants: Secondary | ICD-10-CM

## 2014-11-26 DIAGNOSIS — I6350 Cerebral infarction due to unspecified occlusion or stenosis of unspecified cerebral artery: Secondary | ICD-10-CM

## 2014-11-26 LAB — POCT INR: INR: 3.2

## 2014-11-27 ENCOUNTER — Ambulatory Visit (INDEPENDENT_AMBULATORY_CARE_PROVIDER_SITE_OTHER): Payer: BLUE CROSS/BLUE SHIELD | Admitting: *Deleted

## 2014-11-27 DIAGNOSIS — I255 Ischemic cardiomyopathy: Secondary | ICD-10-CM

## 2014-11-27 NOTE — Progress Notes (Signed)
Remote ICD transmission.   

## 2014-11-28 LAB — MDC_IDC_ENUM_SESS_TYPE_REMOTE
Battery Remaining Longevity: 18 mo
Brady Statistic RV Percent Paced: 1 %
HIGH POWER IMPEDANCE MEASURED VALUE: 52 Ohm
Lead Channel Pacing Threshold Amplitude: 1 V
Lead Channel Pacing Threshold Pulse Width: 0.5 ms
Lead Channel Setting Pacing Amplitude: 2.5 V
Lead Channel Setting Pacing Pulse Width: 0.5 ms
MDC IDC MSMT BATTERY VOLTAGE: 2.54 V
MDC IDC MSMT LEADCHNL RV IMPEDANCE VALUE: 730 Ohm
MDC IDC MSMT LEADCHNL RV SENSING INTR AMPL: 11.7 mV
MDC IDC PG SERIAL: 557946
MDC IDC SESS DTM: 20160310075230
MDC IDC SET LEADCHNL RV SENSING SENSITIVITY: 0.3 mV
MDC IDC SET ZONE DETECTION INTERVAL: 300 ms
Zone Setting Detection Interval: 250 ms

## 2014-12-01 ENCOUNTER — Encounter: Payer: Self-pay | Admitting: Cardiology

## 2014-12-05 ENCOUNTER — Encounter: Payer: Self-pay | Admitting: Internal Medicine

## 2014-12-15 ENCOUNTER — Encounter: Payer: Self-pay | Admitting: Cardiology

## 2014-12-24 ENCOUNTER — Ambulatory Visit (INDEPENDENT_AMBULATORY_CARE_PROVIDER_SITE_OTHER): Payer: BLUE CROSS/BLUE SHIELD | Admitting: *Deleted

## 2014-12-24 DIAGNOSIS — I639 Cerebral infarction, unspecified: Secondary | ICD-10-CM | POA: Diagnosis not present

## 2014-12-24 DIAGNOSIS — I4891 Unspecified atrial fibrillation: Secondary | ICD-10-CM

## 2014-12-24 DIAGNOSIS — Z5181 Encounter for therapeutic drug level monitoring: Secondary | ICD-10-CM | POA: Diagnosis not present

## 2014-12-24 DIAGNOSIS — I480 Paroxysmal atrial fibrillation: Secondary | ICD-10-CM

## 2014-12-24 DIAGNOSIS — Z7901 Long term (current) use of anticoagulants: Secondary | ICD-10-CM

## 2014-12-24 DIAGNOSIS — I6350 Cerebral infarction due to unspecified occlusion or stenosis of unspecified cerebral artery: Secondary | ICD-10-CM

## 2014-12-24 LAB — POCT INR: INR: 2.8

## 2015-01-10 ENCOUNTER — Other Ambulatory Visit: Payer: Self-pay | Admitting: Cardiology

## 2015-01-28 ENCOUNTER — Ambulatory Visit (INDEPENDENT_AMBULATORY_CARE_PROVIDER_SITE_OTHER): Payer: BLUE CROSS/BLUE SHIELD | Admitting: *Deleted

## 2015-01-28 DIAGNOSIS — Z7901 Long term (current) use of anticoagulants: Secondary | ICD-10-CM | POA: Diagnosis not present

## 2015-01-28 DIAGNOSIS — I639 Cerebral infarction, unspecified: Secondary | ICD-10-CM

## 2015-01-28 DIAGNOSIS — Z5181 Encounter for therapeutic drug level monitoring: Secondary | ICD-10-CM | POA: Diagnosis not present

## 2015-01-28 DIAGNOSIS — I4891 Unspecified atrial fibrillation: Secondary | ICD-10-CM

## 2015-01-28 DIAGNOSIS — I6350 Cerebral infarction due to unspecified occlusion or stenosis of unspecified cerebral artery: Secondary | ICD-10-CM

## 2015-01-28 LAB — POCT INR: INR: 2.4

## 2015-02-26 ENCOUNTER — Ambulatory Visit (INDEPENDENT_AMBULATORY_CARE_PROVIDER_SITE_OTHER): Payer: BLUE CROSS/BLUE SHIELD | Admitting: *Deleted

## 2015-02-26 DIAGNOSIS — I255 Ischemic cardiomyopathy: Secondary | ICD-10-CM | POA: Diagnosis not present

## 2015-02-26 NOTE — Progress Notes (Signed)
Remote ICD transmission.   

## 2015-03-01 ENCOUNTER — Other Ambulatory Visit: Payer: Self-pay | Admitting: Cardiology

## 2015-03-02 ENCOUNTER — Ambulatory Visit (INDEPENDENT_AMBULATORY_CARE_PROVIDER_SITE_OTHER): Payer: BLUE CROSS/BLUE SHIELD | Admitting: *Deleted

## 2015-03-02 ENCOUNTER — Telehealth: Payer: Self-pay | Admitting: Cardiology

## 2015-03-02 DIAGNOSIS — Z7901 Long term (current) use of anticoagulants: Secondary | ICD-10-CM

## 2015-03-02 DIAGNOSIS — I4891 Unspecified atrial fibrillation: Secondary | ICD-10-CM

## 2015-03-02 DIAGNOSIS — I639 Cerebral infarction, unspecified: Secondary | ICD-10-CM

## 2015-03-02 DIAGNOSIS — Z5181 Encounter for therapeutic drug level monitoring: Secondary | ICD-10-CM | POA: Diagnosis not present

## 2015-03-02 DIAGNOSIS — I6350 Cerebral infarction due to unspecified occlusion or stenosis of unspecified cerebral artery: Secondary | ICD-10-CM

## 2015-03-02 LAB — POCT INR: INR: 2.6

## 2015-03-02 MED ORDER — ROSUVASTATIN CALCIUM 20 MG PO TABS: 20.0000 mg | ORAL_TABLET | Freq: Every day | ORAL | Status: DC

## 2015-03-02 NOTE — Telephone Encounter (Signed)
Per [facility] crestor for patient with insurance coverage is $180.96 per month.

## 2015-03-02 NOTE — Telephone Encounter (Signed)
Patient informed to stop by the office and pick up a $3 for 3 month supply savings card. A new prescription was sent to the pharmacy for a 90 day supply. Patient said he would pick this up tomorrow.

## 2015-03-02 NOTE — Telephone Encounter (Signed)
patient was here to see Lattie Haw.  [employer] will not cover Crestor any longer he would like for someone to contact him in reference to getting RX and deciding if he should continue with Crestor or the generic they want him to take

## 2015-03-05 LAB — CUP PACEART REMOTE DEVICE CHECK
Battery Remaining Longevity: 29 mo
Brady Statistic RV Percent Paced: 1 %
Date Time Interrogation Session: 20160609062343
HighPow Impedance: 49 Ohm
Lead Channel Impedance Value: 640 Ohm
Lead Channel Pacing Threshold Amplitude: 1 V
Lead Channel Pacing Threshold Pulse Width: 0.5 ms
Lead Channel Setting Pacing Amplitude: 2.5 V
Lead Channel Setting Pacing Pulse Width: 0.5 ms
MDC IDC MSMT BATTERY VOLTAGE: 2.56 V
MDC IDC MSMT LEADCHNL RV SENSING INTR AMPL: 10.2 mV
MDC IDC PG SERIAL: 557946
MDC IDC SET LEADCHNL RV SENSING SENSITIVITY: 0.3 mV
Zone Setting Detection Interval: 250 ms
Zone Setting Detection Interval: 300 ms

## 2015-03-12 ENCOUNTER — Encounter: Payer: Self-pay | Admitting: Cardiology

## 2015-03-18 ENCOUNTER — Encounter: Payer: Self-pay | Admitting: Internal Medicine

## 2015-03-26 ENCOUNTER — Encounter: Payer: Self-pay | Admitting: Cardiology

## 2015-04-03 ENCOUNTER — Ambulatory Visit (INDEPENDENT_AMBULATORY_CARE_PROVIDER_SITE_OTHER): Payer: BLUE CROSS/BLUE SHIELD | Admitting: Cardiology

## 2015-04-03 ENCOUNTER — Encounter: Payer: Self-pay | Admitting: Cardiology

## 2015-04-03 VITALS — BP 98/64 | HR 66 | Ht 68.0 in | Wt 220.0 lb

## 2015-04-03 DIAGNOSIS — I480 Paroxysmal atrial fibrillation: Secondary | ICD-10-CM | POA: Diagnosis not present

## 2015-04-03 DIAGNOSIS — E782 Mixed hyperlipidemia: Secondary | ICD-10-CM | POA: Diagnosis not present

## 2015-04-03 DIAGNOSIS — I255 Ischemic cardiomyopathy: Secondary | ICD-10-CM | POA: Diagnosis not present

## 2015-04-03 DIAGNOSIS — I2510 Atherosclerotic heart disease of native coronary artery without angina pectoris: Secondary | ICD-10-CM | POA: Diagnosis not present

## 2015-04-03 NOTE — Patient Instructions (Signed)

## 2015-04-03 NOTE — Progress Notes (Signed)
Cardiology Office Note  Date: 04/03/2015   ID: [name], DOB 1951/08/25, MRN [id number]  PCP: Rory Percy, MD  Primary Cardiologist: Rozann Lesches, MD   Chief Complaint  Patient presents with  . Coronary Artery Disease  . Atrial Fibrillation  . Cardiomyopathy    History of Present Illness: [name] is a 64 y.o. male last seen in November 2015. He presents for a routine follow-up visit. From a cardiac perspective he remained stable, no reported angina symptoms or increasing shortness of breath typical activities. We reviewed his medications today. He has had some switches to generics with a change in insurance.  He continues to follow in the device clinic with [name]. No reported device shocks or syncope.  Continues on Coumadin with follow-up in the anticoagulation clinic. No reported spontaneous bleeding problems.  Cardiac catheterization in 2013 revealed patent stent sites in the LAD and first obtuse marginal. Follow-up ischemic assessment via [facility] from December 2015 is noted below.  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  Past Medical History  Diagnosis Date  . Coronary atherosclerosis of native coronary artery     BMS to LAD an OM1 - patent at catheterization 11/13   . Anterior myocardial infarction   . Chronic systolic heart failure   . Paroxysmal atrial fibrillation     On coumadin  . Stroke   . Pseudoaneurysm   . A-V fistula   . Inducible ventricular tachycardia   . Ischemic cardiomyopathy     LVEF 30%, status post ICD    Past Surgical History  Procedure Laterality Date  . Aicd implantation  2011    St Jude Current ICD    Current Outpatient Prescriptions  Medication Sig Dispense Refill  . aspirin 81 MG tablet Take 81 mg by mouth daily.      . bisoprolol (ZEBETA) 5 MG tablet TAKE 1 TABLET (5 MG TOTAL) BY MOUTH DAILY. 30 tablet 6  .  COUMADIN 5 MG tablet TAKE 1 TABLET DAILY EXCEPT 1/2 A TABLET ON MONDAYS AND FRIDAYS 45 tablet 6  . finasteride (PROSCAR) 5 MG tablet Take 5 mg by mouth Daily.     [name] losartan (COZAAR) 50 MG tablet TAKE 1 TABLET BY MOUTH EVERY DAY 30 tablet 6  . nitroGLYCERIN (NITROSTAT) 0.4 MG SL tablet Place 1 tablet (0.4 mg total) under the tongue every 5 (five) minutes as needed for chest pain. 90 tablet 3  . omega-3 acid ethyl esters (LOVAZA) 1 G capsule Take 1 g by mouth 2 (two) times daily.    . rosuvastatin (CRESTOR) 20 MG tablet Take 1 tablet (20 mg total) by mouth daily. 90 tablet 3  . spironolactone (ALDACTONE) 25 MG tablet TAKE 1 TABLET BY MOUTH EVERY DAY 30 tablet 6   No current facility-administered medications for this visit.    Allergies:  Review of patient's allergies indicates no known allergies.   Social History: The patient  reports that he has never smoked. He has never used smokeless tobacco. He reports that he does not drink alcohol or use illicit drugs.   ROS:  Please see the history of present illness. Otherwise, complete review of systems is positive for prostatism.  All other systems are reviewed and negative.   Physical Exam: VS:  BP 98/64 mmHg  Pulse 66  Ht 5\' 8"  (1.727 m)  Wt 220 lb (99.791 kg)  BMI 33.46 kg/m2  SpO2 95%, BMI Body mass index is 33.46 kg/(m^2).  Wt Readings from Last 3 Encounters:  04/03/15 220 lb (99.791 kg)  08/29/14 228 lb 12.8 oz (103.783 kg)  08/11/14 227 lb 1.9 oz (103.021 kg)     Appears comfortable at rest. HEENT: Conjunctiva and lids normal, oropharynx clear.  Neck: Supple, no elevated JVP or carotid bruits, no thyromegaly.  Lungs: Clear to auscultation, nonlabored breathing at rest.  Thorax: Stable device pocket site.  Cardiac: Regular rate and rhythm, no S3 or significant systolic murmur, no pericardial rub.  Abdomen: Soft, nontender, bowel sounds present, no guarding or rebound.  Extremities: No pitting edema, distal pulses 2+.   Skin: Warm and dry. Musculoskeletal: No kyphosis. Neuropsychiatric: Alert and oriented 3, affect appropriate.   ECG: ECG is not ordered today.  Other Studies Reviewed Today:  Lexiscan Cardiolite 08/22/2014: FINDINGS: Baseline tracing shows sinus bradycardia at 47 beats per min with evidence of old anterior wall infarct and nonspecific T-wave changes. Lexiscan bolus was given in standard fashion. Heart rate increased from 46 beats per min up to 75 beats per min, and blood pressure remained stable 117/63. Patient tolerated infusion well without chest pain. There were no diagnostic ST segment changes, and no arrhythmias were noted.  Analysis of the overall perfusion data finds adequate myocardial radiotracer uptake.  Perfusion: There is a large, dense, fixed apical/anteroseptal/ inferoapical defect is consistent with scar. No significant ischemia noted.  Wall Motion: Periapical akinesis.  Left Ventricular Ejection Fraction: 35 %  End diastolic volume 7048 ml  End systolic volume 889 ml  IMPRESSION: 1. Abnormal study. Evidence of extensive scar involving the periapical region, anteroapical and inferoapical walls. No significant ischemia.  2. Ischemic cardiomyopathy with periapical akinesis.  3. Left ventricular ejection fraction 35%  4. Intermediate-risk stress test findings*.  Assessment and Plan:  1. CAD status post BMS to the LAD and OM1 in 2013, no active ischemia by follow-up Cardiolite in December of last year. Continue medical therapy and observation. He has been on low-dose aspirin long-term and tolerated this with Coumadin, no changes were made.  2. Hyperlipidemia, on Crestor and omega-3 supplements, followed by Dr. Nadara Mustard.  3. Paroxysmal atrial fibrillation, no palpitations reported. He continues on Coumadin.  4. History of VT and ischemic cardiomyopathy, LVEF 35% by Cardiolite. St. Jude ICD in place, followed by [name]. We will update his  echocardiogram as well.  Current medicines were reviewed with the patient today.   Orders Placed This Encounter  Procedures  . ECHOCARDIOGRAM COMPLETE    Disposition: FU with me in 6 months.   Signed, Satira Sark, MD, [facility] 04/03/2015 8:29 AM    [facility] [place], [place], [place] [address] Phone: [phone]; Fax: [phone]

## 2015-04-07 ENCOUNTER — Ambulatory Visit (INDEPENDENT_AMBULATORY_CARE_PROVIDER_SITE_OTHER): Payer: BLUE CROSS/BLUE SHIELD | Admitting: Urology

## 2015-04-07 DIAGNOSIS — N400 Enlarged prostate without lower urinary tract symptoms: Secondary | ICD-10-CM

## 2015-04-07 DIAGNOSIS — R351 Nocturia: Secondary | ICD-10-CM | POA: Diagnosis not present

## 2015-04-13 ENCOUNTER — Ambulatory Visit (INDEPENDENT_AMBULATORY_CARE_PROVIDER_SITE_OTHER): Payer: BLUE CROSS/BLUE SHIELD | Admitting: *Deleted

## 2015-04-13 DIAGNOSIS — Z5181 Encounter for therapeutic drug level monitoring: Secondary | ICD-10-CM | POA: Diagnosis not present

## 2015-04-13 DIAGNOSIS — I6350 Cerebral infarction due to unspecified occlusion or stenosis of unspecified cerebral artery: Secondary | ICD-10-CM

## 2015-04-13 DIAGNOSIS — I639 Cerebral infarction, unspecified: Secondary | ICD-10-CM

## 2015-04-13 DIAGNOSIS — Z7901 Long term (current) use of anticoagulants: Secondary | ICD-10-CM | POA: Diagnosis not present

## 2015-04-13 DIAGNOSIS — I480 Paroxysmal atrial fibrillation: Secondary | ICD-10-CM

## 2015-04-13 DIAGNOSIS — I4891 Unspecified atrial fibrillation: Secondary | ICD-10-CM | POA: Diagnosis not present

## 2015-04-13 LAB — POCT INR: INR: 2.5

## 2015-05-07 ENCOUNTER — Other Ambulatory Visit: Payer: Self-pay

## 2015-05-07 ENCOUNTER — Ambulatory Visit (INDEPENDENT_AMBULATORY_CARE_PROVIDER_SITE_OTHER): Payer: BLUE CROSS/BLUE SHIELD

## 2015-05-07 DIAGNOSIS — I255 Ischemic cardiomyopathy: Secondary | ICD-10-CM | POA: Diagnosis not present

## 2015-05-13 ENCOUNTER — Telehealth: Payer: Self-pay | Admitting: *Deleted

## 2015-05-13 NOTE — Telephone Encounter (Signed)
-----   Message from Satira Sark, MD sent at 05/08/2015 10:47 AM EDT ----- Reviewed report. LVEF 25-30% with grade 1 diastolic dysfunction, overall relatively stable. Anterior and apical akinesis consistent with previous infarct, he is on Coumadin already for atrial fibrillation which should also reduce his risk of mural thrombus. No change in current cardiac regimen, keep regular follow-up.

## 2015-05-13 NOTE — Telephone Encounter (Signed)
Patient informed. 

## 2015-05-13 NOTE — Telephone Encounter (Signed)
Returned your call. You can try back around 1230

## 2015-05-27 ENCOUNTER — Ambulatory Visit (INDEPENDENT_AMBULATORY_CARE_PROVIDER_SITE_OTHER): Payer: BLUE CROSS/BLUE SHIELD | Admitting: *Deleted

## 2015-05-27 DIAGNOSIS — I639 Cerebral infarction, unspecified: Secondary | ICD-10-CM

## 2015-05-27 DIAGNOSIS — Z5181 Encounter for therapeutic drug level monitoring: Secondary | ICD-10-CM | POA: Diagnosis not present

## 2015-05-27 DIAGNOSIS — I4891 Unspecified atrial fibrillation: Secondary | ICD-10-CM | POA: Diagnosis not present

## 2015-05-27 DIAGNOSIS — Z7901 Long term (current) use of anticoagulants: Secondary | ICD-10-CM | POA: Diagnosis not present

## 2015-05-27 DIAGNOSIS — I6350 Cerebral infarction due to unspecified occlusion or stenosis of unspecified cerebral artery: Secondary | ICD-10-CM

## 2015-05-27 LAB — POCT INR: INR: 2.3

## 2015-06-01 ENCOUNTER — Ambulatory Visit (INDEPENDENT_AMBULATORY_CARE_PROVIDER_SITE_OTHER): Payer: BLUE CROSS/BLUE SHIELD | Admitting: *Deleted

## 2015-06-01 ENCOUNTER — Encounter: Payer: Self-pay | Admitting: Internal Medicine

## 2015-06-01 DIAGNOSIS — I255 Ischemic cardiomyopathy: Secondary | ICD-10-CM

## 2015-06-01 NOTE — Progress Notes (Signed)
Remote ICD transmission.   

## 2015-06-03 ENCOUNTER — Other Ambulatory Visit: Payer: Self-pay | Admitting: Cardiology

## 2015-06-08 LAB — CUP PACEART REMOTE DEVICE CHECK
Battery Voltage: 2.56 V
Brady Statistic RV Percent Paced: 1 %
HIGH POWER IMPEDANCE MEASURED VALUE: 49 Ohm
Lead Channel Impedance Value: 600 Ohm
Lead Channel Pacing Threshold Amplitude: 1 V
Lead Channel Sensing Intrinsic Amplitude: 10.7 mV
Lead Channel Setting Sensing Sensitivity: 0.3 mV
MDC IDC MSMT BATTERY REMAINING LONGEVITY: 29 mo
MDC IDC MSMT LEADCHNL RV PACING THRESHOLD PULSEWIDTH: 0.5 ms
MDC IDC SESS DTM: 20160912105255
MDC IDC SET LEADCHNL RV PACING AMPLITUDE: 2.5 V
MDC IDC SET LEADCHNL RV PACING PULSEWIDTH: 0.5 ms
MDC IDC SET ZONE DETECTION INTERVAL: 250 ms
Pulse Gen Serial Number: 557946
Zone Setting Detection Interval: 300 ms

## 2015-06-16 ENCOUNTER — Encounter: Payer: Self-pay | Admitting: Cardiology

## 2015-06-30 ENCOUNTER — Encounter: Payer: Self-pay | Admitting: Cardiology

## 2015-07-07 NOTE — Telephone Encounter (Signed)
-----   Message from Satira Sark, MD sent at 07/06/2015 10:01 AM EDT ----- Regarding: RE: The question per Brittney at Dr. Baruch Merl office is can the patient have the gum surgery and can the patient come off warfarin for the gum surgery? Okay thanks. Please have Lattie Haw take a look at the chart as I suspect he will need to have bridging. Otherwise he should be stable to have surgery and I can complete the form.  ----- Message -----    From: Merlene Laughter, LPN    Sent: [id number]/45/0388   9:59 AM      To: Malen Gauze, RN, Satira Sark, MD Subject: The question per Brittney at Dr. Baruch Merl offi#    ----- Message -----    From: Satira Sark, MD    Sent: 07/06/2015   9:46 AM      To: Malen Gauze, RN, Merlene Laughter, LPN  I got a request from patient's dentist about having gum surgery. The form indicates need to stop Coumadin with "?," so I am not certain whether this means that they can do the procedure on it or not.  Can you please clarify this with Dr. Wille Glaser Adam's office [phone])?  If he needs to stop Coumadin to have the procedure done, we will need to coordinate this through the anticoagulation clinic with Lattie Haw. I suspect he will need to have bridging given his history.

## 2015-07-13 ENCOUNTER — Ambulatory Visit (INDEPENDENT_AMBULATORY_CARE_PROVIDER_SITE_OTHER): Payer: BLUE CROSS/BLUE SHIELD | Admitting: *Deleted

## 2015-07-13 DIAGNOSIS — Z7901 Long term (current) use of anticoagulants: Secondary | ICD-10-CM | POA: Diagnosis not present

## 2015-07-13 DIAGNOSIS — I6350 Cerebral infarction due to unspecified occlusion or stenosis of unspecified cerebral artery: Secondary | ICD-10-CM | POA: Diagnosis not present

## 2015-07-13 DIAGNOSIS — Z23 Encounter for immunization: Secondary | ICD-10-CM | POA: Diagnosis not present

## 2015-07-13 DIAGNOSIS — I4891 Unspecified atrial fibrillation: Secondary | ICD-10-CM | POA: Diagnosis not present

## 2015-07-13 DIAGNOSIS — Z5181 Encounter for therapeutic drug level monitoring: Secondary | ICD-10-CM | POA: Diagnosis not present

## 2015-07-13 LAB — POCT INR: INR: 2.9

## 2015-08-04 ENCOUNTER — Other Ambulatory Visit: Payer: Self-pay | Admitting: Cardiology

## 2015-08-21 ENCOUNTER — Encounter: Payer: Self-pay | Admitting: Internal Medicine

## 2015-08-21 ENCOUNTER — Ambulatory Visit (INDEPENDENT_AMBULATORY_CARE_PROVIDER_SITE_OTHER): Payer: BLUE CROSS/BLUE SHIELD | Admitting: Internal Medicine

## 2015-08-21 VITALS — BP 100/70 | HR 59 | Ht 73.0 in | Wt 230.0 lb

## 2015-08-21 DIAGNOSIS — I5022 Chronic systolic (congestive) heart failure: Secondary | ICD-10-CM | POA: Diagnosis not present

## 2015-08-21 DIAGNOSIS — Z5181 Encounter for therapeutic drug level monitoring: Secondary | ICD-10-CM

## 2015-08-21 DIAGNOSIS — I255 Ischemic cardiomyopathy: Secondary | ICD-10-CM | POA: Diagnosis not present

## 2015-08-21 DIAGNOSIS — E782 Mixed hyperlipidemia: Secondary | ICD-10-CM | POA: Diagnosis not present

## 2015-08-21 DIAGNOSIS — I480 Paroxysmal atrial fibrillation: Secondary | ICD-10-CM

## 2015-08-21 DIAGNOSIS — Z95810 Presence of automatic (implantable) cardiac defibrillator: Secondary | ICD-10-CM

## 2015-08-21 MED ORDER — NITROGLYCERIN 0.4 MG SL SUBL: 0.4000 mg | SUBLINGUAL_TABLET | SUBLINGUAL | Status: DC | PRN

## 2015-08-21 NOTE — Progress Notes (Signed)
PCP: Rory Percy, MD Primary Cardiologist: Dr Domenic Polite  The patient presents today for routine electrophysiology followup.  Since last being seen in our clinic, the patient reports doing very well.   He has rare palpitations.  SOB is minimal.  He has retired and is not exercising like he should.  He is watching his grandchildren quite a bit.   Today, he denies symptoms of chest pain, orthopnea, PND, lower extremity edema, presyncope, syncope, or neurologic sequela.  The patient feels that he is tolerating medications without difficulties and is otherwise without complaint today.   Past Medical History  Diagnosis Date  . Coronary atherosclerosis of native coronary artery     BMS to LAD an OM1 - patent at catheterization 11/13   . Anterior myocardial infarction ([place])   . Chronic systolic heart failure ([place])   . Paroxysmal atrial fibrillation ([facility])     On coumadin  . Stroke ([place])   . Pseudoaneurysm ([place])   . A-V fistula ([place])   . Inducible ventricular tachycardia ([place])   . Ischemic cardiomyopathy     LVEF 30%, status post ICD   Past Surgical History  Procedure Laterality Date  . Aicd implantation  2011    St Jude Current ICD    Current Outpatient Prescriptions  Medication Sig Dispense Refill  . aspirin 81 MG tablet Take 81 mg by mouth daily.      . bisoprolol (ZEBETA) 5 MG tablet TAKE 1 TABLET (5 MG TOTAL) BY MOUTH DAILY. 30 tablet 6  . COUMADIN 5 MG tablet TAKE 1 TABLET DAILY EXCEPT 1/2 A TABLET ON MONDAYS AND FRIDAYS 45 tablet 6  . finasteride (PROSCAR) 5 MG tablet Take 5 mg by mouth Daily.     [name] losartan (COZAAR) 50 MG tablet TAKE 1 TABLET BY MOUTH EVERY DAY 30 tablet 6  . nitroGLYCERIN (NITROSTAT) 0.4 MG SL tablet Place 1 tablet (0.4 mg total) under the tongue every 5 (five) minutes as needed for chest pain. 25 tablet 3  . omega-3 acid ethyl esters (LOVAZA) 1 G capsule Take 1 g by mouth 2 (two) times daily.    . rosuvastatin (CRESTOR) 20 MG tablet Take 1 tablet (20 mg total) by  mouth daily. 90 tablet 3  . spironolactone (ALDACTONE) 25 MG tablet TAKE 1 TABLET BY MOUTH EVERY DAY 30 tablet 6   No current facility-administered medications for this visit.    No Known Allergies  Social History   Social History  . Marital Status: Married    Spouse Name: N/A  . Number of Children: N/A  . Years of Education: N/A   Occupational History  . Not on file.   Social History Main Topics  . Smoking status: Never Smoker   . Smokeless tobacco: Never Used  . Alcohol Use: No  . Drug Use: No  . Sexual Activity: Not on file   Other Topics Concern  . Not on file   Social History Narrative    Physical Exam: Filed Vitals:   08/21/15 0802  BP: 100/70  Pulse: 59  Height: 6\' 1"  (1.854 m)  Weight: 230 lb (104.327 kg)  SpO2: 94%    GEN- The patient is well appearing, alert and oriented x 3 today.   Head- normocephalic, atraumatic Eyes-  Sclera clear, conjunctiva pink Ears- hearing intact Oropharynx- clear Neck- supple, no JVP Lymph- no cervical lymphadenopathy Lungs- Clear to ausculation bilaterally, normal work of breathing Chest- ICD pocket is well healed Heart- Regular rate and rhythm, no murmurs, rubs or  gallops, PMI not laterally displaced GI- soft, NT, ND, + BS Extremities- no clubbing, cyanosis, or edema  ICD interrogation- reviewed in detail today,  See PACEART report  Assessment and Plan:  1. CAD/ ischemic CM/ chronic systolic dysfunction Normal ICD function See Pace Art report No changes today  2. afib Continue coumadin We discussed NOAC therapy today.  He is clear that he would prefer to stay on coumadin  3. Overweight Lifestyle modification is encouraged  4. HL Recently switched (3 months ago) from crestor to [employer] due to insurance preferences.  Is very worried about his lipids with this change. I will check lfts and lipids today.  He can follow-up with Dr Domenic Polite when he sees him in January on the results.  Merlin Return in 1  year

## 2015-08-21 NOTE — Patient Instructions (Addendum)
Your physician recommends that you continue on your current medications as directed. Please refer to the Current Medication list given to you today. Next device check 11/23/15. Your physician recommends that you return for a FASTING lipid/liver profile next week at [facility] recommends that you schedule a follow-up appointment in: 1 year with [name]. You can schedule this appointment today or you can wait for your letter to come in the mail in about   10 months reminding you to call and schedule this appointment. If you do not receive this letter, please contact our office for your appointment.

## 2015-08-24 ENCOUNTER — Ambulatory Visit (INDEPENDENT_AMBULATORY_CARE_PROVIDER_SITE_OTHER): Payer: BLUE CROSS/BLUE SHIELD | Admitting: *Deleted

## 2015-08-24 DIAGNOSIS — I4891 Unspecified atrial fibrillation: Secondary | ICD-10-CM

## 2015-08-24 DIAGNOSIS — Z5181 Encounter for therapeutic drug level monitoring: Secondary | ICD-10-CM

## 2015-08-24 DIAGNOSIS — I6350 Cerebral infarction due to unspecified occlusion or stenosis of unspecified cerebral artery: Secondary | ICD-10-CM

## 2015-08-24 DIAGNOSIS — Z7901 Long term (current) use of anticoagulants: Secondary | ICD-10-CM

## 2015-08-24 DIAGNOSIS — I480 Paroxysmal atrial fibrillation: Secondary | ICD-10-CM

## 2015-08-24 LAB — POCT INR: INR: 2.4

## 2015-08-25 LAB — CUP PACEART INCLINIC DEVICE CHECK
Battery Remaining Longevity: 18
Battery Voltage: 2.54 V
Brady Statistic RV Percent Paced: 0.83 %
Date Time Interrogation Session: 20161202130800
HighPow Impedance: 50.625
Implantable Lead Location: 753860
Implantable Lead Model: 148
Implantable Lead Serial Number: 116740
Lead Channel Impedance Value: 662.5 Ohm
Lead Channel Pacing Threshold Pulse Width: 0.5 ms
Lead Channel Sensing Intrinsic Amplitude: 11.7 mV
Lead Channel Setting Sensing Sensitivity: 0.3 mV
MDC IDC LEAD IMPLANT DT: 20020111
MDC IDC MSMT LEADCHNL RV PACING THRESHOLD AMPLITUDE: 0.5 V
MDC IDC SET LEADCHNL RV PACING AMPLITUDE: 2.5 V
MDC IDC SET LEADCHNL RV PACING PULSEWIDTH: 0.5 ms
Pulse Gen Serial Number: 557946

## 2015-08-28 ENCOUNTER — Telehealth: Payer: Self-pay | Admitting: *Deleted

## 2015-08-28 NOTE — Telephone Encounter (Signed)
Patient informed. 

## 2015-08-28 NOTE — Telephone Encounter (Signed)
-----   Message from Satira Sark, MD sent at 08/28/2015  7:46 AM EST ----- Reviewed. LFTs are normal. Lipid numbers are well controlled, total cholesterol 139 and LDL 66.

## 2015-09-25 ENCOUNTER — Ambulatory Visit (INDEPENDENT_AMBULATORY_CARE_PROVIDER_SITE_OTHER): Payer: BLUE CROSS/BLUE SHIELD | Admitting: Cardiology

## 2015-09-25 ENCOUNTER — Encounter: Payer: Self-pay | Admitting: Cardiology

## 2015-09-25 VITALS — BP 102/64 | HR 66 | Ht 73.0 in | Wt 232.2 lb

## 2015-09-25 DIAGNOSIS — Z95810 Presence of automatic (implantable) cardiac defibrillator: Secondary | ICD-10-CM

## 2015-09-25 DIAGNOSIS — E782 Mixed hyperlipidemia: Secondary | ICD-10-CM

## 2015-09-25 DIAGNOSIS — I255 Ischemic cardiomyopathy: Secondary | ICD-10-CM | POA: Diagnosis not present

## 2015-09-25 DIAGNOSIS — I480 Paroxysmal atrial fibrillation: Secondary | ICD-10-CM | POA: Diagnosis not present

## 2015-09-25 DIAGNOSIS — I4891 Unspecified atrial fibrillation: Secondary | ICD-10-CM

## 2015-09-25 NOTE — Patient Instructions (Signed)
Your physician wants you to follow-up in: 6 months with Dr. McDowell You will receive a reminder letter in the mail two months in advance. If you don't receive a letter, please call our office to schedule the follow-up appointment.  Your physician recommends that you continue on your current medications as directed. Please refer to the Current Medication list given to you today.  Thank you for choosing [facility]!!    

## 2015-09-25 NOTE — Progress Notes (Signed)
Cardiology Office Note  Date: 09/25/2015   ID: [name], DOB 1950-11-29, MRN [id number]  PCP: Rory Percy, MD  Primary Cardiologist: Rozann Lesches, MD   Chief Complaint  Patient presents with  . Coronary Artery Disease  . Cardiomyopathy  . Atrial Fibrillation    History of Present Illness: [name] is a 65 y.o. male last seen in July 2016. He presents for a routine follow-up visit. He continues to do well without any significant angina symptoms or palpitations. He reports NYHA class II dyspnea with typical activities. 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  Visit with [name] noted in December 2016. At that time he was noted to have normal ICD function.  We discussed his cardiac medications which are stable and outlined below. He also had interval lipid panel which showed good control, we discussed the results today.  ECG shows sinus rhythm with low voltage, old anteroseptal infarct pattern, nonspecific ST-T changes.  Past Medical History  Diagnosis Date  . Coronary atherosclerosis of native coronary artery     BMS to LAD an OM1 - patent at catheterization 11/13   . Anterior myocardial infarction ([place])   . Chronic systolic heart failure ([place])   . Paroxysmal atrial fibrillation ([facility])     On coumadin  . Stroke ([place])   . Pseudoaneurysm ([place])   . A-V fistula ([place])   . Inducible ventricular tachycardia ([place])   . Ischemic cardiomyopathy     LVEF 30%, status post ICD    Past Surgical History  Procedure Laterality Date  . Aicd implantation  2011    St Jude Current ICD    Current Outpatient Prescriptions  Medication Sig Dispense Refill  . aspirin 81 MG tablet Take 81 mg by mouth daily.      . bisoprolol (ZEBETA) 5 MG tablet TAKE 1 TABLET (5 MG TOTAL) BY MOUTH DAILY. 30 tablet 6  . COUMADIN 5 MG tablet TAKE 1  TABLET DAILY EXCEPT 1/2 A TABLET ON MONDAYS AND FRIDAYS 45 tablet 6  . finasteride (PROSCAR) 5 MG tablet Take 5 mg by mouth Daily.     [name] losartan (COZAAR) 50 MG tablet TAKE 1 TABLET BY MOUTH EVERY DAY 30 tablet 6  . nitroGLYCERIN (NITROSTAT) 0.4 MG SL tablet Place 1 tablet (0.4 mg total) under the tongue every 5 (five) minutes as needed for chest pain. 25 tablet 3  . omega-3 acid ethyl esters (LOVAZA) 1 G capsule Take 1 g by mouth 2 (two) times daily.    . rosuvastatin (CRESTOR) 20 MG tablet Take 1 tablet (20 mg total) by mouth daily. 90 tablet 3  . spironolactone (ALDACTONE) 25 MG tablet TAKE 1 TABLET BY MOUTH EVERY DAY 30 tablet 6   No current facility-administered medications for this visit.   Allergies:  Review of patient's allergies indicates no known allergies.   Social History: The patient  reports that he has never smoked. He has never used smokeless tobacco. He reports that he does not drink alcohol or use illicit drugs.   ROS:  Please see the history of present illness. Otherwise, complete review of systems is positive for none.  All other systems are reviewed and negative.   Physical Exam: VS:  BP 102/64 mmHg  Pulse 66  Ht 6\' 1"  (1.854 m)  Wt 232 lb 3.2 oz (105.325 kg)  BMI  30.64 kg/m2  SpO2 95%, BMI Body mass index is 30.64 kg/(m^2).  Wt Readings from Last 3 Encounters:  09/25/15 232 lb 3.2 oz (105.325 kg)  08/21/15 230 lb (104.327 kg)  04/03/15 220 lb (99.791 kg)    Appears comfortable at rest.  HEENT: Conjunctiva and lids normal, oropharynx clear.  Neck: Supple, no elevated JVP or carotid bruits, no thyromegaly.  Lungs: Clear to auscultation, nonlabored breathing at rest.  Thorax: Stable device pocket site.  Cardiac: Regular rate and rhythm, no S3 or significant systolic murmur, no pericardial rub.  Abdomen: Soft, nontender, bowel sounds present, no guarding or rebound.  Extremities: No pitting edema, distal pulses 2+.   ECG: ECG is ordered today.  Recent  Labwork:  December 2016: AST 18, ALT 18, cholesterol 139, triglycerides 101, HDL 53, and LDL 66  Other Studies Reviewed Today:  Lexiscan Cardiolite 08/22/2014: FINDINGS: Baseline tracing shows sinus bradycardia at 47 beats per min with evidence of old anterior wall infarct and nonspecific T-wave changes. Lexiscan bolus was given in standard fashion. Heart rate increased from 46 beats per min up to 75 beats per min, and blood pressure remained stable 117/63. Patient tolerated infusion well without chest pain. There were no diagnostic ST segment changes, and no arrhythmias were noted.  Analysis of the overall perfusion data finds adequate myocardial radiotracer uptake.  Perfusion: There is a large, dense, fixed apical/anteroseptal/ inferoapical defect is consistent with scar. No significant ischemia noted.  Wall Motion: Periapical akinesis.  Left Ventricular Ejection Fraction: 35 %  End diastolic volume 123456 ml  End systolic volume Q000111Q ml  IMPRESSION: 1. Abnormal study. Evidence of extensive scar involving the periapical region, anteroapical and inferoapical walls. No significant ischemia.  2. Ischemic cardiomyopathy with periapical akinesis.  3. Left ventricular ejection fraction 35%  4. Intermediate-risk stress test findings*.  Assessment and Plan:  1. Ischemic cardiomyopathy with LVEF approximately 35%. He is clinically stable without active angina symptoms and on good medical therapy. We'll continue observation for now.  2. St. Jude ICD in place, followed by [name]. No device shocks or syncope.  3. Paroxysmal atrial fibrillation. He continues on Coumadin for stroke prophylaxis with follow-up in the anticoagulation clinic.  4. Hyperlipidemia, on Crestor. Recent lab work reviewed.  Current medicines were reviewed with the patient today.   Orders Placed This Encounter  Procedures  . EKG 12-Lead    Disposition: FU with me in 6  months.   Signed, Satira Sark, MD, [facility] 09/25/2015 4:27 PM    [facility] [place], [place], [place] [address] Phone: [phone]; Fax: [phone]

## 2015-10-05 ENCOUNTER — Encounter: Payer: Self-pay | Admitting: *Deleted

## 2015-10-14 ENCOUNTER — Ambulatory Visit (INDEPENDENT_AMBULATORY_CARE_PROVIDER_SITE_OTHER): Payer: BLUE CROSS/BLUE SHIELD | Admitting: *Deleted

## 2015-10-14 DIAGNOSIS — Z7901 Long term (current) use of anticoagulants: Secondary | ICD-10-CM | POA: Diagnosis not present

## 2015-10-14 DIAGNOSIS — I6350 Cerebral infarction due to unspecified occlusion or stenosis of unspecified cerebral artery: Secondary | ICD-10-CM | POA: Diagnosis not present

## 2015-10-14 DIAGNOSIS — Z5181 Encounter for therapeutic drug level monitoring: Secondary | ICD-10-CM | POA: Diagnosis not present

## 2015-10-14 DIAGNOSIS — I4891 Unspecified atrial fibrillation: Secondary | ICD-10-CM

## 2015-10-14 LAB — POCT INR: INR: 2.9

## 2015-11-23 ENCOUNTER — Ambulatory Visit (INDEPENDENT_AMBULATORY_CARE_PROVIDER_SITE_OTHER): Payer: BLUE CROSS/BLUE SHIELD | Admitting: *Deleted

## 2015-11-23 ENCOUNTER — Other Ambulatory Visit: Payer: Self-pay | Admitting: Cardiology

## 2015-11-23 DIAGNOSIS — Z95810 Presence of automatic (implantable) cardiac defibrillator: Secondary | ICD-10-CM

## 2015-11-23 DIAGNOSIS — I255 Ischemic cardiomyopathy: Secondary | ICD-10-CM | POA: Diagnosis not present

## 2015-11-25 ENCOUNTER — Ambulatory Visit (INDEPENDENT_AMBULATORY_CARE_PROVIDER_SITE_OTHER): Payer: BLUE CROSS/BLUE SHIELD | Admitting: Pharmacist

## 2015-11-25 DIAGNOSIS — Z5181 Encounter for therapeutic drug level monitoring: Secondary | ICD-10-CM

## 2015-11-25 DIAGNOSIS — Z7901 Long term (current) use of anticoagulants: Secondary | ICD-10-CM | POA: Diagnosis not present

## 2015-11-25 DIAGNOSIS — I6350 Cerebral infarction due to unspecified occlusion or stenosis of unspecified cerebral artery: Secondary | ICD-10-CM

## 2015-11-25 DIAGNOSIS — I4891 Unspecified atrial fibrillation: Secondary | ICD-10-CM | POA: Diagnosis not present

## 2015-11-25 LAB — POCT INR: INR: 2.7

## 2015-11-27 NOTE — Progress Notes (Signed)
Remote ICD transmission.   

## 2015-11-28 LAB — CUP PACEART REMOTE DEVICE CHECK
Battery Remaining Longevity: 18 mo
Battery Voltage: 2.54 V
Brady Statistic RV Percent Paced: 1 %
Date Time Interrogation Session: 20170306075556
HIGH POWER IMPEDANCE MEASURED VALUE: 50 Ohm
Implantable Lead Serial Number: 116740
Lead Channel Impedance Value: 610 Ohm
Lead Channel Setting Pacing Amplitude: 2.5 V
Lead Channel Setting Pacing Pulse Width: 0.5 ms
Lead Channel Setting Sensing Sensitivity: 0.3 mV
MDC IDC LEAD IMPLANT DT: 20020111
MDC IDC LEAD LOCATION: 753860
MDC IDC MSMT LEADCHNL RV PACING THRESHOLD AMPLITUDE: 0.5 V
MDC IDC MSMT LEADCHNL RV PACING THRESHOLD PULSEWIDTH: 0.5 ms
MDC IDC MSMT LEADCHNL RV SENSING INTR AMPL: 11 mV
MDC IDC PG SERIAL: 557946

## 2015-11-28 NOTE — Progress Notes (Signed)
Normal remote reviewed.  Next Merlin 02/22/16 

## 2015-12-02 ENCOUNTER — Encounter: Payer: Self-pay | Admitting: Cardiology

## 2015-12-09 ENCOUNTER — Other Ambulatory Visit: Payer: Self-pay | Admitting: Cardiology

## 2015-12-16 ENCOUNTER — Encounter: Payer: Self-pay | Admitting: Cardiology

## 2016-01-06 ENCOUNTER — Ambulatory Visit (INDEPENDENT_AMBULATORY_CARE_PROVIDER_SITE_OTHER): Payer: BLUE CROSS/BLUE SHIELD | Admitting: *Deleted

## 2016-01-06 DIAGNOSIS — I6350 Cerebral infarction due to unspecified occlusion or stenosis of unspecified cerebral artery: Secondary | ICD-10-CM

## 2016-01-06 DIAGNOSIS — Z5181 Encounter for therapeutic drug level monitoring: Secondary | ICD-10-CM

## 2016-01-06 DIAGNOSIS — Z7901 Long term (current) use of anticoagulants: Secondary | ICD-10-CM

## 2016-01-06 DIAGNOSIS — I4891 Unspecified atrial fibrillation: Secondary | ICD-10-CM | POA: Diagnosis not present

## 2016-01-06 LAB — POCT INR: INR: 3.1

## 2016-01-15 ENCOUNTER — Other Ambulatory Visit: Payer: Self-pay | Admitting: *Deleted

## 2016-01-15 MED ORDER — BISOPROLOL FUMARATE 5 MG PO TABS: ORAL_TABLET | ORAL | Status: DC

## 2016-01-27 ENCOUNTER — Ambulatory Visit (INDEPENDENT_AMBULATORY_CARE_PROVIDER_SITE_OTHER): Payer: BLUE CROSS/BLUE SHIELD | Admitting: *Deleted

## 2016-01-27 DIAGNOSIS — Z5181 Encounter for therapeutic drug level monitoring: Secondary | ICD-10-CM

## 2016-01-27 DIAGNOSIS — I6350 Cerebral infarction due to unspecified occlusion or stenosis of unspecified cerebral artery: Secondary | ICD-10-CM | POA: Diagnosis not present

## 2016-01-27 DIAGNOSIS — Z7901 Long term (current) use of anticoagulants: Secondary | ICD-10-CM | POA: Diagnosis not present

## 2016-01-27 DIAGNOSIS — I4891 Unspecified atrial fibrillation: Secondary | ICD-10-CM | POA: Diagnosis not present

## 2016-01-27 LAB — POCT INR: INR: 2.9

## 2016-02-22 ENCOUNTER — Telehealth: Payer: Self-pay | Admitting: Cardiology

## 2016-02-22 ENCOUNTER — Ambulatory Visit (INDEPENDENT_AMBULATORY_CARE_PROVIDER_SITE_OTHER): Payer: BLUE CROSS/BLUE SHIELD | Admitting: *Deleted

## 2016-02-22 DIAGNOSIS — Z95810 Presence of automatic (implantable) cardiac defibrillator: Secondary | ICD-10-CM

## 2016-02-22 DIAGNOSIS — I4891 Unspecified atrial fibrillation: Secondary | ICD-10-CM

## 2016-02-22 LAB — CUP PACEART REMOTE DEVICE CHECK
Battery Voltage: 2.54 V
HighPow Impedance: 46 Ohm
Implantable Lead Implant Date: 20020111
Implantable Lead Location: 753860
Lead Channel Impedance Value: 600 Ohm
Lead Channel Pacing Threshold Amplitude: 0.5 V
Lead Channel Pacing Threshold Pulse Width: 0.5 ms
Lead Channel Sensing Intrinsic Amplitude: 10.7 mV
Lead Channel Setting Pacing Amplitude: 2.5 V
MDC IDC LEAD SERIAL: 116740
MDC IDC MSMT BATTERY REMAINING LONGEVITY: 18 mo
MDC IDC SESS DTM: 20170605222531
MDC IDC SET LEADCHNL RV PACING PULSEWIDTH: 0.5 ms
MDC IDC SET LEADCHNL RV SENSING SENSITIVITY: 0.3 mV
MDC IDC STAT BRADY RV PERCENT PACED: 1 %
Pulse Gen Serial Number: 557946

## 2016-02-22 NOTE — Telephone Encounter (Signed)
LMOVM reminding pt to send remote transmission.   

## 2016-02-23 NOTE — Progress Notes (Signed)
Remote ICD transmission.   

## 2016-02-24 ENCOUNTER — Ambulatory Visit (INDEPENDENT_AMBULATORY_CARE_PROVIDER_SITE_OTHER): Payer: BLUE CROSS/BLUE SHIELD | Admitting: *Deleted

## 2016-02-24 DIAGNOSIS — Z5181 Encounter for therapeutic drug level monitoring: Secondary | ICD-10-CM | POA: Diagnosis not present

## 2016-02-24 DIAGNOSIS — I6350 Cerebral infarction due to unspecified occlusion or stenosis of unspecified cerebral artery: Secondary | ICD-10-CM

## 2016-02-24 DIAGNOSIS — I4891 Unspecified atrial fibrillation: Secondary | ICD-10-CM

## 2016-02-24 DIAGNOSIS — Z7901 Long term (current) use of anticoagulants: Secondary | ICD-10-CM

## 2016-02-24 LAB — POCT INR: INR: 2.2

## 2016-03-09 ENCOUNTER — Encounter: Payer: Self-pay | Admitting: Cardiology

## 2016-03-24 ENCOUNTER — Other Ambulatory Visit: Payer: Self-pay | Admitting: Cardiology

## 2016-03-24 ENCOUNTER — Encounter: Payer: Self-pay | Admitting: Cardiology

## 2016-03-25 ENCOUNTER — Ambulatory Visit: Payer: BLUE CROSS/BLUE SHIELD | Admitting: Cardiology

## 2016-04-06 ENCOUNTER — Ambulatory Visit (INDEPENDENT_AMBULATORY_CARE_PROVIDER_SITE_OTHER): Payer: BLUE CROSS/BLUE SHIELD | Admitting: *Deleted

## 2016-04-06 DIAGNOSIS — Z7901 Long term (current) use of anticoagulants: Secondary | ICD-10-CM | POA: Diagnosis not present

## 2016-04-06 DIAGNOSIS — I6350 Cerebral infarction due to unspecified occlusion or stenosis of unspecified cerebral artery: Secondary | ICD-10-CM

## 2016-04-06 DIAGNOSIS — I4891 Unspecified atrial fibrillation: Secondary | ICD-10-CM

## 2016-04-06 DIAGNOSIS — Z5181 Encounter for therapeutic drug level monitoring: Secondary | ICD-10-CM | POA: Diagnosis not present

## 2016-04-06 LAB — POCT INR: INR: 2.3

## 2016-04-12 ENCOUNTER — Ambulatory Visit (INDEPENDENT_AMBULATORY_CARE_PROVIDER_SITE_OTHER): Payer: BLUE CROSS/BLUE SHIELD | Admitting: Urology

## 2016-04-12 DIAGNOSIS — N401 Enlarged prostate with lower urinary tract symptoms: Secondary | ICD-10-CM

## 2016-04-14 NOTE — Progress Notes (Signed)
Cardiology Office Note  Date: 04/15/2016   ID: [name], DOB Mar 01, 1951, MRN [id number]  PCP: Marcus Percy, MD  Primary Cardiologist: Marcus Lesches, MD   Chief Complaint  Patient presents with  . Coronary Artery Disease  . Cardiomyopathy    History of Present Illness: [name] is a 65 y.o. male last seen in January. He presents for a routine follow-up visit. 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 He has been spending a lot of time with grandchildren however.  He continues on Coumadin with follow-up in the anticoagulation clinic. No bleeding problems. States that he may be in need of gum surgery sometime in the fall, has been bridged with Lovenox in the past when off Coumadin with prior history of stroke and also cardiomyopathy.  He has a St. Jude ICD in place and follows with Dr. Rayann Smith. Most recent device check showed normal function.  He reports NYHA class III dyspnea if he rushes. He has not had any angina symptoms. Seems like it might be worse in the last few months by his recollection. Reports compliance with his current medications. We did discuss and Entresto today, although with his low blood pressure at baseline, I am not certain how well he would tolerate this.  Past Medical History:  Diagnosis Date  . A-V fistula ([place])   . Anterior myocardial infarction ([place])   . Chronic systolic heart failure ([place])   . Coronary atherosclerosis of native coronary artery    BMS to LAD an OM1 - patent at catheterization 11/13   . 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 Ischemic cardiomyopathy    LVEF 30%, status post ICD  . Paroxysmal atrial fibrillation ([facility])    On coumadin  . Pseudoaneurysm ([place])   . Stroke [facility])     Past Surgical History:  Procedure Laterality Date  . AICD implantation   2011   St Jude Current ICD    Current Outpatient Prescriptions  Medication Sig Dispense Refill  . aspirin 81 MG tablet Take 81 mg by mouth daily.      . bisoprolol (ZEBETA) 5 MG tablet TAKE 1 TABLET (5 MG TOTAL) BY MOUTH DAILY. 90 tablet 3  . finasteride (PROSCAR) 5 MG tablet Take 5 mg by mouth Daily.     [name] losartan (COZAAR) 50 MG tablet TAKE 1 TABLET BY MOUTH EVERY DAY 30 tablet 6  . nitroGLYCERIN (NITROSTAT) 0.4 MG SL tablet Place 1 tablet (0.4 mg total) under the tongue every 5 (five) minutes as needed for chest pain. 25 tablet 3  . omega-3 acid ethyl esters (LOVAZA) 1 G capsule Take 1 g by mouth 2 (two) times daily.    . rosuvastatin (CRESTOR) 20 MG tablet TAKE 1 TABLET (20 MG TOTAL) BY MOUTH DAILY. 90 tablet 3  . spironolactone (ALDACTONE) 25 MG tablet TAKE 1 TABLET BY MOUTH EVERY DAY 30 tablet 6  . warfarin (COUMADIN) 5 MG tablet TAKE 1 TABLET DAILY EXCEPT 1/2 A TABLET ON MONDAYS AND FRIDAYS 45 tablet 2   No current facility-administered medications for this visit.    Allergies:  Review of patient's allergies indicates no known allergies.   Social History: The patient  reports that he has never smoked. He has never used smokeless tobacco. He reports that he does not drink alcohol or use drugs.  ROS:  Please see the history of present illness. Otherwise, complete review of systems is positive for dyspnea on exertion.  All other systems are reviewed and negative.   Physical Exam: VS:  BP 95/65   Pulse 74   Ht 6\' 1"  (1.854 m)   Wt 230 lb 3.2 oz (104.4 kg)   SpO2 95%   BMI 30.37 kg/m , BMI Body mass index is 30.37 kg/m.  Wt Readings from Last 3 Encounters:  04/15/16 230 lb 3.2 oz (104.4 kg)  09/25/15 232 lb 3.2 oz (105.3 kg)  08/21/15 230 lb (104.3 kg)    Appears comfortable at rest.  HEENT: Conjunctiva and lids normal, oropharynx clear.  Neck: Supple, no elevated JVP or carotid bruits, no thyromegaly.  Lungs: Clear to auscultation, nonlabored breathing at rest.  Thorax:  Stable device pocket site.  Cardiac: Irregularly irregular, no S3 or significant systolic murmur, no pericardial rub.  Abdomen: Soft, nontender, bowel sounds present, no guarding or rebound.  Extremities: No pitting edema, distal pulses 2+.  Skin: Warm and dry.  Musculoskeletal: No kyphosis. Neuropsychiatric: Alert and oriented 3, affect appropriate.  ECG: I personally reviewed the tracing from 09/25/2015 which showed normal sinus rhythm with left anterior fascicular block and old anterior infarct pattern.  Recent Labwork:  December 2016: AST 19, ALT 18, cholesterol 139, triglycerides 101, HDL 53, LDL 66  Other Studies Reviewed Today:  Lexiscan Cardiolite 08/22/2014: FINDINGS: Baseline tracing shows sinus bradycardia at 47 beats per min with evidence of old anterior wall infarct and nonspecific T-wave changes. Lexiscan bolus was given in standard fashion. Heart rate increased from 46 beats per min up to 75 beats per min, and blood pressure remained stable 117/63. Patient tolerated infusion well without chest pain. There were no diagnostic ST segment changes, and no arrhythmias were noted.  Analysis of the overall perfusion data finds adequate myocardial radiotracer uptake.  Perfusion: There is a large, dense, fixed apical/anteroseptal/ inferoapical defect is consistent with scar. No significant ischemia noted.  Wall Motion: Periapical akinesis.  Left Ventricular Ejection Fraction: 35 %  End diastolic volume 123456 ml  End systolic volume [id number] ml  IMPRESSION: 1. Abnormal study. Evidence of extensive scar involving the periapical region, anteroapical and inferoapical walls. No significant ischemia.  2. Ischemic cardiomyopathy with periapical akinesis.  3. Left ventricular ejection fraction 35%  4. Intermediate-risk stress test findings*.  Echocardiogram 05/07/2015: Study Conclusions  - Left ventricle: The cavity size was normal. There was mild   concentric  hypertrophy. Systolic function was severely reduced.   The estimated ejection fraction was in the range of 25% to 30%.   Doppler parameters are consistent with abnormal left ventricular   relaxation (grade 1 diastolic dysfunction). - Regional wall motion abnormality: Akinesis of the mid anterior,   mid anteroseptal, apical lateral, and apical myocardium; severe   hypokinesis of the apical anterior, apical inferior, and apical   septal myocardium; hypokinesis of the mid inferolateral   myocardium; moderate hypokinesis of the mid anterolateral   myocardium. - Aortic valve: Mildly to moderately calcified annulus. Trileaflet;   mildly thickened, mildly calcified leaflets. - Aorta: Mild aortic root dilatation. Aortic root dimension: 40 mm   (ED). - Mitral valve: Mildly calcified annulus. There was mild to   moderate regurgitation. - Left atrium: The atrium was mildly dilated. - Right ventricle: Pacer wire or catheter noted in right ventricle.   Systolic function was mildly reduced. - Right atrium: Pacer wire or catheter noted in right atrium. - Tricuspid valve:  There was mild regurgitation.  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 Consider contrast   enhancement to evaluate for potential thrombus.  Assessment and Plan:  1. Chronic systolic heart failure. Weight is stable. He is on good medical regimen. We did discuss possibility of Entresto, although I am concerned about making a switch with his low blood pressure at baseline. He seems somewhat hesitant to make any changes now either. We will continue to follow.  2. Ischemic cardiomyopathy with LVEF approximately 30%. He had evidence of myocardial scar by Cardiolite 2015. Does report worsening dyspnea on exertion, we will obtain a follow-up Myoview study. Could be that he is having more atrial fibrillation as well.  3. Paroxysmal atrial fibrillation, continues on Coumadin.  4. St. Jude ICD in place, followed by  Dr. Rayann Smith.  Current medicines were reviewed with the patient today.   Orders Placed This Encounter  Procedures  . NM Myocar Multi W/Spect W/Wall Motion / EF    Disposition: Follow-up with me in 6 months, sooner if symptoms escalate or stress test results require further evaluation.  Signed, Satira Sark, MD, [facility] 04/15/2016 9:23 AM    [facility] [address], [place], [place] [address] Phone: [phone]; Fax: [phone]

## 2016-04-15 ENCOUNTER — Encounter: Payer: Self-pay | Admitting: *Deleted

## 2016-04-15 ENCOUNTER — Encounter: Payer: Self-pay | Admitting: Cardiology

## 2016-04-15 ENCOUNTER — Ambulatory Visit (INDEPENDENT_AMBULATORY_CARE_PROVIDER_SITE_OTHER): Payer: BLUE CROSS/BLUE SHIELD | Admitting: Cardiology

## 2016-04-15 VITALS — BP 95/65 | HR 74 | Ht 73.0 in | Wt 230.2 lb

## 2016-04-15 DIAGNOSIS — I2510 Atherosclerotic heart disease of native coronary artery without angina pectoris: Secondary | ICD-10-CM | POA: Diagnosis not present

## 2016-04-15 DIAGNOSIS — I5022 Chronic systolic (congestive) heart failure: Secondary | ICD-10-CM

## 2016-04-15 DIAGNOSIS — I480 Paroxysmal atrial fibrillation: Secondary | ICD-10-CM

## 2016-04-15 DIAGNOSIS — R0609 Other forms of dyspnea: Secondary | ICD-10-CM | POA: Diagnosis not present

## 2016-04-15 DIAGNOSIS — I255 Ischemic cardiomyopathy: Secondary | ICD-10-CM

## 2016-04-15 NOTE — Patient Instructions (Addendum)
Medication Instructions:   Your physician recommends that you continue on your current medications as directed. Please refer to the Current Medication list given to you today.  Labwork:  NONE  Testing/Procedures: Your physician has requested that you have a lexiscan myoview. For further information please visit [email or web address]. Please follow instruction sheet, as given.  Follow-Up:  Your physician recommends that you schedule a follow-up appointment in: 6 months. You will receive a reminder letter in the mail in about 4 months reminding you to call and schedule your appointment. If you don't receive this letter, please contact our office.  Any Other Special Instructions Will Be Listed Below (If Applicable).  If you need a refill on your cardiac medications before your next appointment, please call your pharmacy. 

## 2016-04-26 ENCOUNTER — Inpatient Hospital Stay (HOSPITAL_COMMUNITY): Admission: RE | Admit: 2016-04-26 | Payer: BLUE CROSS/BLUE SHIELD | Source: Ambulatory Visit

## 2016-04-26 ENCOUNTER — Encounter (HOSPITAL_COMMUNITY)
Admission: RE | Admit: 2016-04-26 | Discharge: 2016-04-26 | Disposition: A | Discharge status: Home / Self Care | Payer: BLUE CROSS/BLUE SHIELD | Source: Ambulatory Visit | Attending: Cardiology | Admitting: Cardiology

## 2016-04-26 ENCOUNTER — Encounter (HOSPITAL_COMMUNITY): Payer: Self-pay

## 2016-04-26 DIAGNOSIS — R931 Abnormal findings on diagnostic imaging of heart and coronary circulation: Secondary | ICD-10-CM | POA: Diagnosis not present

## 2016-04-26 DIAGNOSIS — I2510 Atherosclerotic heart disease of native coronary artery without angina pectoris: Secondary | ICD-10-CM | POA: Diagnosis not present

## 2016-04-26 DIAGNOSIS — R0609 Other forms of dyspnea: Secondary | ICD-10-CM

## 2016-04-26 HISTORY — DX: Essential (primary) hypertension: I10

## 2016-04-26 LAB — NM MYOCAR MULTI W/SPECT W/WALL MOTION / EF
CHL CUP NUCLEAR SDS: 2
CHL CUP NUCLEAR SRS: 27
CHL CUP NUCLEAR SSS: 29
CHL CUP RESTING HR STRESS: 51 {beats}/min
CSEPPHR: 76 {beats}/min
LV dias vol: 214 mL (ref 62–150)
LV sys vol: 129 mL
RATE: 0.38
TID: 1.06

## 2016-04-26 MED ORDER — SODIUM CHLORIDE 0.9% FLUSH
INTRAVENOUS | Status: AC
  Administered 2016-04-26: 10 mL via INTRAVENOUS
  Filled 2016-04-26: qty 10

## 2016-04-26 MED ORDER — TECHNETIUM TC 99M TETROFOSMIN IV KIT
30.0000 | PACK | Freq: Once | INTRAVENOUS | Status: AC | PRN
  Administered 2016-04-26: 31 via INTRAVENOUS

## 2016-04-26 MED ORDER — REGADENOSON 0.4 MG/5ML IV SOLN
INTRAVENOUS | Status: AC
  Administered 2016-04-26: 0.4 mg via INTRAVENOUS
  Filled 2016-04-26: qty 5

## 2016-04-26 MED ORDER — TECHNETIUM TC 99M TETROFOSMIN IV KIT
10.0000 | PACK | Freq: Once | INTRAVENOUS | Status: AC | PRN
  Administered 2016-04-26: 10.7 via INTRAVENOUS

## 2016-04-28 ENCOUNTER — Encounter: Payer: Self-pay | Admitting: *Deleted

## 2016-05-10 ENCOUNTER — Telehealth: Payer: Self-pay | Admitting: Cardiology

## 2016-05-10 NOTE — Telephone Encounter (Signed)
Patient would like to now the results from his last test performed at [facility]

## 2016-05-11 NOTE — Telephone Encounter (Signed)
Pt says he was having computer trouble and could not read [employer]. Pt aware of stress test results

## 2016-05-18 ENCOUNTER — Ambulatory Visit (INDEPENDENT_AMBULATORY_CARE_PROVIDER_SITE_OTHER): Payer: BLUE CROSS/BLUE SHIELD | Admitting: *Deleted

## 2016-05-18 DIAGNOSIS — I6350 Cerebral infarction due to unspecified occlusion or stenosis of unspecified cerebral artery: Secondary | ICD-10-CM | POA: Diagnosis not present

## 2016-05-18 DIAGNOSIS — Z7901 Long term (current) use of anticoagulants: Secondary | ICD-10-CM

## 2016-05-18 DIAGNOSIS — I4891 Unspecified atrial fibrillation: Secondary | ICD-10-CM

## 2016-05-18 DIAGNOSIS — Z5181 Encounter for therapeutic drug level monitoring: Secondary | ICD-10-CM | POA: Diagnosis not present

## 2016-05-18 LAB — POCT INR: INR: 2.2

## 2016-05-24 ENCOUNTER — Ambulatory Visit (INDEPENDENT_AMBULATORY_CARE_PROVIDER_SITE_OTHER): Payer: BLUE CROSS/BLUE SHIELD | Admitting: *Deleted

## 2016-05-24 DIAGNOSIS — I255 Ischemic cardiomyopathy: Secondary | ICD-10-CM

## 2016-05-24 NOTE — Progress Notes (Signed)
Remote ICD transmission.   

## 2016-05-27 ENCOUNTER — Encounter: Payer: Self-pay | Admitting: Cardiology

## 2016-06-01 LAB — CUP PACEART REMOTE DEVICE CHECK
Date Time Interrogation Session: 20170905065434
HighPow Impedance: 48 Ohm
Implantable Lead Implant Date: 20020111
Implantable Lead Location: 753860
Lead Channel Impedance Value: 610 Ohm
Lead Channel Pacing Threshold Amplitude: 0.5 V
Lead Channel Pacing Threshold Pulse Width: 0.5 ms
Lead Channel Sensing Intrinsic Amplitude: 10.2 mV
MDC IDC LEAD SERIAL: 116740
MDC IDC MSMT BATTERY REMAINING LONGEVITY: 11 mo
MDC IDC MSMT BATTERY VOLTAGE: 2.53 V
MDC IDC SET LEADCHNL RV PACING AMPLITUDE: 2.5 V
MDC IDC SET LEADCHNL RV PACING PULSEWIDTH: 0.5 ms
MDC IDC SET LEADCHNL RV SENSING SENSITIVITY: 0.3 mV
MDC IDC STAT BRADY RV PERCENT PACED: 1 %
Pulse Gen Serial Number: 557946

## 2016-06-03 ENCOUNTER — Other Ambulatory Visit: Payer: Self-pay | Admitting: Cardiology

## 2016-06-03 MED ORDER — WARFARIN SODIUM 5 MG PO TABS: ORAL_TABLET | ORAL | 0 refills | Status: DC

## 2016-06-03 NOTE — Telephone Encounter (Signed)
Mr. Brenneke needs refill on warfarin (COUMADIN) 5 MG tablet   He is requesting a 90 day supply due to a change in insurance . Please send to [facility] - He requested if you need to call him please call the cell phone # he is going out of town and needs refill asap.

## 2016-06-03 NOTE — Telephone Encounter (Signed)
Done

## 2016-06-10 ENCOUNTER — Encounter: Payer: Self-pay | Admitting: Cardiology

## 2016-07-04 ENCOUNTER — Ambulatory Visit (INDEPENDENT_AMBULATORY_CARE_PROVIDER_SITE_OTHER): Payer: BLUE CROSS/BLUE SHIELD | Admitting: *Deleted

## 2016-07-04 DIAGNOSIS — I4891 Unspecified atrial fibrillation: Secondary | ICD-10-CM

## 2016-07-04 DIAGNOSIS — Z5181 Encounter for therapeutic drug level monitoring: Secondary | ICD-10-CM

## 2016-07-04 DIAGNOSIS — I6350 Cerebral infarction due to unspecified occlusion or stenosis of unspecified cerebral artery: Secondary | ICD-10-CM

## 2016-07-04 DIAGNOSIS — Z7901 Long term (current) use of anticoagulants: Secondary | ICD-10-CM

## 2016-07-04 LAB — POCT INR: INR: 2.7

## 2016-08-02 ENCOUNTER — Ambulatory Visit: Payer: BLUE CROSS/BLUE SHIELD

## 2016-08-03 ENCOUNTER — Ambulatory Visit (INDEPENDENT_AMBULATORY_CARE_PROVIDER_SITE_OTHER): Payer: BLUE CROSS/BLUE SHIELD

## 2016-08-03 DIAGNOSIS — Z23 Encounter for immunization: Secondary | ICD-10-CM | POA: Diagnosis not present

## 2016-08-15 ENCOUNTER — Ambulatory Visit (INDEPENDENT_AMBULATORY_CARE_PROVIDER_SITE_OTHER): Payer: BLUE CROSS/BLUE SHIELD | Admitting: *Deleted

## 2016-08-15 DIAGNOSIS — Z7901 Long term (current) use of anticoagulants: Secondary | ICD-10-CM | POA: Diagnosis not present

## 2016-08-15 DIAGNOSIS — I6350 Cerebral infarction due to unspecified occlusion or stenosis of unspecified cerebral artery: Secondary | ICD-10-CM | POA: Diagnosis not present

## 2016-08-15 DIAGNOSIS — Z5181 Encounter for therapeutic drug level monitoring: Secondary | ICD-10-CM | POA: Diagnosis not present

## 2016-08-15 DIAGNOSIS — I4891 Unspecified atrial fibrillation: Secondary | ICD-10-CM

## 2016-08-15 LAB — POCT INR: INR: 2.6

## 2016-08-18 IMAGING — NM NM MYOCAR MULTI W/SPECT W/WALL MOTION & EF
2 series · 12 of 12 positions shown · non-contrast
Comparison: none

CLINICAL DATA: 63-year-old male with history of CAD status post BMS
to the LAD and obtuse marginal in 2413, ischemic cardiomyopathy with
LVEF 30-35%, and PAF. This study is requested to evaluate for the
presence and extent of ischemia.

EXAM:
MYOCARDIAL IMAGING WITH SPECT (REST AND PHARMACOLOGIC-STRESS)
GATED LEFT VENTRICULAR WALL MOTION STUDY
LEFT VENTRICULAR EJECTION FRACTION
TECHNIQUE: Standard myocardial SPECT imaging was performed after resting
intravenous injection of 10 mCi Ic-77m sestamibi. Subsequently,
intravenous infusion of Lexiscan was performed under the supervision
of the Cardiology staff. At peak effect of the drug, 30 mCi Ic-77m
sestamibi was injected intravenously and standard myocardial SPECT
imaging was performed. Quantitative gated imaging was also performed
to evaluate left ventricular wall motion, and estimate left
ventricular ejection fraction.

[Series 1: rest · 8.28mm/px · 6 of 64 frames shown]
[frame 6/64]
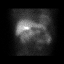
[frame 16/64]
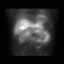
[frame 27/64]
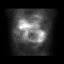
[frame 38/64]
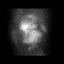
[frame 48/64]
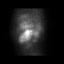
[frame 59/64]
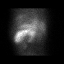

[Series 2: stress gated · 8.28mm/px · 6 of 64 frames shown]
[frame 6/64]
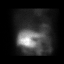
[frame 16/64]
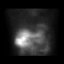
[frame 27/64]
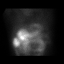
[frame 38/64]
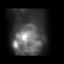
[frame 48/64]
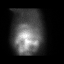
[frame 59/64]
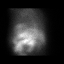

[12 of 12 positions shown; findings below may reference images not displayed]

FINDINGS: Baseline tracing shows sinus bradycardia at 47 beats per min with
evidence of old anterior wall infarct and nonspecific T-wave
changes. Lexiscan bolus was given in standard fashion. Heart rate
increased from 46 beats per min up to 75 beats per min, and blood
without chest pain. There were no diagnostic ST segment changes, and
no arrhythmias were noted.

Analysis of the overall perfusion data finds adequate myocardial
radiotracer uptake.

Perfusion: There is a large, dense, fixed apical/anteroseptal/
inferoapical defect is consistent with scar. No significant ischemia
noted.

Wall Motion: Periapical akinesis.

Left Ventricular Ejection Fraction: 35 %

End diastolic volume 2444 ml

End systolic volume 133 ml
IMPRESSION: 1. Abnormal study. Evidence of extensive scar involving the
periapical region, anteroapical and inferoapical walls. No
significant ischemia..

2. Ischemic cardiomyopathy with periapical akinesis..

3. Left ventricular ejection fraction 35%

4. Intermediate-risk stress test findings*.

*9179 Appropriate Use Criteria for Coronary Revascularization
Focused Update: J Am Coll Cardiol. 9179;59(9):857-881.
[URL]

## 2016-08-27 ENCOUNTER — Other Ambulatory Visit: Payer: Self-pay | Admitting: Cardiology

## 2016-09-23 ENCOUNTER — Ambulatory Visit (INDEPENDENT_AMBULATORY_CARE_PROVIDER_SITE_OTHER): Payer: BLUE CROSS/BLUE SHIELD | Admitting: Internal Medicine

## 2016-09-23 ENCOUNTER — Encounter: Payer: Self-pay | Admitting: Internal Medicine

## 2016-09-23 VITALS — BP 101/68 | HR 52 | Ht 73.0 in | Wt 236.0 lb

## 2016-09-23 DIAGNOSIS — I5022 Chronic systolic (congestive) heart failure: Secondary | ICD-10-CM | POA: Diagnosis not present

## 2016-09-23 DIAGNOSIS — I255 Ischemic cardiomyopathy: Secondary | ICD-10-CM

## 2016-09-23 DIAGNOSIS — I480 Paroxysmal atrial fibrillation: Secondary | ICD-10-CM | POA: Diagnosis not present

## 2016-09-23 DIAGNOSIS — Z95810 Presence of automatic (implantable) cardiac defibrillator: Secondary | ICD-10-CM

## 2016-09-23 NOTE — Patient Instructions (Signed)
Medication Instructions:  Continue all current medications.  Labwork: none  Testing/Procedures: none  Follow-Up: Your physician wants you to follow up in: 6 months.  You will receive a reminder letter in the mail one-two months in advance.  If you don't receive a letter, please call our office to schedule the follow up appointment   Any Other Special Instructions Will Be Listed Below (If Applicable). Remote monitoring is used to monitor your Pacemaker of ICD from home. This monitoring reduces the number of office visits required to check your device to one time per year. It allows [place] to keep an eye on the functioning of your device to ensure it is working properly. You are scheduled for a device check from home on 12/26/2016.  You may send your transmission at any time that day. If you have a wireless device, the transmission will be sent automatically. After your physician reviews your transmission, you will receive a postcard with your next transmission date.  If you need a refill on your cardiac medications before your next appointment, please call your pharmacy.

## 2016-09-23 NOTE — Progress Notes (Signed)
PCP: Rory Percy, MD Primary Cardiologist: Dr Domenic Polite  The patient presents today for routine electrophysiology followup.  Since last being seen in our clinic, the patient reports doing very well.  He is considering [employer].  Today, he denies symptoms of chest pain, orthopnea, PND, lower extremity edema, presyncope, syncope, or neurologic sequela.  The patient feels that he is tolerating medications without difficulties and is otherwise without complaint today.   Past Medical History:  Diagnosis Date  . A-V fistula ([place])   . Anterior myocardial infarction ([place])   . Chronic systolic heart failure ([place])   . Coronary atherosclerosis of native coronary artery    BMS to LAD an OM1 - patent at catheterization 11/13   . Hypertension   . Inducible ventricular tachycardia ([place])   . Ischemic cardiomyopathy    LVEF 30%, status post ICD  . Paroxysmal atrial fibrillation ([facility])    On coumadin  . Pseudoaneurysm ([place])   . Stroke [facility])    Past Surgical History:  Procedure Laterality Date  . AICD implantation  2011   St Jude Current ICD    Current Outpatient Prescriptions  Medication Sig Dispense Refill  . aspirin 81 MG tablet Take 81 mg by mouth daily.      . bisoprolol (ZEBETA) 5 MG tablet TAKE 1 TABLET (5 MG TOTAL) BY MOUTH DAILY. 90 tablet 3  . finasteride (PROSCAR) 5 MG tablet Take 5 mg by mouth Daily.     [name] losartan (COZAAR) 50 MG tablet TAKE 1 TABLET BY MOUTH EVERY DAY 30 tablet 6  . nitroGLYCERIN (NITROSTAT) 0.4 MG SL tablet Place 1 tablet (0.4 mg total) under the tongue every 5 (five) minutes as needed for chest pain. 25 tablet 3  . omega-3 acid ethyl esters (LOVAZA) 1 G capsule Take 1 g by mouth 2 (two) times daily.    . rosuvastatin (CRESTOR) 20 MG tablet TAKE 1 TABLET (20 MG TOTAL) BY MOUTH DAILY. 90 tablet 3  . spironolactone (ALDACTONE) 25 MG tablet TAKE 1 TABLET BY MOUTH EVERY DAY 30 tablet 6  . warfarin (COUMADIN) 5 MG tablet TAKE 1 TABLET DAILY EXCEPT 1/2 A TABLET  ON MONDAYS AND FRIDAYS 90 tablet 6   No current facility-administered medications for this visit.     No Known Allergies  Social History   Social History  . Marital status: Married    Spouse name: N/A  . Number of children: N/A  . Years of education: N/A   Occupational History  . Not on file.   Social History Main Topics  . Smoking status: Never Smoker  . Smokeless tobacco: Never Used  . Alcohol use No  . Drug use: No  . Sexual activity: Not on file   Other Topics Concern  . Not on file   Social History Narrative  . No narrative on file    Physical Exam: Vitals:   09/23/16 1016  BP: 101/68  Pulse: (!) 52  SpO2: 95%  Weight: 236 lb (107 kg)  Height: 6\' 1"  (1.854 m)    GEN- The patient is well appearing, alert and oriented x 3 today.   Head- normocephalic, atraumatic Eyes-  Sclera clear, conjunctiva pink Ears- hearing intact Oropharynx- clear Neck- supple, no JVP Lymph- no cervical lymphadenopathy Lungs- Clear to ausculation bilaterally, normal work of breathing Chest- ICD pocket is well healed Heart- Regular rate and rhythm, no murmurs, rubs or gallops, PMI not laterally displaced GI- soft, NT, ND, + BS Extremities- no clubbing, cyanosis, or  edema  ICD interrogation- reviewed in detail today,  See PACEART report EKG from 1/17 reveals QRS < 120 msec Echo 8/16 reveals EF 25-30%  Assessment and Plan:  1. CAD/ ischemic CM/ chronic systolic dysfunction Normal ICD function See Pace Art report No changes today Estimated battery longevity is 5 months.  Patient vibratory alert demonstrated today. Risks, benefits, and alternatives to ICD pulse generator replacement were discussed in detail today.  The patient understands that risks include but are not limited to bleeding, infection, pneumothorax, perforation, tamponade, vascular damage, renal failure, MI, stroke, death, inappropriate shocks, damage to his existing leads, and lead dislodgement and wishes to  proceed once he reaches ERI.  If this occurs in the next 6 months, ok to schedule generator change without a repeat office visit.  I would want repeat echo and ekg prior to the procedure. Would hold coumadin for 2 days prior to the procedure.  2. afib Continue coumadin He is not interested in NOAC therapy per primary conversations  3. Overweight Lifestyle modification is encouraged  4. HL Stable No change required today  [place] Return in 6 months if he does not reach ERI before then  Thompson Grayer MD, [facility] 09/23/2016 11:05 AM

## 2016-09-26 LAB — CUP PACEART INCLINIC DEVICE CHECK
Battery Voltage: 2.5 V
HIGH POWER IMPEDANCE MEASURED VALUE: 52.2576
Implantable Lead Implant Date: 20020111
Implantable Pulse Generator Implant Date: 20100210
Lead Channel Pacing Threshold Amplitude: 0.75 V
Lead Channel Pacing Threshold Pulse Width: 0.5 ms
Lead Channel Pacing Threshold Pulse Width: 0.5 ms
Lead Channel Setting Pacing Pulse Width: 0.5 ms
Lead Channel Setting Sensing Sensitivity: 0.3 mV
MDC IDC LEAD LOCATION: 753860
MDC IDC LEAD SERIAL: 116740
MDC IDC MSMT LEADCHNL RV IMPEDANCE VALUE: 650 Ohm
MDC IDC MSMT LEADCHNL RV PACING THRESHOLD AMPLITUDE: 0.75 V
MDC IDC MSMT LEADCHNL RV SENSING INTR AMPL: 11.3 mV
MDC IDC SESS DTM: 20180105162757
MDC IDC SET LEADCHNL RV PACING AMPLITUDE: 2.5 V
MDC IDC STAT BRADY RV PERCENT PACED: 0.84 %
Pulse Gen Serial Number: 557946

## 2016-10-03 ENCOUNTER — Ambulatory Visit (INDEPENDENT_AMBULATORY_CARE_PROVIDER_SITE_OTHER): Payer: BLUE CROSS/BLUE SHIELD | Admitting: *Deleted

## 2016-10-03 DIAGNOSIS — I6350 Cerebral infarction due to unspecified occlusion or stenosis of unspecified cerebral artery: Secondary | ICD-10-CM

## 2016-10-03 DIAGNOSIS — I4891 Unspecified atrial fibrillation: Secondary | ICD-10-CM | POA: Diagnosis not present

## 2016-10-03 DIAGNOSIS — Z7901 Long term (current) use of anticoagulants: Secondary | ICD-10-CM

## 2016-10-03 DIAGNOSIS — Z5181 Encounter for therapeutic drug level monitoring: Secondary | ICD-10-CM

## 2016-10-03 LAB — POCT INR: INR: 2.8

## 2016-10-19 NOTE — Progress Notes (Signed)
Cardiology Office Note  Date: 10/20/2016   ID: [name], DOB 10-26-1950, MRN [id number]  PCP: Rory Percy, MD  Primary Cardiologist: Rozann Lesches, MD   Chief Complaint  Patient presents with  . Cardiomyopathy  . Coronary Artery Disease    History of Present Illness: [name] is a 66 y.o. male last seen in July 2017. He presents for a routine follow-up visit. 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 He has been spending time with his grandchildren. Still misses business. He does not report any angina symptoms, has NYHA class II dyspnea. He has had no palpitations or syncope.  He continues to follow in the anticoagulation clinic on Coumadin. No bleeding problems. Last INR was 2.8.  Follow-up with [name] continues, [place] ICD in place. I reviewed the recent note. Follow-up echocardiogram recommended prior to anticipated generator change in the spring.  Today he tells me that he has been experiencing intermittent left leg discomfort and tingling. Hard to sort out based on his description whether this is neuropathic or arterial in nature. He does not report back pain. He also states that he was previously diagnosed with significant obstructive sleep apnea back in 2014 based on sleep testing. He has not been able to tolerate CPAP. His wife has told him that he is having more apnea episodes. He has not had this reevaluated.  I reviewed his ECG today which shows sinus rhythm with PVCs, old anterior infarct pattern, low voltage.  We went over his medications today which are stable. We did discuss the possibility of trying to switch him to [facility], although I doubt that he would be able to tolerate much more than the lowest dose given his low blood pressures at baseline. For now he was most comfortable keeping things stable.  Past Medical History:  Diagnosis Date  . A-V fistula ([place])   . Anterior myocardial infarction ([place])   .  Chronic systolic heart failure ([place])   . Coronary atherosclerosis of native coronary artery    BMS to LAD an OM1 - patent at catheterization 11/13   . Hypertension   . 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 Ischemic cardiomyopathy    LVEF 30%, status post ICD  . Paroxysmal atrial fibrillation (HCC)    On coumadin  . Pseudoaneurysm ([place])   . Stroke [facility])     Past Surgical History:  Procedure Laterality Date  . AICD implantation  2011   St Jude Current ICD    Current Outpatient Prescriptions  Medication Sig Dispense Refill  . aspirin 81 MG tablet Take 81 mg by mouth daily.      . bisoprolol (ZEBETA) 5 MG tablet TAKE 1 TABLET (5 MG TOTAL) BY MOUTH DAILY. 90 tablet 3  . finasteride (PROSCAR) 5 MG tablet Take 5 mg by mouth Daily.     [name] losartan (COZAAR) 50 MG tablet TAKE 1 TABLET BY MOUTH EVERY DAY 30 tablet 6  . nitroGLYCERIN (NITROSTAT) 0.4 MG SL tablet Place 1 tablet (0.4 mg total) under the tongue every 5 (five) minutes as needed for chest pain. 25 tablet 3  . omega-3 acid ethyl esters (LOVAZA) 1 G capsule Take 1 g by mouth 2 (two) times daily.    . rosuvastatin (CRESTOR) 20 MG tablet TAKE 1 TABLET (20 MG TOTAL) BY MOUTH DAILY. 90 tablet 3  . spironolactone (ALDACTONE) 25 MG tablet TAKE 1 TABLET BY MOUTH EVERY DAY 30 tablet 6  . warfarin (COUMADIN)  5 MG tablet TAKE 1 TABLET DAILY EXCEPT 1/2 A TABLET ON MONDAYS AND FRIDAYS 90 tablet 6   No current facility-administered medications for this visit.    Allergies:  Patient has no known allergies.   Social History: The patient  reports that he has never smoked. He has never used smokeless tobacco. He reports that he does not drink alcohol or use drugs.   ROS:  Please see the history of present illness. Otherwise, complete review of systems is positive for none.  All other systems are reviewed and negative.   Physical Exam: VS:  BP 110/78   Pulse 68   Ht 6\' 1"  (1.854 m)   Wt 234 lb (106.1 kg)   SpO2 98%   BMI 30.87 kg/m ,  BMI Body mass index is 30.87 kg/m.  Wt Readings from Last 3 Encounters:  10/20/16 234 lb (106.1 kg)  09/23/16 236 lb (107 kg)  04/15/16 230 lb 3.2 oz (104.4 kg)    Appears comfortable at rest.  HEENT: Conjunctiva and lids normal, oropharynx clear.  Neck: Supple, no elevated JVP or carotid bruits, no thyromegaly.  Lungs: Clear to auscultation, nonlabored breathing at rest.  Thorax: Stable device pocket site.  Cardiac: RRR with ectopy, no S3 or significant systolic murmur, no pericardial rub.  Abdomen: Soft, nontender, bowel sounds present, no guarding or rebound.  Extremities: No pitting edema, distal pulses 1+.  Skin: Warm and dry.  Musculoskeletal: No kyphosis. Neuropsychiatric: Alert and oriented 3, affect appropriate.  ECG: I personally reviewed the tracing from 09/25/2015 which showed normal sinus rhythm with left anterior fascicular block and old anterior infarct pattern.  Recent Labwork:  December 2016: AST 19, ALT 18, triglycerides 101, cholesterol 139, HDL 53, LDL 66  Other Studies Reviewed Today:  Echocardiogram 05/07/2015: Study Conclusions  - Left ventricle: The cavity size was normal. There was mild   concentric hypertrophy. Systolic function was severely reduced.   The estimated ejection fraction was in the range of 25% to 30%.   Doppler parameters are consistent with abnormal left ventricular   relaxation (grade 1 diastolic dysfunction). - Regional wall motion abnormality: Akinesis of the mid anterior,   mid anteroseptal, apical lateral, and apical myocardium; severe   hypokinesis of the apical anterior, apical inferior, and apical   septal myocardium; hypokinesis of the mid inferolateral   myocardium; moderate hypokinesis of the mid anterolateral   myocardium. - Aortic valve: Mildly to moderately calcified annulus. Trileaflet;   mildly thickened, mildly calcified leaflets. - Aorta: Mild aortic root dilatation. Aortic root dimension: 40 mm   (ED). - Mitral  valve: Mildly calcified annulus. There was mild to   moderate regurgitation. - Left atrium: The atrium was mildly dilated. - Right ventricle: Pacer wire or catheter noted in right ventricle.   Systolic function was mildly reduced. - Right atrium: Pacer wire or catheter noted in right atrium. - Tricuspid valve: There was mild regurgitation.  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 Consider contrast   enhancement to evaluate for potential thrombus.  Assessment and Plan:  1. Ischemic cardiomyopathy with LVEF 25-30% by last echocardiogram. Medical regimen is outlined above and stable, he reports no major change in symptoms or stamina. We did discuss the possibility of trying low-dose Entresto instead of Cozaar, for now he wanted to keep his regimen the same. We may well be limited by his relatively low blood pressures.  2. Chronic systolic heart failure, symptomatically stable with NYHA class II dyspnea. He is euvolemic.  3. St. Jude ICD in place, followed by [name]. No device shocks. Looks like he will be getting a generator change sometime in the spring and have an echocardiogram prior to that.  4. Left leg pain as outlined above. We will evaluate with lower extremity arterial Dopplers and ABIs, although symptoms may be neuropathic.  5. History of significant obstructive sleep apnea based on previous testing in 2014 at [place]. States that he has not been able to tolerate CPAP, his wife reports more apnea episodes. I will refer him for sleep evaluation through our [facility] practice.  6. Paroxysmal atrial fibrillation, he continues on Coumadin with follow-up in our anticoagulation clinic.  Current medicines were reviewed with the patient today.   Orders Placed This Encounter  Procedures  . Ambulatory referral to Cardiology  . EKG 12-Lead    Disposition: Follow-up in 6 months, sooner if needed.  Signed, Satira Sark, MD, [facility] 10/20/2016 9:36 AM     [facility] [address], [place], [place] [address] Phone: [phone]; Fax: [phone]

## 2016-10-20 ENCOUNTER — Encounter: Payer: Self-pay | Admitting: Cardiology

## 2016-10-20 ENCOUNTER — Ambulatory Visit (INDEPENDENT_AMBULATORY_CARE_PROVIDER_SITE_OTHER): Payer: BLUE CROSS/BLUE SHIELD | Admitting: Cardiology

## 2016-10-20 VITALS — BP 110/78 | HR 68 | Ht 73.0 in | Wt 234.0 lb

## 2016-10-20 DIAGNOSIS — I5022 Chronic systolic (congestive) heart failure: Secondary | ICD-10-CM

## 2016-10-20 DIAGNOSIS — M79605 Pain in left leg: Secondary | ICD-10-CM | POA: Diagnosis not present

## 2016-10-20 DIAGNOSIS — G4733 Obstructive sleep apnea (adult) (pediatric): Secondary | ICD-10-CM

## 2016-10-20 DIAGNOSIS — I255 Ischemic cardiomyopathy: Secondary | ICD-10-CM | POA: Diagnosis not present

## 2016-10-20 DIAGNOSIS — I480 Paroxysmal atrial fibrillation: Secondary | ICD-10-CM

## 2016-10-20 DIAGNOSIS — Z95810 Presence of automatic (implantable) cardiac defibrillator: Secondary | ICD-10-CM

## 2016-10-20 NOTE — Patient Instructions (Signed)
Your physician wants you to follow-up in: 6 months with Dr. Domenic Polite. You will receive a reminder letter in the mail two months in advance. If you don't receive a letter, please call our office to schedule the follow-up appointment.  Your physician has requested that you have an ankle brachial index (ABI). During this test an ultrasound and blood pressure cuff are used to evaluate the arteries that supply the arms and legs with blood. Allow thirty minutes for this exam. There are no restrictions or special instructions.  Your physician has requested that you have a lower or upper extremity arterial duplex. This test is an ultrasound of the arteries in the legs or arms. It looks at arterial blood flow in the legs and arms. Allow one hour for Lower and Upper Arterial scans. There are no restrictions or special instructions   You have been referred to Dr. [name] for evaluation of Sleep Apnea.    Thank you for choosing [facility]!!

## 2016-11-07 ENCOUNTER — Other Ambulatory Visit: Payer: Self-pay | Admitting: Cardiology

## 2016-11-07 DIAGNOSIS — I739 Peripheral vascular disease, unspecified: Secondary | ICD-10-CM

## 2016-11-09 ENCOUNTER — Other Ambulatory Visit: Payer: Self-pay | Admitting: *Deleted

## 2016-11-09 DIAGNOSIS — G4733 Obstructive sleep apnea (adult) (pediatric): Secondary | ICD-10-CM

## 2016-11-09 DIAGNOSIS — I4891 Unspecified atrial fibrillation: Secondary | ICD-10-CM

## 2016-11-10 ENCOUNTER — Telehealth: Payer: Self-pay | Admitting: Cardiology

## 2016-11-10 NOTE — Telephone Encounter (Signed)
A Sleep Study has been ordered to be scheduled for patient and then will see Dr. [name] two months after sleep study has been using new Cpap machine to see where he is at now with his Sleep Apnea . Dimple Casey

## 2016-11-14 ENCOUNTER — Ambulatory Visit (INDEPENDENT_AMBULATORY_CARE_PROVIDER_SITE_OTHER): Payer: BLUE CROSS/BLUE SHIELD | Admitting: *Deleted

## 2016-11-14 ENCOUNTER — Telehealth: Payer: Self-pay | Admitting: Cardiology

## 2016-11-14 DIAGNOSIS — I4891 Unspecified atrial fibrillation: Secondary | ICD-10-CM | POA: Diagnosis not present

## 2016-11-14 DIAGNOSIS — Z5181 Encounter for therapeutic drug level monitoring: Secondary | ICD-10-CM | POA: Diagnosis not present

## 2016-11-14 DIAGNOSIS — Z7901 Long term (current) use of anticoagulants: Secondary | ICD-10-CM | POA: Diagnosis not present

## 2016-11-14 DIAGNOSIS — I6350 Cerebral infarction due to unspecified occlusion or stenosis of unspecified cerebral artery: Secondary | ICD-10-CM

## 2016-11-14 LAB — POCT INR: INR: 3

## 2016-11-14 NOTE — Telephone Encounter (Signed)
Percert:  [name] called office stating that his insurance was going to require an authorization for his upcoming [place] and LE exam scheduled for 11-16-16.

## 2016-11-16 ENCOUNTER — Ambulatory Visit: Payer: BLUE CROSS/BLUE SHIELD

## 2016-11-16 DIAGNOSIS — I739 Peripheral vascular disease, unspecified: Secondary | ICD-10-CM

## 2016-11-16 DIAGNOSIS — R202 Paresthesia of skin: Secondary | ICD-10-CM | POA: Diagnosis not present

## 2016-11-17 ENCOUNTER — Other Ambulatory Visit: Payer: Self-pay | Admitting: Cardiology

## 2016-11-18 ENCOUNTER — Telehealth: Payer: Self-pay | Admitting: *Deleted

## 2016-11-18 NOTE — Telephone Encounter (Signed)
-----   Message from Satira Sark, MD sent at 11/17/2016  7:48 AM EST ----- Results reviewed. Reassuring overall results not indicating any obstructive lower extremity arterial disease. A copy of this test should be forwarded to Rory Percy, MD.

## 2016-11-22 DIAGNOSIS — C4491 Basal cell carcinoma of skin, unspecified: Secondary | ICD-10-CM

## 2016-11-22 HISTORY — DX: Basal cell carcinoma of skin, unspecified: C44.91

## 2016-11-23 NOTE — Telephone Encounter (Signed)
Patient informed and copy sent to PCP. 

## 2016-12-01 ENCOUNTER — Telehealth: Payer: Self-pay | Admitting: Internal Medicine

## 2016-12-01 ENCOUNTER — Telehealth: Payer: Self-pay | Admitting: *Deleted

## 2016-12-01 NOTE — Telephone Encounter (Signed)
Patient walked in to let us know that his device has "buzzed" couple of times today. Dr [name] told him this would happen when he device needed changed.  He is not experiencing any pain or other symptoms

## 2016-12-01 NOTE — Telephone Encounter (Signed)
Spoke with patient who was able to send a remote transmission. Device reached ERI 3/15. Per last OV note in January patient did not need a repeat OV if he reached ERI in the upcoming 33mo. Message sent to [facility] for a generator change out to be schedule. Discussed plan with patient who verbalized understanding.

## 2016-12-01 NOTE — Telephone Encounter (Signed)
Spoke with patient, stated device buzzed earlier.  Denies chest pain, dizziness, or SOB.  Message fwd to device clinic.

## 2016-12-01 NOTE — Telephone Encounter (Signed)
error 

## 2016-12-05 ENCOUNTER — Ambulatory Visit: Payer: BLUE CROSS/BLUE SHIELD | Admitting: Cardiovascular Disease

## 2016-12-26 ENCOUNTER — Ambulatory Visit (INDEPENDENT_AMBULATORY_CARE_PROVIDER_SITE_OTHER): Payer: BLUE CROSS/BLUE SHIELD | Admitting: *Deleted

## 2016-12-26 DIAGNOSIS — I4891 Unspecified atrial fibrillation: Secondary | ICD-10-CM

## 2016-12-26 DIAGNOSIS — I255 Ischemic cardiomyopathy: Secondary | ICD-10-CM | POA: Diagnosis not present

## 2016-12-26 DIAGNOSIS — I6350 Cerebral infarction due to unspecified occlusion or stenosis of unspecified cerebral artery: Secondary | ICD-10-CM

## 2016-12-26 DIAGNOSIS — Z5181 Encounter for therapeutic drug level monitoring: Secondary | ICD-10-CM | POA: Diagnosis not present

## 2016-12-26 DIAGNOSIS — Z7901 Long term (current) use of anticoagulants: Secondary | ICD-10-CM | POA: Diagnosis not present

## 2016-12-26 LAB — POCT INR: INR: 2.6

## 2016-12-27 NOTE — Progress Notes (Signed)
Remote ICD transmission.   

## 2016-12-28 ENCOUNTER — Encounter: Payer: Self-pay | Admitting: Cardiology

## 2016-12-30 LAB — CUP PACEART REMOTE DEVICE CHECK
Battery Voltage: 2.42 V
Brady Statistic RV Percent Paced: 1 %
HIGH POWER IMPEDANCE MEASURED VALUE: 48 Ohm
Implantable Lead Model: 148
Implantable Pulse Generator Implant Date: 20100210
Lead Channel Pacing Threshold Amplitude: 0.75 V
Lead Channel Sensing Intrinsic Amplitude: 11 mV
Lead Channel Setting Pacing Amplitude: 2.5 V
Lead Channel Setting Sensing Sensitivity: 0.3 mV
MDC IDC LEAD IMPLANT DT: 20020111
MDC IDC LEAD LOCATION: 753860
MDC IDC LEAD SERIAL: 116740
MDC IDC MSMT BATTERY REMAINING LONGEVITY: 0 mo
MDC IDC MSMT LEADCHNL RV IMPEDANCE VALUE: 610 Ohm
MDC IDC MSMT LEADCHNL RV PACING THRESHOLD PULSEWIDTH: 0.5 ms
MDC IDC PG SERIAL: 557946
MDC IDC SESS DTM: 20180409101040
MDC IDC SET LEADCHNL RV PACING PULSEWIDTH: 0.5 ms

## 2017-01-02 ENCOUNTER — Other Ambulatory Visit: Payer: Self-pay | Admitting: Cardiology

## 2017-01-03 ENCOUNTER — Telehealth: Payer: Self-pay

## 2017-01-03 NOTE — Telephone Encounter (Addendum)
Called and LVM to schedule an Echo prior to generator change per JA's result note. Echo date and time April 23 at 8:30am and 11:30am at [place] office had no availably before scheduled gen change on 5/3~ routed to [employer]

## 2017-01-05 ENCOUNTER — Telehealth: Payer: Self-pay

## 2017-01-05 NOTE — Telephone Encounter (Signed)
Left Voicemail for pt regarding apt for labs to be drawn on 01/11/2017 at [address] office prior to ICD generator  change.

## 2017-01-05 NOTE — Telephone Encounter (Signed)
Spoke with [name] about Echo scheduled 4/23 at 11:30am, pt agreeable to this appointment.

## 2017-01-05 NOTE — Progress Notes (Signed)
Left Voicemail for pt regarding apt for labs to be drawn on 01/11/2017 prior to gen change.

## 2017-01-05 NOTE — Addendum Note (Signed)
Addended by: Wanda Plump on: 01/05/2017 01:36 PM   Modules accepted: Orders

## 2017-01-06 ENCOUNTER — Telehealth: Payer: Self-pay | Admitting: Internal Medicine

## 2017-01-06 NOTE — Telephone Encounter (Signed)
Patient called asking about when his sleep study will be scheduled?  Stated he was told by Dr [name] office that they would contact him to schedule.

## 2017-01-09 ENCOUNTER — Ambulatory Visit (HOSPITAL_COMMUNITY)
Admission: RE | Admit: 2017-01-09 | Discharge: 2017-01-09 | Disposition: A | Discharge status: Home / Self Care | Payer: BLUE CROSS/BLUE SHIELD | Source: Ambulatory Visit | Attending: Internal Medicine | Admitting: Internal Medicine

## 2017-01-09 ENCOUNTER — Other Ambulatory Visit: Payer: Self-pay | Admitting: *Deleted

## 2017-01-09 DIAGNOSIS — Z95810 Presence of automatic (implantable) cardiac defibrillator: Secondary | ICD-10-CM | POA: Diagnosis not present

## 2017-01-09 DIAGNOSIS — I255 Ischemic cardiomyopathy: Secondary | ICD-10-CM

## 2017-01-09 DIAGNOSIS — E785 Hyperlipidemia, unspecified: Secondary | ICD-10-CM | POA: Insufficient documentation

## 2017-01-09 DIAGNOSIS — I4891 Unspecified atrial fibrillation: Secondary | ICD-10-CM | POA: Diagnosis not present

## 2017-01-09 LAB — ECHOCARDIOGRAM COMPLETE
EERAT: 7.2
EWDT: 232 ms
FS: 20 % — AB (ref 28–44)
IVS/LV PW RATIO, ED: 0.88
LA diam end sys: 41 mm
LA diam index: 1.78 cm/m2
LA vol A4C: 98.6 ml
LA vol index: 43.5 mL/m2
LASIZE: 41 mm
LAVOL: 100 mL
LV E/e'average: 7.2
LV PW d: 14.8 mm — AB (ref 0.6–1.1)
LV TDI E'MEDIAL: 8.05
LV e' LATERAL: 7.94 cm/s
LVEEMED: 7.2
LVOT area: 3.46 cm2
LVOTD: 21 mm
MV Dec: 232
MV pk A vel: 54.3 m/s
MVPKEVEL: 57.2 m/s
Reg peak vel: 240 cm/s
TAPSE: 12.2 mm
TDI e' lateral: 7.94
TR max vel: 240 cm/s

## 2017-01-09 MED ORDER — PERFLUTREN LIPID MICROSPHERE
1.0000 mL | INTRAVENOUS | Status: AC | PRN
  Administered 2017-01-09: 2 mL via INTRAVENOUS
  Filled 2017-01-09: qty 10

## 2017-01-09 NOTE — Progress Notes (Signed)
*  PRELIMINARY RESULTS* Echocardiogram 2D Echocardiogram with definity has been performed.  [name] 01/09/2017, 2:02 PM

## 2017-01-10 NOTE — Telephone Encounter (Signed)
Sent Staff message to Brunetta Genera at [facility] to check on this.

## 2017-01-11 ENCOUNTER — Other Ambulatory Visit: Payer: BLUE CROSS/BLUE SHIELD | Admitting: *Deleted

## 2017-01-11 DIAGNOSIS — I255 Ischemic cardiomyopathy: Secondary | ICD-10-CM

## 2017-01-11 LAB — CBC
HEMATOCRIT: 44.6 % (ref 37.5–51.0)
HEMOGLOBIN: 15.3 g/dL (ref 13.0–17.7)
MCH: 31.9 pg (ref 26.6–33.0)
MCHC: 34.3 g/dL (ref 31.5–35.7)
MCV: 93 fL (ref 79–97)
Platelets: 158 10*3/uL (ref 150–379)
RBC: 4.8 x10E6/uL (ref 4.14–5.80)
RDW: 13.8 % (ref 12.3–15.4)
WBC: 9.5 10*3/uL (ref 3.4–10.8)

## 2017-01-11 LAB — BASIC METABOLIC PANEL
BUN / CREAT RATIO: 17 (ref 10–24)
BUN: 19 mg/dL (ref 8–27)
CO2: 24 mmol/L (ref 18–29)
CREATININE: 1.09 mg/dL (ref 0.76–1.27)
Calcium: 9.3 mg/dL (ref 8.6–10.2)
Chloride: 101 mmol/L (ref 96–106)
GFR, EST AFRICAN AMERICAN: 81 mL/min/{1.73_m2} (ref >59)
GFR, EST NON AFRICAN AMERICAN: 70 mL/min/{1.73_m2} (ref >59)
GLUCOSE: 92 mg/dL (ref 65–99)
Potassium: 4.6 mmol/L (ref 3.5–5.2)
SODIUM: 140 mmol/L (ref 134–144)

## 2017-01-11 LAB — PROTIME-INR
INR: 2.5 — AB (ref 0.8–1.2)
Prothrombin Time: 24.7 s — ABNORMAL HIGH (ref 9.1–12.0)

## 2017-01-12 ENCOUNTER — Telehealth: Payer: Self-pay | Admitting: Cardiovascular Disease

## 2017-01-12 ENCOUNTER — Telehealth: Payer: Self-pay

## 2017-01-12 NOTE — Telephone Encounter (Signed)
I thought that we already made this referral. Please check with the [place] office to determine what needs to occur for him to undergo a sleep study and evaluation by Dr. [name] - this is the main reason that I am referring him.

## 2017-01-12 NOTE — Telephone Encounter (Signed)
Requesting a sleep study to be done before seeing Dr. [name]

## 2017-01-12 NOTE — Telephone Encounter (Signed)
Sent to scheduler to reschedule appointment with Dr. [name] after sleep study.

## 2017-01-12 NOTE — Telephone Encounter (Signed)
Pt said he was instructed that he would need a sleep study test before he saw Dr [name]. He need to be scheduled for the Sleep Study.

## 2017-01-12 NOTE — Telephone Encounter (Signed)
Sleep study has been scheduled for May 4th at [facility]

## 2017-01-13 ENCOUNTER — Telehealth: Payer: Self-pay | Admitting: Cardiology

## 2017-01-13 ENCOUNTER — Telehealth: Payer: Self-pay | Admitting: Cardiovascular Disease

## 2017-01-13 NOTE — Telephone Encounter (Signed)
Apt with Dr. [name] rescheduled

## 2017-01-13 NOTE — Telephone Encounter (Signed)
Pre-cert Verification for the following procedure    Sleep study scheduled for 01/20/17.

## 2017-01-13 NOTE — Telephone Encounter (Signed)
Called patient and left a VM for the patient to call me back and schedule his Dr. [name] appointment.  First available sleep clinic appointment is 03-16-17.

## 2017-01-16 ENCOUNTER — Ambulatory Visit: Payer: BLUE CROSS/BLUE SHIELD | Admitting: Cardiovascular Disease

## 2017-01-17 ENCOUNTER — Other Ambulatory Visit: Payer: Self-pay | Admitting: Cardiology

## 2017-01-19 ENCOUNTER — Encounter (HOSPITAL_COMMUNITY): Payer: Self-pay | Admitting: *Deleted

## 2017-01-19 ENCOUNTER — Encounter (HOSPITAL_COMMUNITY)
Admission: RE | Disposition: A | Discharge status: Home / Self Care | Payer: Self-pay | Source: Ambulatory Visit | Attending: Internal Medicine

## 2017-01-19 ENCOUNTER — Ambulatory Visit (HOSPITAL_COMMUNITY)
Admission: RE | Admit: 2017-01-19 | Discharge: 2017-01-19 | Disposition: A | Discharge status: Home / Self Care | Payer: BLUE CROSS/BLUE SHIELD | Source: Ambulatory Visit | Attending: Internal Medicine | Admitting: Internal Medicine

## 2017-01-19 DIAGNOSIS — I110 Hypertensive heart disease with heart failure: Secondary | ICD-10-CM | POA: Insufficient documentation

## 2017-01-19 DIAGNOSIS — Z7982 Long term (current) use of aspirin: Secondary | ICD-10-CM | POA: Insufficient documentation

## 2017-01-19 DIAGNOSIS — Z7901 Long term (current) use of anticoagulants: Secondary | ICD-10-CM | POA: Insufficient documentation

## 2017-01-19 DIAGNOSIS — I472 Ventricular tachycardia: Secondary | ICD-10-CM | POA: Insufficient documentation

## 2017-01-19 DIAGNOSIS — Z8673 Personal history of transient ischemic attack (TIA), and cerebral infarction without residual deficits: Secondary | ICD-10-CM | POA: Diagnosis not present

## 2017-01-19 DIAGNOSIS — I5022 Chronic systolic (congestive) heart failure: Secondary | ICD-10-CM | POA: Insufficient documentation

## 2017-01-19 DIAGNOSIS — I2510 Atherosclerotic heart disease of native coronary artery without angina pectoris: Secondary | ICD-10-CM | POA: Diagnosis not present

## 2017-01-19 DIAGNOSIS — Z4502 Encounter for adjustment and management of automatic implantable cardiac defibrillator: Secondary | ICD-10-CM | POA: Insufficient documentation

## 2017-01-19 DIAGNOSIS — I480 Paroxysmal atrial fibrillation: Secondary | ICD-10-CM | POA: Diagnosis not present

## 2017-01-19 DIAGNOSIS — I255 Ischemic cardiomyopathy: Secondary | ICD-10-CM | POA: Insufficient documentation

## 2017-01-19 DIAGNOSIS — I252 Old myocardial infarction: Secondary | ICD-10-CM | POA: Diagnosis not present

## 2017-01-19 HISTORY — PX: ICD GENERATOR CHANGEOUT: EP1231

## 2017-01-19 LAB — SURGICAL PCR SCREEN
MRSA, PCR: NEGATIVE
Staphylococcus aureus: POSITIVE — AB

## 2017-01-19 LAB — PROTIME-INR
INR: 1.69
Prothrombin Time: 20.1 seconds — ABNORMAL HIGH (ref 11.4–15.2)

## 2017-01-19 SURGERY — ICD GENERATOR CHANGEOUT
Anesthesia: LOCAL

## 2017-01-19 MED ORDER — CEFAZOLIN SODIUM-DEXTROSE 2-4 GM/100ML-% IV SOLN
INTRAVENOUS | Status: AC
  Filled 2017-01-19: qty 100

## 2017-01-19 MED ORDER — SODIUM CHLORIDE 0.9 % IR SOLN
Status: DC | PRN
  Administered 2017-01-19: 13:00:00

## 2017-01-19 MED ORDER — MIDAZOLAM HCL 5 MG/5ML IJ SOLN
INTRAMUSCULAR | Status: AC
  Filled 2017-01-19: qty 5

## 2017-01-19 MED ORDER — SODIUM CHLORIDE 0.9 % IV SOLN
INTRAVENOUS | Status: DC
  Administered 2017-01-19: 09:00:00 via INTRAVENOUS

## 2017-01-19 MED ORDER — FENTANYL CITRATE (PF) 100 MCG/2ML IJ SOLN
INTRAMUSCULAR | Status: AC
  Filled 2017-01-19: qty 2

## 2017-01-19 MED ORDER — MUPIROCIN 2 % EX OINT
TOPICAL_OINTMENT | CUTANEOUS | Status: AC
  Administered 2017-01-19: 1 via TOPICAL
  Filled 2017-01-19: qty 22

## 2017-01-19 MED ORDER — LIDOCAINE HCL (PF) 1 % IJ SOLN
INTRAMUSCULAR | Status: DC | PRN
  Administered 2017-01-19: 45 mL

## 2017-01-19 MED ORDER — SODIUM CHLORIDE 0.9 % IR SOLN
Status: AC
  Filled 2017-01-19: qty 2

## 2017-01-19 MED ORDER — CHLORHEXIDINE GLUCONATE 4 % EX LIQD: 60.0000 mL | Freq: Once | CUTANEOUS | Status: DC

## 2017-01-19 MED ORDER — GENTAMICIN SULFATE 40 MG/ML IJ SOLN: 80.0000 mg | INTRAMUSCULAR | Status: DC

## 2017-01-19 MED ORDER — CEFAZOLIN SODIUM-DEXTROSE 2-4 GM/100ML-% IV SOLN
2.0000 g | INTRAVENOUS | Status: AC
  Administered 2017-01-19: 2 g via INTRAVENOUS
  Filled 2017-01-19: qty 100

## 2017-01-19 MED ORDER — MUPIROCIN 2 % EX OINT
1.0000 "application " | TOPICAL_OINTMENT | Freq: Once | CUTANEOUS | Status: AC
  Administered 2017-01-19: 1 via TOPICAL

## 2017-01-19 MED ORDER — LIDOCAINE HCL (PF) 1 % IJ SOLN
INTRAMUSCULAR | Status: AC
  Filled 2017-01-19: qty 60

## 2017-01-19 SURGICAL SUPPLY — 4 items
CABLE SURGICAL S-101-97-12 (CABLE) ×2 IMPLANT
ICD ELLIPSE VR CD1411-36C (ICD Generator) ×2 IMPLANT
PAD DEFIB LIFELINK (PAD) ×2 IMPLANT
TRAY PACEMAKER INSERTION (PACKS) ×2 IMPLANT

## 2017-01-19 NOTE — Discharge Instructions (Signed)

## 2017-01-19 NOTE — Progress Notes (Signed)
Pt PCR positive for staph. Pt given verbal and written instructions for mupirocin administration. Pt and wife verbalize understanding and deny further questions.  Pt given his mupirocin to take home

## 2017-01-19 NOTE — H&P (Signed)
PCP: Rory Percy, MD Primary Cardiologist: Dr Domenic Polite  The patient presents today for ICD generator change.  Since last being seen in our clinic, the patient reports doing very well.  Today, he denies symptoms of chest pain, orthopnea, PND, lower extremity edema, presyncope, syncope, or neurologic sequela.  The patient feels that he is tolerating medications without difficulties and is otherwise without complaint today.       Past Medical History:  Diagnosis Date  . A-V fistula ([place])   . Anterior myocardial infarction ([place])   . Chronic systolic heart failure ([place])   . Coronary atherosclerosis of native coronary artery    BMS to LAD an OM1 - patent at catheterization 11/13   . Hypertension   . Inducible ventricular tachycardia ([place])   . Ischemic cardiomyopathy    LVEF 30%, status post ICD  . Paroxysmal atrial fibrillation (HCC)    On coumadin  . Pseudoaneurysm ([place])   . Stroke [facility])         Past Surgical History:  Procedure Laterality Date  . AICD implantation  2011   St Jude Current ICD          Current Outpatient Prescriptions  Medication Sig Dispense Refill  . aspirin 81 MG tablet Take 81 mg by mouth daily.      . bisoprolol (ZEBETA) 5 MG tablet TAKE 1 TABLET (5 MG TOTAL) BY MOUTH DAILY. 90 tablet 3  . finasteride (PROSCAR) 5 MG tablet Take 5 mg by mouth Daily.     [name] losartan (COZAAR) 50 MG tablet TAKE 1 TABLET BY MOUTH EVERY DAY 30 tablet 6  . nitroGLYCERIN (NITROSTAT) 0.4 MG SL tablet Place 1 tablet (0.4 mg total) under the tongue every 5 (five) minutes as needed for chest pain. 25 tablet 3  . omega-3 acid ethyl esters (LOVAZA) 1 G capsule Take 1 g by mouth 2 (two) times daily.    . rosuvastatin (CRESTOR) 20 MG tablet TAKE 1 TABLET (20 MG TOTAL) BY MOUTH DAILY. 90 tablet 3  . spironolactone (ALDACTONE) 25 MG tablet TAKE 1 TABLET BY MOUTH EVERY DAY 30 tablet 6  . warfarin (COUMADIN) 5 MG tablet TAKE 1 TABLET DAILY EXCEPT 1/2 A TABLET ON  MONDAYS AND FRIDAYS 90 tablet 6   No current facility-administered medications for this visit.     No Known Allergies  Social History        Social History  . Marital status: Married    Spouse name: N/A  . Number of children: N/A  . Years of education: N/A      Occupational History  . Not on file.       Social History Main Topics  . Smoking status: Never Smoker  . Smokeless tobacco: Never Used  . Alcohol use No  . Drug use: No  . Sexual activity: Not on file       Other Topics Concern  . Not on file      Social History Narrative  . No narrative on file    Physical Exam:    Vitals:   09/23/16 1016  BP: 101/68  Pulse: (!) 52  SpO2: 95%  Weight: 236 lb (107 kg)  Height: 6\' 1"  (1.854 m)   GEN- The patient is well appearing, alert and oriented x 3 today.   Head- normocephalic, atraumatic Eyes-  Sclera clear, conjunctiva pink Ears- hearing intact Oropharynx- clear Neck- supple, no JVP Lymph- no cervical lymphadenopathy Lungs- Clear to ausculation bilaterally, normal work of breathing Chest- ICD  pocket is well healed Heart- Regular rate and rhythm, no murmurs, rubs or gallops, PMI not laterally displaced GI- soft, NT, ND, + BS Extremities- no clubbing, cyanosis, or edema  EKG today reveals sinus bradycardia 57 bpm, PVCs, QRS < 130 msec Echo 4/18 reveals EF 25%  Assessment and Plan:  1. CAD/ ischemic CM/ chronic systolic dysfunction ERI ICD battery status. Risks, benefits, and alternatives to ICD pulse generator replacement were discussed in detail today.  The patient understands that risks include but are not limited to bleeding, infection, pneumothorax, perforation, tamponade, vascular damage, renal failure, MI, stroke, death, inappropriate shocks, damage to his existing leads, and lead dislodgement and wishes to proceed at this time.  He has stable histograms with out symptomatic bradycardia.  QRS < 130 msec and thus does not meet  criteria for CRT.  2. afib Stable Will resume coumadin tonight will not bridge given bleeding risks In sinus today  3. Prior inducible VT Stable No change required today  ICD Criteria  Current LVEF:25%. Within 12 months prior to implant: Yes   Heart failure history: Yes, Class II  Cardiomyopathy history: Yes, Ischemic Cardiomyopathy - Prior MI.  Atrial Fibrillation/Atrial Flutter: Yes, Paroxysmal.  Ventricular tachycardia history:  Previously inducible VT  Cardiac arrest history: No.  History of syndromes with risk of sudden death: No.  Previous ICD: Yes, Reason for ICD:  Primary prevention.  Current ICD indication: Primary  PPM indication: No.   Class I or II Bradycardia indication present: No  Beta Blocker therapy for 3 or more months: No, medical reason.  Due to sinus bradycardia  Ace Inhibitor/ARB therapy for 3 or more months: Yes, prescribed.

## 2017-01-20 ENCOUNTER — Encounter (HOSPITAL_COMMUNITY): Payer: Self-pay | Admitting: Internal Medicine

## 2017-01-20 ENCOUNTER — Ambulatory Visit: Payer: BLUE CROSS/BLUE SHIELD | Attending: Cardiovascular Disease | Admitting: Cardiovascular Disease

## 2017-01-20 DIAGNOSIS — G4733 Obstructive sleep apnea (adult) (pediatric): Secondary | ICD-10-CM | POA: Diagnosis not present

## 2017-01-20 DIAGNOSIS — G4734 Idiopathic sleep related nonobstructive alveolar hypoventilation: Secondary | ICD-10-CM

## 2017-01-20 DIAGNOSIS — I4891 Unspecified atrial fibrillation: Secondary | ICD-10-CM | POA: Diagnosis not present

## 2017-01-23 ENCOUNTER — Other Ambulatory Visit: Payer: Self-pay | Admitting: Internal Medicine

## 2017-01-31 ENCOUNTER — Ambulatory Visit (INDEPENDENT_AMBULATORY_CARE_PROVIDER_SITE_OTHER): Payer: BLUE CROSS/BLUE SHIELD | Admitting: *Deleted

## 2017-01-31 DIAGNOSIS — I255 Ischemic cardiomyopathy: Secondary | ICD-10-CM | POA: Diagnosis not present

## 2017-01-31 DIAGNOSIS — I5022 Chronic systolic (congestive) heart failure: Secondary | ICD-10-CM

## 2017-01-31 LAB — CUP PACEART INCLINIC DEVICE CHECK
Brady Statistic RV Percent Paced: 0.65 %
HighPow Impedance: 48.8535
Implantable Pulse Generator Implant Date: 20180503
Lead Channel Impedance Value: 662.5 Ohm
Lead Channel Pacing Threshold Amplitude: 0.75 V
Lead Channel Pacing Threshold Pulse Width: 0.5 ms
Lead Channel Pacing Threshold Pulse Width: 0.5 ms
Lead Channel Setting Pacing Pulse Width: 0.5 ms
Lead Channel Setting Sensing Sensitivity: 0.5 mV
MDC IDC MSMT LEADCHNL RV PACING THRESHOLD AMPLITUDE: 0.75 V
MDC IDC MSMT LEADCHNL RV SENSING INTR AMPL: 12 mV
MDC IDC SESS DTM: 20180515152025
MDC IDC SET LEADCHNL RV PACING AMPLITUDE: 2.5 V
Pulse Gen Serial Number: 7421148

## 2017-01-31 NOTE — Progress Notes (Signed)
Wound check appointment. Steri-strips removed. Wound without redness or edema. Incision edges approximated, wound well healed. Normal device function. Threshold, sensing, and impedances consistent with implant measurements. Device programmed at appropriate safety margins. Histogram distribution appropriate for patient and level of activity. No ventricular arrhythmias noted. Patient educated about wound care, arm mobility, and shock plan. ROV in 3 months with JA. 

## 2017-02-06 ENCOUNTER — Ambulatory Visit (INDEPENDENT_AMBULATORY_CARE_PROVIDER_SITE_OTHER): Payer: BLUE CROSS/BLUE SHIELD | Admitting: *Deleted

## 2017-02-06 DIAGNOSIS — I6350 Cerebral infarction due to unspecified occlusion or stenosis of unspecified cerebral artery: Secondary | ICD-10-CM

## 2017-02-06 DIAGNOSIS — Z5181 Encounter for therapeutic drug level monitoring: Secondary | ICD-10-CM | POA: Diagnosis not present

## 2017-02-06 DIAGNOSIS — I4891 Unspecified atrial fibrillation: Secondary | ICD-10-CM | POA: Diagnosis not present

## 2017-02-06 DIAGNOSIS — Z7901 Long term (current) use of anticoagulants: Secondary | ICD-10-CM

## 2017-02-06 LAB — POCT INR: INR: 2.1

## 2017-02-26 NOTE — Procedures (Signed)
   Patient Name: [name], [name] Study Date: 01/20/2017 Gender: Male D.O.B: 06/21/1951 Age (years): 65 Referring Provider: Thomas Kelly MD, ABSM Height (inches): 73 Interpreting Physician: Thomas Kelly MD, ABSM Weight (lbs): 231 RPSGT: Peak, Robert BMI: 30 MRN: [id number] Neck Size: 18.00  CLINICAL INFORMATION The patient is referred for a split night study with BPAP.  MEDICATIONS aspirin 81 MG tablet bisoprolol (ZEBETA) 5 MG tablet calcium carbonate (TUMS - DOSED IN MG ELEMENTAL CALCIUM) 500 MG chewable tablet finasteride (PROSCAR) 5 MG tablet losartan (COZAAR) 50 MG tablet nitroGLYCERIN (NITROSTAT) 0.4 MG SL tablet omega-3 acid ethyl esters (LOVAZA) 1 G capsule rosuvastatin (CRESTOR) 20 MG tablet spironolactone (ALDACTONE) 25 MG tablet warfarin (COUMADIN) 5 MG tablet  Medications self-administered by patient taken the night of the study : N/A  SLEEP STUDY TECHNIQUE As per the AASM Manual for the Scoring of Sleep and Associated Events v2.3 (April 2016) with a hypopnea requiring 4% desaturations.  The channels recorded and monitored were frontal, central and occipital EEG, electrooculogram (EOG), submentalis EMG (chin), nasal and oral airflow, thoracic and abdominal wall motion, anterior tibialis EMG, snore microphone, electrocardiogram, and pulse oximetry. Bi-level positive airway pressure (BiPAP) was initiated when the patient met split night criteria and was titrated according to treat sleep-disordered breathing.  RESPIRATORY PARAMETERS  Diagnostic Total AHI (/hr): 68.7 RDI (/hr): 71.2 OA Index (/hr): 24.9 CA Index (/hr): 8.5 REM AHI (/hr): 53.3 NREM AHI (/hr): 69.8 Supine AHI (/hr): 69.2 Non-supine AHI (/hr): 66.67 Min O2 Sat (%): 72.00 Mean O2 (%): 92.21 Time below 88% (min): 14.0    Titration Optimal IPAP Pressure (cm):  Optimal EPAP Pressure (cm):  AHI at Optimal Pressure (/hr): N/A Min O2 at Optimal Pressure (%): 84.00 Sleep % at Optimal (%): N/A Supine %  at Optimal (%): N/A      SLEEP ARCHITECTURE The study was initiated at 9:49:40 PM and terminated at 5:03:38 AM. The total recorded time was 434.0 minutes. EEG confirmed total sleep time was 329.0 minutes yielding a sleep efficiency of 75.8%. Sleep onset after lights out was 33.3 minutes with a REM latency of 150.0 minutes. The patient spent 10.03% of the night in stage N1 sleep, 74.47% in stage N2 sleep, 0.00% in stage N3 and 15.50% in REM. Wake after sleep onset (WASO) was 71.7 minutes. The Arousal Index was 39.9/hour.  LEG MOVEMENT DATA The total Periodic Limb Movements of Sleep (PLMS) were 0. The PLMS index was 0.00 .  CARDIAC DATA The 2 lead EKG demonstrated sinus rhythm. The mean heart rate was 54.91 beats per minute. Other EKG findings include: PVCs.  IMPRESSIONS - Severe obstructive sleep apnea occurred during the diagnostic portion of the study (AHI = 68.7 /hour). An optimal BPAP pressure could not be selected for this patient based on the available study data. The patient was only titrated up to a BiPAP pressure of 12/6 and AHI remained elevated at 31.7/h with an oxygen nadir at 89%. - Moderate central sleep apnea occurred during the diagnostic portion of the study (CAI = 8.5/hour). - Moderate oxygen desaturation was noted during the diagnostic portion of the study to a nadir of 83% during NREM and 72% during REM sleep. - The patient snored with Loud snoring volume during the diagnostic portion of the study. - EKG findings include PVCs. - Clinically significant periodic limb movements of sleep did not occur during the study.  DIAGNOSIS - Obstructive Sleep Apnea (327.23 [G47.33 ICD-10])  RECOMMENDATIONS - Recommend an initial trial of BiPAP Auto with and   EPAP min of 7, PS of 5 and IPAP max of 25 with heated humidification.  - Effort should be made to optimize nasal and oral pharyngeal patency. - Effort should be made to optimize the patient's volume status in this patient with a  significant cardiomyopathy and central apneic events. - Avoid alcohol, sedatives and other CNS depressants that may worsen sleep apnea and disrupt normal sleep architecture. - Sleep hygiene should be reviewed to assess factors that may improve sleep quality. - Weight management and regular exercise should be initiated or continued. - Recommend a download be obtained in 2-4 weeks to assess initial and subsequent efficacy and plan for sleep clinic evaluation after 4 weeks of therapy.  [Electronically signed] 02/26/2017 02:22 PM  Thomas Kelly MD, FACC, ABSM Diplomate, American Board of Sleep Medicine   NPI: [id number] [facility] PH: [phone]   FX: [phone] ACCREDITED BY THE AMERICAN ACADEMY OF SLEEP MEDICINE  

## 2017-02-28 ENCOUNTER — Telehealth: Payer: Self-pay | Admitting: *Deleted

## 2017-02-28 NOTE — Telephone Encounter (Signed)
Faxed BIPAP referral to [facility].

## 2017-02-28 NOTE — Telephone Encounter (Signed)
Patient notified of sleep study results and recommendations. Voiced verbal understanding.

## 2017-02-28 NOTE — Progress Notes (Signed)
Patient referred to [facility] for BIPAP set up.

## 2017-03-16 ENCOUNTER — Ambulatory Visit: Payer: BLUE CROSS/BLUE SHIELD | Admitting: Cardiovascular Disease

## 2017-03-20 ENCOUNTER — Ambulatory Visit (INDEPENDENT_AMBULATORY_CARE_PROVIDER_SITE_OTHER): Payer: BLUE CROSS/BLUE SHIELD | Admitting: *Deleted

## 2017-03-20 DIAGNOSIS — I6350 Cerebral infarction due to unspecified occlusion or stenosis of unspecified cerebral artery: Secondary | ICD-10-CM

## 2017-03-20 DIAGNOSIS — Z7901 Long term (current) use of anticoagulants: Secondary | ICD-10-CM | POA: Diagnosis not present

## 2017-03-20 DIAGNOSIS — Z5181 Encounter for therapeutic drug level monitoring: Secondary | ICD-10-CM | POA: Diagnosis not present

## 2017-03-20 DIAGNOSIS — I4891 Unspecified atrial fibrillation: Secondary | ICD-10-CM | POA: Diagnosis not present

## 2017-03-20 LAB — POCT INR: INR: 2.8

## 2017-03-21 ENCOUNTER — Encounter (HOSPITAL_COMMUNITY)
Admission: RE | Admit: 2017-03-21 | Discharge: 2017-03-21 | Disposition: A | Discharge status: Home / Self Care | Payer: BLUE CROSS/BLUE SHIELD | Source: Ambulatory Visit | Attending: Ophthalmology | Admitting: Ophthalmology

## 2017-03-21 ENCOUNTER — Encounter (HOSPITAL_COMMUNITY): Payer: Self-pay

## 2017-03-27 ENCOUNTER — Ambulatory Visit (HOSPITAL_COMMUNITY): Payer: BLUE CROSS/BLUE SHIELD | Admitting: Anesthesiology

## 2017-03-27 ENCOUNTER — Encounter (HOSPITAL_COMMUNITY)
Admission: RE | Disposition: A | Discharge status: Home / Self Care | Payer: Self-pay | Source: Ambulatory Visit | Attending: Ophthalmology

## 2017-03-27 ENCOUNTER — Ambulatory Visit (INDEPENDENT_AMBULATORY_CARE_PROVIDER_SITE_OTHER): Payer: BLUE CROSS/BLUE SHIELD | Admitting: *Deleted

## 2017-03-27 ENCOUNTER — Encounter (HOSPITAL_COMMUNITY): Payer: Self-pay | Admitting: *Deleted

## 2017-03-27 ENCOUNTER — Ambulatory Visit (HOSPITAL_COMMUNITY)
Admission: RE | Admit: 2017-03-27 | Discharge: 2017-03-27 | Disposition: A | Discharge status: Home / Self Care | Payer: BLUE CROSS/BLUE SHIELD | Source: Ambulatory Visit | Attending: Ophthalmology | Admitting: Ophthalmology

## 2017-03-27 DIAGNOSIS — I2510 Atherosclerotic heart disease of native coronary artery without angina pectoris: Secondary | ICD-10-CM | POA: Insufficient documentation

## 2017-03-27 DIAGNOSIS — I252 Old myocardial infarction: Secondary | ICD-10-CM | POA: Insufficient documentation

## 2017-03-27 DIAGNOSIS — I255 Ischemic cardiomyopathy: Secondary | ICD-10-CM | POA: Diagnosis not present

## 2017-03-27 DIAGNOSIS — G4730 Sleep apnea, unspecified: Secondary | ICD-10-CM | POA: Insufficient documentation

## 2017-03-27 DIAGNOSIS — Z79899 Other long term (current) drug therapy: Secondary | ICD-10-CM | POA: Diagnosis not present

## 2017-03-27 DIAGNOSIS — I4891 Unspecified atrial fibrillation: Secondary | ICD-10-CM | POA: Insufficient documentation

## 2017-03-27 DIAGNOSIS — Z7901 Long term (current) use of anticoagulants: Secondary | ICD-10-CM | POA: Diagnosis not present

## 2017-03-27 DIAGNOSIS — Z7982 Long term (current) use of aspirin: Secondary | ICD-10-CM | POA: Insufficient documentation

## 2017-03-27 DIAGNOSIS — I110 Hypertensive heart disease with heart failure: Secondary | ICD-10-CM | POA: Insufficient documentation

## 2017-03-27 DIAGNOSIS — Z8673 Personal history of transient ischemic attack (TIA), and cerebral infarction without residual deficits: Secondary | ICD-10-CM | POA: Diagnosis not present

## 2017-03-27 DIAGNOSIS — H25812 Combined forms of age-related cataract, left eye: Secondary | ICD-10-CM | POA: Insufficient documentation

## 2017-03-27 HISTORY — DX: Presence of automatic (implantable) cardiac defibrillator: Z95.810

## 2017-03-27 HISTORY — PX: CATARACT EXTRACTION W/PHACO: SHX586

## 2017-03-27 SURGERY — PHACOEMULSIFICATION, CATARACT, WITH IOL INSERTION
Anesthesia: Monitor Anesthesia Care | Site: Eye | Laterality: Left

## 2017-03-27 MED ORDER — PROVISC 10 MG/ML IO SOLN
INTRAOCULAR | Status: DC | PRN
  Administered 2017-03-27: 0.85 mL via INTRAOCULAR

## 2017-03-27 MED ORDER — LIDOCAINE HCL (PF) 1 % IJ SOLN
INTRAMUSCULAR | Status: DC | PRN
Start: 2017-03-27 — End: 2017-03-27
  Administered 2017-03-27: .5 mL

## 2017-03-27 MED ORDER — FENTANYL CITRATE (PF) 100 MCG/2ML IJ SOLN
INTRAMUSCULAR | Status: AC
  Filled 2017-03-27: qty 2

## 2017-03-27 MED ORDER — LACTATED RINGERS IV SOLN
INTRAVENOUS | Status: DC
  Administered 2017-03-27: 12:00:00 via INTRAVENOUS

## 2017-03-27 MED ORDER — LIDOCAINE HCL 3.5 % OP GEL
1.0000 "application " | Freq: Once | OPHTHALMIC | Status: AC
  Administered 2017-03-27: 1 via OPHTHALMIC

## 2017-03-27 MED ORDER — NEOMYCIN-POLYMYXIN-DEXAMETH 3.5-10000-0.1 OP SUSP
OPHTHALMIC | Status: DC | PRN
  Administered 2017-03-27: 2 [drp] via OPHTHALMIC

## 2017-03-27 MED ORDER — POVIDONE-IODINE 5 % OP SOLN
OPHTHALMIC | Status: DC | PRN
  Administered 2017-03-27: 1 via OPHTHALMIC

## 2017-03-27 MED ORDER — FENTANYL CITRATE (PF) 100 MCG/2ML IJ SOLN
25.0000 ug | Freq: Once | INTRAMUSCULAR | Status: AC
  Administered 2017-03-27: 25 ug via INTRAVENOUS

## 2017-03-27 MED ORDER — BSS IO SOLN
INTRAOCULAR | Status: DC | PRN
  Administered 2017-03-27: 500 mL

## 2017-03-27 MED ORDER — MIDAZOLAM HCL 2 MG/2ML IJ SOLN
INTRAMUSCULAR | Status: AC
  Filled 2017-03-27: qty 2

## 2017-03-27 MED ORDER — PHENYLEPHRINE HCL 2.5 % OP SOLN
1.0000 [drp] | OPHTHALMIC | Status: AC
  Administered 2017-03-27 (×3): 1 [drp] via OPHTHALMIC

## 2017-03-27 MED ORDER — CYCLOPENTOLATE-PHENYLEPHRINE 0.2-1 % OP SOLN
1.0000 [drp] | OPHTHALMIC | Status: AC
  Administered 2017-03-27 (×3): 1 [drp] via OPHTHALMIC

## 2017-03-27 MED ORDER — TETRACAINE HCL 0.5 % OP SOLN
1.0000 [drp] | OPHTHALMIC | Status: AC
  Administered 2017-03-27 (×3): 1 [drp] via OPHTHALMIC

## 2017-03-27 MED ORDER — BSS IO SOLN
INTRAOCULAR | Status: DC | PRN
  Administered 2017-03-27: 15 mL

## 2017-03-27 MED ORDER — MIDAZOLAM HCL 2 MG/2ML IJ SOLN
1.0000 mg | INTRAMUSCULAR | Status: AC
  Administered 2017-03-27: 1 mg via INTRAVENOUS

## 2017-03-27 SURGICAL SUPPLY — 10 items
CLOTH BEACON ORANGE TIMEOUT ST (SAFETY) ×3 IMPLANT
EYE SHIELD UNIVERSAL CLEAR (GAUZE/BANDAGES/DRESSINGS) ×3 IMPLANT
GLOVE BIOGEL PI IND STRL 7.0 (GLOVE) ×1 IMPLANT
GLOVE BIOGEL PI INDICATOR 7.0 (GLOVE) ×2
LENS ALC ACRYL/TECN (Ophthalmic Related) ×3 IMPLANT
PAD ARMBOARD 7.5X6 YLW CONV (MISCELLANEOUS) ×3 IMPLANT
SYRINGE LUER LOK 1CC (MISCELLANEOUS) ×3 IMPLANT
TAPE SURG TRANSPORE 1 IN (GAUZE/BANDAGES/DRESSINGS) ×1 IMPLANT
TAPE SURGICAL TRANSPORE 1 IN (GAUZE/BANDAGES/DRESSINGS) ×2
WATER STERILE IRR 250ML POUR (IV SOLUTION) ×3 IMPLANT

## 2017-03-27 NOTE — H&P (Signed)
I have reviewed the H&P, the patient was re-examined, and I have identified no interval changes in medical condition and plan of care since the history and physical of record  

## 2017-03-27 NOTE — Discharge Instructions (Signed)

## 2017-03-27 NOTE — Op Note (Signed)
Date of Admission: 03/27/2017  Date of Surgery: 03/27/2017  Pre-Op Dx: Cataract Left  Eye  Post-Op Dx: Senile Combined Cataract  Left  Eye,  Dx Code W29.562  Surgeon: [name], M.D.  Assistants: None  Anesthesia: Topical with MAC  Indications: Painless, progressive loss of vision with compromise of daily activities.  Surgery: Cataract Extraction with Intraocular lens Implant Left Eye  Discription: The patient had dilating drops and viscous lidocaine placed into the Left eye in the pre-op holding area. After transfer to the operating room, a time out was performed. The patient was then prepped and draped. Beginning with a 82m blade a paracentesis port was made at the surgeon's 2 o'clock position. The anterior chamber was then filled with 1% non-preserved lidocaine. This was followed by filling the anterior chamber with Provisc.  A 2.438mkeratome blade was used to make a clear corneal incision at the temporal limbus.  A bent cystatome needle was used to create a continuous tear capsulotomy. Hydrodissection was performed with balanced salt solution on a Fine canula. The lens nucleus was then removed using the phacoemulsification handpiece. Residual cortex was removed with the I&A handpiece. The anterior chamber and capsular bag were refilled with Provisc. A posterior chamber intraocular lens was placed into the capsular bag with it's injector. The implant was positioned with the Kuglan hook. The Provisc was then removed from the anterior chamber and capsular bag with the I&A handpiece. Stromal hydration of the main incision and paracentesis port was performed with BSS on a Fine canula. The wounds were tested for leak which was negative. The patient tolerated the procedure well. There were no operative complications. The patient was then transferred to the recovery room in stable condition.  Complications: None  Specimen: None  EBL: None  Prosthetic device: Abbott Technis, PCB00, power 20.5, SN  [id number]

## 2017-03-27 NOTE — Anesthesia Postprocedure Evaluation (Signed)
Anesthesia Post Note  Patient: DOVBER ERNEST  Procedure(s) Performed: Procedure(s) (LRB): CATARACT EXTRACTION PHACO AND INTRAOCULAR LENS PLACEMENT (IOC) (Left)  Patient location during evaluation: Short Stay Anesthesia Type: MAC Level of consciousness: awake and alert and oriented Pain management: pain level controlled Vital Signs Assessment: post-procedure vital signs reviewed and stable Respiratory status: spontaneous breathing Cardiovascular status: blood pressure returned to baseline Postop Assessment: no signs of nausea or vomiting Anesthetic complications: no     Last Vitals:  Vitals:   03/27/17 1123 03/27/17 1135  BP:  113/69  Resp:  14  Temp: 36.9 C 36.8 C    Last Pain:  Vitals:   03/27/17 1123  TempSrc: Oral                 Amadea Keagy

## 2017-03-27 NOTE — Transfer of Care (Signed)
Immediate Anesthesia Transfer of Care Note  Patient: [name]  Procedure(s) Performed: Procedure(s) with comments: CATARACT EXTRACTION PHACO AND INTRAOCULAR LENS PLACEMENT (IOC) (Left) - CDE: 8.48  Patient Location: Short Stay  Anesthesia Type:MAC  Level of Consciousness: awake  Airway & Oxygen Therapy: Patient Spontanous Breathing  Post-op Assessment: Report given to RN  Post vital signs: Reviewed  Last Vitals:  Vitals:   03/27/17 1123 03/27/17 1135  BP:  113/69  Resp:  14  Temp: 36.9 C 36.8 C    Last Pain:  Vitals:   03/27/17 1123  TempSrc: Oral      Patients Stated Pain Goal: 5 (36/[id number])  Complications: No apparent anesthesia complications

## 2017-03-27 NOTE — Anesthesia Procedure Notes (Signed)
Procedure Name: MAC Date/Time: 03/27/2017 12:27 PM Performed by: Vista Deck Pre-anesthesia Checklist: Patient identified, Emergency Drugs available, Suction available, Timeout performed and Patient being monitored Patient Re-evaluated:Patient Re-evaluated prior to inductionOxygen Delivery Method: Nasal Cannula

## 2017-03-27 NOTE — Anesthesia Preprocedure Evaluation (Signed)
Anesthesia Evaluation  Patient identified by MRN, date of birth, ID band Patient awake    Reviewed: Allergy & Precautions, NPO status , Patient's Chart, lab work & pertinent test results  Airway Mallampati: II  TM Distance: >3 FB Neck ROM: Full    Dental  (+) Teeth Intact   Pulmonary sleep apnea ,    breath sounds clear to auscultation       Cardiovascular hypertension, Pt. on medications + CAD, + Past MI and +CHF  + dysrhythmias Atrial Fibrillation + Cardiac Defibrillator  Rhythm:Regular Rate:Normal     Neuro/Psych CVA    GI/Hepatic negative GI ROS,   Endo/Other    Renal/GU      Musculoskeletal   Abdominal   Peds  Hematology   Anesthesia Other Findings   Reproductive/Obstetrics                             Anesthesia Physical Anesthesia Plan  ASA: IV  Anesthesia Plan: MAC   Post-op Pain Management:    Induction: Intravenous  PONV Risk Score and Plan:   Airway Management Planned: Nasal Cannula  Additional Equipment:   Intra-op Plan:   Post-operative Plan:   Informed Consent: I have reviewed the patients History and Physical, chart, labs and discussed the procedure including the risks, benefits and alternatives for the proposed anesthesia with the patient or authorized representative who has indicated his/her understanding and acceptance.     Plan Discussed with:   Anesthesia Plan Comments:         Anesthesia Quick Evaluation  

## 2017-03-28 ENCOUNTER — Encounter (HOSPITAL_COMMUNITY): Payer: Self-pay | Admitting: Ophthalmology

## 2017-03-28 NOTE — Progress Notes (Signed)
Remote ICD transmission.   

## 2017-03-29 LAB — CUP PACEART REMOTE DEVICE CHECK
Battery Remaining Percentage: 95.5 %
Battery Voltage: 3.2 V
Date Time Interrogation Session: 20180709090612
HIGH POWER IMPEDANCE MEASURED VALUE: 49 Ohm
HighPow Impedance: 49 Ohm
Lead Channel Impedance Value: 650 Ohm
Lead Channel Pacing Threshold Pulse Width: 0.5 ms
MDC IDC MSMT BATTERY REMAINING LONGEVITY: 102 mo
MDC IDC MSMT LEADCHNL RV PACING THRESHOLD AMPLITUDE: 0.75 V
MDC IDC MSMT LEADCHNL RV SENSING INTR AMPL: 12 mV
MDC IDC PG IMPLANT DT: 20180503
MDC IDC PG SERIAL: 7421148
MDC IDC SET LEADCHNL RV PACING AMPLITUDE: 2.5 V
MDC IDC SET LEADCHNL RV PACING PULSEWIDTH: 0.5 ms
MDC IDC SET LEADCHNL RV SENSING SENSITIVITY: 0.5 mV
MDC IDC STAT BRADY RV PERCENT PACED: 1.1 %

## 2017-04-03 ENCOUNTER — Encounter: Payer: Self-pay | Admitting: Cardiology

## 2017-04-13 ENCOUNTER — Other Ambulatory Visit (HOSPITAL_COMMUNITY): Payer: BLUE CROSS/BLUE SHIELD

## 2017-04-18 ENCOUNTER — Ambulatory Visit (INDEPENDENT_AMBULATORY_CARE_PROVIDER_SITE_OTHER): Payer: BLUE CROSS/BLUE SHIELD | Admitting: Urology

## 2017-04-18 DIAGNOSIS — N401 Enlarged prostate with lower urinary tract symptoms: Secondary | ICD-10-CM

## 2017-04-18 NOTE — Progress Notes (Signed)
Cardiology Office Note  Date: 04/19/2017   ID: [name], DOB 08-14-1951, MRN [id number]  PCP: Rory Percy, MD  Primary Cardiologist: Rozann Lesches, MD   Chief Complaint  Patient presents with  . Cardiomyopathy    History of Present Illness: [name] is a 66 y.o. male last seen in February. He presents today for a routine follow-up visit. States that he has been doing well, reports NYHA class II dyspnea, no angina or palpitations, no syncope. He has been busy taking care of grandchildren, enjoying retirement overall. He plans to travel with his wife during football season to see [place] play.  He continues on Coumadin with follow-up in the anticoagulation clinic. Reports no bleeding problems.  He follows with [name] in the device clinic, [place] ICD in place. He underwent a generator change in May.  He did undergo sleep study and will be seeing Dr. [name] next week. He has been using a new mask and tolerating it so far. He has fairly severe sleep apnea.  Follow-up echocardiogram from April is outlined below. LVEF is 25-30% at this point. He runs a low blood pressure at baseline but tolerates it well. I have been somewhat hesitant to try and switch him to [facility] from Cozaar, but this is not out of the question since he is tolerating Cozaar 50 mg daily. He wanted to hold off on making any switch at this time.  Past Medical History:  Diagnosis Date  . A-V fistula ([place])   . AICD (automatic cardioverter/defibrillator) present 2006   ICD  . Anterior myocardial infarction ([place])   . Chronic systolic heart failure ([place])   . Coronary atherosclerosis of native coronary artery    BMS to LAD an OM1 - patent at catheterization 11/13   . Hypertension   . 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 Ischemic cardiomyopathy    LVEF 30%, status post ICD  . Paroxysmal atrial fibrillation ([facility])    On coumadin  . Pseudoaneurysm ([place])   . Sleep apnea    Unable to wear  CPAP  . Stroke [facility])     Past Surgical History:  Procedure Laterality Date  . AICD implantation  2011   St Jude Current ICD  . CATARACT EXTRACTION W/PHACO Left 03/27/2017   Procedure: CATARACT EXTRACTION PHACO AND INTRAOCULAR LENS PLACEMENT (IOC);  Surgeon: [name], MD;  Location: [facility];  Service: Ophthalmology;  Laterality: Left;  CDE: 8.48  . ICD GENERATOR CHANGEOUT N/A 01/19/2017   Procedure: ICD Generator Changeout;  Surgeon: Thompson Grayer, MD;  Location: [facility];  Service: Cardiovascular;  Laterality: N/A;    Current Outpatient Prescriptions  Medication Sig Dispense Refill  . aspirin 81 MG tablet Take 81 mg by mouth daily.      . bisoprolol (ZEBETA) 5 MG tablet TAKE 1 TABLET (5 MG TOTAL) BY MOUTH DAILY. (Patient taking differently: TAKE 1 TABLET (5 MG TOTAL) BY MOUTH DAILY AT BEDTIME) 90 tablet 3  . calcium carbonate (TUMS - DOSED IN MG ELEMENTAL CALCIUM) 500 MG chewable tablet Chew 1-2 tablets by mouth daily as needed for indigestion or heartburn.    . finasteride (PROSCAR) 5 MG tablet Take 5 mg by mouth Daily.     [name] losartan (COZAAR) 50 MG tablet TAKE 1 TABLET BY MOUTH DAILY 90 tablet 3  . nitroGLYCERIN (NITROSTAT) 0.4 MG SL tablet PLACE 1 TABLET (0.4 MG TOTAL) UNDER THE TONGUE EVERY 5 (FIVE) MINUTES AS NEEDED FOR CHEST PAIN. 25 tablet  0  . omega-3 acid ethyl esters (LOVAZA) 1 G capsule Take 1 g by mouth 2 (two) times daily.    . rosuvastatin (CRESTOR) 20 MG tablet TAKE 1 TABLET (20 MG TOTAL) BY MOUTH DAILY. (Patient taking differently: TAKE 1 TABLET (20 MG TOTAL) BY MOUTH AT BEDTIME) 90 tablet 2  . spironolactone (ALDACTONE) 25 MG tablet TAKE 1 TABLET BY MOUTH DAILY 90 tablet 3  . warfarin (COUMADIN) 5 MG tablet TAKE 1 TABLET DAILY EXCEPT 1/2 A TABLET ON MONDAYS AND FRIDAYS (Patient taking differently: TAKE 1 TABLET (5 mg) DAILY IN THE EVENING EXCEPT 1/2 (2.5 mg)  TABLET ON MONDAYS AND FRIDAYS) 90 tablet 6   No current facility-administered medications for this visit.      Allergies:  Patient has no known allergies.   Social History: The patient  reports that he has never smoked. He has never used smokeless tobacco. He reports that he drinks alcohol. He reports that he does not use drugs.   ROS:  Please see the history of present illness. Otherwise, complete review of systems is positive for urinary frequency and nocturia - has had urological evaluation.  All other systems are reviewed and negative.   Physical Exam: VS:  BP 90/60   Pulse 62   Ht 6\' 1"  (1.854 m)   Wt 233 lb 12.8 oz (106.1 kg)   SpO2 99%   BMI 30.85 kg/m , BMI Body mass index is 30.85 kg/m.  Wt Readings from Last 3 Encounters:  04/19/17 233 lb 12.8 oz (106.1 kg)  03/27/17 231 lb (104.8 kg)  03/21/17 231 lb (104.8 kg)    Obese male. Appears comfortable at rest.  HEENT: Conjunctiva and lids normal, oropharynx clear.  Neck: Supple, no elevated JVP or carotid bruits, no thyromegaly.  Lungs: Clear to auscultation, nonlabored breathing at rest.  Thorax: Stable device pocket site.  Cardiac: RRR with ectopy, no S3 or significant systolic murmur, no pericardial rub.  Abdomen: Soft, nontender, bowel sounds present.  Extremities: No pitting edema, distal pulses 1+.  Skin: Warm and dry.  Musculoskeletal: No kyphosis. Neuropsychiatric: Alert and oriented 3, affect appropriate.  ECG: I personally reviewed the tracing from 01/19/2017 which showed sinus bradycardia with ventricular pacing.  Recent Labwork: 01/11/2017: BUN 19; Creatinine, Ser 1.09; Hemoglobin 15.3; Platelets 158; Potassium 4.6; Sodium 140   Other Studies Reviewed Today:  Echocardiogram 01/09/2017: Study Conclusions  - Left ventricle: The cavity size was normal. Wall thickness was   increased in a pattern of mild LVH. Systolic function was   severely reduced. The estimated ejection fraction was in the   range of 25% to 30%. There is akinesis of the   mid-apicalanteroseptal, anterior, and apical myocardium. There is    akinesis of the apicalinferior myocardium. No definite formed LV   mural thrombus noted. There is significant low flow and swirling   noted at the apex with Definity contrast. Doppler parameters are   consistent with abnormal left ventricular relaxation (grade 1   diastolic dysfunction). - Aortic valve: Trileaflet; mildly calcified leaflets. - Mitral valve: There was trivial regurgitation. - Left atrium: The atrium was mildly to moderately dilated. - Right ventricle: Pacer wire or catheter noted in right ventricle. - Right atrium: The atrium was mildly dilated. - Tricuspid valve: There was trivial regurgitation. - Pulmonary arteries: PA peak pressure: 26 mm Hg (S). - Pericardium, extracardiac: There was no pericardial effusion.  Impressions:  - Mild LVH with LVEF 25-30%. Large region of mid to apical   anteroseptal,  anterior, and inferior apical akinesis consistent   with ischemic cardiomyopathy. No definite formed LV mural   thrombus noted. There is significant low flow and swirling noted   at the apex with Definity contrast. Grade 1 diastolic   dysfunction. Trivial mitral regurgitation. Mild to moderate left   atrial enlargement. Mildly sclerotic aortic valve. Device wire   noted within the right heart. Trivial tricuspid regurgitation   with PASP 26 mmHg.  Assessment and Plan:  1. Ischemic cardiomyopathy with LVEF 25-30%. He is on stable medical therapy. We did discuss possibility of switching from Cozaar to [facility], although his low blood pressure may limit transition. He does tolerate Cozaar 50 mg daily however. At this point he did not want to make any adjustments.  2. Chronic systolic heart failure, currently stable symptomatically with NYHA class II dyspnea, no evidence of fluid overload on examination.  3. St. Jude ICD, follows with [name]. He had a generator change out in May. No device shocks.  4. Severe obstructive sleep apnea, now using a different mask for  treatment, follow-up to see Dr. [name] scheduled for next week.  5. Paroxysmal atrial fibrillation, continues on Coumadin with follow-up in the anticoagulation clinic.  Current medicines were reviewed with the patient today.  Disposition: Follow-up in 6 months, sooner if needed.  Signed, Satira Sark, MD, [facility] 04/19/2017 4:05 PM    [facility] [place], [place], [place] [address] Phone: [phone]; Fax: [phone]

## 2017-04-19 ENCOUNTER — Ambulatory Visit: Payer: BLUE CROSS/BLUE SHIELD | Admitting: Cardiology

## 2017-04-19 ENCOUNTER — Encounter: Payer: Self-pay | Admitting: Cardiology

## 2017-04-19 ENCOUNTER — Ambulatory Visit (INDEPENDENT_AMBULATORY_CARE_PROVIDER_SITE_OTHER): Payer: BLUE CROSS/BLUE SHIELD | Admitting: Cardiology

## 2017-04-19 VITALS — BP 90/60 | HR 62 | Ht 73.0 in | Wt 233.8 lb

## 2017-04-19 DIAGNOSIS — G4733 Obstructive sleep apnea (adult) (pediatric): Secondary | ICD-10-CM

## 2017-04-19 DIAGNOSIS — I5022 Chronic systolic (congestive) heart failure: Secondary | ICD-10-CM | POA: Diagnosis not present

## 2017-04-19 DIAGNOSIS — Z95810 Presence of automatic (implantable) cardiac defibrillator: Secondary | ICD-10-CM

## 2017-04-19 DIAGNOSIS — I480 Paroxysmal atrial fibrillation: Secondary | ICD-10-CM

## 2017-04-19 DIAGNOSIS — I255 Ischemic cardiomyopathy: Secondary | ICD-10-CM | POA: Diagnosis not present

## 2017-04-19 NOTE — Patient Instructions (Signed)

## 2017-04-24 ENCOUNTER — Ambulatory Visit (INDEPENDENT_AMBULATORY_CARE_PROVIDER_SITE_OTHER): Payer: BLUE CROSS/BLUE SHIELD | Admitting: Cardiovascular Disease

## 2017-04-24 ENCOUNTER — Encounter: Payer: Self-pay | Admitting: Cardiovascular Disease

## 2017-04-24 VITALS — BP 98/64 | HR 47 | Ht 73.0 in | Wt 233.4 lb

## 2017-04-24 DIAGNOSIS — G4731 Primary central sleep apnea: Secondary | ICD-10-CM | POA: Diagnosis not present

## 2017-04-24 DIAGNOSIS — Z95810 Presence of automatic (implantable) cardiac defibrillator: Secondary | ICD-10-CM | POA: Diagnosis not present

## 2017-04-24 DIAGNOSIS — I480 Paroxysmal atrial fibrillation: Secondary | ICD-10-CM | POA: Diagnosis not present

## 2017-04-24 DIAGNOSIS — I255 Ischemic cardiomyopathy: Secondary | ICD-10-CM

## 2017-04-24 NOTE — Patient Instructions (Addendum)
Follow-Up: Your physician wants you to follow-up in: 6 MONTHS with Dr. [name] (sleep clinic). You will receive a reminder letter in the mail two months in advance. If you don't receive a letter, please call our office to schedule the follow-up appointment.  Any Other Special Instructions Will Be Listed Below (If Applicable).     If you need a refill on your cardiac medications before your next appointment, please call your pharmacy.

## 2017-04-24 NOTE — Progress Notes (Signed)
Cardiology Office Note    Date:  04/24/2017   ID:  [name], DOB August 22, 1951, MRN [id number]  PCP:  Rory Percy, MD  Cardiologist:  Shelva Majestic, MD (sleep); Dr. Johnny Bridge  No sleep clinic evaluation  History of Present Illness:  [name] is a 66 y.o. male who presents for sleep clinic evaluation following initiation of BiPAP therapy for complex sleep apnea.  [name] has a history of ischemic heart myopathy, has known CAD, and is status post ICD therapy for reduced LV function.  His echo Doppler study in April 2018 showed an EF of 25-30%.  He had recently been referred for sleep study.  Due to concerns for significant sleep apnea.  The study was done on 01/20/2017.  He was found to have severe obstructive sleep apnea with an HI of 68.7 per hour.  He was instituted and he was changed to BiPAP therapy.  BiPAP unfortunately was only titrated to a pressure of 12/6 and his AHI remains significantly elevated at 31.7.  He also had moderate central sleep apnea with the central apnea index of 8.5.  On the diagnostic portion of the study had significant oxygen desaturation to a nadir of 72% and there was evidence for loud snoring.  His ECG revealed occasional PVCs.  He has started on BiPAP therapy at home and initially BiPAP auto was recommended with a minimum EPAP of 9 and a pressure support of 5.  I obtained a download today from 03/24/2017 through 04/19/2017.  His AHI remains elevated at 19.8/h, of which Central apnea index was 13.2/h with obstructive 2.2/h and unknown 4.1/h.  He has been using a ResMed mask.  He feels improved since initiating BiPAP therapy and is unaware of snoring.  He still notes some fatigue.   Epworth Sleepiness Scale: Situation   Chance of Dozing/Sleeping (0 = never , 1 = slight chance , 2 = moderate chance , 3 = high chance )   sitting and reading 0   watching TV 1   sitting inactive in a public place 1   being a passenger in a motor vehicle for an hour  or more 1   lying down in the afternoon 2   sitting and talking to someone 0   sitting quietly after lunch (no alcohol) 1   while stopped for a few minutes in traffic as the driver 0   Total Score  6   He presents for his initial sleep evaluation.  Past Medical History:  Diagnosis Date  . A-V fistula ([place])   . AICD (automatic cardioverter/defibrillator) present 2006   ICD  . Anterior myocardial infarction ([place])   . Chronic systolic heart failure ([place])   . Coronary atherosclerosis of native coronary artery    BMS to LAD an OM1 - patent at catheterization 11/13   . Hypertension   . Inducible ventricular tachycardia ([place])   . Ischemic cardiomyopathy    LVEF 30%, status post ICD  . Paroxysmal atrial fibrillation ([facility])    On coumadin  . Pseudoaneurysm ([place])   . Sleep apnea    Unable to wear CPAP  . Stroke [facility])     Past Surgical History:  Procedure Laterality Date  . AICD implantation  2011   St Jude Current ICD  . CATARACT EXTRACTION W/PHACO Left 03/27/2017   Procedure: CATARACT EXTRACTION PHACO AND INTRAOCULAR LENS PLACEMENT (IOC);  Surgeon: [name], MD;  Location: [facility];  Service: Ophthalmology;  Laterality: Left;  CDE: 8.48  .  ICD GENERATOR CHANGEOUT N/A 01/19/2017   Procedure: ICD Generator Changeout;  Surgeon: Thompson Grayer, MD;  Location: [facility];  Service: Cardiovascular;  Laterality: N/A;    Current Medications: Outpatient Medications Prior to Visit  Medication Sig Dispense Refill  . aspirin 81 MG tablet Take 81 mg by mouth daily.      . bisoprolol (ZEBETA) 5 MG tablet TAKE 1 TABLET (5 MG TOTAL) BY MOUTH DAILY. (Patient taking differently: TAKE 1 TABLET (5 MG TOTAL) BY MOUTH DAILY AT BEDTIME) 90 tablet 3  . calcium carbonate (TUMS - DOSED IN MG ELEMENTAL CALCIUM) 500 MG chewable tablet Chew 1-2 tablets by mouth daily as needed for indigestion or heartburn.    . finasteride (PROSCAR) 5 MG tablet Take 5 mg by mouth Daily.     [name] losartan (COZAAR) 50 MG  tablet TAKE 1 TABLET BY MOUTH DAILY 90 tablet 3  . nitroGLYCERIN (NITROSTAT) 0.4 MG SL tablet PLACE 1 TABLET (0.4 MG TOTAL) UNDER THE TONGUE EVERY 5 (FIVE) MINUTES AS NEEDED FOR CHEST PAIN. 25 tablet 0  . omega-3 acid ethyl esters (LOVAZA) 1 G capsule Take 1 g by mouth 2 (two) times daily.    . rosuvastatin (CRESTOR) 20 MG tablet TAKE 1 TABLET (20 MG TOTAL) BY MOUTH DAILY. (Patient taking differently: TAKE 1 TABLET (20 MG TOTAL) BY MOUTH AT BEDTIME) 90 tablet 2  . spironolactone (ALDACTONE) 25 MG tablet TAKE 1 TABLET BY MOUTH DAILY 90 tablet 3  . warfarin (COUMADIN) 5 MG tablet TAKE 1 TABLET DAILY EXCEPT 1/2 A TABLET ON MONDAYS AND FRIDAYS (Patient taking differently: TAKE 1 TABLET (5 mg) DAILY IN THE EVENING EXCEPT 1/2 (2.5 mg)  TABLET ON MONDAYS AND FRIDAYS) 90 tablet 6   No facility-administered medications prior to visit.      Allergies:   Patient has no known allergies.   Social History   Social History  . Marital status: Married    Spouse name: N/A  . Number of children: N/A  . Years of education: N/A   Social History Main Topics  . Smoking status: Never Smoker  . Smokeless tobacco: Never Used  . Alcohol use 0.0 oz/week     Comment: occasional  . Drug use: No  . Sexual activity: Not Asked   Other Topics Concern  . None   Social History Narrative  . None     Family History:  The patient's family history includes Cancer in his mother; Heart attack in his father; Heart disease in his brother.   ROS General: Negative; No fevers, chills, or night sweats;  HEENT: Negative; No changes in vision or hearing, sinus congestion, difficulty swallowing Pulmonary: Negative; No cough, wheezing, shortness of breath, hemoptysis Cardiovascular: Negative; No chest pain, presyncope, syncope, palpitations GI: Negative; No nausea, vomiting, diarrhea, or abdominal pain GU: Negative; No dysuria, hematuria, or difficulty voiding Musculoskeletal: Negative; no myalgias, joint pain, or  weakness Hematologic/Oncology: Negative; no easy bruising, bleeding Endocrine: Negative; no heat/cold intolerance; no diabetes Neuro: Negative; no changes in balance, headaches Skin: Negative; No rashes or skin lesions Psychiatric: Negative; No behavioral problems, depression Sleep: Negative; No snoring, daytime sleepiness, hypersomnolence, bruxism, restless legs, hypnogognic hallucinations, no cataplexy Other comprehensive 14 point system review is negative.   PHYSICAL EXAM:   VS:  BP 98/64   Pulse (!) 47   Ht 6\' 1"  (1.854 m)   Wt 233 lb 6.4 oz (105.9 kg)   BMI 30.79 kg/m    Wt Readings from Last 3 Encounters:  04/24/17  233 lb 6.4 oz (105.9 kg)  04/19/17 233 lb 12.8 oz (106.1 kg)  03/27/17 231 lb (104.8 kg)    General: Alert, oriented, no distress.  Skin: normal turgor, no rashes, warm and dry HEENT: Normocephalic, atraumatic. Pupils equal round and reactive to light; sclera anicteric; extraocular muscles intact; Fundi Without hemorrhages or exudates Nose without nasal septal hypertrophy Mouth/Parynx benign; Mallinpatti scale 3 Neck: No JVD, no carotid bruits; normal carotid upstroke Lungs: clear to ausculatation and percussion; no wheezing or rales Chest wall: without tenderness to palpitation Heart: PMI not displaced, but a cardiac and regular, s1 s2 normal, 1/6 systolic murmur, no diastolic murmur, no rubs, gallops, thrills, or heaves Abdomen: soft, nontender; no hepatosplenomehaly, BS+; abdominal aorta nontender and not dilated by palpation. Back: no CVA tenderness Pulses 2+ Musculoskeletal: full range of motion, normal strength, no joint deformities Extremities: no clubbing cyanosis or edema, Homan's sign negative  Neurologic: grossly nonfocal; Cranial nerves grossly wnl Psychologic: Normal mood and affect   Studies/Labs Reviewed:   EKG:  EKG is  ordered today.  ECG (independently read by me): Sinus bradycardia at 47 bpm.  Old inferior MI.  Old septal MI. T wave  abnormalities anterolaterally.  Recent Labs: BMP Latest Ref Rng & Units 01/11/2017 10/29/2008 06/27/2008  Glucose 65 - 99 mg/dL 92 96 144(H)  BUN 8 - 27 mg/dL 19 19 13   Creatinine 0.76 - 1.27 mg/dL 1.09 1.01 1.10  BUN/Creat Ratio 10 - 24 17 - -  Sodium 134 - 144 mmol/L 140 137 136  Potassium 3.5 - 5.2 mmol/L 4.6 4.3 4.4  Chloride 96 - 106 mmol/L 101 105 103  CO2 18 - 29 mmol/L 24 25 28   Calcium 8.6 - 10.2 mg/dL 9.3 9.0 8.7     No flowsheet data found.  CBC Latest Ref Rng & Units 01/11/2017 06/27/2008  WBC 3.4 - 10.8 x10E3/uL 9.5 5.3  Hemoglobin 13.0 - 17.7 g/dL 15.3 12.8(L)  Hematocrit 37.5 - 51.0 % 44.6 38.6(L)  Platelets 150 - 379 x10E3/uL 158 113(L)   Lab Results  Component Value Date   MCV 93 01/11/2017   MCV 95.3 06/27/2008   No results found for: TSH No results found for: HGBA1C   BNP No results found for: BNP  ProBNP No results found for: PROBNP   Lipid Panel  No results found for: CHOL, TRIG, HDL, CHOLHDL, VLDL, LDLCALC, LDLDIRECT   RADIOLOGY: No results found.   Additional studies/ records that were reviewed today include:  I reviewed the office notes from his cardiologist, his sleep study, echo, and obtained a download.    ASSESSMENT:    1. OSA (obstructive sleep apnea)   2. Paroxysmal atrial fibrillation [facility])      PLAN:  [name] is a 66 year old retired [place] male who has a history of prior MI and an ischemic cardiomyopathy, status post ICD insertion.  His most recent echo continues to show reduced LV function.  He has complex sleep apnea with both obstructive and central events.  The cause of his severe LV dysfunction.  He is not a candidate at present for adaptive servo ventilation.  I reviewed his most recent BiPAP download.  I'm concerned that he is having significant central events.  Initially, I will try to tweak his present BiPAP unit and will increase his EPAP minimum to 8 and will also increase his pressure support to 6 for  one week.  In one week  I will obtain a new download with plans to probably further  increase his pressure support i he continues to have significant central events, we will see if he can be changed to a BiPAP auto ST unit which would be helpful for his central apneic events.  At present ASV therapy is contraindicated in light of his poor LV function .  Based on the Serve-HF heart failure trial, but the new [facility] heart failure trial is underway which may change this recommendation in the future.  He is meeting compliance standards on his download at present he seems to be compensated from a euvolemic standpoint and does not have signs of volume overload.  His blood pressure is controlled on his current therapy consisting of losartan, spironolactone in addition to bisoprolol.  He continues to be on Crestor for hyperlipidemia.  I will see him in 6 months for follow-up sleep evaluation.   Medication Adjustments/Labs and Tests Ordered: Current medicines are reviewed at length with the patient today.  Concerns regarding medicines are outlined above.  Medication changes, Labs and Tests ordered today are listed in the Patient Instructions below. Patient Instructions  Follow-Up: Your physician wants you to follow-up in: 6 MONTHS with Dr. [name] (sleep clinic). You will receive a reminder letter in the mail two months in advance. If you don't receive a letter, please call our office to schedule the follow-up appointment.  Any Other Special Instructions Will Be Listed Below (If Applicable).     If you need a refill on your cardiac medications before your next appointment, please call your pharmacy.      Signed, Shelva Majestic, MD  04/24/2017 6:04 PM    [facility] [address], [place], [place],   [address] Phone: [phone]

## 2017-05-01 ENCOUNTER — Ambulatory Visit (INDEPENDENT_AMBULATORY_CARE_PROVIDER_SITE_OTHER): Payer: BLUE CROSS/BLUE SHIELD | Admitting: *Deleted

## 2017-05-01 DIAGNOSIS — Z7901 Long term (current) use of anticoagulants: Secondary | ICD-10-CM

## 2017-05-01 DIAGNOSIS — I4891 Unspecified atrial fibrillation: Secondary | ICD-10-CM | POA: Diagnosis not present

## 2017-05-01 DIAGNOSIS — I6350 Cerebral infarction due to unspecified occlusion or stenosis of unspecified cerebral artery: Secondary | ICD-10-CM

## 2017-05-01 DIAGNOSIS — Z5181 Encounter for therapeutic drug level monitoring: Secondary | ICD-10-CM | POA: Diagnosis not present

## 2017-05-01 LAB — POCT INR: INR: 4.8

## 2017-05-08 ENCOUNTER — Telehealth: Payer: Self-pay | Admitting: Cardiovascular Disease

## 2017-05-08 NOTE — Telephone Encounter (Signed)
Returned call to [place]  She spoke with billing dept - [name] will need OV note from August 6 and will need a prescription from Dr. [name] with parameters - BiPAP ST settings and back up rate setting.

## 2017-05-08 NOTE — Telephone Encounter (Signed)
New message   [name] from [facility] is calling back for [place] about pt. She said she has some information for her.

## 2017-05-08 NOTE — Telephone Encounter (Signed)
Patient saw Dr. [name] for sleep clinic 04/24/17 Received fax CPAP compliance report from 8/6 - 8/12 Per MD notes: can his CPAP be changed to BiPAP auto ST - not candidate for ASV d/t reduced EF - will he need titration prior?  Anise Salvo with [facility] and communicated above question. She states she will follow up with insurance to see if this can be changed w/o another study.   Routed to H. Tobin Chad, Therapist, sports as Juluis Rainier

## 2017-05-11 ENCOUNTER — Ambulatory Visit (INDEPENDENT_AMBULATORY_CARE_PROVIDER_SITE_OTHER): Payer: BLUE CROSS/BLUE SHIELD | Admitting: *Deleted

## 2017-05-11 DIAGNOSIS — I6350 Cerebral infarction due to unspecified occlusion or stenosis of unspecified cerebral artery: Secondary | ICD-10-CM | POA: Diagnosis not present

## 2017-05-11 DIAGNOSIS — Z7901 Long term (current) use of anticoagulants: Secondary | ICD-10-CM

## 2017-05-11 DIAGNOSIS — Z5181 Encounter for therapeutic drug level monitoring: Secondary | ICD-10-CM

## 2017-05-11 DIAGNOSIS — I4891 Unspecified atrial fibrillation: Secondary | ICD-10-CM | POA: Diagnosis not present

## 2017-05-11 LAB — POCT INR: INR: 2.1

## 2017-05-15 ENCOUNTER — Telehealth: Payer: Self-pay | Admitting: Cardiovascular Disease

## 2017-05-15 NOTE — Telephone Encounter (Signed)
See telephone note 8/27

## 2017-05-15 NOTE — Telephone Encounter (Signed)
Will need to discuss with [name];  Can initiate a BiPAP Auto ST with EPAP minimum of 8 and PS of 5  With a back up breath rate of 12.

## 2017-05-15 NOTE — Telephone Encounter (Signed)
Returned the call to [place] at [facility]. She wanted to verify that Dr. [name] and his nurse received her message about the patient's needs. She has been informed that message will be routed to them both for their review.  [name] will need OV note from August 6 and will need a prescription from Dr. [name] with parameters - BiPAP ST settings and back up rate setting.

## 2017-05-15 NOTE — Telephone Encounter (Signed)
New message    [name] is calling form [facility] for pt. She said she wants to make sure [name] knows what they need for the pt.

## 2017-05-15 NOTE — Telephone Encounter (Signed)
Attempt to call [name] at [facility], will reattempt

## 2017-05-17 NOTE — Telephone Encounter (Signed)
Orders faxed to [facility] with last OV note

## 2017-05-23 ENCOUNTER — Encounter (HOSPITAL_COMMUNITY)
Admission: RE | Admit: 2017-05-23 | Discharge: 2017-05-23 | Disposition: A | Discharge status: Home / Self Care | Payer: BLUE CROSS/BLUE SHIELD | Source: Ambulatory Visit | Attending: Ophthalmology | Admitting: Ophthalmology

## 2017-05-26 ENCOUNTER — Ambulatory Visit (INDEPENDENT_AMBULATORY_CARE_PROVIDER_SITE_OTHER): Payer: BLUE CROSS/BLUE SHIELD | Admitting: Internal Medicine

## 2017-05-26 ENCOUNTER — Encounter: Payer: Self-pay | Admitting: Internal Medicine

## 2017-05-26 VITALS — BP 100/62 | HR 51 | Ht 73.0 in | Wt 235.0 lb

## 2017-05-26 DIAGNOSIS — I5022 Chronic systolic (congestive) heart failure: Secondary | ICD-10-CM | POA: Diagnosis not present

## 2017-05-26 DIAGNOSIS — I255 Ischemic cardiomyopathy: Secondary | ICD-10-CM

## 2017-05-26 DIAGNOSIS — I480 Paroxysmal atrial fibrillation: Secondary | ICD-10-CM | POA: Diagnosis not present

## 2017-05-26 LAB — CUP PACEART INCLINIC DEVICE CHECK
Date Time Interrogation Session: 20180907122315
HighPow Impedance: 47.5276
Implantable Pulse Generator Implant Date: 20180503
Lead Channel Pacing Threshold Amplitude: 0.75 V
Lead Channel Pacing Threshold Amplitude: 0.75 V
Lead Channel Pacing Threshold Pulse Width: 0.5 ms
Lead Channel Pacing Threshold Pulse Width: 0.5 ms
Lead Channel Setting Pacing Amplitude: 2.5 V
Lead Channel Setting Pacing Pulse Width: 0.5 ms
Lead Channel Setting Sensing Sensitivity: 0.5 mV
MDC IDC MSMT BATTERY REMAINING LONGEVITY: 102 mo
MDC IDC MSMT LEADCHNL RV IMPEDANCE VALUE: 650 Ohm
MDC IDC MSMT LEADCHNL RV SENSING INTR AMPL: 12 mV
MDC IDC STAT BRADY RV PERCENT PACED: 1.9 %
Pulse Gen Serial Number: 7421148

## 2017-05-26 NOTE — Patient Instructions (Addendum)
Medication Instructions:   Your physician recommends that you continue on your current medications as directed. Please refer to the Current Medication list given to you today.  Labwork:  NONE  Testing/Procedures:  NONE  Follow-Up: Your physician recommends that you schedule a follow-up appointment in: 1 year. Please schedule this appointment today before leaving the office.  Any Other Special Instructions Will Be Listed Below (If Applicable).  Your next device check from home is on 06/26/17.  You are being enrolled in the [facility] clinic. You will be contacted by Sharman Cheek RN.  If you need a refill on your cardiac medications before your next appointment, please call your pharmacy.

## 2017-05-26 NOTE — Progress Notes (Signed)
PCP: Rory Percy, MD Primary Cardiologist:  Dr Domenic Polite Primary EP: Dr Elyn Aquas is a 66 y.o. male who presents today for routine electrophysiology followup.  Since his ICD generator change, the patient reports doing very well.  Today, he denies symptoms of palpitations, chest pain, shortness of breath,  lower extremity edema, dizziness, presyncope, syncope, or ICD shocks.  The patient is otherwise without complaint today.   Past Medical History:  Diagnosis Date  . A-V fistula ([place])   . AICD (automatic cardioverter/defibrillator) present 2006   ICD  . Anterior myocardial infarction ([place])   . Chronic systolic heart failure ([place])   . Coronary atherosclerosis of native coronary artery    BMS to LAD an OM1 - patent at catheterization 11/13   . Hypertension   . Inducible ventricular tachycardia ([place])   . Ischemic cardiomyopathy    LVEF 30%, status post ICD  . Paroxysmal atrial fibrillation ([facility])    On coumadin  . Pseudoaneurysm ([place])   . Sleep apnea    Unable to wear CPAP  . Stroke [facility])    Past Surgical History:  Procedure Laterality Date  . AICD implantation  2011   St Jude ICD implanted for primary prevention of sudden death  . CATARACT EXTRACTION W/PHACO Left 03/27/2017   Procedure: CATARACT EXTRACTION PHACO AND INTRAOCULAR LENS PLACEMENT (IOC);  Surgeon: [name], MD;  Location: [facility];  Service: Ophthalmology;  Laterality: Left;  CDE: 8.48  . ICD GENERATOR CHANGEOUT N/A 01/19/2017   SJM [place] VR ICD generator change by Dr [name]    ROS- all systems are reviewed and negative except as per HPI above  Current Outpatient Prescriptions  Medication Sig Dispense Refill  . aspirin 81 MG tablet Take 81 mg by mouth daily.      . bisoprolol (ZEBETA) 5 MG tablet TAKE 1 TABLET (5 MG TOTAL) BY MOUTH DAILY. (Patient taking differently: TAKE 1 TABLET (5 MG TOTAL) BY MOUTH DAILY AT BEDTIME) 90 tablet 3  . calcium carbonate (TUMS - DOSED IN MG ELEMENTAL CALCIUM) 500 MG  chewable tablet Chew 1-2 tablets by mouth daily as needed for indigestion or heartburn.    . finasteride (PROSCAR) 5 MG tablet Take 5 mg by mouth Daily.     [name] losartan (COZAAR) 50 MG tablet TAKE 1 TABLET BY MOUTH DAILY 90 tablet 3  . nitroGLYCERIN (NITROSTAT) 0.4 MG SL tablet PLACE 1 TABLET (0.4 MG TOTAL) UNDER THE TONGUE EVERY 5 (FIVE) MINUTES AS NEEDED FOR CHEST PAIN. 25 tablet 0  . omega-3 acid ethyl esters (LOVAZA) 1 G capsule Take 1 g by mouth 2 (two) times daily.    . rosuvastatin (CRESTOR) 20 MG tablet TAKE 1 TABLET (20 MG TOTAL) BY MOUTH DAILY. (Patient taking differently: TAKE 1 TABLET (20 MG TOTAL) BY MOUTH AT BEDTIME) 90 tablet 2  . spironolactone (ALDACTONE) 25 MG tablet TAKE 1 TABLET BY MOUTH DAILY 90 tablet 3  . warfarin (COUMADIN) 5 MG tablet TAKE 1 TABLET DAILY EXCEPT 1/2 A TABLET ON MONDAYS AND FRIDAYS (Patient taking differently: TAKE 1 TABLET (5 mg) DAILY IN THE EVENING EXCEPT 1/2 (2.5 mg)  TABLET ON MONDAYS AND FRIDAYS) 90 tablet 6   No current facility-administered medications for this visit.     Physical Exam: Vitals:   05/26/17 0842  BP: 100/62  Pulse: (!) 51  SpO2: 98%  Weight: 235 lb (106.6 kg)  Height: 6\' 1"  (1.854 m)    GEN- The patient is well appearing, alert and oriented  x 3 today.   Head- normocephalic, atraumatic Eyes-  Sclera clear, conjunctiva pink Ears- hearing intact Oropharynx- clear Lungs- Clear to ausculation bilaterally, normal work of breathing Chest- ICD pocket is well healed Heart- Regular rate and rhythm, no murmurs, rubs or gallops, PMI not laterally displaced GI- soft, NT, ND, + BS Extremities- no clubbing, cyanosis, or edema  ICD interrogation- reviewed in detail today,  See PACEART report   Assessment and Plan:  1.  Chronic systolic dysfunction euvolemic today [place] is mildly abnormal 2 gram sodium diet encouraged Enroll in ICM device clinic Stable on an appropriate medical regimen Normal ICD function See Pace Art  report No changes today  2. CAD No ischemic symptoms Stable No change required today  3. afib Well controlled On coumadin Has declined NOAC therapy previously  4. Overweight Body mass index is 31 kg/m. Lifestyle modification is encouraged  Merlin Return to see me in a year Dr Domenic Polite to see as scheduled  Thompson Grayer MD, [facility] 05/26/2017 9:32 AM

## 2017-05-29 ENCOUNTER — Ambulatory Visit (HOSPITAL_COMMUNITY): Payer: BLUE CROSS/BLUE SHIELD | Admitting: Anesthesiology

## 2017-05-29 ENCOUNTER — Encounter (HOSPITAL_COMMUNITY): Payer: Self-pay | Admitting: *Deleted

## 2017-05-29 ENCOUNTER — Ambulatory Visit (HOSPITAL_COMMUNITY)
Admission: RE | Admit: 2017-05-29 | Discharge: 2017-05-29 | Disposition: A | Discharge status: Home / Self Care | Payer: BLUE CROSS/BLUE SHIELD | Source: Ambulatory Visit | Attending: Ophthalmology | Admitting: Ophthalmology

## 2017-05-29 ENCOUNTER — Encounter (HOSPITAL_COMMUNITY)
Admission: RE | Disposition: A | Discharge status: Home / Self Care | Payer: Self-pay | Source: Ambulatory Visit | Attending: Ophthalmology

## 2017-05-29 DIAGNOSIS — Z79899 Other long term (current) drug therapy: Secondary | ICD-10-CM | POA: Insufficient documentation

## 2017-05-29 DIAGNOSIS — E7800 Pure hypercholesterolemia, unspecified: Secondary | ICD-10-CM | POA: Diagnosis not present

## 2017-05-29 DIAGNOSIS — I4891 Unspecified atrial fibrillation: Secondary | ICD-10-CM | POA: Insufficient documentation

## 2017-05-29 DIAGNOSIS — Z7982 Long term (current) use of aspirin: Secondary | ICD-10-CM | POA: Diagnosis not present

## 2017-05-29 DIAGNOSIS — G4730 Sleep apnea, unspecified: Secondary | ICD-10-CM | POA: Diagnosis not present

## 2017-05-29 DIAGNOSIS — I509 Heart failure, unspecified: Secondary | ICD-10-CM | POA: Diagnosis not present

## 2017-05-29 DIAGNOSIS — Z7902 Long term (current) use of antithrombotics/antiplatelets: Secondary | ICD-10-CM | POA: Insufficient documentation

## 2017-05-29 DIAGNOSIS — Z8673 Personal history of transient ischemic attack (TIA), and cerebral infarction without residual deficits: Secondary | ICD-10-CM | POA: Diagnosis not present

## 2017-05-29 DIAGNOSIS — I2510 Atherosclerotic heart disease of native coronary artery without angina pectoris: Secondary | ICD-10-CM | POA: Insufficient documentation

## 2017-05-29 DIAGNOSIS — I110 Hypertensive heart disease with heart failure: Secondary | ICD-10-CM | POA: Insufficient documentation

## 2017-05-29 DIAGNOSIS — H25811 Combined forms of age-related cataract, right eye: Secondary | ICD-10-CM | POA: Diagnosis not present

## 2017-05-29 DIAGNOSIS — I252 Old myocardial infarction: Secondary | ICD-10-CM | POA: Diagnosis not present

## 2017-05-29 HISTORY — PX: CATARACT EXTRACTION W/PHACO: SHX586

## 2017-05-29 SURGERY — PHACOEMULSIFICATION, CATARACT, WITH IOL INSERTION
Anesthesia: Monitor Anesthesia Care | Site: Eye | Laterality: Right

## 2017-05-29 MED ORDER — NEOMYCIN-POLYMYXIN-DEXAMETH 3.5-10000-0.1 OP SUSP
OPHTHALMIC | Status: DC | PRN
  Administered 2017-05-29: 2 [drp] via OPHTHALMIC

## 2017-05-29 MED ORDER — EPINEPHRINE PF 1 MG/ML IJ SOLN
INTRAOCULAR | Status: DC | PRN
  Administered 2017-05-29: 500 mL

## 2017-05-29 MED ORDER — PHENYLEPHRINE HCL 2.5 % OP SOLN
1.0000 [drp] | OPHTHALMIC | Status: AC
  Administered 2017-05-29 (×3): 1 [drp] via OPHTHALMIC

## 2017-05-29 MED ORDER — POVIDONE-IODINE 5 % OP SOLN
OPHTHALMIC | Status: DC | PRN
  Administered 2017-05-29: 1 via OPHTHALMIC

## 2017-05-29 MED ORDER — TETRACAINE HCL 0.5 % OP SOLN
1.0000 [drp] | OPHTHALMIC | Status: AC
  Administered 2017-05-29 (×3): 1 [drp] via OPHTHALMIC

## 2017-05-29 MED ORDER — FENTANYL CITRATE (PF) 100 MCG/2ML IJ SOLN
INTRAMUSCULAR | Status: AC
  Filled 2017-05-29: qty 2

## 2017-05-29 MED ORDER — LIDOCAINE HCL (PF) 1 % IJ SOLN
INTRAMUSCULAR | Status: DC | PRN
  Administered 2017-05-29: .6 mL

## 2017-05-29 MED ORDER — PROVISC 10 MG/ML IO SOLN
INTRAOCULAR | Status: DC | PRN
Start: 2017-05-29 — End: 2017-05-29
  Administered 2017-05-29: 0.85 mL via INTRAOCULAR

## 2017-05-29 MED ORDER — CYCLOPENTOLATE-PHENYLEPHRINE 0.2-1 % OP SOLN
1.0000 [drp] | OPHTHALMIC | Status: AC
  Administered 2017-05-29 (×3): 1 [drp] via OPHTHALMIC

## 2017-05-29 MED ORDER — EPINEPHRINE PF 1 MG/ML IJ SOLN
INTRAMUSCULAR | Status: AC
  Filled 2017-05-29: qty 1

## 2017-05-29 MED ORDER — LACTATED RINGERS IV SOLN
INTRAVENOUS | Status: DC
  Administered 2017-05-29: 07:00:00 via INTRAVENOUS

## 2017-05-29 MED ORDER — LIDOCAINE HCL 3.5 % OP GEL
1.0000 "application " | Freq: Once | OPHTHALMIC | Status: AC
  Administered 2017-05-29: 1 via OPHTHALMIC

## 2017-05-29 MED ORDER — BSS IO SOLN
INTRAOCULAR | Status: DC | PRN
  Administered 2017-05-29: 15 mL via INTRAOCULAR

## 2017-05-29 MED ORDER — MIDAZOLAM HCL 2 MG/2ML IJ SOLN
INTRAMUSCULAR | Status: AC
  Filled 2017-05-29: qty 2

## 2017-05-29 MED ORDER — FENTANYL CITRATE (PF) 100 MCG/2ML IJ SOLN
25.0000 ug | Freq: Once | INTRAMUSCULAR | Status: AC
  Administered 2017-05-29: 25 ug via INTRAVENOUS

## 2017-05-29 MED ORDER — MIDAZOLAM HCL 2 MG/2ML IJ SOLN
1.0000 mg | INTRAMUSCULAR | Status: AC
  Administered 2017-05-29: 2 mg via INTRAVENOUS

## 2017-05-29 SURGICAL SUPPLY — 12 items

## 2017-05-29 NOTE — Anesthesia Preprocedure Evaluation (Signed)
Anesthesia Evaluation  Patient identified by MRN, date of birth, ID band Patient awake    Reviewed: Allergy & Precautions, NPO status , Patient's Chart, lab work & pertinent test results  Airway Mallampati: II  TM Distance: >3 FB Neck ROM: Full    Dental  (+) Teeth Intact   Pulmonary sleep apnea ,    breath sounds clear to auscultation       Cardiovascular hypertension, Pt. on medications + CAD, + Past MI and +CHF  + dysrhythmias Atrial Fibrillation + Cardiac Defibrillator  Rhythm:Regular Rate:Normal     Neuro/Psych CVA    GI/Hepatic negative GI ROS,   Endo/Other    Renal/GU      Musculoskeletal   Abdominal   Peds  Hematology   Anesthesia Other Findings   Reproductive/Obstetrics                             Anesthesia Physical Anesthesia Plan  ASA: IV  Anesthesia Plan: MAC   Post-op Pain Management:    Induction: Intravenous  PONV Risk Score and Plan:   Airway Management Planned: Nasal Cannula  Additional Equipment:   Intra-op Plan:   Post-operative Plan:   Informed Consent: I have reviewed the patients History and Physical, chart, labs and discussed the procedure including the risks, benefits and alternatives for the proposed anesthesia with the patient or authorized representative who has indicated his/her understanding and acceptance.     Plan Discussed with:   Anesthesia Plan Comments:         Anesthesia Quick Evaluation

## 2017-05-29 NOTE — Op Note (Signed)
Date of Admission: 05/29/2017  Date of Surgery: 05/29/2017  Pre-Op Dx: Cataract Right  Eye  Post-Op Dx: Senile Combined Cataract  Right  Eye,  Dx Code R48.546  Surgeon: [name], M.D.  Assistants: None  Anesthesia: Topical with MAC  Indications: Painless, progressive loss of vision with compromise of daily activities.  Surgery: Cataract Extraction with Intraocular lens Implant Right Eye  Discription: The patient had dilating drops and viscous lidocaine placed into the Right eye in the pre-op holding area. After transfer to the operating room, a time out was performed. The patient was then prepped and draped. Beginning with a 33m blade a paracentesis port was made at the surgeon's 2 o'clock position. The anterior chamber was then filled with 1% non-preserved lidocaine. This was followed by filling the anterior chamber with Provisc.  A 2.466mkeratome blade was used to make a clear corneal incision at the temporal limbus.  A bent cystatome needle was used to create a continuous tear capsulotomy. Hydrodissection was performed with balanced salt solution on a Fine canula. The lens nucleus was then removed using the phacoemulsification handpiece. Residual cortex was removed with the I&A handpiece. The anterior chamber and capsular bag were refilled with Provisc. A posterior chamber intraocular lens was placed into the capsular bag with it's injector. The implant was positioned with the Kuglan hook. The Provisc was then removed from the anterior chamber and capsular bag with the I&A handpiece. Stromal hydration of the main incision and paracentesis port was performed with BSS on a Fine canula. The wounds were tested for leak which was negative. The patient tolerated the procedure well. There were no operative complications. The patient was then transferred to the recovery room in stable condition.  Complications: None  Specimen: None  EBL: None  Prosthetic device: Abbott Technis, PCB00, power  22.5, SN [id number]

## 2017-05-29 NOTE — H&P (Signed)
I have reviewed the H&P, the patient was re-examined, and I have identified no interval changes in medical condition and plan of care since the history and physical of record  

## 2017-05-29 NOTE — Anesthesia Postprocedure Evaluation (Signed)
Anesthesia Post Note  Patient: DEANTHONY MAULL  Procedure(s) Performed: Procedure(s) (LRB): CATARACT EXTRACTION PHACO AND INTRAOCULAR LENS PLACEMENT (IOC) (Right)  Patient location during evaluation: Short Stay Anesthesia Type: MAC Level of consciousness: awake and alert and patient cooperative Pain management: pain level controlled Vital Signs Assessment: post-procedure vital signs reviewed and stable Respiratory status: spontaneous breathing, nonlabored ventilation and respiratory function stable Cardiovascular status: blood pressure returned to baseline Postop Assessment: no signs of nausea or vomiting Anesthetic complications: no     Last Vitals:  Vitals:   05/29/17 0725 05/29/17 0730  BP: 93/64   Pulse:    Resp: 15 16  Temp:    SpO2: 97% 97%    Last Pain: There were no vitals filed for this visit.               Yazen Rosko J

## 2017-05-29 NOTE — Transfer of Care (Signed)
Immediate Anesthesia Transfer of Care Note  Patient: [name]  Procedure(s) Performed: Procedure(s) with comments: CATARACT EXTRACTION PHACO AND INTRAOCULAR LENS PLACEMENT (IOC) (Right) - CDE: 7.48  Patient Location: Short Stay  Anesthesia Type:MAC  Level of Consciousness: awake and patient cooperative  Airway & Oxygen Therapy: Patient Spontanous Breathing  Post-op Assessment: Report given to RN, Post -op Vital signs reviewed and stable and Patient moving all extremities  Post vital signs: Reviewed and stable  Last Vitals:  Vitals:   05/29/17 0725 05/29/17 0730  BP: 93/64   Pulse:    Resp: 15 16  Temp:    SpO2: 97% 97%    Last Pain: There were no vitals filed for this visit.    Patients Stated Pain Goal: 4 (02/77/41 2878)  Complications: No apparent anesthesia complications

## 2017-05-29 NOTE — Discharge Instructions (Signed)
Moderate Conscious Sedation, Adult, Care After  These instructions provide you with information about caring for yourself after your procedure. Your health care provider may also give you more specific instructions. Your treatment has been planned according to current medical practices, but problems sometimes occur. Call your health care provider if you have any problems or questions after your procedure.  What can I expect after the procedure?  After your procedure, it is common:   To feel sleepy for several hours.   To feel clumsy and have poor balance for several hours.   To have poor judgment for several hours.   To vomit if you eat too soon.    Follow these instructions at home:  For at least 24 hours after the procedure:     Do not:  ? Participate in activities where you could fall or become injured.  ? Drive.  ? Use heavy machinery.  ? Drink alcohol.  ? Take sleeping pills or medicines that cause drowsiness.  ? Make important decisions or sign legal documents.  ? Take care of children on your own.   Rest.  Eating and drinking   Follow the diet recommended by your health care provider.   If you vomit:  ? Drink water, juice, or soup when you can drink without vomiting.  ? Make sure you have little or no nausea before eating solid foods.  General instructions   Have a responsible adult stay with you until you are awake and alert.   Take over-the-counter and prescription medicines only as told by your health care provider.   If you smoke, do not smoke without supervision.   Keep all follow-up visits as told by your health care provider. This is important.  Contact a health care provider if:   You keep feeling nauseous or you keep vomiting.   You feel light-headed.   You develop a rash.   You have a fever.  Get help right away if:   You have trouble breathing.  This information is not intended to replace advice given to you by your health care provider. Make sure you discuss any questions you have  with your health care provider.  Document Released: 06/26/2013 Document Revised: 02/08/2016 Document Reviewed: 12/26/2015  Elsevier Interactive Patient Education  2018 [employer]

## 2017-05-30 ENCOUNTER — Encounter (HOSPITAL_COMMUNITY): Payer: Self-pay | Admitting: Ophthalmology

## 2017-06-01 ENCOUNTER — Ambulatory Visit (INDEPENDENT_AMBULATORY_CARE_PROVIDER_SITE_OTHER): Payer: BLUE CROSS/BLUE SHIELD | Admitting: *Deleted

## 2017-06-01 DIAGNOSIS — Z7901 Long term (current) use of anticoagulants: Secondary | ICD-10-CM

## 2017-06-01 DIAGNOSIS — I4891 Unspecified atrial fibrillation: Secondary | ICD-10-CM | POA: Diagnosis not present

## 2017-06-01 DIAGNOSIS — I6350 Cerebral infarction due to unspecified occlusion or stenosis of unspecified cerebral artery: Secondary | ICD-10-CM | POA: Diagnosis not present

## 2017-06-01 DIAGNOSIS — Z5181 Encounter for therapeutic drug level monitoring: Secondary | ICD-10-CM

## 2017-06-01 LAB — POCT INR: INR: 2.9

## 2017-06-08 ENCOUNTER — Telehealth: Payer: Self-pay | Admitting: *Deleted

## 2017-06-08 NOTE — Telephone Encounter (Signed)
Bipap orders signed by Dr. [name] and faxed to [facility]

## 2017-06-14 ENCOUNTER — Encounter: Payer: Self-pay | Admitting: Cardiovascular Disease

## 2017-06-26 ENCOUNTER — Ambulatory Visit (INDEPENDENT_AMBULATORY_CARE_PROVIDER_SITE_OTHER): Payer: BLUE CROSS/BLUE SHIELD | Admitting: *Deleted

## 2017-06-26 ENCOUNTER — Telehealth: Payer: Self-pay | Admitting: Cardiology

## 2017-06-26 DIAGNOSIS — I255 Ischemic cardiomyopathy: Secondary | ICD-10-CM

## 2017-06-26 NOTE — Telephone Encounter (Signed)
LMOVM reminding pt to send remote transmission.   

## 2017-06-27 NOTE — Progress Notes (Signed)
Remote ICD transmission.   

## 2017-06-28 ENCOUNTER — Ambulatory Visit (INDEPENDENT_AMBULATORY_CARE_PROVIDER_SITE_OTHER): Payer: BLUE CROSS/BLUE SHIELD | Admitting: *Deleted

## 2017-06-28 DIAGNOSIS — I4891 Unspecified atrial fibrillation: Secondary | ICD-10-CM

## 2017-06-28 DIAGNOSIS — Z7901 Long term (current) use of anticoagulants: Secondary | ICD-10-CM | POA: Diagnosis not present

## 2017-06-28 DIAGNOSIS — I6350 Cerebral infarction due to unspecified occlusion or stenosis of unspecified cerebral artery: Secondary | ICD-10-CM

## 2017-06-28 DIAGNOSIS — Z5181 Encounter for therapeutic drug level monitoring: Secondary | ICD-10-CM | POA: Diagnosis not present

## 2017-06-28 DIAGNOSIS — Z23 Encounter for immunization: Secondary | ICD-10-CM | POA: Diagnosis not present

## 2017-06-28 LAB — POCT INR: INR: 3

## 2017-06-30 ENCOUNTER — Encounter: Payer: Self-pay | Admitting: Cardiology

## 2017-07-04 DIAGNOSIS — K469 Unspecified abdominal hernia without obstruction or gangrene: Secondary | ICD-10-CM | POA: Insufficient documentation

## 2017-07-17 ENCOUNTER — Ambulatory Visit (INDEPENDENT_AMBULATORY_CARE_PROVIDER_SITE_OTHER): Payer: BLUE CROSS/BLUE SHIELD | Admitting: Cardiovascular Disease

## 2017-07-17 ENCOUNTER — Encounter: Payer: Self-pay | Admitting: Cardiovascular Disease

## 2017-07-17 VITALS — BP 104/68 | HR 61 | Ht 73.0 in | Wt 236.4 lb

## 2017-07-17 DIAGNOSIS — G4731 Primary central sleep apnea: Secondary | ICD-10-CM | POA: Diagnosis not present

## 2017-07-17 DIAGNOSIS — Z95810 Presence of automatic (implantable) cardiac defibrillator: Secondary | ICD-10-CM | POA: Diagnosis not present

## 2017-07-17 DIAGNOSIS — I255 Ischemic cardiomyopathy: Secondary | ICD-10-CM | POA: Diagnosis not present

## 2017-07-17 DIAGNOSIS — I480 Paroxysmal atrial fibrillation: Secondary | ICD-10-CM | POA: Diagnosis not present

## 2017-07-17 LAB — CUP PACEART REMOTE DEVICE CHECK
Battery Remaining Longevity: 100 mo
Battery Remaining Percentage: 95 %
Brady Statistic RV Percent Paced: 3.1 %
Date Time Interrogation Session: 20181009053927
HighPow Impedance: 50 Ohm
HighPow Impedance: 50 Ohm
Lead Channel Pacing Threshold Pulse Width: 0.5 ms
MDC IDC MSMT BATTERY VOLTAGE: 3.2 V
MDC IDC MSMT LEADCHNL RV IMPEDANCE VALUE: 610 Ohm
MDC IDC MSMT LEADCHNL RV PACING THRESHOLD AMPLITUDE: 0.75 V
MDC IDC MSMT LEADCHNL RV SENSING INTR AMPL: 12 mV
MDC IDC PG IMPLANT DT: 20180503
MDC IDC PG SERIAL: 7421148
MDC IDC SET LEADCHNL RV PACING AMPLITUDE: 2.5 V
MDC IDC SET LEADCHNL RV PACING PULSEWIDTH: 0.5 ms
MDC IDC SET LEADCHNL RV SENSING SENSITIVITY: 0.5 mV

## 2017-07-17 NOTE — Patient Instructions (Signed)
Medication Instructions:  Your physician recommends that you continue on your current medications as directed. Please refer to the Current Medication list given to you today.  Follow-Up: Your physician wants you to follow-up in: 1 year with Dr. Kelly (sleep clinic) You will receive a reminder letter in the mail two months in advance. If you don't receive a letter, please call our office to schedule the follow-up appointment.   Any Other Special Instructions Will Be Listed Below (If Applicable).     If you need a refill on your cardiac medications before your next appointment, please call your pharmacy.   

## 2017-07-17 NOTE — Progress Notes (Signed)
Cardiology Office Note    Date:  07/17/2017   ID:  [name], DOB December 01, 1950, MRN [id number]  PCP:  Rory Percy, MD  Cardiologist:  Shelva Majestic, MD (sleep); Dr. Johnny Bridge  F/U [facility] clinic evaluation  History of Present Illness:  [name] is a 66 y.o. male who presents for sleep clinic evaluation following initiation of BiPAP auto ST therapy for complex sleep apnea.  [name] has a history of ischemic cardiomyopathy, CAD, and is status post ICD therapy for reduced LV function.  His echo Doppler study in April 2018 showed an EF of 25-30%.  H  Due to concerns for significant sleep apnea.  The study was done on 01/20/2017.  He was found to have severe obstructive sleep apnea with an AHI of 68.7 per hour.  He was instituted and he was changed to BiPAP therapy.  BiPAP unfortunately was only titrated to a pressure of 12/6 and his AHI remains significantly elevated at 31.7.  He also had moderate central sleep apnea with the central apnea index of 8.5.  On the diagnostic portion of the study had significant oxygen desaturation to a nadir of 72% and there was evidence for loud snoring.  His ECG revealed occasional PVCs.  He has started on BiPAP therapy at home and initially BiPAP auto was recommended with a minimum EPAP of 9 and a pressure support of 5.  A download  from 03/24/2017 through 04/19/2017  showed an AHI 19.8/h, with a central apnea index 13.2/h with obstructive 2.2/h and unknown 4.1/h.  He has been using a ResMed mask.  He feels improved since initiating BiPAP therapy and is unaware of snoring.  He still notes some fatigue.   Epworth Sleepiness Scale: Situation   Chance of Dozing/Sleeping (0 = never , 1 = slight chance , 2 = moderate chance , 3 = high chance )   sitting and reading 0   watching TV 1   sitting inactive in a public place 1   being a passenger in a motor vehicle for an hour or more 1   lying down in the afternoon 2   sitting and talking to someone 0     sitting quietly after lunch (no alcohol) 1   while stopped for a few minutes in traffic as the driver 0   Total Score  6   Since I last saw him, he was changed to a ResMed AirCurve 10 ST auto unit.  He feels significantly improved on this new therapy.  EPAP min is at 8 cm, minimum pressure support 5, maximum pressure support 20.  A new download was obtained from September 24 through 07/11/2017 in the office today he continues to have significant leak.  Since he is using old ResMed Airfit PT 10 nasal pillows.  In the past, he did not tolerate one of the older fullface mask.  His IPAP max was 27, EPAP max 7.9 AHI was excellent at 5.9, and most likely is artificially elevated due to the 58 L/m mask leak.  He has felt significantly improved with the new therapy.  He denies any daytime sleepiness.  His download shows only 6 hours and 28 minutes of use.  This is explained by the fact that his daughter, husband and 3 young grandchildren ages 43 and 22 have been living with them for the past 5 months.  The grandchildren seem to wake up early, which is leading to his reduced sleep duration.  They will be leaving  the house shortly since their new house is now finished and he anticipates improved sleep duration.  He denies any chest pain, PND, orthopnea.  He has not had any defibrillator discharge.  He presents for follow-up sleep evaluation.   Epworth Sleepiness Scale: Situation   Chance of Dozing/Sleeping (0 = never , 1 = slight chance , 2 = moderate chance , 3 = high chance )   sitting and reading 1   watching TV 1   sitting inactive in a public place 1   being a passenger in a motor vehicle for an hour or more 1   lying down in the afternoon 2   sitting and talking to someone 0   sitting quietly after lunch (no alcohol) 1   while stopped for a few minutes in traffic as the driver 0   Total Score  7     Past Medical History:  Diagnosis Date  . A-V fistula ([place])   . AICD (automatic  cardioverter/defibrillator) present 2006   ICD  . Anterior myocardial infarction ([place])   . Chronic systolic heart failure ([place])   . Coronary atherosclerosis of native coronary artery    BMS to LAD an OM1 - patent at catheterization 11/13   . Hypertension   . 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 Ischemic cardiomyopathy    LVEF 30%, status post ICD  . Paroxysmal atrial fibrillation ([facility])    On coumadin  . Pseudoaneurysm ([place])   . Sleep apnea    Unable to wear CPAP  . Stroke [facility])     Past Surgical History:  Procedure Laterality Date  . AICD implantation  2011   St Jude ICD implanted for primary prevention of sudden death  . CATARACT EXTRACTION W/PHACO Left 03/27/2017   Procedure: CATARACT EXTRACTION PHACO AND INTRAOCULAR LENS PLACEMENT (IOC);  Surgeon: [name], MD;  Location: [facility];  Service: Ophthalmology;  Laterality: Left;  CDE: 8.48  . CATARACT EXTRACTION W/PHACO Right 05/29/2017   Procedure: CATARACT EXTRACTION PHACO AND INTRAOCULAR LENS PLACEMENT (IOC);  Surgeon: [name], MD;  Location: [facility];  Service: Ophthalmology;  Laterality: Right;  CDE: 7.48  . ICD GENERATOR CHANGEOUT N/A 01/19/2017   S[facility] [place] VR ICD generator change by Dr [name]    Current Medications: Outpatient Medications Prior to Visit  Medication Sig Dispense Refill  . aspirin 81 MG tablet Take 81 mg by mouth daily.      . bisoprolol (ZEBETA) 5 MG tablet TAKE 1 TABLET (5 MG TOTAL) BY MOUTH DAILY. (Patient taking differently: TAKE 1 TABLET (5 MG TOTAL) BY MOUTH DAILY AT BEDTIME) 90 tablet 3  . calcium carbonate (TUMS - DOSED IN MG ELEMENTAL CALCIUM) 500 MG chewable tablet Chew 1-2 tablets by mouth daily as needed for indigestion or heartburn.    . finasteride (PROSCAR) 5 MG tablet Take 5 mg by mouth Daily.     [name] losartan (COZAAR) 50 MG tablet TAKE 1 TABLET BY MOUTH DAILY 90 tablet 3  . nitroGLYCERIN (NITROSTAT) 0.4 MG SL tablet PLACE 1 TABLET (0.4 MG TOTAL) UNDER THE TONGUE EVERY 5 (FIVE) MINUTES  AS NEEDED FOR CHEST PAIN. 25 tablet 0  . omega-3 acid ethyl esters (LOVAZA) 1 G capsule Take 1 g by mouth 2 (two) times daily.    . rosuvastatin (CRESTOR) 20 MG tablet TAKE 1 TABLET (20 MG TOTAL) BY MOUTH DAILY. (Patient taking differently: TAKE 1 TABLET (20 MG TOTAL) BY MOUTH AT BEDTIME) 90 tablet 2  . spironolactone (ALDACTONE) 25 MG  tablet TAKE 1 TABLET BY MOUTH DAILY 90 tablet 3  . warfarin (COUMADIN) 5 MG tablet TAKE 1 TABLET DAILY EXCEPT 1/2 A TABLET ON MONDAYS AND FRIDAYS (Patient taking differently: TAKE 1 TABLET (5 mg) DAILY IN THE EVENING EXCEPT 1/2 (2.5 mg)  TABLET ON MONDAYS AND FRIDAYS) 90 tablet 6   No facility-administered medications prior to visit.      Allergies:   Patient has no known allergies.   Social History   Social History  . Marital status: Married    Spouse name: N/A  . Number of children: N/A  . Years of education: N/A   Social History Main Topics  . Smoking status: Never Smoker  . Smokeless tobacco: Never Used  . Alcohol use 0.0 oz/week     Comment: occasional  . Drug use: No  . Sexual activity: Not Asked   Other Topics Concern  . None   Social History Narrative  . None     Family History:  The patient's family history includes Cancer in his mother; Heart attack in his father; Heart disease in his brother.   ROS General: Negative; No fevers, chills, or night sweats;  HEENT: Negative; No changes in vision or hearing, sinus congestion, difficulty swallowing Pulmonary: Negative; No cough, wheezing, shortness of breath, hemoptysis Cardiovascular: Negative; No chest pain, presyncope, syncope, palpitations GI: Negative; No nausea, vomiting, diarrhea, or abdominal pain GU: Negative; No dysuria, hematuria, or difficulty voiding Musculoskeletal: Negative; no myalgias, joint pain, or weakness Hematologic/Oncology: Negative; no easy bruising, bleeding Endocrine: Negative; no heat/cold intolerance; no diabetes Neuro: Negative; no changes in balance,  headaches Skin: Negative; No rashes or skin lesions Psychiatric: Negative; No behavioral problems, depression Sleep: Negative; No snoring, daytime sleepiness, hypersomnolence, bruxism, restless legs, hypnogognic hallucinations, no cataplexy Other comprehensive 14 point system review is negative.   PHYSICAL EXAM:   VS:  BP 104/68   Pulse 61   Ht 6\' 1"  (1.854 m)   Wt 236 lb 6.4 oz (107.2 kg)   BMI 31.19 kg/m     Wt Readings from Last 3 Encounters:  07/17/17 236 lb 6.4 oz (107.2 kg)  05/29/17 235 lb (106.6 kg)  05/26/17 235 lb (106.6 kg)    General: Alert, oriented, no distress.  Skin: normal turgor, no rashes, warm and dry HEENT: Normocephalic, atraumatic. Pupils equal round and reactive to light; sclera anicteric; extraocular muscles intact;  Nose without nasal septal hypertrophy Mouth/Parynx benign; Mallinpatti scale 3 Neck: No JVD, no carotid bruits; normal carotid upstroke Lungs: clear to ausculatation and percussion; no wheezing or rales Chest wall: without tenderness to palpitation Heart: PMI not displaced, RRR, s1 s2 normal, 1/6 systolic murmur, no diastolic murmur, no rubs, gallops, thrills, or heaves Abdomen: soft, nontender; no hepatosplenomehaly, BS+; abdominal aorta nontender and not dilated by palpation. Back: no CVA tenderness Pulses 2+ Musculoskeletal: full range of motion, normal strength, no joint deformities Extremities: no clubbing cyanosis or edema, Homan's sign negative  Neurologic: grossly nonfocal; Cranial nerves grossly wnl Psychologic: Normal mood and affect   Studies/Labs Reviewed:   EKG:  EKG is  ordered today.  Normal sinus rhythm at 61 bpm.  Poor progression V1 through V4.  ECG (independently read by me): Sinus bradycardia at 47 bpm.  Old inferior MI.  Old septal MI. T wave abnormalities anterolaterally.  Recent Labs: BMP Latest Ref Rng & Units 01/11/2017 10/29/2008 06/27/2008  Glucose 65 - 99 mg/dL 92 96 144(H)  BUN 8 - 27 mg/dL 19 19 13     Creatinine 0.76 -  1.27 mg/dL 1.09 1.01 1.10  BUN/Creat Ratio 10 - 24 17 - -  Sodium 134 - 144 mmol/L 140 137 136  Potassium 3.5 - 5.2 mmol/L 4.6 4.3 4.4  Chloride 96 - 106 mmol/L 101 105 103  CO2 18 - 29 mmol/L 24 25 28   Calcium 8.6 - 10.2 mg/dL 9.3 9.0 8.7     No flowsheet data found.  CBC Latest Ref Rng & Units 01/11/2017 06/27/2008  WBC 3.4 - 10.8 x10E3/uL 9.5 5.3  Hemoglobin 13.0 - 17.7 g/dL 15.3 12.8(L)  Hematocrit 37.5 - 51.0 % 44.6 38.6(L)  Platelets 150 - 379 x10E3/uL 158 113(L)   Lab Results  Component Value Date   MCV 93 01/11/2017   MCV 95.3 06/27/2008   No results found for: TSH No results found for: HGBA1C   BNP No results found for: BNP  ProBNP No results found for: PROBNP   Lipid Panel  No results found for: CHOL, TRIG, HDL, CHOLHDL, VLDL, LDLCALC, LDLDIRECT   RADIOLOGY: No results found.   Additional studies/ records that were reviewed today include:  I reviewed the office notes from his cardiologist, his sleep study, echo, and obtained a download.    ASSESSMENT:    No diagnosis found.   PLAN:  Mr. Greysyn Vanderberg is a 66 year old retired Caucasian male who has a history of prior MI ischemic cardiomyopathy, status post ICD insertion.  His most recent echo continues to show reduced LV function.  He has complex sleep apnea with both obstructive and central events.  The cause of his severe LV dysfunction.  He is not a candidate at present for adaptive servo ventilation. Clinically, he is well compensated and has no signs of overt heart failure.  He was switched to an ResMed AirCurve 10 ST-A10 ST  unit.  His EPAP min pressures 8.  He has variable pressure support ranging from 5-20 cm.  Breath rate is 12 bpm with this therapy.  His AHI has significantly improved down to 5.9.  However, this most likely is artificially increased due to significant leak from his pillow mask.  I have suggested a trial of the new ResMed air at [facility] mask, which is similar to the  Dreamwear mask , which will put he will be able to tolerate better with significant leak reduction.  He feels well on his current therapy.  He has energy.  He denies fatigue.  Recently, he has had multiple grandchildren stay at his house, which has resulted in reduced sleep duration.  His daughters house is increased sleep duration.  At present, there is no residual sleepiness.  I will see him back in one year or sooner for follow-up evaluation.  Medication Adjustments/Labs and Tests Ordered: Current medicines are reviewed at length with the patient today.  Concerns regarding medicines are outlined above.  Medication changes, Labs and Tests ordered today are listed in the Patient Instructions below. Patient Instructions  Medication Instructions:  Your physician recommends that you continue on your current medications as directed. Please refer to the Current Medication list given to you today.  Follow-Up: Your physician wants you to follow-up in: 1 year with Dr. [name] (sleep clinic). You will receive a reminder letter in the mail two months in advance. If you don't receive a letter, please call our office to schedule the follow-up appointment.   Any Other Special Instructions Will Be Listed Below (If Applicable).     If you need a refill on your cardiac medications before your next appointment, please call  your pharmacy.      Signed, Shelva Majestic, MD  07/17/2017 9:49 AM    [facility] [address] [place], [place], [place]  [address] Phone: [phone]

## 2017-07-26 ENCOUNTER — Ambulatory Visit (INDEPENDENT_AMBULATORY_CARE_PROVIDER_SITE_OTHER): Payer: BLUE CROSS/BLUE SHIELD | Admitting: *Deleted

## 2017-07-26 DIAGNOSIS — Z5181 Encounter for therapeutic drug level monitoring: Secondary | ICD-10-CM

## 2017-07-26 DIAGNOSIS — I6350 Cerebral infarction due to unspecified occlusion or stenosis of unspecified cerebral artery: Secondary | ICD-10-CM | POA: Diagnosis not present

## 2017-07-26 DIAGNOSIS — Z7901 Long term (current) use of anticoagulants: Secondary | ICD-10-CM | POA: Diagnosis not present

## 2017-07-26 DIAGNOSIS — I4891 Unspecified atrial fibrillation: Secondary | ICD-10-CM

## 2017-07-26 LAB — POCT INR: INR: 2.4

## 2017-08-08 DIAGNOSIS — G4731 Primary central sleep apnea: Secondary | ICD-10-CM | POA: Diagnosis not present

## 2017-08-20 DIAGNOSIS — Z8673 Personal history of transient ischemic attack (TIA), and cerebral infarction without residual deficits: Secondary | ICD-10-CM | POA: Diagnosis not present

## 2017-08-20 DIAGNOSIS — Z7901 Long term (current) use of anticoagulants: Secondary | ICD-10-CM | POA: Diagnosis not present

## 2017-08-20 DIAGNOSIS — Z7982 Long term (current) use of aspirin: Secondary | ICD-10-CM | POA: Diagnosis not present

## 2017-08-20 DIAGNOSIS — M79662 Pain in left lower leg: Secondary | ICD-10-CM | POA: Diagnosis not present

## 2017-08-20 DIAGNOSIS — W19XXXA Unspecified fall, initial encounter: Secondary | ICD-10-CM | POA: Diagnosis not present

## 2017-08-20 DIAGNOSIS — I10 Essential (primary) hypertension: Secondary | ICD-10-CM | POA: Diagnosis not present

## 2017-08-20 DIAGNOSIS — I252 Old myocardial infarction: Secondary | ICD-10-CM | POA: Diagnosis not present

## 2017-08-20 DIAGNOSIS — S99912A Unspecified injury of left ankle, initial encounter: Secondary | ICD-10-CM | POA: Diagnosis not present

## 2017-08-20 DIAGNOSIS — M7989 Other specified soft tissue disorders: Secondary | ICD-10-CM | POA: Diagnosis not present

## 2017-08-20 DIAGNOSIS — Z79899 Other long term (current) drug therapy: Secondary | ICD-10-CM | POA: Diagnosis not present

## 2017-08-20 DIAGNOSIS — E7800 Pure hypercholesterolemia, unspecified: Secondary | ICD-10-CM | POA: Diagnosis not present

## 2017-08-20 DIAGNOSIS — S93402A Sprain of unspecified ligament of left ankle, initial encounter: Secondary | ICD-10-CM | POA: Diagnosis not present

## 2017-08-21 DIAGNOSIS — M7989 Other specified soft tissue disorders: Secondary | ICD-10-CM | POA: Diagnosis not present

## 2017-08-21 DIAGNOSIS — R9389 Abnormal findings on diagnostic imaging of other specified body structures: Secondary | ICD-10-CM | POA: Diagnosis not present

## 2017-08-21 DIAGNOSIS — M79605 Pain in left leg: Secondary | ICD-10-CM | POA: Diagnosis not present

## 2017-08-21 DIAGNOSIS — R600 Localized edema: Secondary | ICD-10-CM | POA: Diagnosis not present

## 2017-08-23 DIAGNOSIS — K439 Ventral hernia without obstruction or gangrene: Secondary | ICD-10-CM | POA: Diagnosis not present

## 2017-08-24 DIAGNOSIS — S93402A Sprain of unspecified ligament of left ankle, initial encounter: Secondary | ICD-10-CM | POA: Insufficient documentation

## 2017-08-24 DIAGNOSIS — M7122 Synovial cyst of popliteal space [Baker], left knee: Secondary | ICD-10-CM | POA: Diagnosis not present

## 2017-08-24 DIAGNOSIS — M7120 Synovial cyst of popliteal space [Baker], unspecified knee: Secondary | ICD-10-CM | POA: Insufficient documentation

## 2017-08-24 DIAGNOSIS — Z6833 Body mass index (BMI) 33.0-33.9, adult: Secondary | ICD-10-CM | POA: Diagnosis not present

## 2017-08-24 DIAGNOSIS — S93492A Sprain of other ligament of left ankle, initial encounter: Secondary | ICD-10-CM | POA: Diagnosis not present

## 2017-08-25 ENCOUNTER — Telehealth: Payer: Self-pay

## 2017-08-25 NOTE — Telephone Encounter (Signed)
   [facility] Pre-operative Risk Assessment    Request for surgical clearance:  1. What type of surgery is being performed? Ventral Hernia Repair with Possible Mesh   2. When is this surgery scheduled? TBD   3. Are there any medications that need to be held prior to surgery and how long? Please advise on Coumadin    4. Practice name and name of physician performing surgery? [facility] Dr. [name]   5. What is your office phone and fax number? (P) [phone] (F) [phone] ATTNIllene Regulus, CMA   6. Anesthesia type (None, local, MAC, general) ? General Anesthesia   [name] 08/25/2017, 11:23 AM  _________________________________________________________________   (provider comments below)

## 2017-08-28 NOTE — Telephone Encounter (Signed)
   Primary Cardiologist: Dr. Domenic Polite EP: [name]  Chart reviewed as part of pre-operative protocol coverage. [name] was last seen on 05/26/17 by [name]. Also followed by Dr. Domenic Polite (next appt 11/2016). He has history of CAD, chronic systolic CHF/ICM (last EF 25-30%), low flow and swirling noted at apex on echo 12/2016, inducible VT, PAF, CAD with MI s/p PCI (patent stents by cath 2013), HTN, pseudoaneurysm, stroke,severe sleep apnea. Blood pressure traditionally runs on lower side. At last OV with [name], [place] was mildly abnormal; not med changes made at that time. Given complex PMH will forward to Dr. Domenic Polite to find out if he feels patient would benefit from pre-operative office visit to optimize status, instead of clearing by APP phone call. Will also likely need coordination with Coumadin clinic once this is determined for possible bridging given h/o stroke as well as low-flow/swirling previously seen in apex on echo. Dr. Domenic Polite, please route your reply to P CV DIV PREOP, thanks.  Charlie Pitter, PA-C 08/28/2017, 10:06 AM

## 2017-08-30 NOTE — Telephone Encounter (Signed)
   See Dr. Myles Gip reply:  Pre-op covering staff: - Please schedule appointment and call patient to inform them. - Please contact requesting surgeon's office via preferred method (i.e, phone, fax) to inform them of need for appointment prior to surgery.  Charlie Pitter, PA-C  08/30/2017, 9:28 AM

## 2017-08-30 NOTE — Telephone Encounter (Signed)
Please call pt and schedule apt with Dr Domenic Polite, Thanks Hebert Soho

## 2017-08-30 NOTE — Telephone Encounter (Signed)
I would suggest that he have an office visit to see how he is doing clinically and review his medications.  I also agree that the anticoagulation clinic will need to be involved regarding temporary discontinuation of Coumadin.

## 2017-09-04 DIAGNOSIS — H26492 Other secondary cataract, left eye: Secondary | ICD-10-CM | POA: Diagnosis not present

## 2017-09-05 ENCOUNTER — Ambulatory Visit (INDEPENDENT_AMBULATORY_CARE_PROVIDER_SITE_OTHER): Payer: BLUE CROSS/BLUE SHIELD | Admitting: *Deleted

## 2017-09-05 DIAGNOSIS — I6350 Cerebral infarction due to unspecified occlusion or stenosis of unspecified cerebral artery: Secondary | ICD-10-CM | POA: Diagnosis not present

## 2017-09-05 DIAGNOSIS — M1712 Unilateral primary osteoarthritis, left knee: Secondary | ICD-10-CM | POA: Diagnosis not present

## 2017-09-05 DIAGNOSIS — Z7901 Long term (current) use of anticoagulants: Secondary | ICD-10-CM

## 2017-09-05 DIAGNOSIS — M179 Osteoarthritis of knee, unspecified: Secondary | ICD-10-CM | POA: Diagnosis not present

## 2017-09-05 DIAGNOSIS — Z5181 Encounter for therapeutic drug level monitoring: Secondary | ICD-10-CM

## 2017-09-05 DIAGNOSIS — I4891 Unspecified atrial fibrillation: Secondary | ICD-10-CM | POA: Diagnosis not present

## 2017-09-05 DIAGNOSIS — M7122 Synovial cyst of popliteal space [Baker], left knee: Secondary | ICD-10-CM | POA: Diagnosis not present

## 2017-09-05 LAB — POCT INR: INR: 2.7

## 2017-09-05 NOTE — Progress Notes (Signed)
Cardiology Office Note    Date:  09/06/2017   ID:  [name], DOB 12-Jul-1951, MRN [id number]  PCP:  Rory Percy, MD  Cardiologist:  Dr. Domenic Polite EP: [name]  Chief Complaint  Patient presents with  . Follow-up    Visit for Cardiac Clearance    History of Present Illness:    [name] is a 66 y.o. male with past medical history of CAD (s/p BMS to LAD and OM1 with patent stents by cath in 2013), ischemic cardiomyopathy (EF 25-30%, s/p placement of St. Jude ICD with recent gen change in 01/2017), HTN, HLD, PAF (on Coumadin), and OSA (on CPAP) who presents to the office today for cardiac clearance.   He was last examined by Dr. Domenic Polite in 04/2017 and reported doing well from a cardiac perspective at that time. Was continued on his current medication regimen.  The office has been contacted in the interim for cardiac clearance regarding upcoming ventral hernia repair with possible mesh, therefore a close follow-up visit was arranged for cardiac clearance.  In talking with the patient today, he reports overall doing well since his last office visit. He recently returned from a trip to [place] and reports walking approximately 7 miles per day without any anginal symptoms. He denies any recent chest discomfort, dyspnea on exertion, orthopnea, PND, or lower extremity edema. Weight has been stable on his home scales.  He remains on Coumadin for anticoagulation and denies any recent melena, hematochezia, or hematuria.  He is being followed by [name] for a ventral hernia and is planning to undergo surgical repair next month. He does not yet have a surgical date.   Past Medical History:  Diagnosis Date  . A-V fistula ([place])   . AICD (automatic cardioverter/defibrillator) present 2006   ICD  . Anterior myocardial infarction ([place])   . Chronic systolic heart failure ([place])   . Coronary atherosclerosis of native coronary artery    a. s/p BMS to LAD and OM1 b. patent  stents by cath in 2013  . Hypertension   . 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 Ischemic cardiomyopathy    LVEF 30%, status post ICD  . Paroxysmal atrial fibrillation (HCC)    On coumadin  . Pseudoaneurysm ([place])   . Sleep apnea    Unable to wear CPAP  . Stroke [facility])     Past Surgical History:  Procedure Laterality Date  . AICD implantation  2011   St Jude ICD implanted for primary prevention of sudden death  . CATARACT EXTRACTION W/PHACO Left 03/27/2017   Procedure: CATARACT EXTRACTION PHACO AND INTRAOCULAR LENS PLACEMENT (IOC);  Surgeon: [name], MD;  Location: [facility];  Service: Ophthalmology;  Laterality: Left;  CDE: 8.48  . CATARACT EXTRACTION W/PHACO Right 05/29/2017   Procedure: CATARACT EXTRACTION PHACO AND INTRAOCULAR LENS PLACEMENT (IOC);  Surgeon: [name], MD;  Location: [facility];  Service: Ophthalmology;  Laterality: Right;  CDE: 7.48  . ICD GENERATOR CHANGEOUT N/A 01/19/2017   S[facility] Ellipse VR ICD generator change by Dr [name]    Current Medications: Outpatient Medications Prior to Visit  Medication Sig Dispense Refill  . aspirin 81 MG tablet Take 81 mg by mouth daily.      . bisoprolol (ZEBETA) 5 MG tablet TAKE 1 TABLET (5 MG TOTAL) BY MOUTH DAILY. (Patient taking differently: TAKE 1 TABLET (5 MG TOTAL) BY MOUTH DAILY AT BEDTIME) 90 tablet 3  . calcium carbonate (TUMS - DOSED IN MG ELEMENTAL CALCIUM)  500 MG chewable tablet Chew 1-2 tablets by mouth daily as needed for indigestion or heartburn.    . finasteride (PROSCAR) 5 MG tablet Take 5 mg by mouth Daily.     [name] losartan (COZAAR) 50 MG tablet TAKE 1 TABLET BY MOUTH DAILY 90 tablet 3  . nitroGLYCERIN (NITROSTAT) 0.4 MG SL tablet PLACE 1 TABLET (0.4 MG TOTAL) UNDER THE TONGUE EVERY 5 (FIVE) MINUTES AS NEEDED FOR CHEST PAIN. 25 tablet 0  . omega-3 acid ethyl esters (LOVAZA) 1 G capsule Take 1 g by mouth 2 (two) times daily.    . rosuvastatin (CRESTOR) 20 MG tablet TAKE 1 TABLET (20 MG TOTAL) BY MOUTH DAILY. (Patient  taking differently: TAKE 1 TABLET (20 MG TOTAL) BY MOUTH AT BEDTIME) 90 tablet 2  . spironolactone (ALDACTONE) 25 MG tablet TAKE 1 TABLET BY MOUTH DAILY 90 tablet 3  . warfarin (COUMADIN) 5 MG tablet TAKE 1 TABLET DAILY EXCEPT 1/2 A TABLET ON MONDAYS AND FRIDAYS (Patient taking differently: TAKE 1 TABLET (5 mg) DAILY IN THE EVENING EXCEPT 1/2 (2.5 mg)  TABLET ON MONDAYS AND FRIDAYS) 90 tablet 6   No facility-administered medications prior to visit.      Allergies:   Patient has no known allergies.   Social History   Socioeconomic History  . Marital status: Married    Spouse name: None  . Number of children: None  . Years of education: None  . Highest education level: None  Social Needs  . Financial resource strain: None  . Food insecurity - worry: None  . Food insecurity - inability: None  . Transportation needs - medical: None  . Transportation needs - non-medical: None  Occupational History  . None  Tobacco Use  . Smoking status: Never Smoker  . Smokeless tobacco: Never Used  Substance and Sexual Activity  . Alcohol use: Yes    Alcohol/week: 0.0 oz    Comment: occasional  . Drug use: No  . Sexual activity: None  Other Topics Concern  . None  Social History Narrative  . None     Family History:  The patient's family history includes Cancer in his mother; Heart attack in his father; Heart disease in his brother.   Review of Systems:   Please see the history of present illness.     General:  No chills, fever, night sweats or weight changes.  Cardiovascular:  No chest pain, dyspnea on exertion, edema, orthopnea, palpitations, paroxysmal nocturnal dyspnea. Dermatological: No rash, lesions/masses Respiratory: No cough, dyspnea Urologic: No hematuria, dysuria Abdominal:   No nausea, vomiting, diarrhea, bright red blood per rectum, melena, or hematemesis. Positive for occasional abdominal pain.  Neurologic:  No visual changes, wkns, changes in mental status. All other  systems reviewed and are otherwise negative except as noted above.   Physical Exam:    VS:  BP 104/64   Pulse 66   Ht 6\' 1"  (1.854 m)   Wt 238 lb (108 kg)   SpO2 97%   BMI 31.40 kg/m    General: Well developed, well nourished Caucasian male appearing in no acute distress. Head: Normocephalic, atraumatic, sclera non-icteric, no xanthomas, nares are without discharge.  Neck: No carotid bruits. JVD not elevated.  Lungs: Respirations regular and unlabored, without wheezes or rales.  Heart: Regular rate and rhythm. No S3 or S4.  No murmur, no rubs, or gallops appreciated. ICD site without erythema.  Abdomen: Soft, non-tender, non-distended with normoactive bowel sounds. No hepatomegaly. No rebound/guarding. No obvious abdominal masses.  Msk:  Strength and tone appear normal for age. No joint deformities or effusions. Extremities: No clubbing or cyanosis. No lower extremity edema.  Distal pedal pulses are 2+ bilaterally. Neuro: Alert and oriented X 3. Moves all extremities spontaneously. No focal deficits noted. Psych:  Responds to questions appropriately with a normal affect. Skin: No rashes or lesions noted  Wt Readings from Last 3 Encounters:  09/06/17 238 lb (108 kg)  07/17/17 236 lb 6.4 oz (107.2 kg)  05/29/17 235 lb (106.6 kg)     Studies/Labs Reviewed:   EKG:  EKG is not ordered today. EKG from 07/17/2017 is reviewed which shows NSR, HR 61, with no acute ST or T-wave changes.   Recent Labs: 01/11/2017: BUN 19; Creatinine, Ser 1.09; Hemoglobin 15.3; Platelets 158; Potassium 4.6; Sodium 140   Lipid Panel No results found for: CHOL, TRIG, HDL, CHOLHDL, VLDL, LDLCALC, LDLDIRECT  Additional studies/ records that were reviewed today include:   Echocardiogram: 12/2016 Study Conclusions  - Left ventricle: The cavity size was normal. Wall thickness was   increased in a pattern of mild LVH. Systolic function was   severely reduced. The estimated ejection fraction was in the    range of 25% to 30%. There is akinesis of the   mid-apicalanteroseptal, anterior, and apical myocardium. There is   akinesis of the apicalinferior myocardium. No definite formed LV   mural thrombus noted. There is significant low flow and swirling   noted at the apex with Definity contrast. Doppler parameters are   consistent with abnormal left ventricular relaxation (grade 1   diastolic dysfunction). - Aortic valve: Trileaflet; mildly calcified leaflets. - Mitral valve: There was trivial regurgitation. - Left atrium: The atrium was mildly to moderately dilated. - Right ventricle: Pacer wire or catheter noted in right ventricle. - Right atrium: The atrium was mildly dilated. - Tricuspid valve: There was trivial regurgitation. - Pulmonary arteries: PA peak pressure: 26 mm Hg (S). - Pericardium, extracardiac: There was no pericardial effusion.  Impressions:  - Mild LVH with LVEF 25-30%. Large region of mid to apical   anteroseptal, anterior, and inferior apical akinesis consistent   with ischemic cardiomyopathy. No definite formed LV mural   thrombus noted. There is significant low flow and swirling noted   at the apex with Definity contrast. Grade 1 diastolic   dysfunction. Trivial mitral regurgitation. Mild to moderate left   atrial enlargement. Mildly sclerotic aortic valve. Device wire   noted within the right heart. Trivial tricuspid regurgitation   with PASP 26 mmHg.   Assessment:    1. Preoperative cardiovascular examination   2. Coronary artery disease involving native coronary artery of native heart without angina pectoris   3. Cardiomyopathy, ischemic   4. Chronic combined systolic and diastolic heart failure (HCC)   5. Paroxysmal atrial fibrillation ([place])   6. Essential hypertension   7. Mixed hyperlipidemia   8. OSA (obstructive sleep apnea)      Plan:   In order of problems listed above:  1. Preoperative Cardiac Clearance for Left Ventral Hernia Repair -  the patient has known CAD and ischemic cardiomyopathy with a reduced EF of 25-30%. Recent NST in 04/2016 showed no significant ischemia. He denies any recent anginal symptoms and was recently walking 7+ miles per day while on vacation without any chest discomfort or dyspnea on exertion.  - recent EKG showed no acute ischemic changes. While his RCRI risk score is 11% for risk of major cardiac events, he is optimized on medical  therapy and appears stable from a cardiac perspective. He is of acceptable risk to proceed with hernia repair without further cardiovascular testing. Will make the Coumadin Clinic aware of need to hold Coumadin for 5 days prior to his surgery as he has a history of CVA and will require Lovenox bridging. Will forward today's note to [name] and the schedulers for [facility] Surgery.   2. CAD - s/p BMS to LAD and OM1 with patent stents by cath in 2013. Recent NST in 04/2016 showed prior scar but no significant ischemia.  - he denies any recent chest pain or dyspnea on exertion.  - continue BB and statin therapy. Remains on ASA along with Coumadin.   3. Chronic Combined Systolic and Diastolic CHF/ Ischemic cardiomyopathy - EF 25-30%, s/p placement of St. Jude ICD with recent gen change in 01/2017. - he denies any recent orthopnea, PND, or lower extremity edema. Does not appear volume overloaded by physical examination.  - continue Bisoprolol, Losartan, and Spironolactone.   4. PAF  - he denies any recent palpitations. Remains on Bisoprolol for rate-control. - he denies any evidence of active bleeding. Continue Coumadin for anticoagulation. Will need to hold Coumadin for 5 days prior to his surgery (surgery not yet scheduled). Will likely require Lovenox bridging with his history of CVA. Will notify Coumadin Clinic.   5. HTN - BP is well-controlled at 104/64 during today's visit. - continue current medication regimen.   6. HLD - Cholesterol followed by PCP. Goal  LDL is < 70 with known CAD.  - Remains on Crestor 20mg  daily.  7. OSA - continued compliance with CPAP encouraged.      Medication Adjustments/Labs and Tests Ordered: Current medicines are reviewed at length with the patient today.  Concerns regarding medicines are outlined above.  Medication changes, Labs and Tests ordered today are listed in the Patient Instructions below. Patient Instructions  Medication Instructions:  Your physician recommends that you continue on your current medications as directed. Please refer to the Current Medication list given to you today.   Labwork: NONE   Testing/Procedures: NONE   Follow-Up: Your physician recommends that you schedule a follow-up appointment with Dr. Domenic Polite  Any Other Special Instructions Will Be Listed Below (If Applicable).  You have been cleared for surgery. Please call with surgery date.    If you need a refill on your cardiac medications before your next appointment, please call your pharmacy.  Thank you for choosing [facility]!    Signed, Erma Heritage, PA-C  09/06/2017 2:08 PM    [facility] [place], [place], [place]  [address] Phone: [phone]; Fax: [phone]  [address] [place], [place] [address] Phone: [phone]

## 2017-09-06 ENCOUNTER — Encounter: Payer: Self-pay | Admitting: Student

## 2017-09-06 ENCOUNTER — Ambulatory Visit (INDEPENDENT_AMBULATORY_CARE_PROVIDER_SITE_OTHER): Payer: BLUE CROSS/BLUE SHIELD | Admitting: Student

## 2017-09-06 VITALS — BP 104/64 | HR 66 | Ht 73.0 in | Wt 238.0 lb

## 2017-09-06 DIAGNOSIS — G4733 Obstructive sleep apnea (adult) (pediatric): Secondary | ICD-10-CM

## 2017-09-06 DIAGNOSIS — I2510 Atherosclerotic heart disease of native coronary artery without angina pectoris: Secondary | ICD-10-CM

## 2017-09-06 DIAGNOSIS — I255 Ischemic cardiomyopathy: Secondary | ICD-10-CM | POA: Diagnosis not present

## 2017-09-06 DIAGNOSIS — Z01810 Encounter for preprocedural cardiovascular examination: Secondary | ICD-10-CM

## 2017-09-06 DIAGNOSIS — I5042 Chronic combined systolic (congestive) and diastolic (congestive) heart failure: Secondary | ICD-10-CM

## 2017-09-06 DIAGNOSIS — E782 Mixed hyperlipidemia: Secondary | ICD-10-CM

## 2017-09-06 DIAGNOSIS — I10 Essential (primary) hypertension: Secondary | ICD-10-CM

## 2017-09-06 DIAGNOSIS — I480 Paroxysmal atrial fibrillation: Secondary | ICD-10-CM | POA: Diagnosis not present

## 2017-09-06 NOTE — Patient Instructions (Addendum)
Medication Instructions:  Your physician recommends that you continue on your current medications as directed. Please refer to the Current Medication list given to you today.   Labwork: NONE   Testing/Procedures: NONE   Follow-Up: Your physician recommends that you schedule a follow-up appointment with Dr. Domenic Polite   Any Other Special Instructions Will Be Listed Below (If Applicable).  You have been cleared for surgery. Please call with surgery date.    If you need a refill on your cardiac medications before your next appointment, please call your pharmacy.  Thank you for choosing [facility]!

## 2017-09-07 DIAGNOSIS — G4731 Primary central sleep apnea: Secondary | ICD-10-CM | POA: Diagnosis not present

## 2017-09-18 DIAGNOSIS — G4733 Obstructive sleep apnea (adult) (pediatric): Secondary | ICD-10-CM | POA: Diagnosis not present

## 2017-09-25 ENCOUNTER — Ambulatory Visit (INDEPENDENT_AMBULATORY_CARE_PROVIDER_SITE_OTHER): Payer: BLUE CROSS/BLUE SHIELD | Admitting: *Deleted

## 2017-09-25 DIAGNOSIS — I255 Ischemic cardiomyopathy: Secondary | ICD-10-CM

## 2017-09-25 NOTE — Progress Notes (Signed)
Remote ICD transmission.   

## 2017-09-26 ENCOUNTER — Encounter: Payer: Self-pay | Admitting: Cardiology

## 2017-10-02 ENCOUNTER — Other Ambulatory Visit: Payer: Self-pay | Admitting: General Surgery

## 2017-10-02 LAB — CUP PACEART REMOTE DEVICE CHECK
Battery Remaining Longevity: 98 mo
Battery Voltage: 3.17 V
Brady Statistic RV Percent Paced: 2.3 %
HIGH POWER IMPEDANCE MEASURED VALUE: 46 Ohm
HIGH POWER IMPEDANCE MEASURED VALUE: 46 Ohm
Lead Channel Pacing Threshold Amplitude: 0.75 V
Lead Channel Pacing Threshold Pulse Width: 0.5 ms
Lead Channel Sensing Intrinsic Amplitude: 12 mV
Lead Channel Setting Pacing Amplitude: 2.5 V
Lead Channel Setting Sensing Sensitivity: 0.5 mV
MDC IDC MSMT BATTERY REMAINING PERCENTAGE: 93 %
MDC IDC MSMT LEADCHNL RV IMPEDANCE VALUE: 640 Ohm
MDC IDC PG IMPLANT DT: 20180503
MDC IDC PG SERIAL: 7421148
MDC IDC SESS DTM: 20190107090017
MDC IDC SET LEADCHNL RV PACING PULSEWIDTH: 0.5 ms

## 2017-10-04 ENCOUNTER — Telehealth: Payer: Self-pay | Admitting: Cardiology

## 2017-10-04 NOTE — Telephone Encounter (Signed)
Pt is scheduled for hernia surgery on 2.4.19 --would like to know what he needs to do w/ his coumadin, please give him a call on his cell @ [phone]

## 2017-10-04 NOTE — Telephone Encounter (Signed)
Spoke with pt.  Told him to keep his INR appt on 10/17/17 and I would give him all of his lovenox instruction on that day and send in his Rx.  He verbalized understanding.

## 2017-10-08 DIAGNOSIS — G4731 Primary central sleep apnea: Secondary | ICD-10-CM | POA: Diagnosis not present

## 2017-10-10 ENCOUNTER — Other Ambulatory Visit: Payer: Self-pay | Admitting: Cardiology

## 2017-10-10 MED ORDER — BISOPROLOL FUMARATE 5 MG PO TABS: ORAL_TABLET | ORAL | 1 refills | Status: DC

## 2017-10-10 NOTE — Telephone Encounter (Signed)
°*  STAT* If patient is at the pharmacy, call can be transferred to refill team.   1. Which medications need to be refilled? bisoprolol (ZEBETA) 5 MG tablet   2. Which pharmacy/location (including street and city if local pharmacy) is medication to be sent to[facility] [place]  3. Do they need a 30 day or 90 day supply? [place]

## 2017-10-10 NOTE — Telephone Encounter (Signed)
Medication sent to pharmacy  

## 2017-10-13 ENCOUNTER — Telehealth: Payer: Self-pay | Admitting: Cardiology

## 2017-10-13 MED ORDER — BISOPROLOL FUMARATE 5 MG PO TABS: ORAL_TABLET | ORAL | 0 refills | Status: DC

## 2017-10-13 NOTE — Telephone Encounter (Signed)
[name] walked in stating that [facility] that they are on a back order with his RX of bisoprolol (ZEBETA) 5 MG tablet  He states that he has not taken this medication in 4 days. Please call patient's cell #

## 2017-10-13 NOTE — Telephone Encounter (Signed)
Medication sent to [facility]

## 2017-10-17 ENCOUNTER — Ambulatory Visit (INDEPENDENT_AMBULATORY_CARE_PROVIDER_SITE_OTHER): Payer: BLUE CROSS/BLUE SHIELD | Admitting: *Deleted

## 2017-10-17 DIAGNOSIS — I4891 Unspecified atrial fibrillation: Secondary | ICD-10-CM

## 2017-10-17 DIAGNOSIS — Z5181 Encounter for therapeutic drug level monitoring: Secondary | ICD-10-CM | POA: Diagnosis not present

## 2017-10-17 DIAGNOSIS — I6350 Cerebral infarction due to unspecified occlusion or stenosis of unspecified cerebral artery: Secondary | ICD-10-CM

## 2017-10-17 DIAGNOSIS — Z7901 Long term (current) use of anticoagulants: Secondary | ICD-10-CM

## 2017-10-17 LAB — POCT INR: INR: 3.2

## 2017-10-17 MED ORDER — ENOXAPARIN SODIUM 150 MG/ML ~~LOC~~ SOLN: 150.0000 mg | SUBCUTANEOUS | 0 refills | Status: DC

## 2017-10-17 NOTE — Patient Instructions (Signed)
10/23/17  Scheduled for laproscopic hernia repair Wt 104kg  SCr 1.09  CrCl 96.74  Hgb 15.3  Hct 44.6  1/29  INR 3.2  Last dose of coumadin (2.5mg ) 1/30  No lovenox or coumadin 1/31  Lovenox 150mg  sq at 7am 2/1   Lovenox 150mg  sq at 7am 2/2   Lovenox 150mg  sq at 7am 2/3   Lovenox 150mg  sq at 7am 2/4  No lovenox------procedure------coumadin 5mg  pm 2/5   Lovenox 150mg  sq at 7am and coumadin 5mg  pm 2/6  Lovenox 150mg  sq at 7am and coumadin 5mg  pm 2/7  Lovenox 150mg  sq at 7am and coumadin 5mg  pm 2/8  Lovenox 150mg  sq at 7am and coumadin 5mg  pm 2/9  Lovenox 150mg  sq at 7am and coumadin 5mg  pm 2/10  Lovenox 150mg  sq at 7am and coumadin 5mg  pm 2/11  INR appt @ 11:45am in [place]

## 2017-10-18 NOTE — Pre-Procedure Instructions (Signed)
[name]  10/18/2017      [id number] [place] [place] [address] [place] [address] Phone: [phone] Fax: [phone]  [place], [place] [address] Phone: [phone] Fax: [phone]    Your procedure is scheduled on October 23, 2017.  Report to [facility] Admitting at 530 AM.  Call this number if you have problems the morning of surgery:  [phone]   Remember:  Do not eat food or drink liquids after midnight.  Take these medicines the morning of surgery with A SIP OF WATER bisoprolol (zebeta), finasteride (proscar).  Stop/continue lovenox and warfarin (coumadin) as instructed by your surgeon)  7 days prior to surgery STOP taking any Aspirin (unless otherwise instructed by your surgeon), Aleve, Naproxen, Ibuprofen, Motrin, Advil, Goody's, BC's, all herbal medications, fish oil, and all vitamins  Continue all other medications as instructed by your physician except follow the above medication instructions before surgery   Do not wear jewelry.  Do not wear lotions, powders, or colognes, or deodorant.  Men may shave face and neck.  Do not bring valuables to the hospital.  [facility] is not responsible for any belongings or valuables.  Contacts, dentures or bridgework may not be worn into surgery.  Leave your suitcase in the car.  After surgery it may be brought to your room.  For patients admitted to the hospital, discharge time will be determined by your treatment team.  Patients discharged the day of surgery will not be allowed to drive home.   Special instructions:  [facility]- Preparing For Surgery  Before surgery, you can play an important role. Because skin is not sterile, your skin needs to be as free of germs as possible. You can reduce the number of germs on your skin by washing with CHG (chlorahexidine  gluconate) Soap before surgery.  CHG is an antiseptic cleaner which kills germs and bonds with the skin to continue killing germs even after washing.  Please do not use if you have an allergy to CHG or antibacterial soaps. If your skin becomes reddened/irritated stop using the CHG.  Do not shave (including legs and underarms) for at least 48 hours prior to first CHG shower. It is OK to shave your face.  Please follow these instructions carefully.   1. Shower the NIGHT BEFORE SURGERY and the MORNING OF SURGERY with CHG.   2. If you chose to wash your hair, wash your hair first as usual with your normal shampoo.  3. After you shampoo, rinse your hair and body thoroughly to remove the shampoo.  4. Use CHG as you would any other liquid soap. You can apply CHG directly to the skin and wash gently with a scrungie or a clean washcloth.   5. Apply the CHG Soap to your body ONLY FROM THE NECK DOWN.  Do not use on open wounds or open sores. Avoid contact with your eyes, ears, mouth and genitals (private parts). Wash Face and genitals (private parts)  with your normal soap.  6. Wash thoroughly, paying special attention to the area where your surgery will be performed.  7. Thoroughly rinse your body with warm water from the neck down.  8. DO NOT shower/wash with your normal soap after using and rinsing off the CHG Soap.  9. Pat yourself dry with  a CLEAN TOWEL.  10. Wear CLEAN PAJAMAS to bed the night before surgery, wear comfortable clothes the morning of surgery  11. Place CLEAN SHEETS on your bed the night of your first shower and DO NOT SLEEP WITH PETS.  Day of Surgery: Do not apply any deodorants/lotions. Please wear clean clothes to the hospital/surgery center.    Please read over the following fact sheets that you were given. Pain Booklet, Coughing and Deep Breathing and Surgical Site Infection Prevention

## 2017-10-19 ENCOUNTER — Telehealth: Payer: Self-pay | Admitting: *Deleted

## 2017-10-19 ENCOUNTER — Encounter (HOSPITAL_COMMUNITY): Payer: Self-pay

## 2017-10-19 ENCOUNTER — Encounter (HOSPITAL_COMMUNITY)
Admission: RE | Admit: 2017-10-19 | Discharge: 2017-10-19 | Disposition: A | Discharge status: Home / Self Care | Payer: BLUE CROSS/BLUE SHIELD | Source: Ambulatory Visit | Attending: General Surgery | Admitting: General Surgery

## 2017-10-19 ENCOUNTER — Other Ambulatory Visit: Payer: Self-pay | Admitting: Cardiology

## 2017-10-19 ENCOUNTER — Other Ambulatory Visit (HOSPITAL_COMMUNITY): Payer: BLUE CROSS/BLUE SHIELD

## 2017-10-19 DIAGNOSIS — Z955 Presence of coronary angioplasty implant and graft: Secondary | ICD-10-CM | POA: Insufficient documentation

## 2017-10-19 DIAGNOSIS — I252 Old myocardial infarction: Secondary | ICD-10-CM | POA: Diagnosis not present

## 2017-10-19 DIAGNOSIS — K439 Ventral hernia without obstruction or gangrene: Secondary | ICD-10-CM | POA: Insufficient documentation

## 2017-10-19 DIAGNOSIS — E785 Hyperlipidemia, unspecified: Secondary | ICD-10-CM | POA: Diagnosis not present

## 2017-10-19 DIAGNOSIS — Z95810 Presence of automatic (implantable) cardiac defibrillator: Secondary | ICD-10-CM | POA: Insufficient documentation

## 2017-10-19 DIAGNOSIS — I2510 Atherosclerotic heart disease of native coronary artery without angina pectoris: Secondary | ICD-10-CM | POA: Diagnosis not present

## 2017-10-19 DIAGNOSIS — Z9849 Cataract extraction status, unspecified eye: Secondary | ICD-10-CM | POA: Insufficient documentation

## 2017-10-19 DIAGNOSIS — I255 Ischemic cardiomyopathy: Secondary | ICD-10-CM | POA: Insufficient documentation

## 2017-10-19 DIAGNOSIS — Z7982 Long term (current) use of aspirin: Secondary | ICD-10-CM | POA: Insufficient documentation

## 2017-10-19 DIAGNOSIS — I110 Hypertensive heart disease with heart failure: Secondary | ICD-10-CM | POA: Insufficient documentation

## 2017-10-19 DIAGNOSIS — Z01812 Encounter for preprocedural laboratory examination: Secondary | ICD-10-CM | POA: Insufficient documentation

## 2017-10-19 DIAGNOSIS — I480 Paroxysmal atrial fibrillation: Secondary | ICD-10-CM | POA: Insufficient documentation

## 2017-10-19 DIAGNOSIS — I5042 Chronic combined systolic (congestive) and diastolic (congestive) heart failure: Secondary | ICD-10-CM | POA: Insufficient documentation

## 2017-10-19 DIAGNOSIS — Z79899 Other long term (current) drug therapy: Secondary | ICD-10-CM | POA: Insufficient documentation

## 2017-10-19 DIAGNOSIS — G4733 Obstructive sleep apnea (adult) (pediatric): Secondary | ICD-10-CM | POA: Diagnosis not present

## 2017-10-19 DIAGNOSIS — Z7901 Long term (current) use of anticoagulants: Secondary | ICD-10-CM | POA: Insufficient documentation

## 2017-10-19 DIAGNOSIS — Z8673 Personal history of transient ischemic attack (TIA), and cerebral infarction without residual deficits: Secondary | ICD-10-CM | POA: Diagnosis not present

## 2017-10-19 DIAGNOSIS — Z01818 Encounter for other preprocedural examination: Secondary | ICD-10-CM | POA: Diagnosis not present

## 2017-10-19 HISTORY — DX: Personal history of other diseases of the digestive system: Z87.19

## 2017-10-19 LAB — CBC
HCT: 46.3 % (ref 39.0–52.0)
Hemoglobin: 15.3 g/dL (ref 13.0–17.0)
MCH: 31.7 pg (ref 26.0–34.0)
MCHC: 33 g/dL (ref 30.0–36.0)
MCV: 95.9 fL (ref 78.0–100.0)
PLATELETS: 156 10*3/uL (ref 150–400)
RBC: 4.83 MIL/uL (ref 4.22–5.81)
RDW: 13.6 % (ref 11.5–15.5)
WBC: 7.9 10*3/uL (ref 4.0–10.5)

## 2017-10-19 LAB — PROTIME-INR
INR: 2.14
PROTHROMBIN TIME: 23.8 s — AB (ref 11.4–15.2)

## 2017-10-19 LAB — BASIC METABOLIC PANEL
ANION GAP: 9 (ref 5–15)
BUN: 19 mg/dL (ref 6–20)
CALCIUM: 9.4 mg/dL (ref 8.9–10.3)
CO2: 25 mmol/L (ref 22–32)
Chloride: 104 mmol/L (ref 101–111)
Creatinine, Ser: 1.15 mg/dL (ref 0.61–1.24)
GFR calc Af Amer: >60 mL/min (ref >60)
Glucose, Bld: 97 mg/dL (ref 65–99)
POTASSIUM: 4.4 mmol/L (ref 3.5–5.1)
SODIUM: 138 mmol/L (ref 135–145)

## 2017-10-19 LAB — APTT: APTT: 50 s — AB (ref 24–36)

## 2017-10-19 MED ORDER — ENSURE PRE-SURGERY PO LIQD
592.0000 mL | Freq: Once | ORAL | Status: DC
  Filled 2017-10-19: qty 592

## 2017-10-19 NOTE — Telephone Encounter (Signed)
Pt went for surgical pre-op today.  Pre-admit nurse told him to stop Baby ASA until after surgery.  Told pt that should be fine since he is on Lovenox while off coumadin.

## 2017-10-19 NOTE — Telephone Encounter (Signed)
Please give pt a call concerning his Lovenox.

## 2017-10-20 NOTE — Progress Notes (Signed)
Aaron Edelman at [facility] notified of surgery day and time. Form faxed also

## 2017-10-20 NOTE — Progress Notes (Signed)
Anesthesia Chart Review: Patient is a 67 year old male scheduled for diagnostic laparoscopy, ventral hernia repair, possible mesh on 10/23/17 by Dr. [name].   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   - PCP is Dr. Rory Percy. - Cardiologist is Dr. Rozann Lesches. Last visit 09/06/17 with Bernerd Pho, PA-C for pre-operative evaluation. She wrote, "Preoperative Cardiac Clearance for Left Ventral Hernia Repair - the patient has known CAD and ischemic cardiomyopathy with a reduced EF of 25-30%. Recent NST in 04/2016 showed no significant ischemia. He denies any recent anginal symptoms and was recently walking 7+ miles per day while on vacation without any chest discomfort or dyspnea on exertion.  - recent EKG showed no acute ischemic changes. While his RCRI risk score is 11% for risk of major cardiac events, he is optimized on medical therapy and appears stable from a cardiac perspective. He is of acceptable risk to proceed with hernia repair without further cardiovascular testing. Will make the Coumadin Clinic aware of need to hold Coumadin for 5 days prior to his surgery as he has a history of CVA and will require Lovenox bridging." - EP cardiologist is Dr. Thompson Grayer. Last visit 05/26/17. Normal ICD function. Afib well controlled. On warfarin (has declined NOAC previously). - He had sleep evaluation by cardiologist Dr. Shelva Majestic. Last visit 07/17/17.  Meds include ASA 81 mg (hold 10/19/17), bisoprolol, Lovenox (start  10/19/17 AM; last dose pre-op 10/22/17 AM), finasteride, losartan, Nitro, Lovaza, Crestor, Aldactone, warfarin (last dose 10/17/17).  BP 103/72   Pulse (!) 51   Temp 36.6 C   Resp 20   Ht 6\' 1"  (1.854 m)   Wt 236 lb (107 kg)   SpO2 98%   BMI 31.14 kg/m   EKG 07/17/17: NSR, LAD, low voltage QRS, septal infarct (age undetermined).  Echo 01/09/17: Study Conclusions - Left ventricle: The cavity size was normal. Wall thickness was   increased in a pattern of mild LVH. Systolic function was   severely reduced. The estimated ejection fraction was in the   range of 25% to 30%. There is akinesis of the   mid-apicalanteroseptal, anterior, and apical myocardium. There is   akinesis of the apicalinferior myocardium. No definite formed LV   mural thrombus noted. There is significant low flow and swirling   noted at the apex with Definity contrast. Doppler parameters are   consistent with abnormal left ventricular relaxation (grade 1   diastolic dysfunction). - Aortic valve: Trileaflet; mildly calcified leaflets. - Mitral valve: There was trivial regurgitation. - Left atrium: The atrium was mildly to moderately dilated. - Right ventricle: Pacer wire or catheter noted in right ventricle. - Right atrium: The atrium was mildly dilated. - Tricuspid valve: There was trivial regurgitation. - Pulmonary arteries: PA peak pressure: 26 mm Hg (S). - Pericardium, extracardiac: There was no pericardial effusion. Impressions: - Mild LVH with LVEF 25-30%. Large region of mid to apical   anteroseptal, anterior, and inferior apical akinesis consistent   with ischemic cardiomyopathy. No definite formed LV mural   thrombus noted. There  is significant low flow and swirling noted   at the apex with Definity contrast. Grade 1 diastolic   dysfunction. Trivial mitral regurgitation. Mild to moderate left   atrial enlargement. Mildly sclerotic aortic valve. Device wire   noted within the right heart. Trivial tricuspid  regurgitation   with PASP 26 mmHg.  Nuclear stress test 04/26/16:  There was no ST segment deviation noted during stress.  Findings consistent with large prior apical and anteroapical myocardial infarction. There is no significant peri-infarct ischemia.  The left ventricular ejection fraction is moderately decreased (30-44%).  This is an intermediate risk study. Intermediate risk based on decreased LVEF and prior infarct, no significant current myocardium at jeopardy.  Cardiac cath 07/20/12:  Angiographic Findings: Left main: No obstructive disease.  Left Anterior Descending Artery: Large caliber vessel that courses to the apex. There is mild plaque in the proximal segment. The mid vessel has a patent stent with mild, 10% in-stent restenosis. There are two small caliber diagonal branches with no disease.  [place] Intermediate: Moderate caliber vessel with mild plaque disease.  Circumflex Artery: Moderate caliber vessel with a stent extending from the mid vessel into the first obtuse marginal branch. The stent has 30% restenosis in the mid portion of the stent. This appears unchanged from his catheterization last year.  Right Coronary Artery: Moderate caliver vessel with mild plaque disease mid vessel.  Left Ventricular Angiogram: LVEF=25-30% with akinesis of the anterior wall, apex and inferoapex.  Impression: 1. Double vessel CAD with patent stents mid LAD and mid OM1.  2. Severe LV systolic dysfunction 3. Ischemic cardiomyopathy Recommendations: Continue medical management.   Cardiac event monitor 05/18/11 - 06/17/11: NSR. No significant arrhythmias. No definite correlation between symptoms and any rhythm abnormalities.  Preoperative labs noted. CBC and BMET WNL. PT 23.8, INR 2.14, PTT 50. Warfarin/Lovenox pre-op instructions discussed above. INR and PTT results called to [facility] triage nurse Abigail Butts. Will enter orders for repeat PT/PTT on the day of surgery.   If follow-up labs acceptable and  otherwise no acute changes then I would anticipate that he can proceed as planned.  [facility] Short Stay Center/Anesthesiology Phone [phone] 10/20/2017 10:47 AM

## 2017-10-20 NOTE — Progress Notes (Signed)
Cardiology Office Note  Date: 10/24/2017   ID: [name], DOB 1950-11-08, MRN [id number]  PCP: Rory Percy, MD  Primary Cardiologist: Rozann Lesches, MD   Chief Complaint  Patient presents with  . Cardiomyopathy    History of Present Illness: [name] is a 67 y.o. male last seen by [name] PA-C in December 2018.  He had preoperative cardiac clearance at that time in anticipation of laparoscopy and ventral hernia repair on February 4 with [name].  He presents today for a routine visit, states that he tolerated surgery yesterday, somewhat sore today.  He is on Coumadin with follow-up in the anticoagulation clinic, having Lovenox bridge while off Coumadin for surgery.  We went over his anticoagulation schedule which is written out.  He does not report any unusual bleeding.  Has some bruising at his Lovenox injection site.  He maintains follow-up with [name] in the device clinic, Russell ICD in place.  Recent remote interrogation showed normal device function and no shocks.  He has had no syncope.  We went over his cardiac regimen again today.  We have kept him on both aspirin and Coumadin with history of CAD, cardiomyopathy, PAF, and stroke.  He has tolerated this combination well.  We have also discussed other DOAC options for anticoagulation, but he has preferred to stay on Coumadin.  Other thought is to switch him from Cozaar to [place], although this would be limited due to relatively low blood pressures.  Past Medical History:  Diagnosis Date  . A-V fistula ([place])   . AICD (automatic cardioverter/defibrillator) present 2006   ICD  . Anterior myocardial infarction ([place])   . Chronic systolic heart failure ([place])   . Coronary atherosclerosis of native coronary artery    a. s/p BMS to LAD and OM1 b. patent stents by cath in 2013  . History of hiatal hernia   . Hypertension   . Inducible ventricular tachycardia ([place])   . Ischemic cardiomyopathy    LVEF 30%, status post ICD  . Paroxysmal atrial fibrillation ([facility])    On coumadin  . Pseudoaneurysm ([name])   . Sleep apnea    cpap  . Stroke [facility])     Past Surgical History:  Procedure Laterality Date  . AICD implantation  2011   St Jude ICD implanted for primary prevention of sudden death  . CATARACT EXTRACTION W/PHACO Left 03/27/2017   Procedure: CATARACT EXTRACTION PHACO AND INTRAOCULAR LENS PLACEMENT (IOC);  Surgeon: [name], MD;  Location: [facility];  Service: Ophthalmology;  Laterality: Left;  CDE: 8.48  . CATARACT EXTRACTION W/PHACO Right 05/29/2017   Procedure: CATARACT EXTRACTION PHACO AND INTRAOCULAR LENS PLACEMENT (IOC);  Surgeon: [name], MD;  Location: [facility];  Service: Ophthalmology;  Laterality: Right;  CDE: 7.48  . EYE SURGERY    . ICD GENERATOR CHANGEOUT N/A 01/19/2017   SJM [place] VR ICD generator change by Dr [name]  . LAPAROSCOPY N/A 10/23/2017   Procedure: LAPAROSCOPY DIAGNOSTIC;  Surgeon: [name], MD;  Location: [place];  Service: General;  Laterality: N/A;  . VENTRAL HERNIA REPAIR N/A 10/23/2017   Procedure: [place];  Surgeon: [name], MD;  Location: [place];  Service: General;  Laterality: N/A;    Current Outpatient Medications  Medication Sig Dispense Refill  . aspirin 81 MG tablet Take 81 mg by mouth daily.      . bisoprolol (ZEBETA) 5 MG tablet TAKE 1 TABLET (5 MG TOTAL) BY MOUTH DAILY.  90 tablet 0  . calcium carbonate (TUMS - DOSED IN MG ELEMENTAL CALCIUM) 500 MG chewable tablet Chew 1-2 tablets by mouth daily as needed for indigestion or heartburn.    . enoxaparin (LOVENOX) 150 MG/ML injection Inject 1 mL (150 mg total) into the skin daily for 10 days. 10 Syringe 0  . finasteride (PROSCAR) 5 MG tablet Take 5 mg by mouth Daily.     [name] losartan (COZAAR) 50 MG tablet TAKE 1 TABLET BY MOUTH DAILY 90 tablet 3  . nitroGLYCERIN (NITROSTAT) 0.4 MG SL tablet PLACE 1 TABLET (0.4 MG TOTAL) UNDER THE TONGUE EVERY 5 (FIVE) MINUTES AS NEEDED  FOR CHEST PAIN. 25 tablet 0  . omega-3 acid ethyl esters (LOVAZA) 1 G capsule Take 1 g by mouth 2 (two) times daily.    [name] oxyCODONE (OXY IR/ROXICODONE) 5 MG immediate release tablet Take 1 tablet (5 mg total) by mouth every 6 (six) hours as needed for moderate pain, severe pain or breakthrough pain. 15 tablet 0  . rosuvastatin (CRESTOR) 20 MG tablet TAKE 1 TABLET (20 MG TOTAL) BY MOUTH DAILY. 90 tablet 1  . spironolactone (ALDACTONE) 25 MG tablet TAKE 1 TABLET BY MOUTH DAILY 90 tablet 3  . warfarin (COUMADIN) 5 MG tablet TAKE 1 TABLET DAILY EXCEPT 1/2 A TABLET ON MONDAYS AND FRIDAYS (Patient taking differently: TAKE 1 TABLET (5 mg) DAILY IN THE EVENING EXCEPT 1/2 (2.5 mg)  TABLET ON MONDAYS AND FRIDAYS) 90 tablet 6   No current facility-administered medications for this visit.    Allergies:  Patient has no known allergies.   Social History: The patient  reports that  has never smoked. he has never used smokeless tobacco. He reports that he drinks alcohol. He reports that he does not use drugs.   ROS:  Please see the history of present illness. Otherwise, complete review of systems is positive for none.  All other systems are reviewed and negative.   Physical Exam: VS:  BP 115/70   Pulse 68   Ht 6\' 1"  (1.854 m)   Wt 241 lb (109.3 kg)   SpO2 96%   BMI 31.80 kg/m , BMI Body mass index is 31.8 kg/m.  Wt Readings from Last 3 Encounters:  10/24/17 241 lb (109.3 kg)  10/19/17 236 lb (107 kg)  09/06/17 238 lb (108 kg)    General: Overweight male, appears comfortable at rest. HEENT: Conjunctiva and lids normal, oropharynx clear. Neck: Supple, no elevated JVP or carotid bruits, no thyromegaly. Lungs: Clear to auscultation, nonlabored breathing at rest. Cardiac: Regular rate and rhythm, no S3 or significant systolic murmur, no pericardial rub. Abdomen: Soft, nontender, dressed laparoscopy incisions with Steri-Strips in place, small area of ecchymosis right lower quadrant at Lovenox injection  site, bowel sounds present, no guarding or rebound.  Extremities: No pitting edema, distal pulses 2+. Skin: Warm and dry. Musculoskeletal: No kyphosis. Neuropsychiatric: Alert and oriented x3, affect grossly appropriate.  ECG: I personally reviewed the tracing from 07/17/2017 which showed sinus rhythm with leftward axis and low voltage, old anterior infarct pattern.  Recent Labwork: 10/19/2017: BUN 19; Creatinine, Ser 1.15; Hemoglobin 15.3; Platelets 156; Potassium 4.4; Sodium 138   Other Studies Reviewed Today:  Echocardiogram 01/09/2017: Study Conclusions  - Left ventricle: The cavity size was normal. Wall thickness was   increased in a pattern of mild LVH. Systolic function was   severely reduced. The estimated ejection fraction was in the   range of 25% to 30%. There is akinesis of the  mid-apicalanteroseptal, anterior, and apical myocardium. There is   akinesis of the apicalinferior myocardium. No definite formed LV   mural thrombus noted. There is significant low flow and swirling   noted at the apex with Definity contrast. Doppler parameters are   consistent with abnormal left ventricular relaxation (grade 1   diastolic dysfunction). - Aortic valve: Trileaflet; mildly calcified leaflets. - Mitral valve: There was trivial regurgitation. - Left atrium: The atrium was mildly to moderately dilated. - Right ventricle: Pacer wire or catheter noted in right ventricle. - Right atrium: The atrium was mildly dilated. - Tricuspid valve: There was trivial regurgitation. - Pulmonary arteries: PA peak pressure: 26 mm Hg (S). - Pericardium, extracardiac: There was no pericardial effusion.  Impressions:  - Mild LVH with LVEF 25-30%. Large region of mid to apical   anteroseptal, anterior, and inferior apical akinesis consistent   with ischemic cardiomyopathy. No definite formed LV mural   thrombus noted. There is significant low flow and swirling noted   at the apex with Definity  contrast. Grade 1 diastolic   dysfunction. Trivial mitral regurgitation. Mild to moderate left   atrial enlargement. Mildly sclerotic aortic valve. Device wire   noted within the right heart. Trivial tricuspid regurgitation   with PASP 26 mmHg.  Assessment and Plan:  1.  Ischemic cardiomyopathy with LVEF 25-30% and chronic systolic heart failure.  He has been clinically stable.  Plan is to continue current regimen, we have discussed potentially switching from Cozaar to [facility], he will consider this further.  2.  St. Jude ICD in place, no syncope or device discharges.  Keep follow-up with [name].  3.  Paroxysmal atrial fibrillation.  Currently on Lovenox bridge with reinstitution of Coumadin.  He is followed in the anticoagulation clinic.  He prefers to stay on Coumadin rather than switching to DOAC.  4.  Severe obstructive sleep apnea, on CPAP.  Current medicines were reviewed with the patient today.  Disposition: Follow-up in 3 months.   Signed, Satira Sark, MD, [facility] 10/24/2017 9:18 AM    [facility], [place], [place] [address] Phone: [phone]; Fax: [phone]

## 2017-10-22 NOTE — Anesthesia Preprocedure Evaluation (Addendum)
Anesthesia Evaluation  Patient identified by MRN, date of birth, ID band Patient awake    Reviewed: Allergy & Precautions, NPO status , Patient's Chart, lab work & pertinent test results  Airway Mallampati: II  TM Distance: >3 FB Neck ROM: Full    Dental  (+) Dental Advisory Given   Pulmonary sleep apnea ,    breath sounds clear to auscultation       Cardiovascular hypertension, Pt. on medications and Pt. on home beta blockers + CAD, + Past MI and + Cardiac Stents  + dysrhythmias + Cardiac Defibrillator  Rhythm:Regular Rate:Normal  Echo: Impressions: - Mild LVH with LVEF 25-30%. Large region of mid to apical anteroseptal, anterior, and inferior apical akinesis consistentwith ischemic cardiomyopathy. No definite formed LV muralthrombus noted. There is significant low flow and swirling notedat the apex with Definity contrast. Grade 1 diastolic dysfunction. Trivial mitral regurgitation. Mild to moderate left atrial enlargement. Mildly sclerotic aortic valve. Device wirenoted within the right heart. Trivial tricuspid regurgitation with PASP 26 mmHg.  Nuclear stress test 04/26/16: There was no ST segment deviation noted during stress. Findings consistent with large prior apical and anteroapical myocardial infarction. There is no significant peri-infarct ischemia. The left ventricular ejection fraction is moderately decreased (30-44%). This is an intermediate risk study. Intermediate risk based on decreased LVEF and prior infarct, no significant current myocardium at jeopardy.     Neuro/Psych CVA    GI/Hepatic Neg liver ROS, hiatal hernia,   Endo/Other  negative endocrine ROS  Renal/GU negative Renal ROS     Musculoskeletal   Abdominal   Peds  Hematology negative hematology ROS (+)   Anesthesia Other Findings   Reproductive/Obstetrics                            Lab Results  Component Value Date    WBC 7.9 10/19/2017   HGB 15.3 10/19/2017   HCT 46.3 10/19/2017   MCV 95.9 10/19/2017   PLT 156 10/19/2017   Lab Results  Component Value Date   CREATININE 1.15 10/19/2017   BUN 19 10/19/2017   NA 138 10/19/2017   K 4.4 10/19/2017   CL 104 10/19/2017   CO2 25 10/19/2017   Lab Results  Component Value Date   INR 2.14 10/19/2017   INR 3.2 10/17/2017   INR 2.7 09/05/2017    Anesthesia Physical Anesthesia Plan  ASA: III  Anesthesia Plan: General   Post-op Pain Management:    Induction: Intravenous  PONV Risk Score and Plan: 3 and Ondansetron, Dexamethasone, Treatment may vary due to age or medical condition and Midazolam  Airway Management Planned: Oral ETT  Additional Equipment:   Intra-op Plan:   Post-operative Plan: Extubation in OR  Informed Consent: I have reviewed the patients History and Physical, chart, labs and discussed the procedure including the risks, benefits and alternatives for the proposed anesthesia with the patient or authorized representative who has indicated his/her understanding and acceptance.   Dental advisory given  Plan Discussed with: CRNA  Anesthesia Plan Comments:        Anesthesia Quick Evaluation

## 2017-10-23 ENCOUNTER — Ambulatory Visit (HOSPITAL_COMMUNITY): Payer: BLUE CROSS/BLUE SHIELD | Admitting: Vascular Surgery

## 2017-10-23 ENCOUNTER — Ambulatory Visit (HOSPITAL_COMMUNITY)
Admission: RE | Admit: 2017-10-23 | Discharge: 2017-10-23 | Disposition: A | Discharge status: Home / Self Care | Payer: BLUE CROSS/BLUE SHIELD | Source: Ambulatory Visit | Attending: General Surgery | Admitting: General Surgery

## 2017-10-23 ENCOUNTER — Encounter (HOSPITAL_COMMUNITY): Payer: Self-pay | Admitting: Urology

## 2017-10-23 ENCOUNTER — Encounter (HOSPITAL_COMMUNITY)
Admission: RE | Disposition: A | Discharge status: Home / Self Care | Payer: Self-pay | Source: Ambulatory Visit | Attending: General Surgery

## 2017-10-23 DIAGNOSIS — N400 Enlarged prostate without lower urinary tract symptoms: Secondary | ICD-10-CM | POA: Diagnosis not present

## 2017-10-23 DIAGNOSIS — I110 Hypertensive heart disease with heart failure: Secondary | ICD-10-CM | POA: Diagnosis not present

## 2017-10-23 DIAGNOSIS — Z8673 Personal history of transient ischemic attack (TIA), and cerebral infarction without residual deficits: Secondary | ICD-10-CM | POA: Insufficient documentation

## 2017-10-23 DIAGNOSIS — I509 Heart failure, unspecified: Secondary | ICD-10-CM | POA: Diagnosis not present

## 2017-10-23 DIAGNOSIS — Z955 Presence of coronary angioplasty implant and graft: Secondary | ICD-10-CM | POA: Insufficient documentation

## 2017-10-23 DIAGNOSIS — I5022 Chronic systolic (congestive) heart failure: Secondary | ICD-10-CM | POA: Diagnosis not present

## 2017-10-23 DIAGNOSIS — Z7982 Long term (current) use of aspirin: Secondary | ICD-10-CM | POA: Diagnosis not present

## 2017-10-23 DIAGNOSIS — I252 Old myocardial infarction: Secondary | ICD-10-CM | POA: Insufficient documentation

## 2017-10-23 DIAGNOSIS — Z7901 Long term (current) use of anticoagulants: Secondary | ICD-10-CM | POA: Diagnosis not present

## 2017-10-23 DIAGNOSIS — G4730 Sleep apnea, unspecified: Secondary | ICD-10-CM | POA: Diagnosis not present

## 2017-10-23 DIAGNOSIS — I2510 Atherosclerotic heart disease of native coronary artery without angina pectoris: Secondary | ICD-10-CM | POA: Diagnosis not present

## 2017-10-23 DIAGNOSIS — I4891 Unspecified atrial fibrillation: Secondary | ICD-10-CM | POA: Diagnosis not present

## 2017-10-23 DIAGNOSIS — K436 Other and unspecified ventral hernia with obstruction, without gangrene: Secondary | ICD-10-CM | POA: Insufficient documentation

## 2017-10-23 DIAGNOSIS — Z79899 Other long term (current) drug therapy: Secondary | ICD-10-CM | POA: Insufficient documentation

## 2017-10-23 DIAGNOSIS — K439 Ventral hernia without obstruction or gangrene: Secondary | ICD-10-CM | POA: Diagnosis not present

## 2017-10-23 DIAGNOSIS — E7800 Pure hypercholesterolemia, unspecified: Secondary | ICD-10-CM | POA: Insufficient documentation

## 2017-10-23 DIAGNOSIS — Z95810 Presence of automatic (implantable) cardiac defibrillator: Secondary | ICD-10-CM | POA: Diagnosis not present

## 2017-10-23 DIAGNOSIS — E782 Mixed hyperlipidemia: Secondary | ICD-10-CM | POA: Diagnosis not present

## 2017-10-23 HISTORY — PX: VENTRAL HERNIA REPAIR: SHX424

## 2017-10-23 HISTORY — PX: LAPAROSCOPY: SHX197

## 2017-10-23 LAB — APTT: APTT: 31 s (ref 24–36)

## 2017-10-23 LAB — PROTIME-INR
INR: 1.12
Prothrombin Time: 14.3 seconds (ref 11.4–15.2)

## 2017-10-23 SURGERY — LAPAROSCOPY, DIAGNOSTIC
Anesthesia: General | Site: Abdomen

## 2017-10-23 MED ORDER — DEXAMETHASONE SODIUM PHOSPHATE 10 MG/ML IJ SOLN
INTRAMUSCULAR | Status: AC
Start: 2017-10-23 — End: ?
  Filled 2017-10-23: qty 1

## 2017-10-23 MED ORDER — SODIUM CHLORIDE 0.9 % IV SOLN: INTRAVENOUS | Status: DC

## 2017-10-23 MED ORDER — ONDANSETRON HCL 4 MG/2ML IJ SOLN: 4.0000 mg | Freq: Once | INTRAMUSCULAR | Status: DC | PRN

## 2017-10-23 MED ORDER — MIDAZOLAM HCL 2 MG/2ML IJ SOLN
INTRAMUSCULAR | Status: DC | PRN
  Administered 2017-10-23 (×2): 1 mg via INTRAVENOUS

## 2017-10-23 MED ORDER — CEFAZOLIN SODIUM-DEXTROSE 2-4 GM/100ML-% IV SOLN
2.0000 g | INTRAVENOUS | Status: AC
  Administered 2017-10-23: 2 g via INTRAVENOUS
  Filled 2017-10-23: qty 100

## 2017-10-23 MED ORDER — SUCCINYLCHOLINE CHLORIDE 20 MG/ML IJ SOLN
INTRAMUSCULAR | Status: DC | PRN
  Administered 2017-10-23: 120 mg via INTRAVENOUS

## 2017-10-23 MED ORDER — ONDANSETRON HCL 4 MG/2ML IJ SOLN
INTRAMUSCULAR | Status: DC | PRN
  Administered 2017-10-23: 4 mg via INTRAVENOUS

## 2017-10-23 MED ORDER — OXYCODONE HCL 5 MG PO TABS: 5.0000 mg | ORAL_TABLET | ORAL | Status: DC | PRN

## 2017-10-23 MED ORDER — ROCURONIUM BROMIDE 10 MG/ML (PF) SYRINGE
PREFILLED_SYRINGE | INTRAVENOUS | Status: DC | PRN
  Administered 2017-10-23: 30 mg via INTRAVENOUS
  Administered 2017-10-23: 20 mg via INTRAVENOUS

## 2017-10-23 MED ORDER — ACETAMINOPHEN 500 MG PO TABS
1000.0000 mg | ORAL_TABLET | ORAL | Status: AC
  Administered 2017-10-23: 1000 mg via ORAL
  Filled 2017-10-23: qty 2

## 2017-10-23 MED ORDER — ROCURONIUM BROMIDE 10 MG/ML (PF) SYRINGE
PREFILLED_SYRINGE | INTRAVENOUS | Status: AC
  Filled 2017-10-23: qty 5

## 2017-10-23 MED ORDER — GABAPENTIN 300 MG PO CAPS
300.0000 mg | ORAL_CAPSULE | ORAL | Status: AC
  Administered 2017-10-23: 300 mg via ORAL
  Filled 2017-10-23: qty 1

## 2017-10-23 MED ORDER — FENTANYL CITRATE (PF) 250 MCG/5ML IJ SOLN
INTRAMUSCULAR | Status: DC | PRN
  Administered 2017-10-23 (×2): 50 ug via INTRAVENOUS
  Administered 2017-10-23: 100 ug via INTRAVENOUS

## 2017-10-23 MED ORDER — MIDAZOLAM HCL 2 MG/2ML IJ SOLN
INTRAMUSCULAR | Status: AC
  Filled 2017-10-23: qty 2

## 2017-10-23 MED ORDER — OXYCODONE HCL 5 MG PO TABS
ORAL_TABLET | ORAL | Status: AC
  Filled 2017-10-23: qty 1

## 2017-10-23 MED ORDER — SODIUM CHLORIDE 0.9 % IV SOLN: 250.0000 mL | INTRAVENOUS | Status: DC | PRN

## 2017-10-23 MED ORDER — PHENYLEPHRINE 40 MCG/ML (10ML) SYRINGE FOR IV PUSH (FOR BLOOD PRESSURE SUPPORT)
PREFILLED_SYRINGE | INTRAVENOUS | Status: AC
  Filled 2017-10-23: qty 10

## 2017-10-23 MED ORDER — ACETAMINOPHEN 325 MG PO TABS
650.0000 mg | ORAL_TABLET | ORAL | Status: DC | PRN
Start: 2017-10-23 — End: 2017-10-23

## 2017-10-23 MED ORDER — LACTATED RINGERS IV SOLN
INTRAVENOUS | Status: DC | PRN
  Administered 2017-10-23: 07:00:00 via INTRAVENOUS

## 2017-10-23 MED ORDER — EPHEDRINE SULFATE-NACL 50-0.9 MG/10ML-% IV SOSY
PREFILLED_SYRINGE | INTRAVENOUS | Status: DC | PRN
  Administered 2017-10-23 (×3): 5 mg via INTRAVENOUS

## 2017-10-23 MED ORDER — 0.9 % SODIUM CHLORIDE (POUR BTL) OPTIME
TOPICAL | Status: DC | PRN
  Administered 2017-10-23: 1000 mL

## 2017-10-23 MED ORDER — PROPOFOL 10 MG/ML IV BOLUS
INTRAVENOUS | Status: AC
  Filled 2017-10-23: qty 20

## 2017-10-23 MED ORDER — BUPIVACAINE-EPINEPHRINE (PF) 0.5% -1:200000 IJ SOLN
INTRAMUSCULAR | Status: AC
Start: 2017-10-23 — End: ?
  Filled 2017-10-23: qty 30

## 2017-10-23 MED ORDER — LIDOCAINE 2% (20 MG/ML) 5 ML SYRINGE
INTRAMUSCULAR | Status: DC | PRN
  Administered 2017-10-23: 80 mg via INTRAVENOUS

## 2017-10-23 MED ORDER — BUPIVACAINE-EPINEPHRINE (PF) 0.5% -1:200000 IJ SOLN
INTRAMUSCULAR | Status: DC | PRN
Start: 2017-10-23 — End: 2017-10-23
  Administered 2017-10-23: 5 mL

## 2017-10-23 MED ORDER — SUGAMMADEX SODIUM 200 MG/2ML IV SOLN
INTRAVENOUS | Status: DC | PRN
  Administered 2017-10-23: 200 mg via INTRAVENOUS

## 2017-10-23 MED ORDER — ETOMIDATE 2 MG/ML IV SOLN
INTRAVENOUS | Status: DC | PRN
  Administered 2017-10-23: 16 mg via INTRAVENOUS

## 2017-10-23 MED ORDER — ACETAMINOPHEN 650 MG RE SUPP: 650.0000 mg | RECTAL | Status: DC | PRN

## 2017-10-23 MED ORDER — STERILE WATER FOR IRRIGATION IR SOLN
Status: DC | PRN
  Administered 2017-10-23: 1000 mL

## 2017-10-23 MED ORDER — FENTANYL CITRATE (PF) 100 MCG/2ML IJ SOLN: 25.0000 ug | INTRAMUSCULAR | Status: DC | PRN

## 2017-10-23 MED ORDER — DEXAMETHASONE SODIUM PHOSPHATE 10 MG/ML IJ SOLN
INTRAMUSCULAR | Status: DC | PRN
  Administered 2017-10-23: 10 mg via INTRAVENOUS

## 2017-10-23 MED ORDER — SODIUM CHLORIDE 0.9% FLUSH: 3.0000 mL | Freq: Two times a day (BID) | INTRAVENOUS | Status: DC

## 2017-10-23 MED ORDER — FENTANYL CITRATE (PF) 250 MCG/5ML IJ SOLN
INTRAMUSCULAR | Status: AC
  Filled 2017-10-23: qty 5

## 2017-10-23 MED ORDER — LIDOCAINE 2% (20 MG/ML) 5 ML SYRINGE
INTRAMUSCULAR | Status: AC
  Filled 2017-10-23: qty 5

## 2017-10-23 MED ORDER — MORPHINE SULFATE (PF) 2 MG/ML IV SOLN
2.0000 mg | INTRAVENOUS | Status: DC | PRN
Start: 2017-10-23 — End: 2017-10-23

## 2017-10-23 MED ORDER — OXYCODONE HCL 5 MG PO TABS
5.0000 mg | ORAL_TABLET | Freq: Once | ORAL | Status: AC
Start: 2017-10-23 — End: 2017-10-23
  Administered 2017-10-23: 5 mg via ORAL

## 2017-10-23 MED ORDER — SODIUM CHLORIDE 0.9% FLUSH: 3.0000 mL | INTRAVENOUS | Status: DC | PRN

## 2017-10-23 MED ORDER — OXYCODONE HCL 5 MG PO TABS: 5.0000 mg | ORAL_TABLET | Freq: Four times a day (QID) | ORAL | 0 refills | Status: DC | PRN

## 2017-10-23 SURGICAL SUPPLY — 42 items
BLADE CLIPPER SURG (BLADE) ×3 IMPLANT
CANISTER SUCT 3000ML PPV (MISCELLANEOUS) ×3 IMPLANT
CHLORAPREP W/TINT 26ML (MISCELLANEOUS) ×3 IMPLANT
CLOSURE WOUND 1/2 X4 (GAUZE/BANDAGES/DRESSINGS) ×1
COVER SURGICAL LIGHT HANDLE (MISCELLANEOUS) ×3 IMPLANT
DERMABOND ADVANCED (GAUZE/BANDAGES/DRESSINGS) ×2
DERMABOND ADVANCED .7 DNX12 (GAUZE/BANDAGES/DRESSINGS) ×1 IMPLANT
ELECT CAUTERY BLADE 6.4 (BLADE) ×3 IMPLANT
ELECT REM PT RETURN 9FT ADLT (ELECTROSURGICAL) ×3
ELECTRODE REM PT RTRN 9FT ADLT (ELECTROSURGICAL) ×1 IMPLANT
GLOVE BIO SURGEON STRL SZ7 (GLOVE) ×6 IMPLANT
GLOVE BIOGEL PI IND STRL 7.0 (GLOVE) ×2 IMPLANT
GLOVE BIOGEL PI IND STRL 7.5 (GLOVE) ×1 IMPLANT
GLOVE BIOGEL PI INDICATOR 7.0 (GLOVE) ×4
GLOVE BIOGEL PI INDICATOR 7.5 (GLOVE) ×2
GLOVE SURG SS PI 6.5 STRL IVOR (GLOVE) ×6 IMPLANT
GLOVE SURG SS PI 7.0 STRL IVOR (GLOVE) ×3 IMPLANT
GOWN STRL REUS W/ TWL LRG LVL3 (GOWN DISPOSABLE) ×2 IMPLANT
GOWN STRL REUS W/ TWL XL LVL3 (GOWN DISPOSABLE) ×1 IMPLANT
GOWN STRL REUS W/TWL LRG LVL3 (GOWN DISPOSABLE) ×4
GOWN STRL REUS W/TWL XL LVL3 (GOWN DISPOSABLE) ×2
KIT BASIN OR (CUSTOM PROCEDURE TRAY) ×3 IMPLANT
KIT TURNOVER KIT B (KITS) ×3 IMPLANT
MARKER SKIN DUAL TIP RULER LAB (MISCELLANEOUS) ×3 IMPLANT
NEEDLE SPNL 22GX3.5 QUINCKE BK (NEEDLE) ×3 IMPLANT
NS IRRIG 1000ML POUR BTL (IV SOLUTION) ×3 IMPLANT
PAD ARMBOARD 7.5X6 YLW CONV (MISCELLANEOUS) ×6 IMPLANT
PENCIL BUTTON HOLSTER BLD 10FT (ELECTRODE) ×3 IMPLANT
SLEEVE ENDOPATH XCEL 5M (ENDOMECHANICALS) ×6 IMPLANT
STRIP CLOSURE SKIN 1/2X4 (GAUZE/BANDAGES/DRESSINGS) ×2 IMPLANT
SUT ETHIBOND NAB CT1 #1 30IN (SUTURE) ×9 IMPLANT
SUT MNCRL AB 4-0 PS2 18 (SUTURE) ×3 IMPLANT
SUT NOV 1 T60/GS (SUTURE) IMPLANT
SUT NOVA NAB GS-21 0 18 T12 DT (SUTURE) IMPLANT
SUT VIC AB 3-0 SH 27 (SUTURE) ×2
SUT VIC AB 3-0 SH 27X BRD (SUTURE) ×1 IMPLANT
TOWEL GREEN STERILE (TOWEL DISPOSABLE) ×3 IMPLANT
TRAY FOLEY W/METER SILVER 14FR (SET/KITS/TRAYS/PACK) ×3 IMPLANT
TRAY LAPAROSCOPIC MC (CUSTOM PROCEDURE TRAY) ×3 IMPLANT
TROCAR XCEL NON-BLD 5MMX100MML (ENDOMECHANICALS) ×3 IMPLANT
TUBING INSUFFLATION (TUBING) ×6 IMPLANT
WATER STERILE IRR 1000ML POUR (IV SOLUTION) ×3 IMPLANT

## 2017-10-23 NOTE — Anesthesia Procedure Notes (Signed)
Procedure Name: Intubation Date/Time: 10/23/2017 7:56 AM Performed by: Julieta Bellini, CRNA Pre-anesthesia Checklist: Patient identified, Emergency Drugs available, Suction available, Patient being monitored and Timeout performed Patient Re-evaluated:Patient Re-evaluated prior to induction Oxygen Delivery Method: Circle system utilized Preoxygenation: Pre-oxygenation with 100% oxygen Induction Type: IV induction Laryngoscope Size: Miller Grade View: Grade I Tube type: Oral Tube size: 7.0 mm Number of attempts: 1 Airway Equipment and Method: Patient positioned with wedge pillow and Stylet Placement Confirmation: ETT inserted through vocal cords under direct vision,  positive ETCO2 and breath sounds checked- equal and bilateral Secured at: 23 cm Tube secured with: Tape Dental Injury: Teeth and Oropharynx as per pre-operative assessment

## 2017-10-23 NOTE — Transfer of Care (Signed)
Immediate Anesthesia Transfer of Care Note  Patient: [name]  Procedure(s) Performed: LAPAROSCOPY DIAGNOSTIC (N/A Abdomen) VENTRAL HERNIA REPAIR (N/A Abdomen)  Patient Location: PACU  Anesthesia Type:General  Level of Consciousness: awake, alert , oriented and patient cooperative  Airway & Oxygen Therapy: Patient Spontanous Breathing and Patient connected to nasal cannula oxygen  Post-op Assessment: Report given to RN, Post -op Vital signs reviewed and stable and Patient moving all extremities X 4  Post vital signs: Reviewed and stable  Last Vitals:  Vitals:   10/23/17 0628  BP: (!) 96/57  Pulse: (!) 54  Resp: 18  Temp: 37.1 C  SpO2: 98%    Last Pain:  Vitals:   10/23/17 0628  TempSrc: Oral         Complications: No apparent anesthesia complications

## 2017-10-23 NOTE — Anesthesia Postprocedure Evaluation (Signed)
Anesthesia Post Note  Patient: [name]  Procedure(s) Performed: LAPAROSCOPY DIAGNOSTIC (N/A Abdomen) VENTRAL HERNIA REPAIR (N/A Abdomen)     Patient location during evaluation: PACU Anesthesia Type: General Level of consciousness: awake and alert Pain management: pain level controlled Vital Signs Assessment: post-procedure vital signs reviewed and stable Respiratory status: spontaneous breathing, nonlabored ventilation, respiratory function stable and patient connected to nasal cannula oxygen Cardiovascular status: blood pressure returned to baseline and stable Postop Assessment: no apparent nausea or vomiting Anesthetic complications: no    Last Vitals:  Vitals:   10/23/17 0925 10/23/17 0941  BP:  (!) 133/96  Pulse: 66 (!) 59  Resp: (!) 28   Temp: (!) 36.3 C   SpO2: 94% 94%    Last Pain:  Vitals:   10/23/17 0920  TempSrc:   PainSc: 6                  Tiajuana Amass

## 2017-10-23 NOTE — Discharge Instructions (Signed)
[facility] LAPAROSCOPIC SURGERY: POST OP INSTRUCTIONS  Always review your discharge instruction sheet given to you by the facility where your surgery was performed. IF YOU HAVE DISABILITY OR FAMILY LEAVE FORMS, YOU MUST BRING THEM TO THE OFFICE FOR PROCESSING.   DO NOT GIVE THEM TO YOUR DOCTOR.  1. A prescription for pain medication may be given to you upon discharge.  Take your pain medication as prescribed, if needed.  If narcotic pain medicine is not needed, then you may take acetaminophen (Tylenol), naprosyn (Alleve), or ibuprofen (Advil) as needed. 2. Take your usually prescribed medications unless otherwise directed. 3. If you need a refill on your pain medication, please contact your pharmacy.  They will contact our office to request authorization. Prescriptions will not be filled after 5pm or on week-ends. 4. You should follow a light diet the first few days after arrival home, such as soup and crackers, etc.  Be sure to include lots of fluids daily. 5. Most patients will experience some swelling and bruising in the area of the incisions.  Ice packs will help.  Swelling and bruising can take several days to resolve.  6. It is common to experience some constipation if taking pain medication after surgery.  Increasing fluid intake and taking a stool softener (such as Colace) will usually help or prevent this problem from occurring.  A mild laxative (Milk of Magnesia or Miralax) should be taken according to package instructions if there are no bowel movements after 48 hours. 7. Unless discharge instructions indicate otherwise, you may remove your bandages 48 hours after surgery, and you may shower at that time.  You may have steri-strips (small skin tapes) in place directly over the incision.  These strips should be left on the skin for 7-10 days.  If your surgeon used skin glue on the incision, you may shower in 24 hours.  The glue will flake  off over the next 2-3 weeks.  Any sutures or staples will be removed at the office during your follow-up visit. 8. ACTIVITIES:  You may resume regular (light) daily activities beginning the next day--such as daily self-care, walking, climbing stairs--gradually increasing activities as tolerated.  You may have sexual intercourse when it is comfortable.  Refrain from any heavy lifting or straining until approved by your doctor. a. You may drive when you are no longer taking prescription pain medication, you can comfortably wear a seatbelt, and you can safely maneuver your car and apply brakes. b. RETURN TO WORK:  __________________________________________________________ 9. You should see your doctor in the office for a follow-up appointment approximately 2-3 weeks after your surgery.  Make sure that you call for this appointment within a day or two after you arrive home to insure a convenient appointment time. 10. OTHER INSTRUCTIONS: __________________________________________________________________________________________________________________________ __________________________________________________________________________________________________________________________ WHEN TO CALL YOUR DOCTOR: 1. Fever over 101.0 2. Inability to urinate 3. Continued bleeding from incision. 4. Increased pain, redness, or drainage from the incision. 5. Increasing abdominal pain  The clinic staff is available to answer your questions during regular business hours.  Please don't hesitate to call and ask to speak to one of the nurses for clinical concerns.  If you have a medical emergency, go to the nearest emergency room or call 911.  A surgeon from [facility] is always on call at the hospital. [address], [address], [place], [place]  [address] ? [address] [address], [place], [place]   [address] [phone] ? [phone] ? [phone] Web site: [email or web address]  

## 2017-10-23 NOTE — Op Note (Signed)
Preoperative diagnosis: Ventral hernia Postoperative diagnosis: Same as above Procedure: Laparoscopic-assisted primary repair of ventral hernia Surgeon: Dr. Serita Grammes Anesthesia: General Estimated blood loss: Minimal Complications: None Drains: None Specimens: None Sponge needle count was correct at completion Disposition to recovery in stable condition  Indications: This is a 67 year old male who is maintained on Coumadin for his atrial fibrillation who presents with a symptomatic supraumbilical hernia.  This was found on a CT scan.  It is a very small hernia but has become increasingly symptomatic as he is active.  I discussed a diagnostic laparoscopy and either a primary or mesh repair of this hernia depending on the size.  Procedure: After informed consent was obtained the patient was taken to the operating room.  He was placed under general anesthesia.  He had been given cefazolin.  SCDs were in place.  A Foley catheter was placed.  He was then prepped and draped in the standard sterile surgical fashion.  A surgical timeout was then performed.  I infiltrated Marcaine in the left upper quadrant.  His stomach was evacuated with an orogastric tube.  I then made a 5 mm incision.  I entered into the peritoneum with a 5 mm Optiview trocar without injury.  I then insufflated the abdomen to 15 mmHg pressure.  I inserted 2 additional 5 mm trocars in the left abdomen under direct vision without complication.  I then noted that his falciform ligament came down almost to his umbilicus.  I took this down with cautery and was able to identify a very small hernia in the supraumbilical position.  I reduced a fair amount of this intraperitoneally.  I then elected to make a transverse incision overlying this anteriorly.  I entered into the subcutaneous space and he was noted to have a fair amount of preperitoneal fat incarcerated in this hernia I excised this and identified about a 5 mm hernia I then elected  just to fix this with #1 Ethibond sutures.  This was done without tension.  I then looked back in with the laparoscope.  This all appeared to be completely obliterated at that point.  I then removed the laparoscopic instruments and desufflated the abdomen.  I then closed the laparoscopic incisions with 4-0 Monocryl.  The transverse incision where I fixed the hernia was closed with 3-0 Vicryl and 4-0 Monocryl.  Glue were placed on all the wounds.  He tolerated this well was extubated and transferred to recovery in stable condition.

## 2017-10-23 NOTE — H&P (Signed)
   67 yom referred by Dr Rory Percy for a ventral hernia. he noted a bulge above his umbilicus some months ago. he has no prior abdominal surgery. normal bms. no n/v. has a ct scan that shows a small supraumbilical hernia that has fat in it. he is here today to discuss options.   Diagnostic Studies History  Colonoscopy  5-10 years ago  Allergies  No Known Drug Allergies   Medication History  Bisoprolol Fumarate (5MG  Tablet, Oral) Active. Spironolactone (25MG  Tablet, Oral) Active. Warfarin Sodium (5MG  Tablet, Oral) Active. Rosuvastatin Calcium (20MG  Tablet, Oral) Active. Losartan Potassium (50MG  Tablet, Oral) Active. Finasteride (5MG  Tablet, Oral) Active. Baby Aspirin (81MG  Tablet Chewable, Oral) Active. Omega 3 (1000MG  Capsule, Oral) Active. Medications Reconciled  Social History  Alcohol use  Moderate alcohol use. Caffeine use  Tea. No drug use  Tobacco use  Never smoker.  Family History  Cancer  Mother. Heart Disease  Father. Heart disease in male family member before age 23  Hypertension  Father.  Other Problems  Atrial Fibrillation  Cerebrovascular Accident  Congestive Heart Failure  Enlarged Prostate  High blood pressure  Hypercholesterolemia  Inguinal Hernia  Myocardial infarction  Sleep Apnea    Review of Systems  General Not Present- Appetite Loss, Chills, Fatigue, Fever, Night Sweats, Weight Gain and Weight Loss. Skin Not Present- Change in Wart/Mole, Dryness, Hives, Jaundice, New Lesions, Non-Healing Wounds, Rash and Ulcer. HEENT Present- Wears glasses/contact lenses. Not Present- Earache, Hearing Loss, Hoarseness, Nose Bleed, Oral Ulcers, Ringing in the Ears, Seasonal Allergies, Sinus Pain, Sore Throat, Visual Disturbances and Yellow Eyes. Respiratory Present- Snoring. Not Present- Bloody sputum, Chronic Cough, Difficulty Breathing and Wheezing. Cardiovascular Present- Leg Cramps. Not Present- Chest Pain, Difficulty  Breathing Lying Down, Palpitations, Rapid Heart Rate, Shortness of Breath and Swelling of Extremities. Gastrointestinal Not Present- Abdominal Pain, Bloating, Bloody Stool, Change in Bowel Habits, Chronic diarrhea, Constipation, Difficulty Swallowing, Excessive gas, Gets full quickly at meals, Hemorrhoids, Indigestion, Nausea, Rectal Pain and Vomiting. Male Genitourinary Present- Impotence and Nocturia. Not Present- Blood in Urine, Change in Urinary Stream, Frequency, Painful Urination, Urgency and Urine Leakage.  Vitals Weight: 240 lb Height: 73in Body Surface Area: 2.33 m Body Mass Index: 31.66 kg/m  Pulse: 56 (Regular)  BP: 110/64 (Sitting, Left Arm, Standard) Physical Exam  General Mental Status-Alert. Orientation-Oriented X3. Head and Neck Trachea-midline. Thyroid Gland Characteristics - normal size and consistency. Eye Sclera/Conjunctiva - Bilateral-No scleral icterus. Chest and Lung Exam Chest and lung exam reveals -quiet, even and easy respiratory effort with no use of accessory muscles and on auscultation, normal breath sounds, no adventitious sounds and normal vocal resonance. Cardiovascular Cardiovascular examination reveals -normal heart sounds, regular rate and rhythm with no murmurs. Abdomen Note: soft nontender diastasis recti, small epigastric hernia   Assessment & Plan  VENTRAL HERNIA (K43.9) Story: diagnostic laparoscopy, primary vs mesh ventral hernia repair I discussed locating this with the scope as it is hard to identify. I will then cut down and either fix primarily or repair with mesh likely anteriorly. risks including bleeding, infection, recurrence, injury to surrounding structures. will get cards recs and then schedule.

## 2017-10-23 NOTE — Interval H&P Note (Signed)
History and Physical Interval Note:  10/23/2017 7:17 AM  [name]  has presented today for surgery, with the diagnosis of VENTRAL HERNIA  The various methods of treatment have been discussed with the patient and family. After consideration of risks, benefits and other options for treatment, the patient has consented to  Procedure(s): LAPAROSCOPY DIAGNOSTIC (N/A) VENTRAL HERNIA REPAIR POSSIBLE MESH (N/A) as a surgical intervention .  The patient's history has been reviewed, patient examined, no change in status, stable for surgery.  I have reviewed the patient's chart and labs.  Questions were answered to the patient's satisfaction.     [name]

## 2017-10-24 ENCOUNTER — Encounter (HOSPITAL_COMMUNITY): Payer: Self-pay | Admitting: General Surgery

## 2017-10-24 ENCOUNTER — Ambulatory Visit: Payer: BLUE CROSS/BLUE SHIELD | Admitting: Cardiology

## 2017-10-24 VITALS — BP 115/70 | HR 68 | Ht 73.0 in | Wt 241.0 lb

## 2017-10-24 DIAGNOSIS — I480 Paroxysmal atrial fibrillation: Secondary | ICD-10-CM

## 2017-10-24 DIAGNOSIS — Z95810 Presence of automatic (implantable) cardiac defibrillator: Secondary | ICD-10-CM | POA: Diagnosis not present

## 2017-10-24 DIAGNOSIS — G4733 Obstructive sleep apnea (adult) (pediatric): Secondary | ICD-10-CM

## 2017-10-24 DIAGNOSIS — I255 Ischemic cardiomyopathy: Secondary | ICD-10-CM | POA: Diagnosis not present

## 2017-10-24 NOTE — Patient Instructions (Signed)
Medication Instructions:  Your physician recommends that you continue on your current medications as directed. Please refer to the Current Medication list given to you today.  Labwork: NONE  Testing/Procedures: NONE  Follow-Up: Your physician recommends that you schedule a follow-up appointment in: 3 MONTHS WITH DR. MCDOWELL.  Any Other Special Instructions Will Be Listed Below (If Applicable).  If you need a refill on your cardiac medications before your next appointment, please call your pharmacy. 

## 2017-10-29 ENCOUNTER — Other Ambulatory Visit: Payer: Self-pay | Admitting: Cardiology

## 2017-10-30 ENCOUNTER — Ambulatory Visit (INDEPENDENT_AMBULATORY_CARE_PROVIDER_SITE_OTHER): Payer: BLUE CROSS/BLUE SHIELD | Admitting: *Deleted

## 2017-10-30 DIAGNOSIS — Z5181 Encounter for therapeutic drug level monitoring: Secondary | ICD-10-CM | POA: Diagnosis not present

## 2017-10-30 DIAGNOSIS — I6350 Cerebral infarction due to unspecified occlusion or stenosis of unspecified cerebral artery: Secondary | ICD-10-CM

## 2017-10-30 DIAGNOSIS — Z7901 Long term (current) use of anticoagulants: Secondary | ICD-10-CM | POA: Diagnosis not present

## 2017-10-30 DIAGNOSIS — I4891 Unspecified atrial fibrillation: Secondary | ICD-10-CM | POA: Diagnosis not present

## 2017-10-30 LAB — POCT INR: INR: 1.6

## 2017-10-30 NOTE — Patient Instructions (Signed)
Take coumadin 2 tablets tonight, 1 1/2 tablets tomorrow night and recheck INR on Wednesdays Continue Lovenox 150mg  sq daily x 3 days (Mon,Tues,Wed) 3 syringes called in to [facility] [place]

## 2017-11-01 ENCOUNTER — Ambulatory Visit (INDEPENDENT_AMBULATORY_CARE_PROVIDER_SITE_OTHER): Payer: BLUE CROSS/BLUE SHIELD | Admitting: *Deleted

## 2017-11-01 DIAGNOSIS — Z7901 Long term (current) use of anticoagulants: Secondary | ICD-10-CM

## 2017-11-01 DIAGNOSIS — Z5181 Encounter for therapeutic drug level monitoring: Secondary | ICD-10-CM | POA: Diagnosis not present

## 2017-11-01 DIAGNOSIS — I6350 Cerebral infarction due to unspecified occlusion or stenosis of unspecified cerebral artery: Secondary | ICD-10-CM | POA: Diagnosis not present

## 2017-11-01 DIAGNOSIS — I4891 Unspecified atrial fibrillation: Secondary | ICD-10-CM | POA: Diagnosis not present

## 2017-11-01 LAB — POCT INR: INR: 2.6

## 2017-11-01 NOTE — Patient Instructions (Signed)
Restart coumadin 1 tablet daily except 1/2 tablet on Mondays and Fridays Stop Lovenox Recheck in 3 weeks

## 2017-11-08 DIAGNOSIS — G4731 Primary central sleep apnea: Secondary | ICD-10-CM | POA: Diagnosis not present

## 2017-11-10 ENCOUNTER — Other Ambulatory Visit: Payer: Self-pay | Admitting: Cardiology

## 2017-11-10 MED ORDER — BISOPROLOL FUMARATE 5 MG PO TABS: ORAL_TABLET | ORAL | 1 refills | Status: DC

## 2017-11-10 NOTE — Telephone Encounter (Signed)
Medication sent to pharmacy  

## 2017-11-10 NOTE — Telephone Encounter (Signed)
° ° ° °  1. Which medications need to be refilled? (please list name of each medication and dose if known) bisoprolol (ZEBETA) 5 MG tablet  2. Which pharmacy/location (including street and city if local pharmacy) is medication to be sent to?   [facility]  [place] [place]  3. Do they need a 30 day or 90 day supply? 90  Patient walked in stating that he needs to [facility]  [place] [place]    (not [place])

## 2017-11-13 ENCOUNTER — Other Ambulatory Visit: Payer: Self-pay | Admitting: *Deleted

## 2017-11-13 MED ORDER — BISOPROLOL FUMARATE 5 MG PO TABS: ORAL_TABLET | ORAL | 0 refills | Status: DC

## 2017-11-14 ENCOUNTER — Other Ambulatory Visit: Payer: Self-pay | Admitting: *Deleted

## 2017-11-14 MED ORDER — BISOPROLOL FUMARATE 5 MG PO TABS: ORAL_TABLET | ORAL | 3 refills | Status: DC

## 2017-11-27 ENCOUNTER — Ambulatory Visit (INDEPENDENT_AMBULATORY_CARE_PROVIDER_SITE_OTHER): Payer: BLUE CROSS/BLUE SHIELD | Admitting: *Deleted

## 2017-11-27 DIAGNOSIS — Z7901 Long term (current) use of anticoagulants: Secondary | ICD-10-CM

## 2017-11-27 DIAGNOSIS — I6350 Cerebral infarction due to unspecified occlusion or stenosis of unspecified cerebral artery: Secondary | ICD-10-CM | POA: Diagnosis not present

## 2017-11-27 DIAGNOSIS — Z5181 Encounter for therapeutic drug level monitoring: Secondary | ICD-10-CM | POA: Diagnosis not present

## 2017-11-27 DIAGNOSIS — I4891 Unspecified atrial fibrillation: Secondary | ICD-10-CM

## 2017-11-27 LAB — POCT INR: INR: 2.7

## 2017-11-27 NOTE — Patient Instructions (Signed)
Continue coumadin 1 tablet daily except 1/2 tablet on Mondays and Fridays Recheck in 4 weeks  

## 2017-12-06 DIAGNOSIS — G4731 Primary central sleep apnea: Secondary | ICD-10-CM | POA: Diagnosis not present

## 2017-12-11 DIAGNOSIS — Z0001 Encounter for general adult medical examination with abnormal findings: Secondary | ICD-10-CM | POA: Diagnosis not present

## 2017-12-15 DIAGNOSIS — Z23 Encounter for immunization: Secondary | ICD-10-CM | POA: Diagnosis not present

## 2017-12-15 DIAGNOSIS — Z6832 Body mass index (BMI) 32.0-32.9, adult: Secondary | ICD-10-CM | POA: Diagnosis not present

## 2017-12-15 DIAGNOSIS — Z0001 Encounter for general adult medical examination with abnormal findings: Secondary | ICD-10-CM | POA: Diagnosis not present

## 2017-12-25 ENCOUNTER — Ambulatory Visit (INDEPENDENT_AMBULATORY_CARE_PROVIDER_SITE_OTHER): Payer: BLUE CROSS/BLUE SHIELD | Admitting: *Deleted

## 2017-12-25 DIAGNOSIS — I255 Ischemic cardiomyopathy: Secondary | ICD-10-CM

## 2017-12-25 DIAGNOSIS — Z5181 Encounter for therapeutic drug level monitoring: Secondary | ICD-10-CM

## 2017-12-25 DIAGNOSIS — I4891 Unspecified atrial fibrillation: Secondary | ICD-10-CM | POA: Diagnosis not present

## 2017-12-25 LAB — POCT INR: INR: 2.5

## 2017-12-25 NOTE — Patient Instructions (Signed)
Continue coumadin 1 tablet daily except 1/2 tablet on Mondays and Fridays Recheck in 6 weeks 

## 2017-12-26 NOTE — Progress Notes (Signed)
Remote ICD transmission.   

## 2017-12-28 ENCOUNTER — Encounter: Payer: Self-pay | Admitting: Cardiology

## 2018-01-06 DIAGNOSIS — G4731 Primary central sleep apnea: Secondary | ICD-10-CM | POA: Diagnosis not present

## 2018-01-17 LAB — CUP PACEART REMOTE DEVICE CHECK
Date Time Interrogation Session: 20190408084732
HighPow Impedance: 45 Ohm
HighPow Impedance: 45 Ohm
Lead Channel Impedance Value: 590 Ohm
Lead Channel Pacing Threshold Pulse Width: 0.5 ms
Lead Channel Setting Pacing Pulse Width: 0.5 ms
Lead Channel Setting Sensing Sensitivity: 0.5 mV
MDC IDC MSMT BATTERY REMAINING LONGEVITY: 96 mo
MDC IDC MSMT BATTERY REMAINING PERCENTAGE: 92 %
MDC IDC MSMT BATTERY VOLTAGE: 3.14 V
MDC IDC MSMT LEADCHNL RV PACING THRESHOLD AMPLITUDE: 0.75 V
MDC IDC MSMT LEADCHNL RV SENSING INTR AMPL: 12 mV
MDC IDC PG IMPLANT DT: 20180503
MDC IDC PG SERIAL: 7421148
MDC IDC SET LEADCHNL RV PACING AMPLITUDE: 2.5 V
MDC IDC STAT BRADY RV PERCENT PACED: 1.9 %

## 2018-01-22 NOTE — Progress Notes (Signed)
Cardiology Office Note  Date: 01/23/2018   ID: [name], DOB [birth year], MRN [id number]  PCP: Rory Percy, MD  Primary Cardiologist: Rozann Lesches, MD   Chief Complaint  Patient presents with  . Cardiomyopathy    History of Present Illness: [name] is a 67 y.o. male last seen in February.  He presents for a routine follow-up visit.  States that he has recuperated from laparoscopic ventral hernia repair.  Back to baseline in terms of activities, no exertional chest pain or palpitations, no syncope, stable NYHA class II dyspnea.  He is on Coumadin with follow-up in the anticoagulation clinic.  Last INR was 2.5.  He has had no spontaneous bleeding problems.  He states that he will likely be undergoing periodontal surgery sometime in the next several weeks, is seeing Dr. Andree Elk in [place].  He will have to be off Coumadin with Lovenox bridge for this.  Plan is for sedation in the office.  We went over his medications which are stable from a cardiac perspective.  We also discussed switching from Cozaar to [facility], he is in agreement to give this a try.  Blood pressure will likely limit much up titration however.  Reviewed his recent lab work per Dr. Nadara Mustard.  Glucose is in a prediabetic range.  He is going to try to work on diet but may have to start oral medications when he sees Dr. Nadara Mustard later.  He continues to follow in the device clinic with [name], [place] ICD in place.  Past Medical History:  Diagnosis Date  . A-V fistula ([place])   . AICD (automatic cardioverter/defibrillator) present 2006   ICD  . Anterior myocardial infarction ([place])   . Chronic systolic heart failure ([place])   . Coronary atherosclerosis of native coronary artery    a. s/p BMS to LAD and OM1 b. patent stents by cath in 2013  . History of hiatal hernia   . Hypertension   . 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 Ischemic cardiomyopathy    LVEF 30%, status post ICD  . Paroxysmal  atrial fibrillation ([facility])    On coumadin  . Pseudoaneurysm ([place])   . Sleep apnea    cpap  . Stroke [facility])     Past Surgical History:  Procedure Laterality Date  . AICD implantation  2011   St Jude ICD implanted for primary prevention of sudden death  . CATARACT EXTRACTION W/PHACO Left 03/27/2017   Procedure: CATARACT EXTRACTION PHACO AND INTRAOCULAR LENS PLACEMENT (IOC);  Surgeon: [name], MD;  Location: [facility];  Service: Ophthalmology;  Laterality: Left;  CDE: 8.48  . CATARACT EXTRACTION W/PHACO Right 05/29/2017   Procedure: CATARACT EXTRACTION PHACO AND INTRAOCULAR LENS PLACEMENT (IOC);  Surgeon: [name], MD;  Location: [facility];  Service: Ophthalmology;  Laterality: Right;  CDE: 7.48  . EYE SURGERY    . ICD GENERATOR CHANGEOUT N/A 01/19/2017   [facility] [place] VR ICD generator change by Dr [name]  . LAPAROSCOPY N/A 10/23/2017   Procedure: LAPAROSCOPY DIAGNOSTIC;  Surgeon: [name], MD;  Location: [facility];  Service: General;  Laterality: N/A;  . VENTRAL HERNIA REPAIR N/A 10/23/2017   Procedure: [place];  Surgeon: [name], MD;  Location: [place];  Service: General;  Laterality: N/A;    Current Outpatient Medications  Medication Sig Dispense Refill  . aspirin 81 MG tablet Take 81 mg by mouth daily.      . bisoprolol (ZEBETA) 5 MG tablet TAKE 1  TABLET (5 MG TOTAL) BY MOUTH DAILY. 90 tablet 3  . calcium carbonate (TUMS - DOSED IN MG ELEMENTAL CALCIUM) 500 MG chewable tablet Chew 1-2 tablets by mouth daily as needed for indigestion or heartburn.    . finasteride (PROSCAR) 5 MG tablet Take 5 mg by mouth Daily.     . nitroGLYCERIN (NITROSTAT) 0.4 MG SL tablet PLACE 1 TABLET (0.4 MG TOTAL) UNDER THE TONGUE EVERY 5 (FIVE) MINUTES AS NEEDED FOR CHEST PAIN. 25 tablet 0  . omega-3 acid ethyl esters (LOVAZA) 1 G capsule Take 1 g by mouth 2 (two) times daily.    . rosuvastatin (CRESTOR) 20 MG tablet TAKE 1 TABLET (20 MG TOTAL) BY MOUTH DAILY. 90 tablet 1  . spironolactone  (ALDACTONE) 25 MG tablet TAKE 1 TABLET BY MOUTH DAILY 90 tablet 3  . warfarin (COUMADIN) 5 MG tablet TAKE 1 TABLET BY MOUTH DAILY EXCEPT 1/2 A TABLET ON MONDAYS AND FRIDAYS 90 tablet 3  . sacubitril-valsartan (ENTRESTO) 24-26 MG Take 1 tablet by mouth 2 (two) times daily. 60 tablet 0   No current facility-administered medications for this visit.    Allergies:  Patient has no known allergies.   Social History: The patient  reports that he has never smoked. He has never used smokeless tobacco. He reports that he drinks alcohol. He reports that he does not use drugs.   Family History: The patient's family history includes Cancer in his mother; Heart attack in his father; Heart disease in his brother.   ROS:  Please see the history of present illness. Otherwise, complete review of systems is positive for none.  All other systems are reviewed and negative.   Physical Exam: VS:  BP 108/70   Pulse 65   Wt 231 lb (104.8 kg)   SpO2 96%   BMI 30.48 kg/m , BMI Body mass index is 30.48 kg/m.  Wt Readings from Last 3 Encounters:  01/23/18 231 lb (104.8 kg)  10/24/17 241 lb (109.3 kg)  10/19/17 236 lb (107 kg)    General: Patient appears comfortable at rest. HEENT: Conjunctiva and lids normal, oropharynx clear. Neck: Supple, no elevated JVP or carotid bruits, no thyromegaly. Lungs: Clear to auscultation, nonlabored breathing at rest. Cardiac: Regular rate and rhythm, no S3 or significant systolic murmur, no pericardial rub. Abdomen: Soft, nontender, bowel sounds present. Extremities: No pitting edema, distal pulses 2+. Skin: Warm and dry. Musculoskeletal: No kyphosis. Neuropsychiatric: Alert and oriented x3, affect grossly appropriate.  ECG: I personally reviewed the tracing from 07/17/2017 which showed sinus rhythm with leftward axis and low voltage, old anterior infarct pattern.  Recent Labwork: 10/19/2017: BUN 19; Creatinine, Ser 1.15; Hemoglobin 15.3; Platelets 156; Potassium 4.4;  Sodium 138  March 2019: Hemoglobin 14.7, platelets 167, BUN 22, creatinine 1.22, potassium 4.5, AST 20, ALT 20, cholesterol 134, triglycerides 94, HDL 49, LDL 66, hemoglobin A1c 6.3, random glucose 114  Other Studies Reviewed Today:  Echocardiogram 01/09/2017: Study Conclusions  - Left ventricle: The cavity size was normal. Wall thickness was increased in a pattern of mild LVH. Systolic function was severely reduced. The estimated ejection fraction was in the range of 25% to 30%. There is akinesis of the mid-apicalanteroseptal, anterior, and apical myocardium. There is akinesis of the apicalinferior myocardium. No definite formed LV mural thrombus noted. There is significant low flow and swirling noted at the apex with Definity contrast. Doppler parameters are consistent with abnormal left ventricular relaxation (grade 1 diastolic dysfunction). - Aortic valve: Trileaflet; mildly calcified leaflets. -  Mitral valve: There was trivial regurgitation. - Left atrium: The atrium was mildly to moderately dilated. - Right ventricle: Pacer wire or catheter noted in right ventricle. - Right atrium: The atrium was mildly dilated. - Tricuspid valve: There was trivial regurgitation. - Pulmonary arteries: PA peak pressure: 26 mm Hg (S). - Pericardium, extracardiac: There was no pericardial effusion.  Impressions:  - Mild LVH with LVEF 25-30%. Large region of mid to apical anteroseptal, anterior, and inferior apical akinesis consistent with ischemic cardiomyopathy. No definite formed LV mural thrombus noted. There is significant low flow and swirling noted at the apex with Definity contrast. Grade 1 diastolic dysfunction. Trivial mitral regurgitation. Mild to moderate left atrial enlargement. Mildly sclerotic aortic valve. Device wire noted within the right heart. Trivial tricuspid regurgitation with PASP 26 mmHg.  Assessment and Plan:  1.  Ischemic  cardiomyopathy with LVEF 25 to 30%.  He is doing well in terms of chronic systolic heart failure symptoms, weight is stable.  We reviewed his medications and will switch from Cozaar to Entresto 24/26 mg twice daily.  Follow-up BMET in 2 weeks.  2.  Patient planning periodontal surgery under sedation per recommendation of Dr. Andree Elk.  He will have to come off Coumadin with Lovenox bridge, this can be arranged through our anticoagulation clinic.  3.  St. Jude ICD in place.  He follows with [name].  No device shocks or syncope.  4.  Paroxysmal atrial fibrillation.  He continues on Coumadin.  We have discussed possibility of switching to DOAC but he has not wanted to make this transition.  5.  OSA on CPAP.  Current medicines were reviewed with the patient today.   Orders Placed This Encounter  Procedures  . Basic metabolic panel    Disposition: Follow-up in 3 months.  Signed, Satira Sark, MD, [facility] 01/23/2018 9:37 AM    [facility] [place], [place], [place] [address] Phone: [phone]; Fax: [phone]

## 2018-01-23 ENCOUNTER — Ambulatory Visit (INDEPENDENT_AMBULATORY_CARE_PROVIDER_SITE_OTHER): Payer: BLUE CROSS/BLUE SHIELD | Admitting: Cardiology

## 2018-01-23 ENCOUNTER — Encounter: Payer: Self-pay | Admitting: Cardiology

## 2018-01-23 VITALS — BP 108/70 | HR 65 | Wt 231.0 lb

## 2018-01-23 DIAGNOSIS — I255 Ischemic cardiomyopathy: Secondary | ICD-10-CM | POA: Diagnosis not present

## 2018-01-23 MED ORDER — SACUBITRIL-VALSARTAN 24-26 MG PO TABS: 1.0000 | ORAL_TABLET | Freq: Two times a day (BID) | ORAL | 0 refills | Status: DC

## 2018-01-23 NOTE — Patient Instructions (Addendum)
Medication Instructions:  Your physician has recommended you make the following change in your medication:    STOP Losartan    START Entresto 24/26 twice daily   Please continue all other medications as prescribed  Labwork:  BMET  In 2 weeks  Orders given today  Testing/Procedures: NONE  Follow-Up: Your physician recommends that you schedule a follow-up appointment in: 3 MONTHS WITH DR. MCDOWELL  Any Other Special Instructions Will Be Listed Below (If Applicable).  If you need a refill on your cardiac medications before your next appointment, please call your pharmacy.

## 2018-02-05 ENCOUNTER — Ambulatory Visit (INDEPENDENT_AMBULATORY_CARE_PROVIDER_SITE_OTHER): Payer: BLUE CROSS/BLUE SHIELD | Admitting: *Deleted

## 2018-02-05 DIAGNOSIS — I4891 Unspecified atrial fibrillation: Secondary | ICD-10-CM

## 2018-02-05 DIAGNOSIS — G4731 Primary central sleep apnea: Secondary | ICD-10-CM | POA: Diagnosis not present

## 2018-02-05 DIAGNOSIS — Z5181 Encounter for therapeutic drug level monitoring: Secondary | ICD-10-CM

## 2018-02-05 LAB — POCT INR: INR: 4.3

## 2018-02-05 NOTE — Patient Instructions (Signed)
Hold coumadin tonight, take 1/2 tablet tomorrow night then resume 1 tablet daily except 1/2 tablet on Mondays and Fridays Recheck in 3 weeks

## 2018-02-06 ENCOUNTER — Other Ambulatory Visit: Payer: Self-pay | Admitting: Cardiology

## 2018-02-14 ENCOUNTER — Other Ambulatory Visit: Payer: Self-pay | Admitting: Cardiology

## 2018-02-14 DIAGNOSIS — I255 Ischemic cardiomyopathy: Secondary | ICD-10-CM | POA: Diagnosis not present

## 2018-02-14 LAB — BASIC METABOLIC PANEL WITH GFR
BUN: 20 mg/dL (ref 7–25)
CHLORIDE: 106 mmol/L (ref 98–110)
CO2: 26 mmol/L (ref 20–32)
Calcium: 9.2 mg/dL (ref 8.6–10.3)
Creat: 1.14 mg/dL (ref 0.70–1.25)
GFR, EST AFRICAN AMERICAN: 77 mL/min/{1.73_m2} (ref >=60)
GFR, EST NON AFRICAN AMERICAN: 66 mL/min/{1.73_m2} (ref >=60)
Glucose, Bld: 118 mg/dL (ref 65–139)
POTASSIUM: 4.5 mmol/L (ref 3.5–5.3)
Sodium: 138 mmol/L (ref 135–146)

## 2018-02-15 ENCOUNTER — Telehealth: Payer: Self-pay

## 2018-02-15 NOTE — Telephone Encounter (Signed)
-----   Message from Satira Sark, MD sent at 02/14/2018  5:25 PM EDT ----- Results reviewed. Renal function and potassium normal following change to Entresto. Continue same. A copy of this test should be forwarded to Rory Percy, MD.

## 2018-02-15 NOTE — Telephone Encounter (Signed)
Patient notified. Routed to PCP 

## 2018-02-19 ENCOUNTER — Other Ambulatory Visit: Payer: Self-pay | Admitting: Cardiology

## 2018-02-28 ENCOUNTER — Ambulatory Visit (INDEPENDENT_AMBULATORY_CARE_PROVIDER_SITE_OTHER): Payer: BLUE CROSS/BLUE SHIELD | Admitting: *Deleted

## 2018-02-28 DIAGNOSIS — Z5181 Encounter for therapeutic drug level monitoring: Secondary | ICD-10-CM | POA: Diagnosis not present

## 2018-02-28 DIAGNOSIS — I4891 Unspecified atrial fibrillation: Secondary | ICD-10-CM

## 2018-02-28 LAB — POCT INR: INR: 2 (ref 2.0–3.0)

## 2018-02-28 NOTE — Patient Instructions (Signed)
Continue coumadin 1 tablet daily except 1/2 tablet on Mondays and Fridays Recheck in 4 weeks  

## 2018-03-08 DIAGNOSIS — G4731 Primary central sleep apnea: Secondary | ICD-10-CM | POA: Diagnosis not present

## 2018-03-14 ENCOUNTER — Other Ambulatory Visit: Payer: Self-pay | Admitting: Cardiology

## 2018-03-14 MED ORDER — BISOPROLOL FUMARATE 5 MG PO TABS: ORAL_TABLET | ORAL | 1 refills | Status: DC

## 2018-03-14 NOTE — Telephone Encounter (Signed)
° ° ° °  1. Which medications need to be refilled? (please list name of each medication and dose if known)   bisoprolol (ZEBETA) 5 MG tablet    2. Which pharmacy/location (including street and city if local pharmacy) is medication to be sent to?   PLEASE SEND TO [facility]    3. Do they need a 30 day or 90 day supply?

## 2018-03-14 NOTE — Telephone Encounter (Signed)
Medication sent to pharmacy  

## 2018-03-26 ENCOUNTER — Ambulatory Visit (INDEPENDENT_AMBULATORY_CARE_PROVIDER_SITE_OTHER): Payer: BLUE CROSS/BLUE SHIELD | Admitting: *Deleted

## 2018-03-26 ENCOUNTER — Telehealth: Payer: Self-pay | Admitting: Cardiology

## 2018-03-26 DIAGNOSIS — I255 Ischemic cardiomyopathy: Secondary | ICD-10-CM | POA: Diagnosis not present

## 2018-03-26 NOTE — Telephone Encounter (Signed)
LMOVM reminding pt to send remote transmission.   

## 2018-03-27 NOTE — Progress Notes (Signed)
Remote ICD transmission.   

## 2018-03-28 ENCOUNTER — Ambulatory Visit (INDEPENDENT_AMBULATORY_CARE_PROVIDER_SITE_OTHER): Payer: BLUE CROSS/BLUE SHIELD | Admitting: *Deleted

## 2018-03-28 DIAGNOSIS — Z5181 Encounter for therapeutic drug level monitoring: Secondary | ICD-10-CM | POA: Diagnosis not present

## 2018-03-28 DIAGNOSIS — I4891 Unspecified atrial fibrillation: Secondary | ICD-10-CM

## 2018-03-28 LAB — POCT INR: INR: 2.4 (ref 2.0–3.0)

## 2018-03-28 NOTE — Patient Instructions (Signed)
Continue coumadin 1 tablet daily except 1/2 tablet on Mondays and Fridays Recheck in 4 weeks  

## 2018-04-10 LAB — CUP PACEART REMOTE DEVICE CHECK
Battery Remaining Longevity: 94 mo
Battery Voltage: 3.1 V
Date Time Interrogation Session: 20190709031825
HIGH POWER IMPEDANCE MEASURED VALUE: 46 Ohm
HighPow Impedance: 46 Ohm
Implantable Lead Model: 148
Implantable Lead Serial Number: 116740
Lead Channel Sensing Intrinsic Amplitude: 12 mV
Lead Channel Setting Pacing Amplitude: 2.5 V
MDC IDC LEAD IMPLANT DT: 20020111
MDC IDC LEAD LOCATION: 753860
MDC IDC MSMT BATTERY REMAINING PERCENTAGE: 89 %
MDC IDC MSMT LEADCHNL RV IMPEDANCE VALUE: 590 Ohm
MDC IDC MSMT LEADCHNL RV PACING THRESHOLD AMPLITUDE: 0.75 V
MDC IDC MSMT LEADCHNL RV PACING THRESHOLD PULSEWIDTH: 0.5 ms
MDC IDC PG IMPLANT DT: 20180503
MDC IDC PG SERIAL: 7421148
MDC IDC SET LEADCHNL RV PACING PULSEWIDTH: 0.5 ms
MDC IDC SET LEADCHNL RV SENSING SENSITIVITY: 0.5 mV
MDC IDC STAT BRADY RV PERCENT PACED: 1.5 %

## 2018-04-15 ENCOUNTER — Other Ambulatory Visit: Payer: Self-pay | Admitting: Cardiology

## 2018-04-23 IMAGING — NM NM MYOCAR MULTI W/SPECT W/WALL MOTION & EF
2 series · 12 of 12 positions shown · non-contrast
Comparison: none

[Series 1: rest · 8.28mm/px · 6 of 64 frames shown]
[frame 6/64]
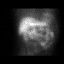
[frame 16/64]
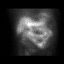
[frame 27/64]
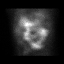
[frame 38/64]
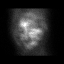
[frame 48/64]
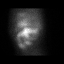
[frame 59/64]
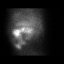

[Series 2: stress gated · 8.28mm/px · 6 of 64 frames shown]
[frame 6/64]
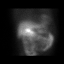
[frame 16/64]
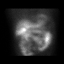
[frame 27/64]
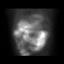
[frame 38/64]
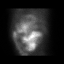
[frame 48/64]
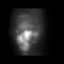
[frame 59/64]
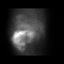

[12 of 12 positions shown; findings below may reference images not displayed]

Canned report from images found in remote index.

Refer to host system for actual result text.

## 2018-04-24 ENCOUNTER — Ambulatory Visit: Payer: BLUE CROSS/BLUE SHIELD | Admitting: Urology

## 2018-04-24 DIAGNOSIS — N401 Enlarged prostate with lower urinary tract symptoms: Secondary | ICD-10-CM

## 2018-04-24 DIAGNOSIS — R351 Nocturia: Secondary | ICD-10-CM

## 2018-05-02 ENCOUNTER — Other Ambulatory Visit: Payer: Self-pay | Admitting: Urology

## 2018-05-07 NOTE — Progress Notes (Signed)
Cardiology Office Note  Date: 05/08/2018   ID: [name], DOB 09-06-1951, MRN [id number]  PCP: Rory Percy, MD  Primary Cardiologist: Rozann Lesches, MD   Chief Complaint  Patient presents with  . Cardiomyopathy    History of Present Illness: [name] is a 67 y.o. male last seen in May.  He is here for a routine follow-up visit.  States that he generally feels well, NYHA class II dyspnea, no exertional chest pain, palpitations, or syncope. We switched from Cozaar to [facility] after the last visit.  Follow-up lab work is outlined below.  He has tolerated this well.  Still enjoying retirement.  He has been taking care of grandkids over the summer.  He remains on Coumadin with follow-up in the anticoagulation.  Last INR 2.4.  He reports no bleeding episodes.  He follows with [name] in the device clinic, [place] ICD in place.  He has had no device shocks.  We went over his medications, he reports no intolerances.  I personally reviewed his ECG today which shows sinus bradycardia with old anterior/inferior infarct pattern.  Past Medical History:  Diagnosis Date  . A-V fistula ([place])   . AICD (automatic cardioverter/defibrillator) present 2006   ICD  . Anterior myocardial infarction ([place])   . Chronic systolic heart failure ([place])   . Coronary atherosclerosis of native coronary artery    a. s/p BMS to LAD and OM1 b. patent stents by cath in 2013  . History of hiatal hernia   . Hypertension   . Inducible ventricular tachycardia ([place])   . Ischemic cardiomyopathy    LVEF 30%, status post ICD  . Paroxysmal atrial fibrillation (HCC)    On coumadin  . Pseudoaneurysm ([place])   . Sleep apnea    cpap  . Stroke [facility])     Past Surgical History:  Procedure Laterality Date  . AICD implantation  2011   St Jude ICD implanted for primary prevention of sudden death  . CATARACT EXTRACTION W/PHACO Left 03/27/2017   Procedure: CATARACT EXTRACTION PHACO AND INTRAOCULAR  LENS PLACEMENT (IOC);  Surgeon: [name], MD;  Location: [facility];  Service: Ophthalmology;  Laterality: Left;  CDE: 8.48  . CATARACT EXTRACTION W/PHACO Right 05/29/2017   Procedure: CATARACT EXTRACTION PHACO AND INTRAOCULAR LENS PLACEMENT (IOC);  Surgeon: [name], MD;  Location: [facility];  Service: Ophthalmology;  Laterality: Right;  CDE: 7.48  . EYE SURGERY    . ICD GENERATOR CHANGEOUT N/A 01/19/2017   SJM Ellipse VR ICD generator change by Dr [name]  . LAPAROSCOPY N/A 10/23/2017   Procedure: LAPAROSCOPY DIAGNOSTIC;  Surgeon: [name], MD;  Location: [place];  Service: General;  Laterality: N/A;  . VENTRAL HERNIA REPAIR N/A 10/23/2017   Procedure: [place];  Surgeon: [name], MD;  Location: [place];  Service: General;  Laterality: N/A;    Current Outpatient Medications  Medication Sig Dispense Refill  . aspirin 81 MG tablet Take 81 mg by mouth daily.      . bisoprolol (ZEBETA) 5 MG tablet TAKE 1 TABLET (5 MG TOTAL) BY MOUTH DAILY. 90 tablet 1  . calcium carbonate (TUMS - DOSED IN MG ELEMENTAL CALCIUM) 500 MG chewable tablet Chew 1-2 tablets by mouth daily as needed for indigestion or heartburn.    [name] ENTRESTO 24-26 MG TAKE 1 TABLET BY MOUTH TWICE A DAY 60 tablet 6  . finasteride (PROSCAR) 5 MG tablet Take 5 mg by mouth Daily.     [name]  nitroGLYCERIN (NITROSTAT) 0.4 MG SL tablet Place 1 tablet (0.4 mg total) under the tongue every 5 (five) minutes as needed for chest pain. 25 tablet 0  . omega-3 acid ethyl esters (LOVAZA) 1 G capsule Take 1 g by mouth 2 (two) times daily.    . rosuvastatin (CRESTOR) 20 MG tablet TAKE 1 TABLET (20 MG TOTAL) BY MOUTH DAILY. 90 tablet 0  . spironolactone (ALDACTONE) 25 MG tablet TAKE 1 TABLET BY MOUTH DAILY 90 tablet 3  . warfarin (COUMADIN) 5 MG tablet TAKE 1 TABLET BY MOUTH DAILY EXCEPT 1/2 A TABLET ON MONDAYS AND FRIDAYS 90 tablet 3   No current facility-administered medications for this visit.    Allergies:  Patient has no known  allergies.   Social History: The patient  reports that he has never smoked. He has never used smokeless tobacco. He reports that he drinks alcohol. He reports that he does not use drugs.   ROS:  Please see the history of present illness. Otherwise, complete review of systems is positive for urinary frequency.  All other systems are reviewed and negative.   Physical Exam: VS:  BP 98/62   Pulse (!) 55   Ht 6\' 1"  (1.854 m)   Wt 228 lb (103.4 kg)   SpO2 98%   BMI 30.08 kg/m , BMI Body mass index is 30.08 kg/m.  Wt Readings from Last 3 Encounters:  05/08/18 228 lb (103.4 kg)  01/23/18 231 lb (104.8 kg)  10/24/17 241 lb (109.3 kg)    General: Patient appears comfortable at rest. HEENT: Conjunctiva and lids normal, oropharynx clear. Neck: Supple, no elevated JVP or carotid bruits, no thyromegaly. Lungs: Clear to auscultation, nonlabored breathing at rest. Cardiac: Regular rate and rhythm, no S3 or significant systolic murmur. Abdomen: Soft, nontender, bowel sounds present. Extremities: No pitting edema, distal pulses 2+. Skin: Warm and dry. Musculoskeletal: No kyphosis. Neuropsychiatric: Alert and oriented x3, affect grossly appropriate.  ECG: I personally reviewed the tracing from 07/17/2017 which showed sinus rhythm with leftward axis and low voltage, old anterior infarct pattern.  Recent Labwork: 10/19/2017: Hemoglobin 15.3; Platelets 156 02/14/2018: BUN 20; Creat 1.14; Potassium 4.5; Sodium 138   Other Studies Reviewed Today:  Echocardiogram 01/09/2017: Study Conclusions  - Left ventricle: The cavity size was normal. Wall thickness was increased in a pattern of mild LVH. Systolic function was severely reduced. The estimated ejection fraction was in the range of 25% to 30%. There is akinesis of the mid-apicalanteroseptal, anterior, and apical myocardium. There is akinesis of the apicalinferior myocardium. No definite formed LV mural thrombus noted. There is  significant low flow and swirling noted at the apex with Definity contrast. Doppler parameters are consistent with abnormal left ventricular relaxation (grade 1 diastolic dysfunction). - Aortic valve: Trileaflet; mildly calcified leaflets. - Mitral valve: There was trivial regurgitation. - Left atrium: The atrium was mildly to moderately dilated. - Right ventricle: Pacer wire or catheter noted in right ventricle. - Right atrium: The atrium was mildly dilated. - Tricuspid valve: There was trivial regurgitation. - Pulmonary arteries: PA peak pressure: 26 mm Hg (S). - Pericardium, extracardiac: There was no pericardial effusion.  Impressions:  - Mild LVH with LVEF 25-30%. Large region of mid to apical anteroseptal, anterior, and inferior apical akinesis consistent with ischemic cardiomyopathy. No definite formed LV mural thrombus noted. There is significant low flow and swirling noted at the apex with Definity contrast. Grade 1 diastolic dysfunction. Trivial mitral regurgitation. Mild to moderate left atrial enlargement. Mildly sclerotic aortic  valve. Device wire noted within the right heart. Trivial tricuspid regurgitation with PASP 26 mmHg.  Assessment and Plan:  1.  Ischemic cardiomyopathy with LVEF 25 to 30%.  He remains symptomatically stable, he tolerated switch from Cozaar to [place] which we will continue.  No other changes made to present cardiac regimen.  Follow-up echocardiogram will be obtained in comparison to April 2018 study.  2.  St. Jude ICD in place.  He reports no device shocks or syncope and continues to follow with [name] in the device clinic.  3.  Paroxysmal atrial fibrillation, he continues on Coumadin with follow-up in the anticoagulation clinic.  4.  OSA on CPAP.  He reports compliance.  5.  History of stroke, we have also continued low-dose aspirin.  Current medicines were reviewed with the patient today.   Orders Placed This  Encounter  Procedures  . EKG 12-Lead  . ECHOCARDIOGRAM COMPLETE    Disposition: Follow-up in 4 months.  Signed, Satira Sark, MD, [facility] 05/08/2018 9:30 AM    [facility] [place] [facility] [place], [place], [place] [address] Phone: [phone]; Fax: [phone]

## 2018-05-08 ENCOUNTER — Ambulatory Visit: Payer: BLUE CROSS/BLUE SHIELD | Admitting: Cardiology

## 2018-05-08 ENCOUNTER — Encounter: Payer: Self-pay | Admitting: Cardiology

## 2018-05-08 VITALS — BP 98/62 | HR 55 | Ht 73.0 in | Wt 228.0 lb

## 2018-05-08 DIAGNOSIS — I480 Paroxysmal atrial fibrillation: Secondary | ICD-10-CM | POA: Diagnosis not present

## 2018-05-08 DIAGNOSIS — I2510 Atherosclerotic heart disease of native coronary artery without angina pectoris: Secondary | ICD-10-CM | POA: Diagnosis not present

## 2018-05-08 DIAGNOSIS — I255 Ischemic cardiomyopathy: Secondary | ICD-10-CM | POA: Diagnosis not present

## 2018-05-08 DIAGNOSIS — G4733 Obstructive sleep apnea (adult) (pediatric): Secondary | ICD-10-CM

## 2018-05-08 DIAGNOSIS — Z8673 Personal history of transient ischemic attack (TIA), and cerebral infarction without residual deficits: Secondary | ICD-10-CM

## 2018-05-08 MED ORDER — NITROGLYCERIN 0.4 MG SL SUBL: 0.4000 mg | SUBLINGUAL_TABLET | SUBLINGUAL | 0 refills | Status: DC | PRN

## 2018-05-08 NOTE — Patient Instructions (Signed)
Medication Instructions:  Your physician recommends that you continue on your current medications as directed. Please refer to the Current Medication list given to you today.  Labwork: NONE  Testing/Procedures: Your physician has requested that you have an echocardiogram. Echocardiography is a painless test that uses sound waves to create images of your heart. It provides your doctor with information about the size and shape of your heart and how well your heart's chambers and valves are working. This procedure takes approximately one hour. There are no restrictions for this procedure.  Follow-Up: Your physician wants you to follow-up in: [place]. You will receive a reminder letter in the mail two months in advance. If you don't receive a letter, please call our office to schedule the follow-up appointment.  Any Other Special Instructions Will Be Listed Below (If Applicable).  If you need a refill on your cardiac medications before your next appointment, please call your pharmacy.

## 2018-05-09 ENCOUNTER — Other Ambulatory Visit: Payer: Self-pay | Admitting: Urology

## 2018-05-09 DIAGNOSIS — N401 Enlarged prostate with lower urinary tract symptoms: Principal | ICD-10-CM

## 2018-05-09 DIAGNOSIS — N138 Other obstructive and reflux uropathy: Secondary | ICD-10-CM

## 2018-05-22 ENCOUNTER — Ambulatory Visit (INDEPENDENT_AMBULATORY_CARE_PROVIDER_SITE_OTHER): Payer: BLUE CROSS/BLUE SHIELD | Admitting: *Deleted

## 2018-05-22 ENCOUNTER — Other Ambulatory Visit: Payer: Self-pay

## 2018-05-22 ENCOUNTER — Ambulatory Visit (INDEPENDENT_AMBULATORY_CARE_PROVIDER_SITE_OTHER): Payer: BLUE CROSS/BLUE SHIELD

## 2018-05-22 DIAGNOSIS — I255 Ischemic cardiomyopathy: Secondary | ICD-10-CM | POA: Diagnosis not present

## 2018-05-22 DIAGNOSIS — I4891 Unspecified atrial fibrillation: Secondary | ICD-10-CM | POA: Diagnosis not present

## 2018-05-22 DIAGNOSIS — Z5181 Encounter for therapeutic drug level monitoring: Secondary | ICD-10-CM

## 2018-05-22 LAB — POCT INR: INR: 3.7 — AB (ref 2.0–3.0)

## 2018-05-22 NOTE — Patient Instructions (Signed)
Hold coumadin tonight then resume 1 tablet daily except 1/2 tablet on Mondays and Fridays Recheck in 3 weeks

## 2018-05-23 ENCOUNTER — Telehealth: Payer: Self-pay | Admitting: *Deleted

## 2018-05-23 NOTE — Telephone Encounter (Signed)
-----   Message from Satira Sark, MD sent at 05/22/2018 12:57 PM EDT ----- Results reviewed.  LVEF is stable to mildly increased, now in the range of 30 to 35%.  Continue with current plan.  A copy of this test should be forwarded to Rory Percy, MD.

## 2018-05-23 NOTE — Telephone Encounter (Signed)
Patient informed and copy sent to PCP. 

## 2018-05-25 ENCOUNTER — Encounter: Payer: Self-pay | Admitting: Internal Medicine

## 2018-05-25 ENCOUNTER — Ambulatory Visit (INDEPENDENT_AMBULATORY_CARE_PROVIDER_SITE_OTHER): Payer: BLUE CROSS/BLUE SHIELD | Admitting: Internal Medicine

## 2018-05-25 DIAGNOSIS — I255 Ischemic cardiomyopathy: Secondary | ICD-10-CM | POA: Diagnosis not present

## 2018-05-25 DIAGNOSIS — I5022 Chronic systolic (congestive) heart failure: Secondary | ICD-10-CM | POA: Diagnosis not present

## 2018-05-25 NOTE — Progress Notes (Signed)
PCP: Rory Percy, MD Primary Cardiologist: Dr Domenic Polite Primary EP: Dr Elyn Aquas is a 67 y.o. male who presents today for routine electrophysiology followup.  Since last being seen in our clinic, the patient reports doing very well.  Today, he denies symptoms of palpitations, chest pain, shortness of breath,  lower extremity edema, dizziness, presyncope, syncope, or ICD shocks.  The patient is otherwise without complaint today.   Past Medical History:  Diagnosis Date  . A-V fistula ([place])   . AICD (automatic cardioverter/defibrillator) present 2006   ICD  . Anterior myocardial infarction ([place])   . Chronic systolic heart failure ([place])   . Coronary atherosclerosis of native coronary artery    a. s/p BMS to LAD and OM1 b. patent stents by cath in 2013  . History of hiatal hernia   . Hypertension   . Inducible ventricular tachycardia ([place])   . Ischemic cardiomyopathy    LVEF 30%, status post ICD  . Paroxysmal atrial fibrillation (HCC)    On coumadin  . Pseudoaneurysm ([place])   . Sleep apnea    cpap  . Stroke [facility])    Past Surgical History:  Procedure Laterality Date  . AICD implantation  2011   St Jude ICD implanted for primary prevention of sudden death  . CATARACT EXTRACTION W/PHACO Left 03/27/2017   Procedure: CATARACT EXTRACTION PHACO AND INTRAOCULAR LENS PLACEMENT (IOC);  Surgeon: [name], MD;  Location: [facility];  Service: Ophthalmology;  Laterality: Left;  CDE: 8.48  . CATARACT EXTRACTION W/PHACO Right 05/29/2017   Procedure: CATARACT EXTRACTION PHACO AND INTRAOCULAR LENS PLACEMENT (IOC);  Surgeon: [name], MD;  Location[facility];  Service: Ophthalmology;  Laterality: Right;  CDE: 7.48  . EYE SURGERY    . ICD GENERATOR CHANGEOUT N/A 01/19/2017   SJM Ellipse VR ICD generator change by Dr [name]  . LAPAROSCOPY N/A 10/23/2017   Procedure: LAPAROSCOPY DIAGNOSTIC;  Surgeon: [name], MD;  Location: [place];  Service: General;  Laterality: N/A;  . VENTRAL  HERNIA REPAIR N/A 10/23/2017   Procedure: [place];  Surgeon: [name], MD;  Location: [place];  Service: General;  Laterality: N/A;    ROS- all systems are reviewed and negative except as per HPI above  Current Outpatient Medications  Medication Sig Dispense Refill  . aspirin 81 MG tablet Take 81 mg by mouth daily.      . bisoprolol (ZEBETA) 5 MG tablet TAKE 1 TABLET (5 MG TOTAL) BY MOUTH DAILY. 90 tablet 1  . calcium carbonate (TUMS - DOSED IN MG ELEMENTAL CALCIUM) 500 MG chewable tablet Chew 1-2 tablets by mouth daily as needed for indigestion or heartburn.    [name] ENTRESTO 24-26 MG TAKE 1 TABLET BY MOUTH TWICE A DAY 60 tablet 6  . finasteride (PROSCAR) 5 MG tablet Take 5 mg by mouth Daily.     . nitroGLYCERIN (NITROSTAT) 0.4 MG SL tablet Place 1 tablet (0.4 mg total) under the tongue every 5 (five) minutes as needed for chest pain. 25 tablet 0  . omega-3 acid ethyl esters (LOVAZA) 1 G capsule Take 1 g by mouth 2 (two) times daily.    . rosuvastatin (CRESTOR) 20 MG tablet TAKE 1 TABLET (20 MG TOTAL) BY MOUTH DAILY. 90 tablet 0  . spironolactone (ALDACTONE) 25 MG tablet TAKE 1 TABLET BY MOUTH DAILY 90 tablet 3  . warfarin (COUMADIN) 5 MG tablet TAKE 1 TABLET BY MOUTH DAILY EXCEPT 1/2 A TABLET ON MONDAYS AND FRIDAYS 90  tablet 3   No current facility-administered medications for this visit.     Physical Exam: Vitals:   05/25/18 0852  BP: 92/60  Pulse: (!) 54  SpO2: 96%  Weight: 228 lb (103.4 kg)  Height: 6\' 1"  (1.854 m)    GEN- The patient is well appearing, alert and oriented x 3 today.   Head- normocephalic, atraumatic Eyes-  Sclera clear, conjunctiva pink Ears- hearing intact Oropharynx- clear Lungs- Clear to ausculation bilaterally, normal work of breathing Chest- ICD pocket is well healed Heart- Regular rate and rhythm, no murmurs, rubs or gallops, PMI not laterally displaced GI- soft, NT, ND, + BS Extremities- no clubbing, cyanosis, or edema  ICD  interrogation- reviewed in detail today,  See PACEART report    Wt Readings from Last 3 Encounters:  05/25/18 228 lb (103.4 kg)  05/08/18 228 lb (103.4 kg)  01/23/18 231 lb (104.8 kg)    Assessment and Plan:  1.  Chronic systolic dysfunction/ ischemic CM/ CAD euvolemic today No ischemic symtoms Stable on an appropriate medical regimen Normal ICD function See Pace Art report No changes today Not followed in ICM device clinic  2. afib Well controlled On coumadin for prior embolic stroke related to low EF  3. Overweight Body mass index is 30.08 kg/m. Wt Readings from Last 3 Encounters:  05/25/18 228 lb (103.4 kg)  05/08/18 228 lb (103.4 kg)  01/23/18 231 lb (104.8 kg)   Merlin Return to see me in a year Enroll in [facility] clinic,  He is very interested in this. Dr Domenic Polite to see as scheduled  Thompson Grayer MD, [facility] 05/25/2018 9:17 AM

## 2018-05-25 NOTE — Patient Instructions (Addendum)
Medication Instructions:  Continue all current medications.  Labwork: none  Testing/Procedures: none  Follow-Up: Your physician wants you to follow up in:  1 year.  You will receive a reminder letter in the mail one-two months in advance.  If you don't receive a letter, please call our office to schedule the follow up appointment   Any Other Special Instructions Will Be Listed Below (If Applicable).  Remote monitoring is used to monitor your Pacemaker of ICD from home. This monitoring reduces the number of office visits required to check your device to one time per year. It allows [place] to keep an eye on the functioning of your device to ensure it is working properly. You are scheduled for a device check from home on 06/26/2018. You may send your transmission at any time that day. If you have a wireless device, the transmission will be sent automatically. After your physician reviews your transmission, you will receive a postcard with your next transmission date.  Enroll in [facility] clinic.   If you need a refill on your cardiac medications before your next appointment, please call your pharmacy.

## 2018-05-27 LAB — CUP PACEART INCLINIC DEVICE CHECK
Battery Remaining Longevity: 94 mo
Date Time Interrogation Session: 20190906125244
HighPow Impedance: 50.5432
Implantable Lead Implant Date: 20020111
Implantable Lead Location: 753860
Implantable Lead Serial Number: 116740
Lead Channel Pacing Threshold Amplitude: 0.75 V
Lead Channel Pacing Threshold Amplitude: 0.75 V
Lead Channel Pacing Threshold Pulse Width: 0.5 ms
Lead Channel Pacing Threshold Pulse Width: 0.5 ms
Lead Channel Setting Pacing Amplitude: 2.5 V
Lead Channel Setting Sensing Sensitivity: 0.5 mV
MDC IDC MSMT LEADCHNL RV IMPEDANCE VALUE: 662.5 Ohm
MDC IDC MSMT LEADCHNL RV SENSING INTR AMPL: 12 mV
MDC IDC PG IMPLANT DT: 20180503
MDC IDC SET LEADCHNL RV PACING PULSEWIDTH: 0.5 ms
MDC IDC STAT BRADY RV PERCENT PACED: 1.5 %
Pulse Gen Serial Number: 7421148

## 2018-05-28 DIAGNOSIS — Z6831 Body mass index (BMI) 31.0-31.9, adult: Secondary | ICD-10-CM | POA: Diagnosis not present

## 2018-05-28 DIAGNOSIS — R739 Hyperglycemia, unspecified: Secondary | ICD-10-CM | POA: Diagnosis not present

## 2018-05-30 ENCOUNTER — Ambulatory Visit (HOSPITAL_COMMUNITY)
Admission: RE | Admit: 2018-05-30 | Discharge: 2018-05-30 | Disposition: A | Discharge status: Home / Self Care | Payer: BLUE CROSS/BLUE SHIELD | Source: Ambulatory Visit | Attending: Urology | Admitting: Urology

## 2018-05-30 DIAGNOSIS — N401 Enlarged prostate with lower urinary tract symptoms: Secondary | ICD-10-CM | POA: Diagnosis not present

## 2018-05-30 DIAGNOSIS — N138 Other obstructive and reflux uropathy: Secondary | ICD-10-CM

## 2018-06-12 ENCOUNTER — Ambulatory Visit (INDEPENDENT_AMBULATORY_CARE_PROVIDER_SITE_OTHER): Payer: BLUE CROSS/BLUE SHIELD | Admitting: *Deleted

## 2018-06-12 DIAGNOSIS — Z5181 Encounter for therapeutic drug level monitoring: Secondary | ICD-10-CM

## 2018-06-12 DIAGNOSIS — I4891 Unspecified atrial fibrillation: Secondary | ICD-10-CM | POA: Diagnosis not present

## 2018-06-12 LAB — POCT INR: INR: 3.4 — AB (ref 2.0–3.0)

## 2018-06-12 NOTE — Patient Instructions (Signed)
Take coumadin 1/2 tablet tonight then decrease dose to 1 tablet daily except 1/2 tablet on Mondays, Wednesdays and Fridays Recheck in 3 weeks

## 2018-06-18 DIAGNOSIS — I2510 Atherosclerotic heart disease of native coronary artery without angina pectoris: Secondary | ICD-10-CM | POA: Diagnosis not present

## 2018-06-18 DIAGNOSIS — E7800 Pure hypercholesterolemia, unspecified: Secondary | ICD-10-CM | POA: Diagnosis not present

## 2018-06-18 DIAGNOSIS — R109 Unspecified abdominal pain: Secondary | ICD-10-CM | POA: Diagnosis not present

## 2018-06-18 DIAGNOSIS — R079 Chest pain, unspecified: Secondary | ICD-10-CM | POA: Diagnosis not present

## 2018-06-18 DIAGNOSIS — I4891 Unspecified atrial fibrillation: Secondary | ICD-10-CM | POA: Diagnosis not present

## 2018-06-18 DIAGNOSIS — I10 Essential (primary) hypertension: Secondary | ICD-10-CM | POA: Diagnosis not present

## 2018-06-18 DIAGNOSIS — Z95810 Presence of automatic (implantable) cardiac defibrillator: Secondary | ICD-10-CM | POA: Diagnosis not present

## 2018-06-18 DIAGNOSIS — I252 Old myocardial infarction: Secondary | ICD-10-CM | POA: Diagnosis not present

## 2018-06-18 DIAGNOSIS — R1110 Vomiting, unspecified: Secondary | ICD-10-CM | POA: Diagnosis not present

## 2018-06-18 DIAGNOSIS — Z7982 Long term (current) use of aspirin: Secondary | ICD-10-CM | POA: Diagnosis not present

## 2018-06-18 DIAGNOSIS — R112 Nausea with vomiting, unspecified: Secondary | ICD-10-CM | POA: Diagnosis not present

## 2018-06-18 DIAGNOSIS — Z79899 Other long term (current) drug therapy: Secondary | ICD-10-CM | POA: Diagnosis not present

## 2018-06-18 DIAGNOSIS — R001 Bradycardia, unspecified: Secondary | ICD-10-CM | POA: Diagnosis not present

## 2018-06-18 DIAGNOSIS — Z7901 Long term (current) use of anticoagulants: Secondary | ICD-10-CM | POA: Diagnosis not present

## 2018-06-19 ENCOUNTER — Observation Stay (HOSPITAL_COMMUNITY): Payer: BLUE CROSS/BLUE SHIELD

## 2018-06-19 ENCOUNTER — Other Ambulatory Visit: Payer: Self-pay

## 2018-06-19 ENCOUNTER — Encounter (HOSPITAL_COMMUNITY): Payer: Self-pay

## 2018-06-19 ENCOUNTER — Inpatient Hospital Stay (HOSPITAL_COMMUNITY)
Admission: EM | Admit: 2018-06-19 | Discharge: 2018-06-22 | DRG: 287 | Disposition: A | Discharge status: Home / Self Care | Payer: BLUE CROSS/BLUE SHIELD | Source: Other Acute Inpatient Hospital | Attending: Cardiology | Admitting: Cardiology

## 2018-06-19 DIAGNOSIS — Z79899 Other long term (current) drug therapy: Secondary | ICD-10-CM

## 2018-06-19 DIAGNOSIS — R001 Bradycardia, unspecified: Secondary | ICD-10-CM | POA: Diagnosis present

## 2018-06-19 DIAGNOSIS — I4891 Unspecified atrial fibrillation: Secondary | ICD-10-CM | POA: Diagnosis present

## 2018-06-19 DIAGNOSIS — R109 Unspecified abdominal pain: Secondary | ICD-10-CM | POA: Diagnosis not present

## 2018-06-19 DIAGNOSIS — Z955 Presence of coronary angioplasty implant and graft: Secondary | ICD-10-CM

## 2018-06-19 DIAGNOSIS — R079 Chest pain, unspecified: Secondary | ICD-10-CM

## 2018-06-19 DIAGNOSIS — I255 Ischemic cardiomyopathy: Secondary | ICD-10-CM | POA: Diagnosis present

## 2018-06-19 DIAGNOSIS — R9439 Abnormal result of other cardiovascular function study: Secondary | ICD-10-CM | POA: Diagnosis not present

## 2018-06-19 DIAGNOSIS — Z8673 Personal history of transient ischemic attack (TIA), and cerebral infarction without residual deficits: Secondary | ICD-10-CM | POA: Diagnosis not present

## 2018-06-19 DIAGNOSIS — G4730 Sleep apnea, unspecified: Secondary | ICD-10-CM | POA: Diagnosis present

## 2018-06-19 DIAGNOSIS — I2510 Atherosclerotic heart disease of native coronary artery without angina pectoris: Secondary | ICD-10-CM | POA: Diagnosis not present

## 2018-06-19 DIAGNOSIS — Z7901 Long term (current) use of anticoagulants: Secondary | ICD-10-CM

## 2018-06-19 DIAGNOSIS — I25110 Atherosclerotic heart disease of native coronary artery with unstable angina pectoris: Secondary | ICD-10-CM | POA: Diagnosis not present

## 2018-06-19 DIAGNOSIS — R0789 Other chest pain: Secondary | ICD-10-CM | POA: Diagnosis not present

## 2018-06-19 DIAGNOSIS — I110 Hypertensive heart disease with heart failure: Secondary | ICD-10-CM | POA: Diagnosis present

## 2018-06-19 DIAGNOSIS — Z8249 Family history of ischemic heart disease and other diseases of the circulatory system: Secondary | ICD-10-CM | POA: Diagnosis not present

## 2018-06-19 DIAGNOSIS — R112 Nausea with vomiting, unspecified: Secondary | ICD-10-CM | POA: Diagnosis not present

## 2018-06-19 DIAGNOSIS — I2089 Other forms of angina pectoris: Secondary | ICD-10-CM

## 2018-06-19 DIAGNOSIS — Z7982 Long term (current) use of aspirin: Secondary | ICD-10-CM

## 2018-06-19 DIAGNOSIS — I358 Other nonrheumatic aortic valve disorders: Secondary | ICD-10-CM | POA: Diagnosis not present

## 2018-06-19 DIAGNOSIS — E7800 Pure hypercholesterolemia, unspecified: Secondary | ICD-10-CM | POA: Diagnosis not present

## 2018-06-19 DIAGNOSIS — Z95810 Presence of automatic (implantable) cardiac defibrillator: Secondary | ICD-10-CM | POA: Diagnosis not present

## 2018-06-19 DIAGNOSIS — Z7401 Bed confinement status: Secondary | ICD-10-CM | POA: Diagnosis not present

## 2018-06-19 DIAGNOSIS — N400 Enlarged prostate without lower urinary tract symptoms: Secondary | ICD-10-CM | POA: Diagnosis present

## 2018-06-19 DIAGNOSIS — I252 Old myocardial infarction: Secondary | ICD-10-CM | POA: Diagnosis not present

## 2018-06-19 DIAGNOSIS — I25119 Atherosclerotic heart disease of native coronary artery with unspecified angina pectoris: Secondary | ICD-10-CM | POA: Diagnosis not present

## 2018-06-19 DIAGNOSIS — R9431 Abnormal electrocardiogram [ECG] [EKG]: Secondary | ICD-10-CM

## 2018-06-19 DIAGNOSIS — E782 Mixed hyperlipidemia: Secondary | ICD-10-CM | POA: Diagnosis present

## 2018-06-19 DIAGNOSIS — Z9842 Cataract extraction status, left eye: Secondary | ICD-10-CM | POA: Diagnosis not present

## 2018-06-19 DIAGNOSIS — I200 Unstable angina: Secondary | ICD-10-CM

## 2018-06-19 DIAGNOSIS — I480 Paroxysmal atrial fibrillation: Secondary | ICD-10-CM | POA: Diagnosis present

## 2018-06-19 DIAGNOSIS — Z961 Presence of intraocular lens: Secondary | ICD-10-CM | POA: Diagnosis not present

## 2018-06-19 DIAGNOSIS — I5022 Chronic systolic (congestive) heart failure: Secondary | ICD-10-CM | POA: Diagnosis not present

## 2018-06-19 DIAGNOSIS — I10 Essential (primary) hypertension: Secondary | ICD-10-CM | POA: Diagnosis not present

## 2018-06-19 DIAGNOSIS — Z9841 Cataract extraction status, right eye: Secondary | ICD-10-CM

## 2018-06-19 DIAGNOSIS — I208 Other forms of angina pectoris: Secondary | ICD-10-CM

## 2018-06-19 LAB — NM MYOCAR MULTI W/SPECT W/WALL MOTION / EF
Estimated workload: 1 METS
Exercise duration (min): 5 min
Exercise duration (sec): 0 s
MPHR: 152 {beats}/min
Peak HR: 74 {beats}/min
Percent HR: 48 %
Rest HR: 53 {beats}/min

## 2018-06-19 LAB — PROTIME-INR
INR: 2.11
Prothrombin Time: 23.5 seconds — ABNORMAL HIGH (ref 11.4–15.2)

## 2018-06-19 LAB — COMPREHENSIVE METABOLIC PANEL
ALK PHOS: 47 U/L (ref 38–126)
ALT: 18 U/L (ref 0–44)
ANION GAP: 6 (ref 5–15)
AST: 19 U/L (ref 15–41)
Albumin: 3.8 g/dL (ref 3.5–5.0)
BILIRUBIN TOTAL: 1.4 mg/dL — AB (ref 0.3–1.2)
BUN: 21 mg/dL (ref 8–23)
CALCIUM: 8.9 mg/dL (ref 8.9–10.3)
CO2: 27 mmol/L (ref 22–32)
Chloride: 104 mmol/L (ref 98–111)
Creatinine, Ser: 1.16 mg/dL (ref 0.61–1.24)
GFR calc non Af Amer: >60 mL/min (ref >60)
Glucose, Bld: 174 mg/dL — ABNORMAL HIGH (ref 70–99)
Potassium: 3.9 mmol/L (ref 3.5–5.1)
SODIUM: 137 mmol/L (ref 135–145)
TOTAL PROTEIN: 7.1 g/dL (ref 6.5–8.1)

## 2018-06-19 LAB — TSH: TSH: 0.564 u[IU]/mL (ref 0.350–4.500)

## 2018-06-19 LAB — URINALYSIS, ROUTINE W REFLEX MICROSCOPIC
BILIRUBIN URINE: NEGATIVE
Bacteria, UA: NONE SEEN
GLUCOSE, UA: NEGATIVE mg/dL
KETONES UR: NEGATIVE mg/dL
Leukocytes, UA: NEGATIVE
Nitrite: NEGATIVE
PROTEIN: NEGATIVE mg/dL
Specific Gravity, Urine: 1.016 (ref 1.005–1.030)
pH: 6 (ref 5.0–8.0)

## 2018-06-19 LAB — TROPONIN I: Troponin I: <0.03 ng/mL (ref <0.03)

## 2018-06-19 LAB — BRAIN NATRIURETIC PEPTIDE: B Natriuretic Peptide: 362.7 pg/mL — ABNORMAL HIGH (ref 0.0–100.0)

## 2018-06-19 LAB — LIPASE, BLOOD: Lipase: 35 U/L (ref 11–51)

## 2018-06-19 LAB — APTT: aPTT: 36 seconds (ref 24–36)

## 2018-06-19 MED ORDER — ALUM & MAG HYDROXIDE-SIMETH 200-200-20 MG/5ML PO SUSP: 30.0000 mL | ORAL | Status: DC | PRN

## 2018-06-19 MED ORDER — NITROGLYCERIN 0.4 MG SL SUBL: 0.4000 mg | SUBLINGUAL_TABLET | SUBLINGUAL | Status: DC | PRN

## 2018-06-19 MED ORDER — WARFARIN - PHYSICIAN DOSING INPATIENT: Freq: Every day | Status: DC

## 2018-06-19 MED ORDER — WARFARIN SODIUM 5 MG PO TABS: 5.0000 mg | ORAL_TABLET | ORAL | Status: DC

## 2018-06-19 MED ORDER — FINASTERIDE 5 MG PO TABS
5.0000 mg | ORAL_TABLET | Freq: Every day | ORAL | Status: DC
  Administered 2018-06-19 – 2018-06-22 (×4): 5 mg via ORAL
  Filled 2018-06-19 (×4): qty 1

## 2018-06-19 MED ORDER — ROSUVASTATIN CALCIUM 10 MG PO TABS
20.0000 mg | ORAL_TABLET | Freq: Every day | ORAL | Status: DC
  Administered 2018-06-19 – 2018-06-21 (×3): 20 mg via ORAL
  Filled 2018-06-19 (×3): qty 2

## 2018-06-19 MED ORDER — SACUBITRIL-VALSARTAN 24-26 MG PO TABS
1.0000 | ORAL_TABLET | Freq: Two times a day (BID) | ORAL | Status: DC
  Administered 2018-06-19 – 2018-06-22 (×7): 1 via ORAL
  Filled 2018-06-19 (×7): qty 1

## 2018-06-19 MED ORDER — ASPIRIN EC 81 MG PO TBEC
81.0000 mg | DELAYED_RELEASE_TABLET | Freq: Every day | ORAL | Status: DC
  Administered 2018-06-19 – 2018-06-22 (×3): 81 mg via ORAL
  Filled 2018-06-19 (×4): qty 1

## 2018-06-19 MED ORDER — REGADENOSON 0.4 MG/5ML IV SOLN
INTRAVENOUS | Status: AC
  Administered 2018-06-19: 17:00:00
  Filled 2018-06-19: qty 5

## 2018-06-19 MED ORDER — REGADENOSON 0.4 MG/5ML IV SOLN
0.4000 mg | Freq: Once | INTRAVENOUS | Status: AC
  Filled 2018-06-19: qty 5

## 2018-06-19 MED ORDER — OMEGA-3-ACID ETHYL ESTERS 1 G PO CAPS
1.0000 g | ORAL_CAPSULE | Freq: Two times a day (BID) | ORAL | Status: DC
  Administered 2018-06-19 – 2018-06-22 (×6): 1 g via ORAL
  Filled 2018-06-19 (×7): qty 1

## 2018-06-19 MED ORDER — ONDANSETRON HCL 4 MG/2ML IJ SOLN: 4.0000 mg | Freq: Four times a day (QID) | INTRAMUSCULAR | Status: DC | PRN

## 2018-06-19 MED ORDER — TECHNETIUM TC 99M TETROFOSMIN IV KIT
30.0000 | PACK | Freq: Once | INTRAVENOUS | Status: AC | PRN
  Administered 2018-06-19: 30 via INTRAVENOUS

## 2018-06-19 MED ORDER — TECHNETIUM TC 99M TETROFOSMIN IV KIT
10.0000 | PACK | Freq: Once | INTRAVENOUS | Status: AC | PRN
  Administered 2018-06-19: 10 via INTRAVENOUS

## 2018-06-19 MED ORDER — BISOPROLOL FUMARATE 5 MG PO TABS
10.0000 mg | ORAL_TABLET | Freq: Every day | ORAL | Status: DC
  Administered 2018-06-19 – 2018-06-22 (×4): 10 mg via ORAL
  Filled 2018-06-19 (×4): qty 2

## 2018-06-19 MED ORDER — WARFARIN SODIUM 2.5 MG PO TABS: 2.5000 mg | ORAL_TABLET | ORAL | Status: DC

## 2018-06-19 MED ORDER — SPIRONOLACTONE 25 MG PO TABS
25.0000 mg | ORAL_TABLET | Freq: Every day | ORAL | Status: DC
  Administered 2018-06-19 – 2018-06-22 (×4): 25 mg via ORAL
  Filled 2018-06-19 (×4): qty 1

## 2018-06-19 MED ORDER — ACETAMINOPHEN 325 MG PO TABS: 650.0000 mg | ORAL_TABLET | ORAL | Status: DC | PRN

## 2018-06-19 NOTE — Progress Notes (Signed)
Pt seen for EP today.  He is well known to me. He has chronic asymptomatic sinus bradycardia (50s).  With his currently abdominal symptoms and bed rest, he has had rare bradycardia into the 40s, but mostly 50s.    His ICD is interrogated today and reveals normal SJM ICD function.  The ventricular pace, sense, and impedance values are stable.  No ICD shocks delivered.  V paces only 1%.  Histograms look ok.  He is programmed VVI 40 bpm.  No further workup or management of his bradycardia is required at this time.  OK to continue home dose of bisoprolol.  Electrophysiology team to see as needed while here. Please call with questions.  Thompson Grayer MD, [place] 06/19/2018

## 2018-06-19 NOTE — Progress Notes (Signed)
Pt arrived from [facility] to [phone] with wife. CHG bath given, CCMD notified. Pt's HR is in 22s. Cardiology at bedside. Pt is NPO at the time for further testing in am. Pt was educated to unit and use of call light. Pt is resting. Will continue to monitor.  Fransico Michael, RN

## 2018-06-19 NOTE — Progress Notes (Addendum)
Progress Note  Patient Name: [name] Date of Encounter: 06/19/2018  Primary Cardiologist: Rozann Lesches, MD  EP  Thompson Grayer, MD  Subjective   "No further pain though gassy feeling may be coming back."  Headed to xray   Inpatient Medications    Scheduled Meds: . aspirin EC  81 mg Oral Daily  . bisoprolol  10 mg Oral Daily  . finasteride  5 mg Oral Daily  . omega-3 acid ethyl esters  1 g Oral BID  . rosuvastatin  20 mg Oral q1800  . sacubitril-valsartan  1 tablet Oral BID  . spironolactone  25 mg Oral Daily  . [START ON 06/20/2018] warfarin  2.5 mg Oral Q M,W,F-1800  . warfarin  5 mg Oral Once per day on Sun Tue Thu Sat  . Warfarin - Physician Dosing Inpatient   Does not apply q1800   Continuous Infusions:  PRN Meds: acetaminophen, alum & mag hydroxide-simeth, nitroGLYCERIN, ondansetron (ZOFRAN) IV   Vital Signs    Vitals:   06/19/18 0240 06/19/18 0559  BP: 119/81 114/68  Pulse: (!) 45 (!) 45  Resp: 16 (!) 21  Temp: 98.2 F (36.8 C) 97.7 F (36.5 C)  TempSrc: Oral Oral  SpO2: 98% 97%  Weight: 103.7 kg   Height: 6\' 1"  (1.854 m)     Intake/Output Summary (Last 24 hours) at 06/19/2018 0807 Last data filed at 06/19/2018 0300 Gross per 24 hour  Intake 0 ml  Output -  Net 0 ml   Filed Weights   06/19/18 0240  Weight: 103.7 kg    Telemetry    Sinus brady to 39 mostly in 40s and occ to 93.  - Personally Reviewed  ECG    SB rate of 45, lower than in 04/2018 when rate 53.  T wave inversions V2-V6 more pronounced than prior - Personally Reviewed  Physical Exam   GEN: No acute distress.   Neck: No JVD Cardiac: RRR, no murmurs, rubs, or gallops.  Respiratory: Clear to auscultation bilaterally. GI: Soft, nontender, non-distended  MS: No edema; No deformity. Neuro:  Nonfocal  Psych: Normal affect   Labs    Chemistry Recent Labs  Lab 06/19/18 0325  NA 137  K 3.9  CL 104  CO2 27  GLUCOSE 174*  BUN 21  CREATININE 1.16  CALCIUM 8.9    PROT 7.1  ALBUMIN 3.8  AST 19  ALT 18  ALKPHOS 47  BILITOT 1.4*  GFRNONAA >60  GFRAA >60  ANIONGAP 6     HematologyNo results for input(s): WBC, RBC, HGB, HCT, MCV, MCH, MCHC, RDW, PLT in the last 168 hours.  Cardiac Enzymes Recent Labs  Lab 06/19/18 0325  TROPONINI <0.03   No results for input(s): TROPIPOC in the last 168 hours.   BNP Recent Labs  Lab 06/19/18 0325  BNP 362.7*     DDimer No results for input(s): DDIMER in the last 168 hours.   Radiology    No results found.  Cardiac Studies   05/22/18 TTE  Study Conclusions  - Left ventricle: The cavity size was normal. Wall thickness was   normal. Systolic function was moderately to severely reduced. The   estimated ejection fraction was in the range of 30% to 35%. The   study is not technically sufficient to allow evaluation of LV   diastolic function. - Regional wall motion abnormality: Akinesis of the mid   anteroseptal, apical septal, apical lateral, and apical   myocardium. - Aortic valve: Valve  area (VTI): 3.57 cm^2. Valve area (Vmax):   3.44 cm^2. Valve area (Vmean): 3.47 cm^2. - Left atrium: The atrium was severely dilated. - Technically adequate study.  01/09/17 TTE  Study Conclusions  - Left ventricle: The cavity size was normal. Wall thickness was   increased in a pattern of mild LVH. Systolic function was   severely reduced. The estimated ejection fraction was in the   range of 25% to 30%. There is akinesis of the   mid-apicalanteroseptal, anterior, and apical myocardium. There is   akinesis of the apicalinferior myocardium. No definite formed LV   mural thrombus noted. There is significant low flow and swirling   noted at the apex with Definity contrast. Doppler parameters are   consistent with abnormal left ventricular relaxation (grade 1   diastolic dysfunction). - Aortic valve: Trileaflet; mildly calcified leaflets. - Mitral valve: There was trivial regurgitation. - Left atrium:  The atrium was mildly to moderately dilated. - Right ventricle: Pacer wire or catheter noted in right ventricle. - Right atrium: The atrium was mildly dilated. - Tricuspid valve: There was trivial regurgitation. - Pulmonary arteries: PA peak pressure: 26 mm Hg (S). - Pericardium, extracardiac: There was no pericardial effusion.  Impressions:  - Mild LVH with LVEF 25-30%. Large region of mid to apical   anteroseptal, anterior, and inferior apical akinesis consistent   with ischemic cardiomyopathy. No definite formed LV mural   thrombus noted. There is significant low flow and swirling noted   at the apex with Definity contrast. Grade 1 diastolic   dysfunction. Trivial mitral regurgitation. Mild to moderate left   atrial enlargement. Mildly sclerotic aortic valve. Device wire   noted within the right heart. Trivial tricuspid regurgitation   with PASP 26 mmHg.  nuc study 04/26/16   There was no ST segment deviation noted during stress.  Findings consistent with large prior apical and anteroapical myocardial infarction. There is no significant peri-infarct ischemia.  The left ventricular ejection fraction is moderately decreased (30-44%).  This is an intermediate risk study. Intermediate risk based on decreased LVEF and prior infarct, no significant current myocardium at jeopardy.     Patient Profile     67 y.o. male with hx PAF, on coumadin, CAD with Ant MI '94, with PCI to LAD, and '98, CX BMS 06/26/08, drug eluting stent to the LAD and OM-1 in 2013), ischemic cardiomyopathy with ejection fraction of 30% status-post dual-chamber ICD (St. Jude), prior stroke, ventral now admitted with N&V, upper abd discomfort and diaphoresis, no SOB.  Hx of cholelithiasis, No ICD shocks.    Assessment & Plan    Upper abd pain with N&V neg troponins, BNP 362.  Last nuc in 2017 with prior MIs no ischemia and low EF that was known.   Last cath 2013 with patent stents. No further pain --hx  cholelithiasis --pt is NPO for possible nuc study --possible GI consult.  ICM with most recent echo EF now improved to 30-35%, on entresto, spironolactone and bisoprolol   Has ICD just evaluated 05/25/18 with [name]  Sinus brady with HR to 40 no a fib noted since admit   PAF maintaining SB on coumadin   BPH with trans rectal [place] stable continue finasteride.  Chronic systolic HF stable   For questions or updates, please contact [place] Please consult [email or web address] for contact info under     Signed, Cecilie Kicks, NP  06/19/2018, 8:07 AM    The patient was seen, examined and discussed with Mickel Baas  Dorene Ar, NP and I agree with the above.   67 y.o. male with history of atrial fibrillation on warfarin, coronary artery disease (with a history anterior MI '94 LAD PCI '94 and '98, CX PCI '98; CX BMS 06/26/08, drug eluting stent to the LAD and OM-1 in 2013), ischemic cardiomyopathy with ejection fraction of 30% status-post dual-chamber ICD (St. Jude), prior stroke, ventral hernia repair 10/2017, BPH who presented with atypical chest discomfort, right sided, possibly related to GI. Troponin is negative x 3, ECG shows new negative T waves in the anterolateral leads. We will plan for a stress test this am. Continue Entresto, ASA, bisoprolol, rosuvastatin.   Ena Dawley, MD 06/19/2018

## 2018-06-19 NOTE — H&P (Signed)
Cardiology History & Physical    Patient ID: [name] MRN: [id number], DOB: 17-Aug-1951 Date of Encounter: 06/19/2018, 2:45 AM Primary Physician: Rory Percy, MD Primary Cardiologist: Rozann Lesches, MD Primary Electrophysiologist:  Thompson Grayer, MD  Chief Complaint: Chest pain Reason for Admission: Rule out ACS Requesting OV:[facility]CHYIFOY [place]  HPI: [name] is a 67 y.o. male with history of paroxysmal atrial fibrillation on warfarin, coronary artery disease (with a history anterior MI '94 LAD PCI '94 and '98, CX PCI '98; CX BMS 06/26/08, drug eluting stent to the LAD and OM-1 in 2013), ischemic cardiomyopathy with ejection fraction of 30% status-post dual-chamber ICD (St. Jude), prior stroke, ventral hernia repair 10/2017, BPH who presents with chest discomfort. He developed symptoms at approximately 4:30 pm yesterday while sitting first. He began to feel gassy, bloated, nauseated and vomited several times with upper abdominal discomfort. This was accompanied by diaphoresis. He had a sharp pain in his right chest area and the middle of his back. His symptoms were persistent at the time of his arrival to the [facility] Emergency Department. He underwent a radiograph of the chest with no acute thoracic process and his initial troponin was negative. He denies having had any shortness of breath. His symptoms improved with Maalox. No fevers. He did have chills. No known sick contacts. He at [employer] for lunch yesterday at 11:30 pm and ate a Cajun chicken filet sandwich which is not a usual lunch for him. He also has history of cholelithiasis.  No ICD shocks. No pre-syncope, syncope. He has had more frequent bowel movements recently over the past few weeks.   Past Medical History:  Diagnosis Date  . A-V fistula ([place])   . AICD (automatic cardioverter/defibrillator) present 2006   ICD  . Anterior myocardial infarction ([place])   . Chronic systolic heart failure ([place])   . Coronary  atherosclerosis of native coronary artery    a. s/p BMS to LAD and OM1 b. patent stents by cath in 2013  . History of hiatal hernia   . Hypertension   . 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 Ischemic cardiomyopathy    LVEF 30%, status post ICD  . Paroxysmal atrial fibrillation ([facility])    On coumadin  . Pseudoaneurysm ([place])   . Sleep apnea    cpap  . Stroke [facility])      Surgical History:  Past Surgical History:  Procedure Laterality Date  . AICD implantation  2011   St Jude ICD implanted for primary prevention of sudden death  . CATARACT EXTRACTION W/PHACO Left 03/27/2017   Procedure: CATARACT EXTRACTION PHACO AND INTRAOCULAR LENS PLACEMENT (IOC);  Surgeon: [name], MD;  Location: [facility];  Service: Ophthalmology;  Laterality: Left;  CDE: 8.48  . CATARACT EXTRACTION W/PHACO Right 05/29/2017   Procedure: CATARACT EXTRACTION PHACO AND INTRAOCULAR LENS PLACEMENT (IOC);  Surgeon: [name], MD;  Location: [facility];  Service: Ophthalmology;  Laterality: Right;  CDE: 7.48  . EYE SURGERY    . ICD GENERATOR CHANGEOUT N/A 01/19/2017   SJM [place] VR ICD generator change by Dr [name]  . LAPAROSCOPY N/A 10/23/2017   Procedure: LAPAROSCOPY DIAGNOSTIC;  Surgeon: [name], MD;  Location: [place];  Service: General;  Laterality: N/A;  . VENTRAL HERNIA REPAIR N/A 10/23/2017   Procedure: [place];  Surgeon: [name], MD;  Location: [place];  Service: General;  Laterality: N/A;     Home Meds: Prior to Admission medications   Medication Sig Start  Date End Date Taking? Authorizing Provider  aspirin 81 MG tablet Take 81 mg by mouth daily.      [provider]  bisoprolol (ZEBETA) 5 MG tablet TAKE 1 TABLET (5 MG TOTAL) BY MOUTH DAILY. 03/14/18   Satira Sark, MD  calcium carbonate (TUMS - DOSED IN MG ELEMENTAL CALCIUM) 500 MG chewable tablet Chew 1-2 tablets by mouth daily as needed for indigestion or heartburn.    [provider]  ENTRESTO 24-26 MG  TAKE 1 TABLET BY MOUTH TWICE A DAY 02/19/18   Satira Sark, MD  finasteride (PROSCAR) 5 MG tablet Take 5 mg by mouth Daily.  11/28/10   [provider]  nitroGLYCERIN (NITROSTAT) 0.4 MG SL tablet Place 1 tablet (0.4 mg total) under the tongue every 5 (five) minutes as needed for chest pain. 05/08/18   Satira Sark, MD  omega-3 acid ethyl esters (LOVAZA) 1 G capsule Take 1 g by mouth 2 (two) times daily.    [provider]  rosuvastatin (CRESTOR) 20 MG tablet TAKE 1 TABLET (20 MG TOTAL) BY MOUTH DAILY. 04/16/18   Satira Sark, MD  spironolactone (ALDACTONE) 25 MG tablet TAKE 1 TABLET BY MOUTH DAILY 02/06/18   Satira Sark, MD  warfarin (COUMADIN) 5 MG tablet TAKE 1 TABLET BY MOUTH DAILY EXCEPT 1/2 A TABLET ON MONDAYS AND FRIDAYS 10/30/17   Satira Sark, MD    Allergies: No Known Allergies  Social History   Socioeconomic History  . Marital status: Married    Spouse name: Not on file  . Number of children: Not on file  . Years of education: Not on file  . Highest education level: Not on file  Occupational History  . Not on file  Social Needs  . Financial resource strain: Not on file  . Food insecurity:    Worry: Not on file    Inability: Not on file  . Transportation needs:    Medical: Not on file    Non-medical: Not on file  Tobacco Use  . Smoking status: Never Smoker  . Smokeless tobacco: Never Used  Substance and Sexual Activity  . Alcohol use: Yes    Alcohol/week: 0.0 standard drinks    Comment: occasional  . Drug use: No  . Sexual activity: Not on file  Lifestyle  . Physical activity:    Days per week: Not on file    Minutes per session: Not on file  . Stress: Not on file  Relationships  . Social connections:    Talks on phone: Not on file    Gets together: Not on file    Attends religious service: Not on file    Active member of club or organization: Not on file    Attends meetings of clubs or organizations: Not on file     Relationship status: Not on file  . Intimate partner violence:    Fear of current or ex partner: Not on file    Emotionally abused: Not on file    Physically abused: Not on file    Forced sexual activity: Not on file  Other Topics Concern  . Not on file  Social History Narrative  . Not on file     Family History  Problem Relation Age of Onset  . Cancer Mother   . Heart attack Father   . Heart disease Brother     Review of Systems: Review of Systems  Constitutional: Positive for chills and malaise/fatigue. Negative for fever.  HENT: Negative for congestion and nosebleeds.   Eyes: Negative for blurred vision and double vision.  Respiratory: Negative for cough, hemoptysis, sputum production, shortness of breath and wheezing.   Cardiovascular: Positive for chest pain. Negative for palpitations, orthopnea, claudication, leg swelling and PND.  Gastrointestinal: Positive for abdominal pain, diarrhea, nausea and vomiting. Negative for blood in stool, constipation and melena.  Genitourinary: Negative for dysuria, frequency and urgency.  Musculoskeletal: Positive for back pain. Negative for myalgias and neck pain.  Skin: Negative for itching and rash.  Neurological: Negative for dizziness, sensory change, speech change, focal weakness, seizures and loss of consciousness.  Endo/Heme/Allergies: Does not bruise/bleed easily.     Labs:   Lab Results  Component Value Date   WBC 7.9 10/19/2017   HGB 15.3 10/19/2017   HCT 46.3 10/19/2017   MCV 95.9 10/19/2017   PLT 156 10/19/2017   No results for input(s): NA, K, CL, CO2, BUN, CREATININE, CALCIUM, PROT, BILITOT, ALKPHOS, ALT, AST, GLUCOSE in the last 168 hours.  Invalid input(s): LABALBU No results for input(s): CKTOTAL, CKMB, TROPONINI in the last 72 hours. No results found for: CHOL, HDL, LDLCALC, TRIG No results found for: DDIMER  Radiology/Studies:  [place] Transrectal Complete  Result Date: 06/07/2018 CLINICAL DATA:  Ultrasound  was provided for use by the ordering physician, and a technical charge was applied by the performing facility.  No radiologist interpretation/professional services rendered.   Wt Readings from Last 3 Encounters:  06/19/18 103.7 kg  05/25/18 103.4 kg  05/08/18 103.4 kg    EKG: NSR, precordial TWI across V2-V6 personal review)  Physical Exam: Blood pressure 119/81, pulse (!) 45, temperature 98.2 F (36.8 C), temperature source Oral, resp. rate 16, height 6\' 1"  (1.854 m), weight 103.7 kg, SpO2 98 %. Body mass index is 30.16 kg/m. General: Well developed, well nourished, in no acute distress. Head: Normocephalic, atraumatic, sclera non-icteric, no xanthomas, nares are without discharge.  Neck: Negative for carotid bruits. JVD not elevated. Lungs: Clear bilaterally to auscultation without wheezes, rales, or rhonchi. Breathing is unlabored. Heart: Bradycardic regular with S1 S2. No murmurs, rubs, or gallops appreciated. ICD generator clean dry intact.  Abdomen: Soft, non-tender, non-distended with normoactive bowel sounds. No hepatomegaly. No rebound/guarding. No obvious abdominal masses. Msk:  Strength and tone appear normal for age. Extremities: No clubbing or cyanosis. No edema.  Distal pedal pulses are 2+ and equal bilaterally. Neuro: Alert and oriented X 3. No focal deficit. No facial asymmetry. Moves all extremities spontaneously. Psych:  Responds to questions appropriately with a normal affect.    Assessment and Plan  [name] is a 67 y.o. male with history of atrial fibrillation on warfarin, coronary artery disease (with a history anterior MI '94 LAD PCI '94 and '98, CX PCI '98; CX BMS 06/26/08, drug eluting stent to the LAD and OM-1 in 2013), ischemic cardiomyopathy with ejection fraction of 30% status-post dual-chamber ICD (St. Jude), prior stroke, ventral hernia repair 10/2017, BPH who presents with chest discomfort.  1. Chest discomfort. He describes atypical symptoms for  acute coronary syndrome including right sided chest discomfort and abdominal discomfort. Overall his symptoms seem to point to a gastrointestinal etiology however he does state that similar symptoms occurred during prior ACS events. We will rule him out for myocardial infarction and following this consider non-invasive risk stratification (SPECT). He is currently pain free. If abdominal pain worsens could consider mesenteric ischemia, diverticulitis, or other acute intra-abdominal processes. - NPO - CBC, BMP, Hepatic Function Panel, Lipase -  Abdominal x-ray - Serial troponin testing - UA/UCx - Maalox prn - Consider SPECT  2. Coronary artery disease - Continue aspirin 81 - Continue rosuvastatin 20  3. Ischemic cardiomyopathy - Continue entresto 24-26 - Continue spironolactone 25  - Continue bisoprolol 5  4. Atrial fibrillation. Currently in normal sinus rhythm.  - Continue warfarin 2.5 mg MWF and 5 mg all other days - Continue bisoprolol  5. BPH - Continue finasteride   Severity of Illness: The appropriate patient status for this patient is OBSERVATION. Observation status is judged to be reasonable and necessary in order to provide the required intensity of service to ensure the patient's safety. The patient's presenting symptoms, physical exam findings, and initial radiographic and laboratory data in the context of their medical condition is felt to place them at decreased risk for further clinical deterioration. Furthermore, it is anticipated that the patient will be medically stable for discharge from the hospital within 2 midnights of admission. The following factors support the patient status of observation.   " The patient's presenting symptoms include chest and abdominal pain. " The physical exam findings include bradycardia. " The initial radiographic and laboratory data are negative initial troponin and chest x-ray.     For questions or updates, please contact [place] Please consult [email or web address] for contact info under Cardiology/STEMI.  Signed, Perley Jain, MD 06/19/2018, 2:45 AM

## 2018-06-19 NOTE — Progress Notes (Signed)
   [name] presented for a nuclear stress test today.  No immediate complications.  Stress imaging is pending at this time.  Preliminary EKG findings may be listed in the chart, but the stress test result will not be finalized until perfusion imaging is complete.    Kathyrn Drown, NP-C 06/19/2018, 12:45 PM

## 2018-06-19 NOTE — Progress Notes (Signed)
The stress test is abnormal, we will plan for a cardiac catheterization once INR < 2, we will hold coumadin now. Follow INR daily, anticipated cath on Thursday.  Ena Dawley, MD 06/19/2018

## 2018-06-20 ENCOUNTER — Encounter (HOSPITAL_COMMUNITY): Payer: Self-pay

## 2018-06-20 DIAGNOSIS — I25119 Atherosclerotic heart disease of native coronary artery with unspecified angina pectoris: Secondary | ICD-10-CM | POA: Diagnosis present

## 2018-06-20 DIAGNOSIS — N400 Enlarged prostate without lower urinary tract symptoms: Secondary | ICD-10-CM | POA: Diagnosis present

## 2018-06-20 DIAGNOSIS — E782 Mixed hyperlipidemia: Secondary | ICD-10-CM | POA: Diagnosis present

## 2018-06-20 DIAGNOSIS — Z79899 Other long term (current) drug therapy: Secondary | ICD-10-CM | POA: Diagnosis not present

## 2018-06-20 DIAGNOSIS — I358 Other nonrheumatic aortic valve disorders: Secondary | ICD-10-CM | POA: Diagnosis present

## 2018-06-20 DIAGNOSIS — R079 Chest pain, unspecified: Secondary | ICD-10-CM | POA: Diagnosis present

## 2018-06-20 DIAGNOSIS — I208 Other forms of angina pectoris: Secondary | ICD-10-CM | POA: Diagnosis not present

## 2018-06-20 DIAGNOSIS — I110 Hypertensive heart disease with heart failure: Secondary | ICD-10-CM | POA: Diagnosis present

## 2018-06-20 DIAGNOSIS — I25110 Atherosclerotic heart disease of native coronary artery with unstable angina pectoris: Secondary | ICD-10-CM | POA: Diagnosis not present

## 2018-06-20 DIAGNOSIS — Z7982 Long term (current) use of aspirin: Secondary | ICD-10-CM | POA: Diagnosis not present

## 2018-06-20 DIAGNOSIS — I5022 Chronic systolic (congestive) heart failure: Secondary | ICD-10-CM | POA: Diagnosis present

## 2018-06-20 DIAGNOSIS — Z9841 Cataract extraction status, right eye: Secondary | ICD-10-CM | POA: Diagnosis not present

## 2018-06-20 DIAGNOSIS — Z9842 Cataract extraction status, left eye: Secondary | ICD-10-CM | POA: Diagnosis not present

## 2018-06-20 DIAGNOSIS — Z955 Presence of coronary angioplasty implant and graft: Secondary | ICD-10-CM | POA: Diagnosis not present

## 2018-06-20 DIAGNOSIS — R9439 Abnormal result of other cardiovascular function study: Secondary | ICD-10-CM

## 2018-06-20 DIAGNOSIS — I255 Ischemic cardiomyopathy: Secondary | ICD-10-CM | POA: Diagnosis present

## 2018-06-20 DIAGNOSIS — I252 Old myocardial infarction: Secondary | ICD-10-CM | POA: Diagnosis not present

## 2018-06-20 DIAGNOSIS — Z961 Presence of intraocular lens: Secondary | ICD-10-CM | POA: Diagnosis present

## 2018-06-20 DIAGNOSIS — I200 Unstable angina: Secondary | ICD-10-CM | POA: Diagnosis not present

## 2018-06-20 DIAGNOSIS — Z8249 Family history of ischemic heart disease and other diseases of the circulatory system: Secondary | ICD-10-CM | POA: Diagnosis not present

## 2018-06-20 DIAGNOSIS — R001 Bradycardia, unspecified: Secondary | ICD-10-CM | POA: Diagnosis present

## 2018-06-20 DIAGNOSIS — Z8673 Personal history of transient ischemic attack (TIA), and cerebral infarction without residual deficits: Secondary | ICD-10-CM | POA: Diagnosis not present

## 2018-06-20 DIAGNOSIS — Z7901 Long term (current) use of anticoagulants: Secondary | ICD-10-CM | POA: Diagnosis not present

## 2018-06-20 DIAGNOSIS — G4730 Sleep apnea, unspecified: Secondary | ICD-10-CM | POA: Diagnosis present

## 2018-06-20 DIAGNOSIS — Z95810 Presence of automatic (implantable) cardiac defibrillator: Secondary | ICD-10-CM | POA: Diagnosis not present

## 2018-06-20 DIAGNOSIS — I480 Paroxysmal atrial fibrillation: Secondary | ICD-10-CM | POA: Diagnosis present

## 2018-06-20 LAB — CBC WITH DIFFERENTIAL/PLATELET
Abs Immature Granulocytes: 0 10*3/uL (ref 0.0–0.1)
BASOS ABS: 0.1 10*3/uL (ref 0.0–0.1)
BASOS PCT: 1 %
EOS ABS: 0.1 10*3/uL (ref 0.0–0.7)
EOS PCT: 1 %
HCT: 46.2 % (ref 39.0–52.0)
Hemoglobin: 15 g/dL (ref 13.0–17.0)
Immature Granulocytes: 0 %
LYMPHS PCT: 16 %
Lymphs Abs: 1.8 10*3/uL (ref 0.7–4.0)
MCH: 31.3 pg (ref 26.0–34.0)
MCHC: 32.5 g/dL (ref 30.0–36.0)
MCV: 96.5 fL (ref 78.0–100.0)
MONO ABS: 1.1 10*3/uL — AB (ref 0.1–1.0)
Monocytes Relative: 10 %
Neutro Abs: 7.9 10*3/uL — ABNORMAL HIGH (ref 1.7–7.7)
Neutrophils Relative %: 72 %
PLATELETS: 131 10*3/uL — AB (ref 150–400)
RBC: 4.79 MIL/uL (ref 4.22–5.81)
RDW: 13.3 % (ref 11.5–15.5)
WBC: 11 10*3/uL — AB (ref 4.0–10.5)

## 2018-06-20 LAB — PROTIME-INR
INR: 2.17
PROTHROMBIN TIME: 24 s — AB (ref 11.4–15.2)

## 2018-06-20 LAB — HIV ANTIBODY (ROUTINE TESTING W REFLEX): HIV Screen 4th Generation wRfx: NONREACTIVE

## 2018-06-20 MED ORDER — ZOLPIDEM TARTRATE 5 MG PO TABS
5.0000 mg | ORAL_TABLET | Freq: Every evening | ORAL | Status: DC | PRN
  Administered 2018-06-20 (×2): 5 mg via ORAL
  Filled 2018-06-20 (×2): qty 1

## 2018-06-20 NOTE — Plan of Care (Signed)

## 2018-06-20 NOTE — Progress Notes (Addendum)
Progress Note  Patient Name: [name] Date of Encounter: 06/20/2018  Primary Cardiologist: Rozann Lesches, MD  EP  Thompson Grayer, MD  Subjective   No more CP, no palpitations, unaware of short runs VT, no presyncope  Inpatient Medications    Scheduled Meds: . aspirin EC  81 mg Oral Daily  . bisoprolol  10 mg Oral Daily  . finasteride  5 mg Oral Daily  . omega-3 acid ethyl esters  1 g Oral BID  . rosuvastatin  20 mg Oral q1800  . sacubitril-valsartan  1 tablet Oral BID  . spironolactone  25 mg Oral Daily  . Warfarin - Physician Dosing Inpatient   Does not apply q1800   Continuous Infusions:  PRN Meds: acetaminophen, alum & mag hydroxide-simeth, nitroGLYCERIN, ondansetron (ZOFRAN) IV, zolpidem   Vital Signs    Vitals:   06/19/18 2358 06/20/18 0302 06/20/18 0323 06/20/18 0737  BP: (!) 106/44  (!) 91/57 105/64  Pulse: (!) 46  (!) 49 (!) 53  Resp: 17  20 (!) 22  Temp: 98.6 F (37 C)  98.7 F (37.1 C) 98.6 F (37 C)  TempSrc: Oral  Oral Oral  SpO2: 99%  95% 95%  Weight:  103.2 kg    Height:        Intake/Output Summary (Last 24 hours) at 06/20/2018 0817 Last data filed at 06/19/2018 2200 Gross per 24 hour  Intake 600 ml  Output 225 ml  Net 375 ml   Filed Weights   06/19/18 0240 06/20/18 0302  Weight: 103.7 kg 103.2 kg    Telemetry    SR, Sinus brady, occ paced beats, PVCs and pairs, a 7 bt run VT  - Personally Reviewed  ECG    10/01, Sinus brady, HR 45, T wave inversions lat leads are more than previous - Personally Reviewed  Physical Exam   General: Well developed, well nourished, male in no acute distress Head: Eyes PERRLA, No xanthomas.   Normocephalic and atraumatic  Lungs: Clear bilaterally to auscultation. Heart: HRRR S1 S2, without MRG.  Pulses are 2+ & equal.  No JVD. Abdomen: Bowel sounds are present, abdomen soft and non-tender without masses or  hernias noted. Msk: Normal strength and tone for age. Extremities: No clubbing,  cyanosis or edema.    Skin:  No rashes or lesions noted. Neuro: Alert and oriented X 3. Psych:  Kermit Balo affect, responds appropriately  Labs    Chemistry Recent Labs  Lab 06/19/18 0325  NA 137  K 3.9  CL 104  CO2 27  GLUCOSE 174*  BUN 21  CREATININE 1.16  CALCIUM 8.9  PROT 7.1  ALBUMIN 3.8  AST 19  ALT 18  ALKPHOS 47  BILITOT 1.4*  GFRNONAA >60  GFRAA >60  ANIONGAP 6     Hematology Lab Results  Component Value Date   WBC 7.9 10/19/2017   HGB 15.3 10/19/2017   HCT 46.3 10/19/2017   MCV 95.9 10/19/2017   PLT 156 10/19/2017     Cardiac Enzymes Recent Labs  Lab 06/19/18 0325 06/19/18 0846 06/19/18 1453  TROPONINI <0.03 <0.03 <0.03      BNP Recent Labs  Lab 06/19/18 0325  BNP 362.7*    Lab Results  Component Value Date   INR 2.17 06/20/2018   INR 2.11 06/19/2018   INR 3.4 (A) 06/12/2018     Radiology    Nm Myocar Multi W/spect W/wall Motion / Ef  Result Date: 06/19/2018  There was no ST segment deviation  noted during stress.  No T wave inversion was noted during stress.  Defect 1: There is a large defect of severe severity present in the mid anterior, mid anteroseptal, apical anterior, apical septal, apical inferior, apical lateral and apex location.  Defect 2: There is a medium defect of moderate severity present in the mid inferior location.  This is a high risk study.  Findings consistent with prior myocardial infarction with peri-infarct ischemia.  The left ventricular ejection fraction is moderately decreased (30-44%).  Abnormal, high risk stress nuclear study with large prior anteroseptal and apical infarct; prior inferior infarct with mild peri-infarct ischemia.  Gated ejection fraction 32% with akinesis of the anteroseptal wall and apex.  Severe left ventricular enlargement.  Study interpreted as high risk due to reduced LV function.   Dg Abd 2 Views  Result Date: 06/19/2018 CLINICAL DATA:  Abdominal pain. EXAM: ABDOMEN - 2 VIEW  COMPARISON:  CT abdomen and pelvis 07/11/2017 FINDINGS: No intraperitoneal free air is identified. Scattered gas is noted in nondilated loops of bowel without evidence of obstruction. Small calcifications in the pelvis likely represent phleboliths. Asymmetrically advanced left hip osteoarthrosis is noted. IMPRESSION: Nonobstructed bowel gas pattern. Electronically Signed   By: Logan Bores M.D.   On: 06/19/2018 09:12    Cardiac Studies   05/22/18 TTE  Study Conclusions  - Left ventricle: The cavity size was normal. Wall thickness was   normal. Systolic function was moderately to severely reduced. The   estimated ejection fraction was in the range of 30% to 35%. The   study is not technically sufficient to allow evaluation of LV   diastolic function. - Regional wall motion abnormality: Akinesis of the mid   anteroseptal, apical septal, apical lateral, and apical   myocardium. - Aortic valve: Valve area (VTI): 3.57 cm^2. Valve area (Vmax):   3.44 cm^2. Valve area (Vmean): 3.47 cm^2. - Left atrium: The atrium was severely dilated. - Technically adequate study.  01/09/17 TTE  Study Conclusions  - Left ventricle: The cavity size was normal. Wall thickness was   increased in a pattern of mild LVH. Systolic function was   severely reduced. The estimated ejection fraction was in the   range of 25% to 30%. There is akinesis of the   mid-apicalanteroseptal, anterior, and apical myocardium. There is   akinesis of the apicalinferior myocardium. No definite formed LV   mural thrombus noted. There is significant low flow and swirling   noted at the apex with Definity contrast. Doppler parameters are   consistent with abnormal left ventricular relaxation (grade 1   diastolic dysfunction). - Aortic valve: Trileaflet; mildly calcified leaflets. - Mitral valve: There was trivial regurgitation. - Left atrium: The atrium was mildly to moderately dilated. - Right ventricle: Pacer wire or catheter  noted in right ventricle. - Right atrium: The atrium was mildly dilated. - Tricuspid valve: There was trivial regurgitation. - Pulmonary arteries: PA peak pressure: 26 mm Hg (S). - Pericardium, extracardiac: There was no pericardial effusion.  Impressions:  - Mild LVH with LVEF 25-30%. Large region of mid to apical   anteroseptal, anterior, and inferior apical akinesis consistent   with ischemic cardiomyopathy. No definite formed LV mural   thrombus noted. There is significant low flow and swirling noted   at the apex with Definity contrast. Grade 1 diastolic   dysfunction. Trivial mitral regurgitation. Mild to moderate left   atrial enlargement. Mildly sclerotic aortic valve. Device wire   noted within the right heart. Trivial  tricuspid regurgitation   with PASP 26 mmHg.  nuc study 04/26/16   There was no ST segment deviation noted during stress.  Findings consistent with large prior apical and anteroapical myocardial infarction. There is no significant peri-infarct ischemia.  The left ventricular ejection fraction is moderately decreased (30-44%).  This is an intermediate risk study. Intermediate risk based on decreased LVEF and prior infarct, no significant current myocardium at jeopardy.     Patient Profile     67 y.o. male with hx PAF, on coumadin, CAD with Ant MI '94, with PCI to LAD, and '98, CX BMS 06/26/08, drug eluting stent to the LAD and OM-1 in 2013), ischemic cardiomyopathy with ejection fraction of 30% status-post dual-chamber ICD (St. Jude), prior stroke, ventral now admitted with N&V, upper abd discomfort and diaphoresis, no SOB.  Hx of cholelithiasis, No ICD shocks.    Assessment & Plan    Upper abd pain  - no gallstones or other obstruction on abd CT - neg ez but MV abnl, results above - The risks and benefits of a cardiac catheterization including, but not limited to, death, stroke, MI, kidney damage and bleeding were discussed with the patient who  indicates understanding and agrees to proceed.  - he prefers a definitive answer - on for Dr Ellyn Hack at noon 10/03  ICM  - EF improved some from lowest values - continue bisoprolol, Entresto and spiro - volume good by exam  Has ICD  - Dr [name] reviewed, no changes   Sinus brady  - no Afib - NSVT x 7 bts, no sx - occ vent ectopy - continue BB  PAF  - none since admit - INR trending down   BPH  - continue Proscar  Chronic systolic HF  - wt and volume status stable   For questions or updates, please contact [facility] Please consult [email or web address] for contact info under     Signed, Rosaria Ferries, PA-C  06/20/2018, 8:17 AM    The patient was seen, examined and discussed with Rosaria Ferries, PA-C and I agree with the above.   The stress test is abnormal, we will plan for a cardiac catheterization once INR < 2, tentatively for tomorrow, we will hold coumadin (the last dose on 9/30). Follow INR daily, today 2.1. He is having asymptomatic bradycardia and short runs of nsVT, ICD was interrogated yesterday.   Ena Dawley, MD 06/20/2018

## 2018-06-21 ENCOUNTER — Inpatient Hospital Stay (HOSPITAL_COMMUNITY)
Admission: EM | Disposition: A | Discharge status: Home / Self Care | Payer: Self-pay | Source: Other Acute Inpatient Hospital | Attending: Cardiology

## 2018-06-21 DIAGNOSIS — R9439 Abnormal result of other cardiovascular function study: Secondary | ICD-10-CM

## 2018-06-21 DIAGNOSIS — I208 Other forms of angina pectoris: Secondary | ICD-10-CM

## 2018-06-21 DIAGNOSIS — I480 Paroxysmal atrial fibrillation: Secondary | ICD-10-CM

## 2018-06-21 DIAGNOSIS — I5022 Chronic systolic (congestive) heart failure: Secondary | ICD-10-CM

## 2018-06-21 HISTORY — PX: LEFT HEART CATH AND CORONARY ANGIOGRAPHY: CATH118249

## 2018-06-21 LAB — PROTIME-INR
INR: 1.69
Prothrombin Time: 19.7 seconds — ABNORMAL HIGH (ref 11.4–15.2)

## 2018-06-21 SURGERY — LEFT HEART CATH AND CORONARY ANGIOGRAPHY
Anesthesia: LOCAL

## 2018-06-21 MED ORDER — SODIUM CHLORIDE 0.9% FLUSH
3.0000 mL | Freq: Two times a day (BID) | INTRAVENOUS | Status: DC
  Administered 2018-06-22: 3 mL via INTRAVENOUS

## 2018-06-21 MED ORDER — HEPARIN (PORCINE) IN NACL 1000-0.9 UT/500ML-% IV SOLN
INTRAVENOUS | Status: AC
  Filled 2018-06-21: qty 1000

## 2018-06-21 MED ORDER — HEPARIN SODIUM (PORCINE) 1000 UNIT/ML IJ SOLN
INTRAMUSCULAR | Status: DC | PRN
  Administered 2018-06-21: 5000 [IU] via INTRAVENOUS

## 2018-06-21 MED ORDER — MIDAZOLAM HCL 2 MG/2ML IJ SOLN
INTRAMUSCULAR | Status: AC
  Filled 2018-06-21: qty 2

## 2018-06-21 MED ORDER — HEPARIN (PORCINE) IN NACL 100-0.45 UNIT/ML-% IJ SOLN
1250.0000 [IU]/h | INTRAMUSCULAR | Status: DC
  Administered 2018-06-21: 1250 [IU]/h via INTRAVENOUS
  Filled 2018-06-21: qty 250

## 2018-06-21 MED ORDER — SODIUM CHLORIDE 0.9 % IV SOLN: 250.0000 mL | INTRAVENOUS | Status: DC | PRN

## 2018-06-21 MED ORDER — LIDOCAINE HCL (PF) 1 % IJ SOLN
INTRAMUSCULAR | Status: DC | PRN
  Administered 2018-06-21: 5 mL

## 2018-06-21 MED ORDER — VERAPAMIL HCL 2.5 MG/ML IV SOLN
INTRAVENOUS | Status: AC
  Filled 2018-06-21: qty 2

## 2018-06-21 MED ORDER — ASPIRIN 81 MG PO CHEW
81.0000 mg | CHEWABLE_TABLET | ORAL | Status: AC
  Administered 2018-06-21: 81 mg via ORAL
  Filled 2018-06-21: qty 1

## 2018-06-21 MED ORDER — MIDAZOLAM HCL 2 MG/2ML IJ SOLN
INTRAMUSCULAR | Status: DC | PRN
  Administered 2018-06-21: 1 mg via INTRAVENOUS

## 2018-06-21 MED ORDER — SODIUM CHLORIDE 0.9% FLUSH
3.0000 mL | Freq: Two times a day (BID) | INTRAVENOUS | Status: DC
  Administered 2018-06-21: 3 mL via INTRAVENOUS

## 2018-06-21 MED ORDER — SODIUM CHLORIDE 0.9% FLUSH: 3.0000 mL | INTRAVENOUS | Status: DC | PRN

## 2018-06-21 MED ORDER — IOHEXOL 350 MG/ML SOLN
INTRAVENOUS | Status: DC | PRN
  Administered 2018-06-21: 75 mL via INTRA_ARTERIAL

## 2018-06-21 MED ORDER — FENTANYL CITRATE (PF) 100 MCG/2ML IJ SOLN
INTRAMUSCULAR | Status: AC
  Filled 2018-06-21: qty 2

## 2018-06-21 MED ORDER — HEPARIN SODIUM (PORCINE) 1000 UNIT/ML IJ SOLN
INTRAMUSCULAR | Status: AC
  Filled 2018-06-21: qty 1

## 2018-06-21 MED ORDER — ASPIRIN 81 MG PO CHEW: 81.0000 mg | CHEWABLE_TABLET | ORAL | Status: DC

## 2018-06-21 MED ORDER — SODIUM CHLORIDE 0.9 % IV SOLN
INTRAVENOUS | Status: AC
  Administered 2018-06-21: 20:00:00 via INTRAVENOUS

## 2018-06-21 MED ORDER — VERAPAMIL HCL 2.5 MG/ML IV SOLN
INTRAVENOUS | Status: DC | PRN
  Administered 2018-06-21: 10 mL via INTRA_ARTERIAL

## 2018-06-21 MED ORDER — SODIUM CHLORIDE 0.9 % IV SOLN
INTRAVENOUS | Status: DC
  Administered 2018-06-21: 06:00:00 via INTRAVENOUS

## 2018-06-21 MED ORDER — LIDOCAINE HCL (PF) 1 % IJ SOLN
INTRAMUSCULAR | Status: AC
  Filled 2018-06-21: qty 30

## 2018-06-21 MED ORDER — FENTANYL CITRATE (PF) 100 MCG/2ML IJ SOLN
INTRAMUSCULAR | Status: DC | PRN
  Administered 2018-06-21: 25 ug via INTRAVENOUS

## 2018-06-21 SURGICAL SUPPLY — 13 items
CATH INFINITI 5FR ANG PIGTAIL (CATHETERS) ×2 IMPLANT
CATH INFINITI 5FR JL4 (CATHETERS) ×2 IMPLANT
CATH INFINITI JR4 5F (CATHETERS) ×2 IMPLANT
CATH LAUNCHER 5F 3DRIGHT (CATHETERS) ×1 IMPLANT
CATHETER LAUNCHER 5F 3DRIGHT (CATHETERS) ×2
DEVICE RAD COMP TR BAND LRG (VASCULAR PRODUCTS) ×2 IMPLANT
GLIDESHEATH SLEND A-KIT 6F 22G (SHEATH) ×2 IMPLANT
GUIDEWIRE INQWIRE 1.5J.035X260 (WIRE) ×1 IMPLANT
INQWIRE 1.5J .035X260CM (WIRE) ×2
KIT HEART LEFT (KITS) ×2 IMPLANT
PACK CARDIAC CATHETERIZATION (CUSTOM PROCEDURE TRAY) ×2 IMPLANT
TRANSDUCER W/STOPCOCK (MISCELLANEOUS) ×2 IMPLANT
TUBING CIL FLEX 10 FLL-RA (TUBING) ×2 IMPLANT

## 2018-06-21 NOTE — H&P (View-Only) (Signed)
Progress Note  Patient Name: [name] Date of Encounter: 06/21/2018  Primary Cardiologist: Rozann Lesches, MD  EP  Thompson Grayer, MD  Subjective   No more CP, no palpitations, unaware of short runs VT, no presyncope  Inpatient Medications    Scheduled Meds: . aspirin EC  81 mg Oral Daily  . bisoprolol  10 mg Oral Daily  . finasteride  5 mg Oral Daily  . omega-3 acid ethyl esters  1 g Oral BID  . rosuvastatin  20 mg Oral q1800  . sacubitril-valsartan  1 tablet Oral BID  . sodium chloride flush  3 mL Intravenous Q12H  . spironolactone  25 mg Oral Daily   Continuous Infusions: . sodium chloride    . sodium chloride 75 mL/hr at 06/21/18 0611   PRN Meds: sodium chloride, acetaminophen, alum & mag hydroxide-simeth, nitroGLYCERIN, ondansetron (ZOFRAN) IV, sodium chloride flush, zolpidem   Vital Signs    Vitals:   06/21/18 0500 06/21/18 0700 06/21/18 0736 06/21/18 0900  BP: 121/71  133/75   Pulse: (!) 54 (!) 50 (!) 53   Resp:  (!) 25 (!) 23 (!) 29  Temp: 98.1 F (36.7 C)  98 F (36.7 C)   TempSrc: Oral  Oral   SpO2: 96% 92% 99%   Weight:      Height:        Intake/Output Summary (Last 24 hours) at 06/21/2018 1015 Last data filed at 06/21/2018 0800 Gross per 24 hour  Intake 240 ml  Output -  Net 240 ml   Filed Weights   06/19/18 0240 06/20/18 0302  Weight: 103.7 kg 103.2 kg    Telemetry    SR, Sinus brady, occ paced beats, PVCs and pairs, a 7 bt run VT  - Personally Reviewed  ECG    10/01, Sinus brady, HR 45, T wave inversions lat leads are more than previous - Personally Reviewed  Physical Exam   General: Well developed, well nourished, male in no acute distress Head: Eyes PERRLA, No xanthomas.   Normocephalic and atraumatic  Lungs: Clear bilaterally to auscultation. Heart: HRRR S1 S2, without MRG.  Pulses are 2+ & equal.  No JVD. Abdomen: Bowel sounds are present, abdomen soft and non-tender without masses or  hernias noted. Msk: Normal  strength and tone for age. Extremities: No clubbing, cyanosis or edema.    Skin:  No rashes or lesions noted. Neuro: Alert and oriented X 3. Psych:  Kermit Balo affect, responds appropriately  Labs    Chemistry Recent Labs  Lab 06/19/18 0325  NA 137  K 3.9  CL 104  CO2 27  GLUCOSE 174*  BUN 21  CREATININE 1.16  CALCIUM 8.9  PROT 7.1  ALBUMIN 3.8  AST 19  ALT 18  ALKPHOS 47  BILITOT 1.4*  GFRNONAA >60  GFRAA >60  ANIONGAP 6     Hematology Lab Results  Component Value Date   WBC 11.0 (H) 06/20/2018   HGB 15.0 06/20/2018   HCT 46.2 06/20/2018   MCV 96.5 06/20/2018   PLT 131 (L) 06/20/2018     Cardiac Enzymes Recent Labs  Lab 06/19/18 0325 06/19/18 0846 06/19/18 1453  TROPONINI <0.03 <0.03 <0.03      BNP Recent Labs  Lab 06/19/18 0325  BNP 362.7*    Lab Results  Component Value Date   INR 1.69 06/21/2018   INR 2.17 06/20/2018   INR 2.11 06/19/2018     Radiology    Nm Myocar Multi W/spect W/wall Motion /  Ef  Result Date: 06/19/2018  There was no ST segment deviation noted during stress.  No T wave inversion was noted during stress.  Defect 1: There is a large defect of severe severity present in the mid anterior, mid anteroseptal, apical anterior, apical septal, apical inferior, apical lateral and apex location.  Defect 2: There is a medium defect of moderate severity present in the mid inferior location.  This is a high risk study.  Findings consistent with prior myocardial infarction with peri-infarct ischemia.  The left ventricular ejection fraction is moderately decreased (30-44%).  Abnormal, high risk stress nuclear study with large prior anteroseptal and apical infarct; prior inferior infarct with mild peri-infarct ischemia.  Gated ejection fraction 32% with akinesis of the anteroseptal wall and apex.  Severe left ventricular enlargement.  Study interpreted as high risk due to reduced LV function.    Cardiac Studies   05/22/18 TTE  Study  Conclusions  - Left ventricle: The cavity size was normal. Wall thickness was   normal. Systolic function was moderately to severely reduced. The   estimated ejection fraction was in the range of 30% to 35%. The   study is not technically sufficient to allow evaluation of LV   diastolic function. - Regional wall motion abnormality: Akinesis of the mid   anteroseptal, apical septal, apical lateral, and apical   myocardium. - Aortic valve: Valve area (VTI): 3.57 cm^2. Valve area (Vmax):   3.44 cm^2. Valve area (Vmean): 3.47 cm^2. - Left atrium: The atrium was severely dilated. - Technically adequate study.  01/09/17 TTE  Study Conclusions  - Left ventricle: The cavity size was normal. Wall thickness was   increased in a pattern of mild LVH. Systolic function was   severely reduced. The estimated ejection fraction was in the   range of 25% to 30%. There is akinesis of the   mid-apicalanteroseptal, anterior, and apical myocardium. There is   akinesis of the apicalinferior myocardium. No definite formed LV   mural thrombus noted. There is significant low flow and swirling   noted at the apex with Definity contrast. Doppler parameters are   consistent with abnormal left ventricular relaxation (grade 1   diastolic dysfunction). - Aortic valve: Trileaflet; mildly calcified leaflets. - Mitral valve: There was trivial regurgitation. - Left atrium: The atrium was mildly to moderately dilated. - Right ventricle: Pacer wire or catheter noted in right ventricle. - Right atrium: The atrium was mildly dilated. - Tricuspid valve: There was trivial regurgitation. - Pulmonary arteries: PA peak pressure: 26 mm Hg (S). - Pericardium, extracardiac: There was no pericardial effusion.  Impressions:  - Mild LVH with LVEF 25-30%. Large region of mid to apical   anteroseptal, anterior, and inferior apical akinesis consistent   with ischemic cardiomyopathy. No definite formed LV mural   thrombus  noted. There is significant low flow and swirling noted   at the apex with Definity contrast. Grade 1 diastolic   dysfunction. Trivial mitral regurgitation. Mild to moderate left   atrial enlargement. Mildly sclerotic aortic valve. Device wire   noted within the right heart. Trivial tricuspid regurgitation   with PASP 26 mmHg.  nuc study 04/26/16   There was no ST segment deviation noted during stress.  Findings consistent with large prior apical and anteroapical myocardial infarction. There is no significant peri-infarct ischemia.  The left ventricular ejection fraction is moderately decreased (30-44%).  This is an intermediate risk study. Intermediate risk based on decreased LVEF and prior infarct, no significant current  myocardium at jeopardy.  Is  Patient Profile     67 y.o. male with hx PAF, on coumadin, CAD with Ant MI '94, with PCI to LAD, and '98, CX BMS 06/26/08, drug eluting stent to the LAD and OM-1 in 2013), ischemic cardiomyopathy with ejection fraction of 30% status-post dual-chamber ICD ([employer]), prior stroke, ventral now admitted with N&V, upper abd discomfort and diaphoresis, no SOB.  Hx of cholelithiasis, No ICD shocks.    Assessment & Plan    Upper abd pain/chest pain - no gallstones or other obstruction on abd CT - neg ez but MV abnl, results above - INR today 1.69, he is scheduled for a left cardiac cath at noon,  - Heparin per pharmacy - The risks and benefits of a cardiac catheterization including, but not limited to, death, stroke, MI, kidney damage and bleeding were discussed with the patient who indicates understanding and agrees to proceed.  - he prefers a definitive answer - on for Dr Ellyn Hack at noon 10/03  ICM  - EF improved some from lowest values - continue bisoprolol, Entresto and spiro - euvolemic by exam  Has ICD  - Dr [name] reviewed, no changes   Sinus brady  - ICD interrogated by EP on 10/1 - no Afib - NSVT x 7 bts, no sx - occ vent  ectopy - continue BB  PAF  - none since admit - INR trending down   BPH  - continue Proscar  Chronic systolic HF  - wt and volume status stable  Ena Dawley, MD 06/21/2018

## 2018-06-21 NOTE — Progress Notes (Signed)
ANTICOAGULATION CONSULT NOTE - Initial Consult  Pharmacy Consult for Heparin (Coumadin on hold) Indication: atrial fibrillation  No Known Allergies  Patient Measurements: Height: 6\' 1"  (185.4 cm) Weight: 227 lb 8 oz (103.2 kg) IBW/kg (Calculated) : 79.9 Heparin Dosing Weight: 90 kg  Vital Signs: Temp: 98 F (36.7 C) (10/03 0736) Temp Source: Oral (10/03 0736) BP: 133/75 (10/03 0736) Pulse Rate: 53 (10/03 0736)  Labs: Recent Labs    06/19/18 0325 06/19/18 0846 06/19/18 1453 06/20/18 0313 06/20/18 1436 06/21/18 0333  HGB  --   --   --   --  15.0  --   HCT  --   --   --   --  46.2  --   PLT  --   --   --   --  131*  --   APTT 36  --   --   --   --   --   LABPROT 23.5*  --   --  24.0*  --  19.7*  INR 2.11  --   --  2.17  --  1.69  CREATININE 1.16  --   --   --   --   --   TROPONINI <0.03 <0.03 <0.03  --   --   --     Estimated Creatinine Clearance: 76.9 mL/min (by C-G formula based on SCr of 1.16 mg/dL).   Medical History: Past Medical History:  Diagnosis Date  . A-V fistula ([place])   . AICD (automatic cardioverter/defibrillator) present 2006   ICD  . Anterior myocardial infarction ([place])   . Chronic systolic heart failure ([place])   . Coronary atherosclerosis of native coronary artery    a. s/p BMS to LAD and OM1 b. patent stents by cath in 2013  . History of hiatal hernia   . Hypertension   . 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 Ischemic cardiomyopathy    LVEF 30%, status post ICD  . Paroxysmal atrial fibrillation ([facility])    On coumadin  . Pseudoaneurysm ([place])   . Sleep apnea    cpap  . Stroke [facility])    Assessment:  67 yr old male on Coumadin prior to admission for atrial fibrillation. Admitted 10/1 with chest pain. Hx CAD and prior stents. Pharmacy consulted to begin IV heparin.     INR 2.11 on admit 10/1, Coumadin held for cardiac cath, planned for today.  INR down to 1.69.  Cath scheduled for 12n, but Cath Lab expects closer to ~2pm.  Platelet count  156 on admit, 131K on 10/2.    Home Coumadin regimen: 5 mg TTSS, 2.5 mg MWF.  Last dose 9/29  Goal of Therapy:  Heparin level 0.3-0.7 units/ml Monitor platelets by anticoagulation protocol: Yes   Plan:   Begin heparin drip at 1250 units/hr.  Expect cardiac cath sooner than 6 hrs, so no heparin level ordered yet.  Will follow up post-cath.  Coumadin on hold.  Daily PT/INR.  Arty Baumgartner, [place] Pager: [phone] or phone: [phone] 06/21/2018,10:28 AM

## 2018-06-21 NOTE — Progress Notes (Addendum)
Progress Note  Patient Name: [name] Date of Encounter: 06/21/2018  Primary Cardiologist: Rozann Lesches, MD  EP  Thompson Grayer, MD  Subjective   No more CP, no palpitations, unaware of short runs VT, no presyncope  Inpatient Medications    Scheduled Meds: . aspirin EC  81 mg Oral Daily  . bisoprolol  10 mg Oral Daily  . finasteride  5 mg Oral Daily  . omega-3 acid ethyl esters  1 g Oral BID  . rosuvastatin  20 mg Oral q1800  . sacubitril-valsartan  1 tablet Oral BID  . sodium chloride flush  3 mL Intravenous Q12H  . spironolactone  25 mg Oral Daily   Continuous Infusions: . sodium chloride    . sodium chloride 75 mL/hr at 06/21/18 0611   PRN Meds: sodium chloride, acetaminophen, alum & mag hydroxide-simeth, nitroGLYCERIN, ondansetron (ZOFRAN) IV, sodium chloride flush, zolpidem   Vital Signs    Vitals:   06/21/18 0500 06/21/18 0700 06/21/18 0736 06/21/18 0900  BP: 121/71  133/75   Pulse: (!) 54 (!) 50 (!) 53   Resp:  (!) 25 (!) 23 (!) 29  Temp: 98.1 F (36.7 C)  98 F (36.7 C)   TempSrc: Oral  Oral   SpO2: 96% 92% 99%   Weight:      Height:        Intake/Output Summary (Last 24 hours) at 06/21/2018 1015 Last data filed at 06/21/2018 0800 Gross per 24 hour  Intake 240 ml  Output -  Net 240 ml   Filed Weights   06/19/18 0240 06/20/18 0302  Weight: 103.7 kg 103.2 kg    Telemetry    SR, Sinus brady, occ paced beats, PVCs and pairs, a 7 bt run VT  - Personally Reviewed  ECG    10/01, Sinus brady, HR 45, T wave inversions lat leads are more than previous - Personally Reviewed  Physical Exam   General: Well developed, well nourished, male in no acute distress Head: Eyes PERRLA, No xanthomas.   Normocephalic and atraumatic  Lungs: Clear bilaterally to auscultation. Heart: HRRR S1 S2, without MRG.  Pulses are 2+ & equal.  No JVD. Abdomen: Bowel sounds are present, abdomen soft and non-tender without masses or  hernias noted. Msk: Normal  strength and tone for age. Extremities: No clubbing, cyanosis or edema.    Skin:  No rashes or lesions noted. Neuro: Alert and oriented X 3. Psych:  Kermit Balo affect, responds appropriately  Labs    Chemistry Recent Labs  Lab 06/19/18 0325  NA 137  K 3.9  CL 104  CO2 27  GLUCOSE 174*  BUN 21  CREATININE 1.16  CALCIUM 8.9  PROT 7.1  ALBUMIN 3.8  AST 19  ALT 18  ALKPHOS 47  BILITOT 1.4*  GFRNONAA >60  GFRAA >60  ANIONGAP 6     Hematology Lab Results  Component Value Date   WBC 11.0 (H) 06/20/2018   HGB 15.0 06/20/2018   HCT 46.2 06/20/2018   MCV 96.5 06/20/2018   PLT 131 (L) 06/20/2018     Cardiac Enzymes Recent Labs  Lab 06/19/18 0325 06/19/18 0846 06/19/18 1453  TROPONINI <0.03 <0.03 <0.03      BNP Recent Labs  Lab 06/19/18 0325  BNP 362.7*    Lab Results  Component Value Date   INR 1.69 06/21/2018   INR 2.17 06/20/2018   INR 2.11 06/19/2018     Radiology    Nm Myocar Multi W/spect W/wall Motion /  Ef  Result Date: 06/19/2018  There was no ST segment deviation noted during stress.  No T wave inversion was noted during stress.  Defect 1: There is a large defect of severe severity present in the mid anterior, mid anteroseptal, apical anterior, apical septal, apical inferior, apical lateral and apex location.  Defect 2: There is a medium defect of moderate severity present in the mid inferior location.  This is a high risk study.  Findings consistent with prior myocardial infarction with peri-infarct ischemia.  The left ventricular ejection fraction is moderately decreased (30-44%).  Abnormal, high risk stress nuclear study with large prior anteroseptal and apical infarct; prior inferior infarct with mild peri-infarct ischemia.  Gated ejection fraction 32% with akinesis of the anteroseptal wall and apex.  Severe left ventricular enlargement.  Study interpreted as high risk due to reduced LV function.    Cardiac Studies   05/22/18 TTE  Study  Conclusions  - Left ventricle: The cavity size was normal. Wall thickness was   normal. Systolic function was moderately to severely reduced. The   estimated ejection fraction was in the range of 30% to 35%. The   study is not technically sufficient to allow evaluation of LV   diastolic function. - Regional wall motion abnormality: Akinesis of the mid   anteroseptal, apical septal, apical lateral, and apical   myocardium. - Aortic valve: Valve area (VTI): 3.57 cm^2. Valve area (Vmax):   3.44 cm^2. Valve area (Vmean): 3.47 cm^2. - Left atrium: The atrium was severely dilated. - Technically adequate study.  01/09/17 TTE  Study Conclusions  - Left ventricle: The cavity size was normal. Wall thickness was   increased in a pattern of mild LVH. Systolic function was   severely reduced. The estimated ejection fraction was in the   range of 25% to 30%. There is akinesis of the   mid-apicalanteroseptal, anterior, and apical myocardium. There is   akinesis of the apicalinferior myocardium. No definite formed LV   mural thrombus noted. There is significant low flow and swirling   noted at the apex with Definity contrast. Doppler parameters are   consistent with abnormal left ventricular relaxation (grade 1   diastolic dysfunction). - Aortic valve: Trileaflet; mildly calcified leaflets. - Mitral valve: There was trivial regurgitation. - Left atrium: The atrium was mildly to moderately dilated. - Right ventricle: Pacer wire or catheter noted in right ventricle. - Right atrium: The atrium was mildly dilated. - Tricuspid valve: There was trivial regurgitation. - Pulmonary arteries: PA peak pressure: 26 mm Hg (S). - Pericardium, extracardiac: There was no pericardial effusion.  Impressions:  - Mild LVH with LVEF 25-30%. Large region of mid to apical   anteroseptal, anterior, and inferior apical akinesis consistent   with ischemic cardiomyopathy. No definite formed LV mural   thrombus  noted. There is significant low flow and swirling noted   at the apex with Definity contrast. Grade 1 diastolic   dysfunction. Trivial mitral regurgitation. Mild to moderate left   atrial enlargement. Mildly sclerotic aortic valve. Device wire   noted within the right heart. Trivial tricuspid regurgitation   with PASP 26 mmHg.  nuc study 04/26/16   There was no ST segment deviation noted during stress.  Findings consistent with large prior apical and anteroapical myocardial infarction. There is no significant peri-infarct ischemia.  The left ventricular ejection fraction is moderately decreased (30-44%).  This is an intermediate risk study. Intermediate risk based on decreased LVEF and prior infarct, no significant current  myocardium at jeopardy.  Is  Patient Profile     67 y.o. male with hx PAF, on coumadin, CAD with Ant MI '94, with PCI to LAD, and '98, CX BMS 06/26/08, drug eluting stent to the LAD and OM-1 in 2013), ischemic cardiomyopathy with ejection fraction of 30% status-post dual-chamber ICD ([employer]), prior stroke, ventral now admitted with N&V, upper abd discomfort and diaphoresis, no SOB.  Hx of cholelithiasis, No ICD shocks.    Assessment & Plan    Upper abd pain/chest pain - no gallstones or other obstruction on abd CT - neg ez but MV abnl, results above - INR today 1.69, he is scheduled for a left cardiac cath at noon,  - Heparin per pharmacy - The risks and benefits of a cardiac catheterization including, but not limited to, death, stroke, MI, kidney damage and bleeding were discussed with the patient who indicates understanding and agrees to proceed.  - he prefers a definitive answer - on for Dr Ellyn Hack at noon 10/03  ICM  - EF improved some from lowest values - continue bisoprolol, Entresto and spiro - euvolemic by exam  Has ICD  - Dr [name] reviewed, no changes   Sinus brady  - ICD interrogated by EP on 10/1 - no Afib - NSVT x 7 bts, no sx - occ vent  ectopy - continue BB  PAF  - none since admit - INR trending down   BPH  - continue Proscar  Chronic systolic HF  - wt and volume status stable  Ena Dawley, MD 06/21/2018

## 2018-06-21 NOTE — Interval H&P Note (Signed)
History and Physical Interval Note:  06/21/2018 6:15 PM  [name]  has presented today for surgery, with the diagnosis of Atypical Angina, Arrhythmia, Abnormal Nuclear Stress Test.  The various methods of treatment have been discussed with the patient and family. After consideration of risks, benefits and other options for treatment, the patient has consented to  Procedure(s): LEFT HEART CATH AND CORONARY ANGIOGRAPHY (N/A) with possible PERCUTANEOUS CORONARY INTERVENTION as a surgical intervention .  The patient's history has been reviewed, patient examined, no change in status, stable for surgery.  I have reviewed the patient's chart and labs.  Questions were answered to the patient's satisfaction.    Cath Lab Visit (complete for each Cath Lab visit)  Clinical Evaluation Leading to the Procedure:   ACS: No.  Non-ACS:    Anginal Classification: CCS II  Anti-ischemic medical therapy: Maximal Therapy (2 or more classes of medications)  Non-Invasive Test Results: High-risk stress test findings: cardiac mortality >3%/year  Prior CABG: No previous CABG    Glenetta Hew

## 2018-06-22 ENCOUNTER — Encounter (HOSPITAL_COMMUNITY): Payer: Self-pay | Admitting: Cardiology

## 2018-06-22 ENCOUNTER — Telehealth: Payer: Self-pay

## 2018-06-22 DIAGNOSIS — I208 Other forms of angina pectoris: Secondary | ICD-10-CM

## 2018-06-22 LAB — PROTIME-INR
INR: 1.65
Prothrombin Time: 19.3 seconds — ABNORMAL HIGH (ref 11.4–15.2)

## 2018-06-22 MED ORDER — WARFARIN - PHARMACIST DOSING INPATIENT: Freq: Every day | Status: DC

## 2018-06-22 MED ORDER — WARFARIN SODIUM 2.5 MG PO TABS: 2.5000 mg | ORAL_TABLET | ORAL | Status: DC

## 2018-06-22 MED ORDER — WARFARIN SODIUM 5 MG PO TABS: 5.0000 mg | ORAL_TABLET | ORAL | Status: DC

## 2018-06-22 MED FILL — Heparin Sod (Porcine)-NaCl IV Soln 1000 Unit/500ML-0.9%: INTRAVENOUS | Qty: 1000 | Status: AC

## 2018-06-22 NOTE — Discharge Summary (Signed)
Discharge Summary    Patient ID: [name] MRN: [id number]; DOB: 1951-02-10  Admit date: 06/19/2018 Discharge date: 06/22/2018  Primary Care Provider: Rory Percy, MD  Primary Cardiologist: Rozann Lesches, MD  Primary Electrophysiologist:  Thompson Grayer, MD   Discharge Diagnoses    Principal Problem:   Abnormal nuclear stress test Active Problems:   Mixed hyperlipidemia   Cardiomyopathy, ischemic   Coronary atherosclerosis of native coronary artery   Atrial fibrillation [facility])   Chest pain   Atypical angina [facility])   Allergies No Known Allergies  Diagnostic Studies/Procedures    Left heart cath 06/21/18:  There is severe left ventricular systolic dysfunction. The left ventricular ejection fraction is 25-35% by visual estimate.  LV end diastolic pressure is normal.  Minimal disease with underlying two-vessel disease involving LAD and circumflex:  Ost Cx to Prox Cx stent is 5% stenosed.  Prox LAD stent is 10% stenosed.  Mid Cx lesion is 40% stenosed.  Angiographically minimal disease with widely patent stents in the circumflex and LAD. Severely reduced LVEF of 30% with regional wall motion normality in the anterior and inferior apex with swirling of flow. Normal LVEDP  Plan: Return to nursing unit for ongoing care. TR band removal. Further plans per primary team and EP.   Echo 05/22/18 Study Conclusions  - Left ventricle: The cavity size was normal. Wall thickness was normal. Systolic function was moderately to severely reduced. The estimated ejection fraction was in the range of 30% to 35%. The study is not technically sufficient to allow evaluation of LV diastolic function. - Regional wall motion abnormality: Akinesis of the mid anteroseptal, apical septal, apical lateral, and apical myocardium. - Aortic valve: Valve area (VTI): 3.57 cm^2. Valve area (Vmax): 3.44 cm^2. Valve area (Vmean): 3.47 cm^2. - Left atrium: The atrium was  severely dilated. - Technically adequate study.   History of Present Illness     [name] is a 67 y.o. male with history of paroxysmal atrial fibrillation on warfarin, coronary artery disease (with a history anterior MI '94 LAD PCI '94 and '98, CX PCI '98; CX BMS 06/26/08, drug eluting stent to the LAD and OM-1 in 2013), ischemic cardiomyopathy with ejection fraction of 30% status-post dual-chamber ICD (St. Jude), prior stroke, ventral hernia repair 10/2017, BPH who presented with chest discomfort. He developed symptoms at approximately 4:30 pm yesterday (06/18/18) while sitting first. He began to feel gassy, bloated, nauseated and vomited several times with upper abdominal discomfort. This was accompanied by diaphoresis. He had a sharp pain in his right chest area and the middle of his back. His symptoms were persistent at the time of his arrival to the [facility] Emergency Department. He underwent a radiograph of the chest with no acute thoracic process and his initial troponin was negative. He denies having had any shortness of breath. His symptoms improved with Maalox. No fevers. He did have chills. No known sick contacts. He ate [employer] for lunch 06/18/18 at 11:30 pm and ate a Cajun chicken filet sandwich which is not a usual lunch for him. He also has history of cholelithiasis.  No ICD shocks. No pre-syncope, syncope. He has had more frequent bowel movements recently over the past few weeks.   Hospital Course     Consultants:   Patient was admitted with known CAD and chest pain. He underwent myoview stress test 06/19/18 which was abnormal with large prior anteroseptal and apical infarct with mild peri-infarct ischemia and EF of 32% with akinesis of the  anteroseptal wall and apex. Decision was made to obtain definitive angiography. Coumadin was held. He underwent heart catheterization 06/21/18. Heart cath showed widely patent Cx and LAD stents with severely reduced EF. He tolerated the  procedure well. Right radial access site C/D/I without hematoma.   Echocardiogram from 05/22/18 with LVEF of 30-35% and RWA with akinesis of the mid anteroseptal, apical septal, apical lateral, and apical myocardium. CT abdomen was without gallstones or other obstruction. He has had no further chest pain. Coumadin was restarted. ICD was reviewed by [name] with no changes.   He was seen and examined by Dr. Meda Coffee and deemed stable for discharge. All follow up has been arranged. Restart coumadin on his home dose tonight.   _____________  Discharge Vitals Blood pressure 101/65, pulse (!) 59, temperature (!) 97.5 F (36.4 C), temperature source Oral, resp. rate (!) 26, height 6\' 1"  (1.854 m), weight 103.2 kg, SpO2 93 %.  Filed Weights   06/19/18 0240 06/20/18 0302  Weight: 103.7 kg 103.2 kg    Labs & Radiologic Studies    CBC Recent Labs    06/20/18 1436  WBC 11.0*  NEUTROABS 7.9*  HGB 15.0  HCT 46.2  MCV 96.5  PLT 564*   Basic Metabolic Panel No results for input(s): NA, K, CL, CO2, GLUCOSE, BUN, CREATININE, CALCIUM, MG, PHOS in the last 72 hours. Liver Function Tests No results for input(s): AST, ALT, ALKPHOS, BILITOT, PROT, ALBUMIN in the last 72 hours. No results for input(s): LIPASE, AMYLASE in the last 72 hours. Cardiac Enzymes Recent Labs    06/19/18 1453  TROPONINI <0.03   BNP Invalid input(s): POCBNP D-Dimer No results for input(s): DDIMER in the last 72 hours. Hemoglobin A1C No results for input(s): HGBA1C in the last 72 hours. Fasting Lipid Panel No results for input(s): CHOL, HDL, LDLCALC, TRIG, CHOLHDL, LDLDIRECT in the last 72 hours. Thyroid Function Tests No results for input(s): TSH, T4TOTAL, T3FREE, THYROIDAB in the last 72 hours.  Invalid input(s): FREET3 _____________  [place] Transrectal Complete  Result Date: 06/07/2018 CLINICAL DATA:  Ultrasound was provided for use by the ordering physician, and a technical charge was applied by the performing  facility.  No radiologist interpretation/professional services rendered.   Nm Myocar Multi W/spect W/wall Motion / Ef  Result Date: 06/19/2018  There was no ST segment deviation noted during stress.  No T wave inversion was noted during stress.  Defect 1: There is a large defect of severe severity present in the mid anterior, mid anteroseptal, apical anterior, apical septal, apical inferior, apical lateral and apex location.  Defect 2: There is a medium defect of moderate severity present in the mid inferior location.  This is a high risk study.  Findings consistent with prior myocardial infarction with peri-infarct ischemia.  The left ventricular ejection fraction is moderately decreased (30-44%).  Abnormal, high risk stress nuclear study with large prior anteroseptal and apical infarct; prior inferior infarct with mild peri-infarct ischemia.  Gated ejection fraction 32% with akinesis of the anteroseptal wall and apex.  Severe left ventricular enlargement.  Study interpreted as high risk due to reduced LV function.   Dg Abd 2 Views  Result Date: 06/19/2018 CLINICAL DATA:  Abdominal pain. EXAM: ABDOMEN - 2 VIEW COMPARISON:  CT abdomen and pelvis 07/11/2017 FINDINGS: No intraperitoneal free air is identified. Scattered gas is noted in nondilated loops of bowel without evidence of obstruction. Small calcifications in the pelvis likely represent phleboliths. Asymmetrically advanced left hip osteoarthrosis is noted. IMPRESSION:  Nonobstructed bowel gas pattern. Electronically Signed   By: Logan Bores M.D.   On: 06/19/2018 09:12   Disposition   Pt is being discharged home today in good condition.  Follow-up Plans & Appointments    Follow-up Information    Satira Sark, MD Follow up on 07/24/2018.   Specialty:  Cardiology Why:  2:00 pm hospital follow up Contact information: [place] [id number] [phone]          Discharge Instructions    Diet - low sodium  heart healthy   Complete by:  As directed    Discharge instructions   Complete by:  As directed    No driving for 2 days. No lifting over 5 lbs for 1 week. No sexual activity for 1 week. You may return to work in 1 week. Keep procedure site clean & dry. If you notice increased pain, swelling, bleeding or pus, call/return!  You may shower, but no soaking baths/hot tubs/pools for 1 week.   Increase activity slowly   Complete by:  As directed       Discharge Medications   Allergies as of 06/22/2018   No Known Allergies     Medication List    TAKE these medications   aspirin 81 MG tablet Take 81 mg by mouth daily.   bisoprolol 5 MG tablet Commonly known as:  ZEBETA TAKE 1 TABLET (5 MG TOTAL) BY MOUTH DAILY. What changed:    how much to take  how to take this  when to take this  additional instructions   calcium carbonate 500 MG chewable tablet Commonly known as:  TUMS - dosed in mg elemental calcium Chew 1-2 tablets by mouth daily as needed for indigestion or heartburn.   ENTRESTO 24-26 MG Generic drug:  sacubitril-valsartan TAKE 1 TABLET BY MOUTH TWICE A DAY   finasteride 5 MG tablet Commonly known as:  PROSCAR Take 5 mg by mouth Daily.   nitroGLYCERIN 0.4 MG SL tablet Commonly known as:  NITROSTAT Place 1 tablet (0.4 mg total) under the tongue every 5 (five) minutes as needed for chest pain.   omega-3 acid ethyl esters 1 g capsule Commonly known as:  LOVAZA Take 2 g by mouth 2 (two) times daily.   rosuvastatin 20 MG tablet Commonly known as:  CRESTOR TAKE 1 TABLET (20 MG TOTAL) BY MOUTH DAILY. What changed:  See the new instructions.   spironolactone 25 MG tablet Commonly known as:  ALDACTONE TAKE 1 TABLET BY MOUTH DAILY   warfarin 5 MG tablet Commonly known as:  COUMADIN Take as directed. If you are unsure how to take this medication, talk to your nurse or doctor. Original instructions:  TAKE 1 TABLET BY MOUTH DAILY EXCEPT 1/2 A TABLET ON MONDAYS AND  FRIDAYS What changed:  See the new instructions.        Acute coronary syndrome (MI, NSTEMI, STEMI, etc) this admission?: No.    Outstanding Labs/Studies   INR  Duration of Discharge Encounter   Greater than 30 minutes including physician time.  Signed, Tami Lin Dorrien Grunder, PA 06/22/2018, 11:06 AM

## 2018-06-22 NOTE — Telephone Encounter (Signed)
Received call from patient and provided ICM intro.  He is agreeable to monthly follow up.  Advised monitor should be by bedside in order for it to automatically transmit a report during sleep hours of 12 midnight and 6 AM.   Advised will receive a call after the transmission is reviewed to provide results.  Explained a Remote Home Transmission will be seen as daytime appointment on office summary visit sheet but the time is not relevant since all transmissions are sent at night time so there is no obligation to stay by the monitor at that time appointed time during the day. Provided ICM number and explained should call if experiencing any fluid symptoms such as weight gain, shortness of breath or extremity/abdominal swelling. 1st ICM remote transmission scheduled 06/26/2018

## 2018-06-22 NOTE — Progress Notes (Signed)
Pt educated and provided discharge instructions. Pt vitals stable. Pt has all belongings. Iv's removed and intact/telebox removed. Pt denies any complaints. Volunteers called to transport pt via wheelchair to valet to meet wife. Jerald Kief, RN

## 2018-06-22 NOTE — Telephone Encounter (Signed)
Referred to ICM clinic by Dr [name].  Attempted patient call and he is currently receiving discharge instructions at the hospital. Per chart notes, patient had heart catheretization.  Advised will call him back later today.

## 2018-06-22 NOTE — Telephone Encounter (Signed)
Attempted ICM Intro call and left message for return call.

## 2018-06-22 NOTE — Progress Notes (Addendum)
Progress Note  Patient Name: [name] Date of Encounter: 06/22/2018  Primary Cardiologist: Rozann Lesches, MD   Subjective   No chest pain, eager to discharge  Inpatient Medications    Scheduled Meds: . aspirin EC  81 mg Oral Daily  . bisoprolol  10 mg Oral Daily  . finasteride  5 mg Oral Daily  . omega-3 acid ethyl esters  1 g Oral BID  . rosuvastatin  20 mg Oral q1800  . sacubitril-valsartan  1 tablet Oral BID  . sodium chloride flush  3 mL Intravenous Q12H  . spironolactone  25 mg Oral Daily   Continuous Infusions: . sodium chloride     PRN Meds: sodium chloride, acetaminophen, alum & mag hydroxide-simeth, nitroGLYCERIN, ondansetron (ZOFRAN) IV, sodium chloride flush, zolpidem   Vital Signs    Vitals:   06/21/18 1936 06/22/18 0007 06/22/18 0614 06/22/18 0700  BP: 104/61 110/64 101/65   Pulse: 68 74 (!) 59 (!) 59  Resp: 18 18 18  (!) 26  Temp: 98.4 F (36.9 C) 98.1 F (36.7 C) (!) 97.5 F (36.4 C)   TempSrc: Oral Oral Oral   SpO2: 94% 100% 97% 93%  Weight:      Height:        Intake/Output Summary (Last 24 hours) at 06/22/2018 0920 Last data filed at 06/22/2018 0800 Gross per 24 hour  Intake 740.7 ml  Output -  Net 740.7 ml   Filed Weights   06/19/18 0240 06/20/18 0302  Weight: 103.7 kg 103.2 kg    Telemetry    sinus - Personally Reviewed  ECG    No new tracings - Personally Reviewed  Physical Exam   GEN: No acute distress.   Neck: No JVD Cardiac: RRR, no murmurs, rubs, or gallops.  Respiratory: Clear to auscultation bilaterally. GI: Soft, nontender, non-distended  MS: No edema; No deformity. Neuro:  Nonfocal  Psych: Normal affect   Labs    Chemistry Recent Labs  Lab 06/19/18 0325  NA 137  K 3.9  CL 104  CO2 27  GLUCOSE 174*  BUN 21  CREATININE 1.16  CALCIUM 8.9  PROT 7.1  ALBUMIN 3.8  AST 19  ALT 18  ALKPHOS 47  BILITOT 1.4*  GFRNONAA >60  GFRAA >60  ANIONGAP 6     Hematology Recent Labs  Lab  06/20/18 1436  WBC 11.0*  RBC 4.79  HGB 15.0  HCT 46.2  MCV 96.5  MCH 31.3  MCHC 32.5  RDW 13.3  PLT 131*    Cardiac Enzymes Recent Labs  Lab 06/19/18 0325 06/19/18 0846 06/19/18 1453  TROPONINI <0.03 <0.03 <0.03   No results for input(s): TROPIPOC in the last 168 hours.   BNP Recent Labs  Lab 06/19/18 0325  BNP 362.7*     DDimer No results for input(s): DDIMER in the last 168 hours.   Radiology    No results found.  Cardiac Studies   Left heart cath 06/21/18:  There is severe left ventricular systolic dysfunction. The left ventricular ejection fraction is 25-35% by visual estimate.  LV end diastolic pressure is normal.  Minimal disease with underlying two-vessel disease involving LAD and circumflex:  Ost Cx to Prox Cx stent is 5% stenosed.  Prox LAD stent is 10% stenosed.  Mid Cx lesion is 40% stenosed.   Angiographically minimal disease with widely patent stents in the circumflex and LAD. Severely reduced LVEF of 30% with regional wall motion normality in the anterior and inferior apex with swirling  of flow. Normal LVEDP  Plan: Return to nursing unit for ongoing care.  TR band removal. Further plans per primary team and EP.   Echo 05/22/18 Study Conclusions  - Left ventricle: The cavity size was normal. Wall thickness was   normal. Systolic function was moderately to severely reduced. The   estimated ejection fraction was in the range of 30% to 35%. The   study is not technically sufficient to allow evaluation of LV   diastolic function. - Regional wall motion abnormality: Akinesis of the mid   anteroseptal, apical septal, apical lateral, and apical   myocardium. - Aortic valve: Valve area (VTI): 3.57 cm^2. Valve area (Vmax):   3.44 cm^2. Valve area (Vmean): 3.47 cm^2. - Left atrium: The atrium was severely dilated. - Technically adequate study.   Patient Profile     67 y.o. male with hx PAF, on coumadin, CAD with Ant MI '94, with PCI to  LAD, and '98, CX BMS 06/26/08,drug eluting stent to the LAD and OM-1 in 2013), ischemic cardiomyopathy with ejection fraction of 30% status-post dual-chamber ICD (St. Jude), prior stroke, ventral now admitted with N&V, upper abd discomfort and diaphoresis, no SOB.  Hx of cholelithiasis, No ICD shocks.   Assessment & Plan    1. Chest pain / upper abdominal pain - heart cath yesterday with nonobstructive disease and patent stents in the Cx and LAD - no gallstones or other obstruction by CT - consider other etiologies of chest pain - continue ASA  2. ICM, chronic systolic heart failure - echo with LVEF of 30-35%, confirmed by heart cath yesterday - continue bisoprolol, entresto, spironolactone - not currently on lasix here or at home - overall net positive, but appears euvolemic today, slightly diminished in bases  3. ICD in place - [name] reviewed, no changes  4. PAF, sinus bradycardia - restart coumadin - device reviewed, no changes - continue BB  5. BPH - continue proscar   For questions or updates, please contact [place] Please consult [email or web address] for contact info under   Signed, Ledora Bottcher, PA  06/22/2018, 9:20 AM    The patient was seen, examined and discussed with Minette Brine , PA-C and I agree with the above.   Patient's cardiac catheterization showed nonobstructive disease with patent stents in both circumflex and LAD arteries.  He is currently chest pain-free and overall feels great eager to go home.  We will restart warfarin, discharged today and arrange for outpatient follow-up.  His cath insertion site without any swelling bruit or bleeding.  Good peripheral pulses.  Ena Dawley, MD 06/22/2018

## 2018-06-26 ENCOUNTER — Ambulatory Visit (INDEPENDENT_AMBULATORY_CARE_PROVIDER_SITE_OTHER): Payer: BLUE CROSS/BLUE SHIELD

## 2018-06-26 ENCOUNTER — Ambulatory Visit (INDEPENDENT_AMBULATORY_CARE_PROVIDER_SITE_OTHER): Payer: BLUE CROSS/BLUE SHIELD | Admitting: *Deleted

## 2018-06-26 DIAGNOSIS — I255 Ischemic cardiomyopathy: Secondary | ICD-10-CM | POA: Diagnosis not present

## 2018-06-26 DIAGNOSIS — Z95810 Presence of automatic (implantable) cardiac defibrillator: Secondary | ICD-10-CM

## 2018-06-26 DIAGNOSIS — I5022 Chronic systolic (congestive) heart failure: Secondary | ICD-10-CM

## 2018-06-26 NOTE — Progress Notes (Signed)
EPIC Encounter for ICM Monitoring  Patient Name: [name] is a 67 y.o. male Date: 06/26/2018 Primary Care Physican: Rory Percy, MD Primary Cardiologist: Domenic Polite Electrophysiologist: Allred Dry Weight: 220.8 lbs         Heart Failure questions reviewed, pt asymptomatic.     Thoracic impedance slightly abnormal but trending close to baseline.  Prescribed: Spironolactone 25 mg take 1 tablet daily.  Recommendations: No changes. Discussed low salt diet and encouraged to review food labels for salt amount.  He and his wife do eat at restaurants frequently.  Advised restaurant foods are also high in salt.  Encouraged to call for fluid symptoms.  Follow-up plan: ICM clinic phone appointment on 07/27/2018.    Copy of ICM check sent to [name].   3 month ICM trend: 06/26/2018    1 Year ICM trend:       Rosalene Billings, RN 06/26/2018 3:16 PM

## 2018-06-27 NOTE — Progress Notes (Signed)
Remote ICD transmission.   

## 2018-07-03 ENCOUNTER — Ambulatory Visit (INDEPENDENT_AMBULATORY_CARE_PROVIDER_SITE_OTHER): Payer: BLUE CROSS/BLUE SHIELD | Admitting: *Deleted

## 2018-07-03 DIAGNOSIS — Z23 Encounter for immunization: Secondary | ICD-10-CM

## 2018-07-03 DIAGNOSIS — Z5181 Encounter for therapeutic drug level monitoring: Secondary | ICD-10-CM | POA: Diagnosis not present

## 2018-07-03 DIAGNOSIS — I4891 Unspecified atrial fibrillation: Secondary | ICD-10-CM

## 2018-07-03 LAB — POCT INR: INR: 2.2 (ref 2.0–3.0)

## 2018-07-03 NOTE — Patient Instructions (Signed)
Continue coumadin dose to 1 tablet daily except 1/2 tablet on Mondays, Wednesdays and Fridays Recheck in 3 weeks

## 2018-07-05 ENCOUNTER — Encounter: Payer: Self-pay | Admitting: Cardiology

## 2018-07-10 ENCOUNTER — Other Ambulatory Visit: Payer: Self-pay | Admitting: Cardiology

## 2018-07-18 ENCOUNTER — Other Ambulatory Visit: Payer: Self-pay | Admitting: Cardiology

## 2018-07-24 ENCOUNTER — Ambulatory Visit (INDEPENDENT_AMBULATORY_CARE_PROVIDER_SITE_OTHER): Payer: BLUE CROSS/BLUE SHIELD | Admitting: *Deleted

## 2018-07-24 ENCOUNTER — Encounter: Payer: Self-pay | Admitting: Cardiology

## 2018-07-24 ENCOUNTER — Ambulatory Visit (INDEPENDENT_AMBULATORY_CARE_PROVIDER_SITE_OTHER): Payer: BLUE CROSS/BLUE SHIELD | Admitting: Cardiology

## 2018-07-24 VITALS — BP 102/60 | HR 84 | Ht 73.0 in | Wt 227.0 lb

## 2018-07-24 DIAGNOSIS — I255 Ischemic cardiomyopathy: Secondary | ICD-10-CM

## 2018-07-24 DIAGNOSIS — I480 Paroxysmal atrial fibrillation: Secondary | ICD-10-CM | POA: Diagnosis not present

## 2018-07-24 DIAGNOSIS — G4733 Obstructive sleep apnea (adult) (pediatric): Secondary | ICD-10-CM

## 2018-07-24 DIAGNOSIS — Z5181 Encounter for therapeutic drug level monitoring: Secondary | ICD-10-CM | POA: Diagnosis not present

## 2018-07-24 DIAGNOSIS — I4891 Unspecified atrial fibrillation: Secondary | ICD-10-CM

## 2018-07-24 DIAGNOSIS — Z95810 Presence of automatic (implantable) cardiac defibrillator: Secondary | ICD-10-CM | POA: Diagnosis not present

## 2018-07-24 LAB — POCT INR: INR: 3.6 — AB (ref 2.0–3.0)

## 2018-07-24 NOTE — Patient Instructions (Signed)
Medication Instructions:   Your physician recommends that you continue on your current medications as directed. Please refer to the Current Medication list given to you today.  Labwork:  none  Testing/Procedures:  none  Follow-Up:  Your physician recommends that you schedule a follow-up appointment in: 4 months.   Any Other Special Instructions Will Be Listed Below (If Applicable).  If you need a refill on your cardiac medications before your next appointment, please call your pharmacy. 

## 2018-07-24 NOTE — Patient Instructions (Signed)
Hold coumadin tonight then decrease dose to 1/2 tablet daily except 1 tablet on Sundays, Tuesdays and Thursdays Recheck in 3 weeks 

## 2018-07-24 NOTE — Progress Notes (Signed)
Cardiology Office Note  Date: 07/24/2018   ID: [name], DOB Jan 26, 1951, MRN [id number]  PCP: Rory Percy, MD  Primary Cardiologist: Rozann Lesches, MD   Chief Complaint  Patient presents with  . Cardiomyopathy    History of Present Illness: [name] is a 67 y.o. male presenting to the office for a follow-up visit.  I last saw him in August.  I reviewed interval records including hospitalization in October with chest pain.  He ruled out for ACS at that time and was initially evaluated via noninvasive approach with a Lexiscan Myoview.  Study was interpreted as high risk due to reduced ejection fraction of 32% with perfusion imaging showing previous large anteroseptal and apical infarct scar as well as inferior infarct scar associated with mild peri-infarct ischemia.  This led to a follow-up diagnostic cardiac catheterization which was performed by Dr. Ellyn Hack and fortunately revealed patent stent sites within the proximal LAD and ostial/proximal circumflex with only mild residual atherosclerosis.  Medical therapy was recommended.  He presents today stating that he feels well, no shortness of breath or chest discomfort.  In talking with him about the symptoms, it sounds like most of what he was experiencing prompting hospitalization were actually abdominal symptoms, pain and nausea.  He continues on Coumadin with follow-up in the anticoagulation clinic.  He reports no bleeding problems.  Went over his medications which are listed below.  He is tolerating current regimen well and tends to run a relatively low blood pressure at baseline which he also tolerates.  Past Medical History:  Diagnosis Date  . A-V fistula ([place])   . AICD (automatic cardioverter/defibrillator) present 2006   ICD  . Anterior myocardial infarction ([place])   . Chronic systolic heart failure ([place])   . Coronary atherosclerosis of native coronary artery    a. s/p BMS to LAD and OM1 b. patent stents by  cath in 2013  . History of hiatal hernia   . Hypertension   . 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 Ischemic cardiomyopathy    LVEF 30%, status post ICD  . OSA on CPAP   . Paroxysmal atrial fibrillation ([facility])    On coumadin  . Pseudoaneurysm ([place])   . Stroke [facility])     Past Surgical History:  Procedure Laterality Date  . AICD implantation  2011   St Jude ICD implanted for primary prevention of sudden death  . CATARACT EXTRACTION W/PHACO Left 03/27/2017   Procedure: CATARACT EXTRACTION PHACO AND INTRAOCULAR LENS PLACEMENT (IOC);  Surgeon: [name], MD;  Location: [facility];  Service: Ophthalmology;  Laterality: Left;  CDE: 8.48  . CATARACT EXTRACTION W/PHACO Right 05/29/2017   Procedure: CATARACT EXTRACTION PHACO AND INTRAOCULAR LENS PLACEMENT (IOC);  Surgeon: [name], MD;  Location: [facility];  Service: Ophthalmology;  Laterality: Right;  CDE: 7.48  . EYE SURGERY    . ICD GENERATOR CHANGEOUT N/A 01/19/2017   SJM [place] VR ICD generator change by Dr [name]  . LAPAROSCOPY N/A 10/23/2017   Procedure: LAPAROSCOPY DIAGNOSTIC;  Surgeon: [name], MD;  Location: [place];  Service: General;  Laterality: N/A;  . LEFT HEART CATH AND CORONARY ANGIOGRAPHY N/A 06/21/2018   Procedure: LEFT HEART CATH AND CORONARY ANGIOGRAPHY;  Surgeon: [name], MD;  Location: [place] [facility];  Service: Cardiovascular;  Laterality: N/A;  . VENTRAL HERNIA REPAIR N/A 10/23/2017   Procedure: VENTRAL HERNIA REPAIR;  Surgeon: [name], MD;  Location: [facility];  Service: General;  Laterality:  N/A;    Current Outpatient Medications  Medication Sig Dispense Refill  . aspirin 81 MG tablet Take 81 mg by mouth daily.      . bisoprolol (ZEBETA) 5 MG tablet TAKE 1 TABLET (5 MG TOTAL) BY MOUTH DAILY. (Patient taking differently: Take 5 mg by mouth daily. ) 90 tablet 1  . calcium carbonate (TUMS - DOSED IN MG ELEMENTAL CALCIUM) 500 MG chewable tablet Chew 1-2 tablets by mouth daily as needed for  indigestion or heartburn.    [name] ENTRESTO 24-26 MG TAKE 1 TABLET BY MOUTH TWICE A DAY 180 tablet 3  . finasteride (PROSCAR) 5 MG tablet Take 5 mg by mouth Daily.     . nitroGLYCERIN (NITROSTAT) 0.4 MG SL tablet Place 1 tablet (0.4 mg total) under the tongue every 5 (five) minutes as needed for chest pain. 25 tablet 0  . omega-3 acid ethyl esters (LOVAZA) 1 G capsule Take 2 g by mouth 2 (two) times daily.     . rosuvastatin (CRESTOR) 20 MG tablet TAKE 1 TABLET (20 MG TOTAL) BY MOUTH DAILY. 90 tablet 2  . spironolactone (ALDACTONE) 25 MG tablet TAKE 1 TABLET BY MOUTH DAILY (Patient taking differently: Take 25 mg by mouth daily. ) 90 tablet 3  . warfarin (COUMADIN) 5 MG tablet TAKE 1 TABLET BY MOUTH DAILY EXCEPT 1/2 A TABLET ON MONDAYS AND FRIDAYS (Patient taking differently: Take 2.5-5 mg by mouth See admin instructions. TAKE 2.5 MG (0.5 TAB) ON   MON,WED, FRI AND 5 MG (1 TAB) ALL OTHER DAYS.) 90 tablet 3   No current facility-administered medications for this visit.    Allergies:  Patient has no known allergies.   Social History: The patient  reports that he has never smoked. He has never used smokeless tobacco. He reports that he drinks alcohol. He reports that he does not use drugs.   ROS:  Please see the history of present illness. Otherwise, complete review of systems is positive for none.  All other systems are reviewed and negative.   Physical Exam: VS:  BP 102/60 (BP Location: Left Arm)   Pulse 84   Ht 6\' 1"  (1.854 m)   Wt 227 lb (103 kg)   SpO2 97%   BMI 29.95 kg/m , BMI Body mass index is 29.95 kg/m.  Wt Readings from Last 3 Encounters:  07/24/18 227 lb (103 kg)  06/20/18 227 lb 8 oz (103.2 kg)  05/25/18 228 lb (103.4 kg)    General: Patient appears comfortable at rest. HEENT: Conjunctiva and lids normal, oropharynx clear. Neck: Supple, no elevated JVP or carotid bruits, no thyromegaly. Lungs: Clear to auscultation, nonlabored breathing at rest. Cardiac: Regular rate and  rhythm, no S3 or significant systolic murmur. Abdomen: Soft, nontender, bowel sounds present. Extremities: No pitting edema, distal pulses 2+. Skin: Warm and dry. Musculoskeletal: No kyphosis. Neuropsychiatric: Alert and oriented x3, affect grossly appropriate.  ECG: I personally reviewed the tracing from 06/19/2018 which showed sinus rhythm with low voltage and old inferior/anterior infarct pattern.  Recent Labwork: 06/19/2018: ALT 18; AST 19; B Natriuretic Peptide 362.7; BUN 21; Creatinine, Ser 1.16; Potassium 3.9; Sodium 137; TSH 0.564 06/20/2018: Hemoglobin 15.0; Platelets 131   Other Studies Reviewed Today:  Cardiac catheterization 06/21/2018:  There is severe left ventricular systolic dysfunction. The left ventricular ejection fraction is 25-35% by visual estimate.  LV end diastolic pressure is normal.  Minimal disease with underlying two-vessel disease involving LAD and circumflex:  Ost Cx to Prox Cx stent is 5%  stenosed.  Prox LAD stent is 10% stenosed.  Mid Cx lesion is 40% stenosed.   Angiographically minimal disease with widely patent stents in the circumflex and LAD. Severely reduced LVEF of 30% with regional wall motion normality in the anterior and inferior apex with swirling of flow. Normal LVEDP  Echocardiogram 05/22/2018: Study Conclusions  - Left ventricle: The cavity size was normal. Wall thickness was   normal. Systolic function was moderately to severely reduced. The   estimated ejection fraction was in the range of 30% to 35%. The   study is not technically sufficient to allow evaluation of LV   diastolic function. - Regional wall motion abnormality: Akinesis of the mid   anteroseptal, apical septal, apical lateral, and apical   myocardium. - Aortic valve: Valve area (VTI): 3.57 cm^2. Valve area (Vmax):   3.44 cm^2. Valve area (Vmean): 3.47 cm^2. - Left atrium: The atrium was severely dilated. - Technically adequate study.  Assessment and Plan:  1.   Ischemic cardiomyopathy with LVEF 30 to 35% range based on most recent echocardiogram and also Myoview.  He is tolerating current regimen well and recent cardiac catheterization demonstrated widely patent stent sites within the LAD and circumflex as detailed above.  No changes were made today.  2.  Paroxysmal atrial fibrillation, he remains on Coumadin with follow-up in the anticoagulation clinic.  With previous history of stroke we have also continued low-dose aspirin.  3.  St. Jude ICD in place.  No device shocks.  He continues to follow with [name].  4.  OSA on CPAP.  Current medicines were reviewed with the patient today.  Disposition: Follow-up in 4 months.  Signed, Satira Sark, MD, [facility] 07/24/2018 2:44 PM    [facility] [address], [place], [place] [address] Phone: [phone]; Fax: [phone]

## 2018-07-27 ENCOUNTER — Ambulatory Visit (INDEPENDENT_AMBULATORY_CARE_PROVIDER_SITE_OTHER): Payer: BLUE CROSS/BLUE SHIELD

## 2018-07-27 DIAGNOSIS — I5022 Chronic systolic (congestive) heart failure: Secondary | ICD-10-CM

## 2018-07-27 DIAGNOSIS — Z95810 Presence of automatic (implantable) cardiac defibrillator: Secondary | ICD-10-CM

## 2018-07-27 NOTE — Progress Notes (Signed)
EPIC Encounter for ICM Monitoring  Patient Name: [name] is a 67 y.o. male Date: 07/27/2018 Primary Care Physican: Rory Percy, MD Primary Cardiologist: Domenic Polite Electrophysiologist: Allred Last Weight: 220.8 lbs        Heart Failure questions reviewed, pt asymptomatic.  Patient is in [place] [employer] for football game and unable to adhere to low salt diet until he returns home.    Thoracic impedance slightly below baseline normal.   Prescribed: Spironolactone 25 mg take 1 tablet daily.  Recommendations: No changes.  Reinforced limiting salt intake to < 2000 mg daily and fluid intake to 64 oz daily.  Encouraged to call for fluid symptoms.  Follow-up plan: ICM clinic phone appointment on 08/28/2018.    Copy of ICM check sent to [name].   3 month ICM trend: 07/27/2018    1 Year ICM trend:       Rosalene Billings, RN 07/27/2018 5:15 PM

## 2018-08-10 LAB — CUP PACEART REMOTE DEVICE CHECK
Battery Remaining Longevity: 92 mo
Battery Remaining Percentage: 88 %
Battery Voltage: 3.05 V
Brady Statistic RV Percent Paced: 1.1 %
Date Time Interrogation Session: 20191007080022
HIGH POWER IMPEDANCE MEASURED VALUE: 46 Ohm
HighPow Impedance: 46 Ohm
Implantable Lead Model: 148
Implantable Lead Serial Number: 116740
Lead Channel Pacing Threshold Amplitude: 1.25 V
Lead Channel Sensing Intrinsic Amplitude: 12 mV
Lead Channel Setting Pacing Pulse Width: 0.5 ms
MDC IDC LEAD IMPLANT DT: 20020111
MDC IDC LEAD LOCATION: 753860
MDC IDC MSMT LEADCHNL RV IMPEDANCE VALUE: 610 Ohm
MDC IDC MSMT LEADCHNL RV PACING THRESHOLD PULSEWIDTH: 0.5 ms
MDC IDC PG IMPLANT DT: 20180503
MDC IDC PG SERIAL: 7421148
MDC IDC SET LEADCHNL RV PACING AMPLITUDE: 2.5 V
MDC IDC SET LEADCHNL RV SENSING SENSITIVITY: 0.5 mV

## 2018-08-14 ENCOUNTER — Ambulatory Visit (INDEPENDENT_AMBULATORY_CARE_PROVIDER_SITE_OTHER): Payer: BLUE CROSS/BLUE SHIELD | Admitting: *Deleted

## 2018-08-14 DIAGNOSIS — I4891 Unspecified atrial fibrillation: Secondary | ICD-10-CM | POA: Diagnosis not present

## 2018-08-14 DIAGNOSIS — Z5181 Encounter for therapeutic drug level monitoring: Secondary | ICD-10-CM | POA: Diagnosis not present

## 2018-08-14 LAB — POCT INR: INR: 2.4 (ref 2.0–3.0)

## 2018-08-14 NOTE — Patient Instructions (Signed)
Continue coumadin 1/2 tablet daily except 1 tablet on Sundays, Tuesdays and Thursdays. Recheck in 4 weeks 

## 2018-08-28 ENCOUNTER — Ambulatory Visit (INDEPENDENT_AMBULATORY_CARE_PROVIDER_SITE_OTHER): Payer: BLUE CROSS/BLUE SHIELD

## 2018-08-28 DIAGNOSIS — Z95810 Presence of automatic (implantable) cardiac defibrillator: Secondary | ICD-10-CM | POA: Diagnosis not present

## 2018-08-28 DIAGNOSIS — I5022 Chronic systolic (congestive) heart failure: Secondary | ICD-10-CM | POA: Diagnosis not present

## 2018-08-30 NOTE — Progress Notes (Signed)
EPIC Encounter for ICM Monitoring  Patient Name: [name] is a 67 y.o. male Date: 08/30/2018 Primary Care Physican: Rory Percy, MD Primary Cardiologist:McDowell Electrophysiologist:Allred Last Weight: 220.8lbs                                                    Transmission reviewed   Thoracic impedance normal.   Prescribed: Spironolactone 25 mg take 1 tablet daily.  Recommendations: None  Follow-up plan: ICM clinic phone appointment on 10/04/2018.    Copy of ICM check sent to [name].   3 month ICM trend: 08/28/2018    1 Year ICM trend:       Rosalene Billings, RN 08/30/2018 3:51 PM

## 2018-09-07 ENCOUNTER — Ambulatory Visit: Payer: BLUE CROSS/BLUE SHIELD | Admitting: Cardiology

## 2018-09-11 ENCOUNTER — Ambulatory Visit (INDEPENDENT_AMBULATORY_CARE_PROVIDER_SITE_OTHER): Payer: BLUE CROSS/BLUE SHIELD | Admitting: *Deleted

## 2018-09-11 DIAGNOSIS — Z5181 Encounter for therapeutic drug level monitoring: Secondary | ICD-10-CM

## 2018-09-11 DIAGNOSIS — I4891 Unspecified atrial fibrillation: Secondary | ICD-10-CM

## 2018-09-11 LAB — POCT INR: INR: 2.6 (ref 2.0–3.0)

## 2018-09-11 NOTE — Patient Instructions (Signed)
Continue coumadin 1/2 tablet daily except 1 tablet on Sundays, Tuesdays and Thursdays. Recheck in 4 weeks 

## 2018-09-26 ENCOUNTER — Other Ambulatory Visit: Payer: Self-pay | Admitting: Cardiology

## 2018-10-04 ENCOUNTER — Ambulatory Visit (INDEPENDENT_AMBULATORY_CARE_PROVIDER_SITE_OTHER): Payer: BLUE CROSS/BLUE SHIELD

## 2018-10-04 DIAGNOSIS — I255 Ischemic cardiomyopathy: Secondary | ICD-10-CM | POA: Diagnosis not present

## 2018-10-05 ENCOUNTER — Ambulatory Visit (INDEPENDENT_AMBULATORY_CARE_PROVIDER_SITE_OTHER): Payer: BLUE CROSS/BLUE SHIELD

## 2018-10-05 DIAGNOSIS — Z95810 Presence of automatic (implantable) cardiac defibrillator: Secondary | ICD-10-CM | POA: Diagnosis not present

## 2018-10-05 DIAGNOSIS — I5022 Chronic systolic (congestive) heart failure: Secondary | ICD-10-CM | POA: Diagnosis not present

## 2018-10-05 NOTE — Progress Notes (Signed)
Remote ICD transmission.   

## 2018-10-05 NOTE — Progress Notes (Signed)
EPIC Encounter for ICM Monitoring  Patient Name: [name] is a 68 y.o. male Date: 10/05/2018 Primary Care Physican: Rory Percy, MD Primary Cardiologist:McDowell Electrophysiologist:Allred Last Weight:220.8lbs   Transmission reviewed  Thoracic impedancenormal.   Prescribed:Spironolactone 25 mg take 1 tablet daily.  Recommendations:None  Follow-up plan: ICM clinic phone appointment on2/17/2020.   Copy of ICM check sent to Dr.Allred.   3 month ICM trend: 10/04/2018    1 Year ICM trend:       Rosalene Billings, RN 10/05/2018 11:02 AM

## 2018-10-06 LAB — CUP PACEART REMOTE DEVICE CHECK
Battery Remaining Longevity: 88 mo
Battery Remaining Percentage: 85 %
Battery Voltage: 3.02 V
Brady Statistic RV Percent Paced: 1 %
Date Time Interrogation Session: 20200116090943
HIGH POWER IMPEDANCE MEASURED VALUE: 47 Ohm
HighPow Impedance: 47 Ohm
Implantable Lead Implant Date: 20020111
Implantable Lead Location: 753860
Implantable Lead Serial Number: 116740
Lead Channel Impedance Value: 650 Ohm
Lead Channel Pacing Threshold Amplitude: 1.25 V
Lead Channel Pacing Threshold Pulse Width: 0.5 ms
Lead Channel Sensing Intrinsic Amplitude: 12 mV
Lead Channel Setting Sensing Sensitivity: 0.5 mV
MDC IDC PG IMPLANT DT: 20180503
MDC IDC PG SERIAL: 7421148
MDC IDC SET LEADCHNL RV PACING AMPLITUDE: 2.5 V
MDC IDC SET LEADCHNL RV PACING PULSEWIDTH: 0.5 ms

## 2018-10-09 ENCOUNTER — Ambulatory Visit (INDEPENDENT_AMBULATORY_CARE_PROVIDER_SITE_OTHER): Payer: BLUE CROSS/BLUE SHIELD | Admitting: Pharmacist

## 2018-10-09 DIAGNOSIS — I4891 Unspecified atrial fibrillation: Secondary | ICD-10-CM | POA: Diagnosis not present

## 2018-10-09 DIAGNOSIS — Z5181 Encounter for therapeutic drug level monitoring: Secondary | ICD-10-CM | POA: Diagnosis not present

## 2018-10-09 LAB — POCT INR: INR: 2.5 (ref 2.0–3.0)

## 2018-10-09 NOTE — Patient Instructions (Signed)
Description   Continue coumadin 1/2 tablet daily except 1 tablet on Sundays, Tuesdays and Thursdays Recheck in 4 weeks

## 2018-10-10 DIAGNOSIS — L570 Actinic keratosis: Secondary | ICD-10-CM | POA: Diagnosis not present

## 2018-10-10 DIAGNOSIS — B070 Plantar wart: Secondary | ICD-10-CM | POA: Diagnosis not present

## 2018-10-10 DIAGNOSIS — L820 Inflamed seborrheic keratosis: Secondary | ICD-10-CM | POA: Diagnosis not present

## 2018-10-10 DIAGNOSIS — D229 Melanocytic nevi, unspecified: Secondary | ICD-10-CM | POA: Diagnosis not present

## 2018-11-05 ENCOUNTER — Ambulatory Visit (INDEPENDENT_AMBULATORY_CARE_PROVIDER_SITE_OTHER): Payer: BLUE CROSS/BLUE SHIELD

## 2018-11-05 DIAGNOSIS — I5022 Chronic systolic (congestive) heart failure: Secondary | ICD-10-CM

## 2018-11-05 DIAGNOSIS — Z95810 Presence of automatic (implantable) cardiac defibrillator: Secondary | ICD-10-CM | POA: Diagnosis not present

## 2018-11-05 NOTE — Progress Notes (Signed)
EPIC Encounter for ICM Monitoring  Patient Name: [name] is a 68 y.o. male Date: 11/05/2018 Primary Care Physican: Rory Percy, MD Primary Cardiologist:McDowell Electrophysiologist:Allred Last Weight:220.8lbs   Attempted call to patient and unable to reach.  Left detailed message per DPR regarding transmission. Transmission reviewed.   Thoracic impedancenormal.   Prescribed:Spironolactone 25 mg take 1 tablet daily.  Recommendations:Left voice mail with ICM number and encouraged to call if experiencing any fluid symptoms.  Follow-up plan: ICM clinic phone appointment on3/23/2020. Office appointment 11/27/2018 with Dr Domenic Polite  Copy of ICM check sent to Dr.Allred.  3 month ICM trend: 11/05/2018    1 Year ICM trend:       Rosalene Billings, RN 11/05/2018 3:40 PM

## 2018-11-06 ENCOUNTER — Ambulatory Visit (INDEPENDENT_AMBULATORY_CARE_PROVIDER_SITE_OTHER): Payer: BLUE CROSS/BLUE SHIELD | Admitting: Pharmacist

## 2018-11-06 DIAGNOSIS — I4891 Unspecified atrial fibrillation: Secondary | ICD-10-CM | POA: Diagnosis not present

## 2018-11-06 DIAGNOSIS — Z5181 Encounter for therapeutic drug level monitoring: Secondary | ICD-10-CM

## 2018-11-06 LAB — POCT INR: INR: 1.9 — AB (ref 2.0–3.0)

## 2018-11-06 NOTE — Patient Instructions (Signed)
Description   Take 1.5 tablets today then continue coumadin 1/2 tablet daily except 1 tablet on Sundays, Tuesdays and Thursdays Recheck in 4 weeks

## 2018-11-07 DIAGNOSIS — Z6832 Body mass index (BMI) 32.0-32.9, adult: Secondary | ICD-10-CM | POA: Diagnosis not present

## 2018-11-07 DIAGNOSIS — J111 Influenza due to unidentified influenza virus with other respiratory manifestations: Secondary | ICD-10-CM | POA: Diagnosis not present

## 2018-11-08 DIAGNOSIS — I4891 Unspecified atrial fibrillation: Secondary | ICD-10-CM | POA: Diagnosis not present

## 2018-11-08 DIAGNOSIS — Z95810 Presence of automatic (implantable) cardiac defibrillator: Secondary | ICD-10-CM | POA: Diagnosis not present

## 2018-11-08 DIAGNOSIS — R0602 Shortness of breath: Secondary | ICD-10-CM | POA: Diagnosis not present

## 2018-11-08 DIAGNOSIS — I2510 Atherosclerotic heart disease of native coronary artery without angina pectoris: Secondary | ICD-10-CM | POA: Diagnosis not present

## 2018-11-08 DIAGNOSIS — R069 Unspecified abnormalities of breathing: Secondary | ICD-10-CM | POA: Diagnosis not present

## 2018-11-08 DIAGNOSIS — I252 Old myocardial infarction: Secondary | ICD-10-CM | POA: Diagnosis not present

## 2018-11-08 DIAGNOSIS — J181 Lobar pneumonia, unspecified organism: Secondary | ICD-10-CM | POA: Diagnosis not present

## 2018-11-08 DIAGNOSIS — E7800 Pure hypercholesterolemia, unspecified: Secondary | ICD-10-CM | POA: Diagnosis not present

## 2018-11-08 DIAGNOSIS — Z7982 Long term (current) use of aspirin: Secondary | ICD-10-CM | POA: Diagnosis not present

## 2018-11-08 DIAGNOSIS — J189 Pneumonia, unspecified organism: Secondary | ICD-10-CM | POA: Diagnosis not present

## 2018-11-08 DIAGNOSIS — Z79899 Other long term (current) drug therapy: Secondary | ICD-10-CM | POA: Diagnosis not present

## 2018-11-08 DIAGNOSIS — I959 Hypotension, unspecified: Secondary | ICD-10-CM | POA: Diagnosis not present

## 2018-11-08 DIAGNOSIS — Z9842 Cataract extraction status, left eye: Secondary | ICD-10-CM | POA: Diagnosis not present

## 2018-11-08 DIAGNOSIS — Z7901 Long term (current) use of anticoagulants: Secondary | ICD-10-CM | POA: Diagnosis not present

## 2018-11-08 DIAGNOSIS — N390 Urinary tract infection, site not specified: Secondary | ICD-10-CM | POA: Diagnosis not present

## 2018-11-08 DIAGNOSIS — R0902 Hypoxemia: Secondary | ICD-10-CM | POA: Diagnosis not present

## 2018-11-08 DIAGNOSIS — Z9841 Cataract extraction status, right eye: Secondary | ICD-10-CM | POA: Diagnosis not present

## 2018-11-08 DIAGNOSIS — I10 Essential (primary) hypertension: Secondary | ICD-10-CM | POA: Diagnosis not present

## 2018-11-08 DIAGNOSIS — R791 Abnormal coagulation profile: Secondary | ICD-10-CM | POA: Diagnosis not present

## 2018-11-14 ENCOUNTER — Ambulatory Visit (INDEPENDENT_AMBULATORY_CARE_PROVIDER_SITE_OTHER): Payer: BLUE CROSS/BLUE SHIELD | Admitting: *Deleted

## 2018-11-14 DIAGNOSIS — I4891 Unspecified atrial fibrillation: Secondary | ICD-10-CM

## 2018-11-14 DIAGNOSIS — Z5181 Encounter for therapeutic drug level monitoring: Secondary | ICD-10-CM | POA: Diagnosis not present

## 2018-11-14 LAB — POCT INR: INR: 1.5 — AB (ref 2.0–3.0)

## 2018-11-16 ENCOUNTER — Emergency Department (HOSPITAL_COMMUNITY): Payer: BLUE CROSS/BLUE SHIELD

## 2018-11-16 ENCOUNTER — Emergency Department (HOSPITAL_COMMUNITY)
Admission: EM | Admit: 2018-11-16 | Discharge: 2018-11-16 | Disposition: A | Discharge status: Home / Self Care | Payer: BLUE CROSS/BLUE SHIELD | Attending: Emergency Medicine | Admitting: Emergency Medicine

## 2018-11-16 ENCOUNTER — Encounter (HOSPITAL_COMMUNITY): Payer: Self-pay

## 2018-11-16 ENCOUNTER — Other Ambulatory Visit: Payer: Self-pay

## 2018-11-16 DIAGNOSIS — I4891 Unspecified atrial fibrillation: Secondary | ICD-10-CM | POA: Diagnosis not present

## 2018-11-16 DIAGNOSIS — I5022 Chronic systolic (congestive) heart failure: Secondary | ICD-10-CM | POA: Insufficient documentation

## 2018-11-16 DIAGNOSIS — Z6830 Body mass index (BMI) 30.0-30.9, adult: Secondary | ICD-10-CM | POA: Diagnosis not present

## 2018-11-16 DIAGNOSIS — I110 Hypertensive heart disease with heart failure: Secondary | ICD-10-CM | POA: Insufficient documentation

## 2018-11-16 DIAGNOSIS — Z8673 Personal history of transient ischemic attack (TIA), and cerebral infarction without residual deficits: Secondary | ICD-10-CM | POA: Diagnosis not present

## 2018-11-16 DIAGNOSIS — R042 Hemoptysis: Secondary | ICD-10-CM | POA: Insufficient documentation

## 2018-11-16 DIAGNOSIS — J189 Pneumonia, unspecified organism: Secondary | ICD-10-CM | POA: Diagnosis not present

## 2018-11-16 DIAGNOSIS — Z7901 Long term (current) use of anticoagulants: Secondary | ICD-10-CM | POA: Diagnosis not present

## 2018-11-16 DIAGNOSIS — J181 Lobar pneumonia, unspecified organism: Secondary | ICD-10-CM

## 2018-11-16 DIAGNOSIS — Z79899 Other long term (current) drug therapy: Secondary | ICD-10-CM | POA: Diagnosis not present

## 2018-11-16 LAB — CBC WITH DIFFERENTIAL/PLATELET
ABS IMMATURE GRANULOCYTES: 0.22 10*3/uL — AB (ref 0.00–0.07)
BASOS ABS: 0.1 10*3/uL (ref 0.0–0.1)
BASOS PCT: 1 %
EOS ABS: 0.1 10*3/uL (ref 0.0–0.5)
Eosinophils Relative: 1 %
HCT: 45.4 % (ref 39.0–52.0)
Hemoglobin: 14.6 g/dL (ref 13.0–17.0)
IMMATURE GRANULOCYTES: 2 %
Lymphocytes Relative: 20 %
Lymphs Abs: 1.8 10*3/uL (ref 0.7–4.0)
MCH: 30.5 pg (ref 26.0–34.0)
MCHC: 32.2 g/dL (ref 30.0–36.0)
MCV: 94.8 fL (ref 80.0–100.0)
MONOS PCT: 6 %
Monocytes Absolute: 0.5 10*3/uL (ref 0.1–1.0)
NEUTROS ABS: 6.3 10*3/uL (ref 1.7–7.7)
NEUTROS PCT: 70 %
NRBC: 0 % (ref 0.0–0.2)
PLATELETS: 300 10*3/uL (ref 150–400)
RBC: 4.79 MIL/uL (ref 4.22–5.81)
RDW: 12.7 % (ref 11.5–15.5)
WBC: 9 10*3/uL (ref 4.0–10.5)

## 2018-11-16 LAB — PROTIME-INR
INR: 2 — ABNORMAL HIGH (ref 0.8–1.2)
PROTHROMBIN TIME: 22.6 s — AB (ref 11.4–15.2)

## 2018-11-16 LAB — COMPREHENSIVE METABOLIC PANEL
ALBUMIN: 3 g/dL — AB (ref 3.5–5.0)
ALT: 48 U/L — AB (ref 0–44)
AST: 33 U/L (ref 15–41)
Alkaline Phosphatase: 98 U/L (ref 38–126)
Anion gap: 9 (ref 5–15)
BUN: 14 mg/dL (ref 8–23)
CHLORIDE: 98 mmol/L (ref 98–111)
CO2: 23 mmol/L (ref 22–32)
Calcium: 9.1 mg/dL (ref 8.9–10.3)
Creatinine, Ser: 1.21 mg/dL (ref 0.61–1.24)
GFR calc Af Amer: >60 mL/min (ref >60)
GFR calc non Af Amer: >60 mL/min (ref >60)
GLUCOSE: 107 mg/dL — AB (ref 70–99)
POTASSIUM: 4.4 mmol/L (ref 3.5–5.1)
SODIUM: 130 mmol/L — AB (ref 135–145)
Total Bilirubin: 1 mg/dL (ref 0.3–1.2)
Total Protein: 7.2 g/dL (ref 6.5–8.1)

## 2018-11-16 MED ORDER — IOHEXOL 300 MG/ML  SOLN
75.0000 mL | Freq: Once | INTRAMUSCULAR | Status: AC | PRN
  Administered 2018-11-16: 75 mL via INTRAVENOUS

## 2018-11-16 MED ORDER — HYDROCODONE-HOMATROPINE 5-1.5 MG/5ML PO SYRP: 5.0000 mL | ORAL_SOLUTION | ORAL | 0 refills | Status: DC | PRN

## 2018-11-16 MED ORDER — LEVOFLOXACIN 500 MG PO TABS
500.0000 mg | ORAL_TABLET | Freq: Once | ORAL | Status: AC
  Administered 2018-11-16: 500 mg via ORAL
  Filled 2018-11-16 (×2): qty 1

## 2018-11-16 MED ORDER — LEVOFLOXACIN 500 MG PO TABS: 500.0000 mg | ORAL_TABLET | Freq: Every day | ORAL | 0 refills | Status: DC

## 2018-11-16 MED ORDER — SODIUM CHLORIDE 0.9 % IV BOLUS
1000.0000 mL | Freq: Once | INTRAVENOUS | Status: AC
  Administered 2018-11-16: 1000 mL via INTRAVENOUS

## 2018-11-16 NOTE — ED Triage Notes (Signed)
Pt arrives POV as referral from PCP for eval of persistent cough w/ hemoptysis since last wedenesday. Pt was recently admitted at [facility] in [place] for PNA. DCd earlier this week, yesterday w/ persistent cough and worsening hemoptysis, seen in office this AM and was sent here for further eval.

## 2018-11-16 NOTE — ED Notes (Signed)
Patient walked on room air, SpO2 maintained in upper 90s.

## 2018-11-16 NOTE — ED Notes (Signed)
PT ambulatory and with no s/sx distress. Verbalized understanding of DC teaching and has no questions at this time

## 2018-11-16 NOTE — ED Provider Notes (Signed)
[facility] EMERGENCY DEPARTMENT Provider Note   CSN: [id number] Arrival date & time: 11/16/18  1309    History   Chief Complaint Chief Complaint  Patient presents with  . Hemoptysis    HPI [name] is a 68 y.o. male.     Patient sent over here from the office because he has been coughing up small amounts of blood.  He was recently diagnosed with pneumonia was in the hospital in the evening.  He has taken 5-day course of Zithromax and has a couple days left of his Augmentin.  The history is provided by the patient. No language interpreter was used.  Cough  Cough characteristics:  Productive Sputum characteristics:  Bloody Severity:  Mild Onset quality:  Gradual Timing:  Constant Progression:  Waxing and waning Chronicity:  Recurrent Smoker: no   Relieved by:  Nothing Worsened by:  Nothing Ineffective treatments:  None tried Associated symptoms: no chest pain, no eye discharge, no headaches and no rash     Past Medical History:  Diagnosis Date  . A-V fistula ([place])   . AICD (automatic cardioverter/defibrillator) present 2006   ICD  . Anterior myocardial infarction ([place])   . Chronic systolic heart failure ([place])   . Coronary atherosclerosis of native coronary artery    a. s/p BMS to LAD and OM1 b. patent stents by cath in 2013  . History of hiatal hernia   . Hypertension   . 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 Ischemic cardiomyopathy    LVEF 30%, status post ICD  . OSA on CPAP   . Paroxysmal atrial fibrillation (HCC)    On coumadin  . Pseudoaneurysm ([place])   . Stroke [facility])     Patient Active Problem List   Diagnosis Date Noted  . Abnormal nuclear stress test   . Atypical angina ([place])   . Chest pain 06/19/2018  . Encounter for therapeutic drug monitoring 11/13/2013  . Automatic implantable cardioverter-defibrillator in situ 04/14/2011  . Long term (current) use of anticoagulants 12/16/2010  . Chronic systolic heart failure  ([place]) 09/06/2010  . Mixed hyperlipidemia 12/04/2009  . Atrial fibrillation ([place]) 11/24/2009  . Cardiomyopathy, ischemic 08/05/2008  . Coronary atherosclerosis of native coronary artery 08/05/2008    Past Surgical History:  Procedure Laterality Date  . AICD implantation  2011   St Jude ICD implanted for primary prevention of sudden death  . CATARACT EXTRACTION W/PHACO Left 03/27/2017   Procedure: CATARACT EXTRACTION PHACO AND INTRAOCULAR LENS PLACEMENT (IOC);  Surgeon: [name], MD;  Location: [facility];  Service: Ophthalmology;  Laterality: Left;  CDE: 8.48  . CATARACT EXTRACTION W/PHACO Right 05/29/2017   Procedure: CATARACT EXTRACTION PHACO AND INTRAOCULAR LENS PLACEMENT (IOC);  Surgeon: [name], MD;  Location: [facility];  Service: Ophthalmology;  Laterality: Right;  CDE: 7.48  . EYE SURGERY    . ICD GENERATOR CHANGEOUT N/A 01/19/2017   SJM Ellipse VR ICD generator change by Dr [name]  . LAPAROSCOPY N/A 10/23/2017   Procedure: LAPAROSCOPY DIAGNOSTIC;  Surgeon: [name], MD;  Location: [place];  Service: General;  Laterality: N/A;  . LEFT HEART CATH AND CORONARY ANGIOGRAPHY N/A 06/21/2018   Procedure: LEFT HEART CATH AND CORONARY ANGIOGRAPHY;  Surgeon: [name], MD;  Location: [place] CV LAB;  Service: Cardiovascular;  Laterality: N/A;  . VENTRAL HERNIA REPAIR N/A 10/23/2017   Procedure: VENTRAL HERNIA REPAIR;  Surgeon: [name], MD;  Location: [facility];  Service: General;  Laterality: N/A;  Home Medications    Prior to Admission medications   Medication Sig Start Date End Date Taking? Authorizing Provider  amoxicillin-clavulanate (AUGMENTIN) 875-125 MG tablet Take 1 tablet by mouth 2 (two) times daily. 11/10/18  Yes [provider]  aspirin 81 MG tablet Take 81 mg by mouth daily.     Yes [provider]  bisoprolol (ZEBETA) 5 MG tablet TAKE 1 TABLET BY MOUTH EVERY DAY Patient taking differently: Take 5 mg by mouth at bedtime.  09/26/18  Yes  Satira Sark, MD  ENTRESTO 24-26 MG TAKE 1 TABLET BY MOUTH TWICE A DAY Patient taking differently: Take 1 tablet by mouth 2 (two) times daily.  07/18/18  Yes Satira Sark, MD  finasteride (PROSCAR) 5 MG tablet Take 5 mg by mouth Daily.  11/28/10  Yes [provider]  nitroGLYCERIN (NITROSTAT) 0.4 MG SL tablet Place 1 tablet (0.4 mg total) under the tongue every 5 (five) minutes as needed for chest pain. 05/08/18  Yes Satira Sark, MD  omega-3 acid ethyl esters (LOVAZA) 1 G capsule Take 2 g by mouth 2 (two) times daily.    Yes [provider]  rosuvastatin (CRESTOR) 20 MG tablet TAKE 1 TABLET (20 MG TOTAL) BY MOUTH DAILY. Patient taking differently: Take 20 mg by mouth at bedtime.  07/10/18  Yes Satira Sark, MD  spironolactone (ALDACTONE) 25 MG tablet TAKE 1 TABLET BY MOUTH DAILY Patient taking differently: Take 25 mg by mouth daily.  02/06/18  Yes Satira Sark, MD  warfarin (COUMADIN) 5 MG tablet TAKE 1 TABLET BY MOUTH DAILY EXCEPT 1/2 A TABLET ON MONDAYS AND FRIDAYS Patient taking differently: Take 2.5-5 mg by mouth See admin instructions. TAKE 2.5 MG (0.5 TAB) ON   MON,WED, FRI AND 5 MG (1 TAB) ALL OTHER DAYS. 10/30/17  Yes Satira Sark, MD  calcium carbonate (TUMS - DOSED IN MG ELEMENTAL CALCIUM) 500 MG chewable tablet Chew 1-2 tablets by mouth daily as needed for indigestion or heartburn.    [provider]  HYDROcodone-homatropine (HYCODAN) 5-1.5 MG/5ML syrup Take 5 mLs by mouth every 4 (four) hours as needed for cough. 11/16/18   Milton Ferguson, MD  levofloxacin (LEVAQUIN) 500 MG tablet Take 1 tablet (500 mg total) by mouth daily. X 7 days 11/16/18   Milton Ferguson, MD    Family History Family History  Problem Relation Age of Onset  . Cancer Mother   . Heart attack Father   . Heart disease Brother     Social History Social History   Tobacco Use  . Smoking status: Never Smoker  . Smokeless tobacco: Never Used  Substance Use  Topics  . Alcohol use: Yes    Alcohol/week: 0.0 standard drinks    Comment: occasional  . Drug use: No     Allergies   Patient has no known allergies.   Review of Systems Review of Systems  Constitutional: Negative for appetite change and fatigue.  HENT: Negative for congestion, ear discharge and sinus pressure.   Eyes: Negative for discharge.  Respiratory: Positive for cough.        Streaks of blood in his cough  Cardiovascular: Negative for chest pain.  Gastrointestinal: Negative for abdominal pain and diarrhea.  Genitourinary: Negative for frequency and hematuria.  Musculoskeletal: Negative for back pain.  Skin: Negative for rash.  Neurological: Negative for seizures and headaches.  Psychiatric/Behavioral: Negative for hallucinations.     Physical Exam Updated Vital Signs BP 95/70   Pulse 97  Temp 98.2 F (36.8 C) (Oral)   Resp (!) 34   Ht 6\' 1"  (1.854 m)   Wt 100.2 kg   SpO2 96%   BMI 29.16 kg/m   Physical Exam Vitals signs and nursing note reviewed.  Constitutional:      Appearance: He is well-developed.  HENT:     Head: Normocephalic.     Nose: Nose normal.  Eyes:     General: No scleral icterus.    Conjunctiva/sclera: Conjunctivae normal.  Neck:     Musculoskeletal: Neck supple.     Thyroid: No thyromegaly.  Cardiovascular:     Rate and Rhythm: Normal rate and regular rhythm.     Heart sounds: No murmur. No friction rub. No gallop.   Pulmonary:     Breath sounds: No stridor. Rales present. No wheezing.  Chest:     Chest wall: No tenderness.  Abdominal:     General: There is no distension.     Tenderness: There is no abdominal tenderness. There is no rebound.  Musculoskeletal: Normal range of motion.  Lymphadenopathy:     Cervical: No cervical adenopathy.  Skin:    Findings: No erythema or rash.  Neurological:     Mental Status: He is oriented to person, place, and time.     Motor: No abnormal muscle tone.     Coordination: Coordination  normal.  Psychiatric:        Behavior: Behavior normal.      ED Treatments / Results  Labs (all labs ordered are listed, but only abnormal results are displayed) Labs Reviewed  CBC WITH DIFFERENTIAL/PLATELET - Abnormal; Notable for the following components:      Result Value   Abs Immature Granulocytes 0.22 (*)    All other components within normal limits  COMPREHENSIVE METABOLIC PANEL - Abnormal; Notable for the following components:   Sodium 130 (*)    Glucose, Bld 107 (*)    Albumin 3.0 (*)    ALT 48 (*)    All other components within normal limits  PROTIME-INR - Abnormal; Notable for the following components:   Prothrombin Time 22.6 (*)    INR 2.0 (*)    All other components within normal limits    EKG EKG Interpretation  Date/Time:  Friday November 16 2018 15:35:50 EST Ventricular Rate:  130 PR Interval:    QRS Duration: 92 QT Interval:  326 QTC Calculation: 479 R Axis:   -52 Text Interpretation:  Atrial fibrillation with rapid ventricular response with premature ventricular or aberrantly conducted complexes Low voltage QRS Left anterior fascicular block Cannot rule out Inferior infarct (masked by fascicular block?) , age undetermined Cannot rule out Anteroseptal infarct , age undetermined Abnormal ECG Confirmed by Milton Ferguson [phone]) on 11/16/2018 5:11:46 PM   Radiology Dg Chest 2 View  Result Date: 11/16/2018 CLINICAL DATA:  Hemoptysis. EXAM: CHEST - 2 VIEW COMPARISON:  11/08/2018 FINDINGS: Right lower lobe airspace disease. No pleural effusion or pneumothorax. Stable cardiomediastinal silhouette. Single lead cardiac pacemaker. No acute osseous abnormality. IMPRESSION: Right lower lobe pneumonia. Electronically Signed   By: Kathreen Devoid   On: 11/16/2018 14:37   Ct Chest W Contrast  Result Date: 11/16/2018 CLINICAL DATA:  Evaluate pneumonia. EXAM: CT CHEST WITH CONTRAST TECHNIQUE: Multidetector CT imaging of the chest was performed during intravenous contrast  administration. CONTRAST:  24mL OMNIPAQUE IOHEXOL 300 MG/ML  SOLN COMPARISON:  Chest radiograph 11/16/2018 FINDINGS: Cardiovascular: Left chest wall ICD is noted with lead in the right ventricle. Normal heart  size. No pericardial effusion. Aortic atherosclerosis. Left main, LAD left circumflex and RCA coronary artery calcifications. Mediastinum/Nodes: Normal appearance of the thyroid gland. The trachea appears patent and is midline. Small hiatal hernia. Right paratracheal lymph node measures 1.2 cm. There is a subcarinal lymph node which measures 1.5 cm, image 76/3. No hilar adenopathy. Lungs/Pleura: No pleural effusion. There is diffuse airspace consolidation and ground-glass attenuation throughout the right lower lobe compatible with lobar pneumonia. Patchy area of subsegmental atelectasis versus scar noted within the lingula. Upper Abdomen: No acute abnormality identified within the abdomen. Large left kidney cysts identified. Musculoskeletal: Spondylosis identified. No acute or suspicious osseous finding. IMPRESSION: 1. Diffuse airspace consolidation and ground-glass attenuation within the right lower lobe compatible with lobar pneumonia. Follow-up imaging is advised to ensure complete resolution and to rule out underlying malignancy. 2. Prominent common non pathologically enlarged mediastinal lymph nodes are likely reactive. 3. Aortic atherosclerosis and multi vessel coronary artery atherosclerotic calcifications. 4. Small hiatal hernia. Electronically Signed   By: Kerby Moors M.D.   On: 11/16/2018 19:45    Procedures Procedures (including critical care time)  Medications Ordered in ED Medications  levofloxacin (LEVAQUIN) tablet 500 mg (has no administration in time range)  sodium chloride 0.9 % bolus 1,000 mL (0 mLs Intravenous Stopped 11/16/18 1919)  iohexol (OMNIPAQUE) 300 MG/ML solution 75 mL (75 mLs Intravenous Contrast Given 11/16/18 1930)     Initial Impression / Assessment and Plan / ED  Course  I have reviewed the triage vital signs and the nursing notes.  Pertinent labs & imaging results that were available during my care of the patient were reviewed by me and considered in my medical decision making (see chart for details).       Patient still has pneumonia seen on chest x-ray and CT.  We will start the patient on Levaquin.  He was not hypoxic or short of breath when he was ambulating.  He will take 2-1/2 mg of his Coumadin daily and get seen next week by his family doctor.  He is return if his symptoms get worse.  Patient normally has a blood pressure in the 90s.  Final Clinical Impressions(s) / ED Diagnoses   Final diagnoses:  Community acquired pneumonia of right middle lobe of lung [facility])    ED Discharge Orders         Ordered    levofloxacin (LEVAQUIN) 500 MG tablet  Daily     11/16/18 2046    HYDROcodone-homatropine (HYCODAN) 5-1.5 MG/5ML syrup  Every 4 hours PRN     11/16/18 2046           Milton Ferguson, MD 11/16/18 2050

## 2018-11-16 NOTE — Discharge Instructions (Signed)
Do not take your Coumadin tonight.  Start back on 2-1/2 mg a day tomorrow.  Follow-up with your doctor next week and also follow-up with your personal keep track of your Coumadin level

## 2018-11-20 ENCOUNTER — Ambulatory Visit (INDEPENDENT_AMBULATORY_CARE_PROVIDER_SITE_OTHER): Payer: BLUE CROSS/BLUE SHIELD | Admitting: *Deleted

## 2018-11-20 DIAGNOSIS — I4891 Unspecified atrial fibrillation: Secondary | ICD-10-CM | POA: Diagnosis not present

## 2018-11-20 DIAGNOSIS — Z5181 Encounter for therapeutic drug level monitoring: Secondary | ICD-10-CM

## 2018-11-20 LAB — POCT INR: INR: 2.5 (ref 2.0–3.0)

## 2018-11-20 NOTE — Patient Instructions (Signed)
Continue coumadin 1/2 tablet daily except 1 tablet on Sundays, Tuesdays and Thursdays.  Will finish Z pack Thursday Finished Augmentin today Recheck in 1 weeks

## 2018-11-26 NOTE — Progress Notes (Signed)
Cardiology Office Note  Date: 11/27/2018   ID: RICKARD KENNERLY, DOB 1951-04-29, MRN [id number]  PCP: Rory Percy, MD  Primary Cardiologist: Rozann Lesches, MD   Chief Complaint  Patient presents with  . Cardiomyopathy    History of Present Illness: [name] is a 68 y.o. male last seen in November 2019.  He presents for a routine visit. Recent records reviewed.  He was seen in the ER at [facility] in late February with cough and small amounts of hemoptysis.  He had been diagnosed with pneumonia prior to that and managed at [facility] with a course of Augmentin and Zithromax.  Chest CT obtained at that time demonstrated right lower lobe infiltrate consistent with pneumonia, enlarged mediastinal lymph nodes that were likely reactive, aortic atherosclerosis and coronary artery calcifications, small hiatal hernia.  He was treated with a course of Levaquin by Dr. Roderic Palau.  He comes in now reporting continued cough and shortness of breath, no fevers however.  He has lost weight compared to last November.  No orthopnea or PND, no progressive leg swelling.  He states that he feels weak and lacks stamina.  No wheezing.  No further hemoptysis.  I reviewed his lab work from prior ER visit at [facility] in late February.  At that time his renal function was normal, he had a normal white count, NT-proBNP was 2954.  I talked with his daughter Loree Fee on speaker phone during the early part of our visit today.  He remains on stable cardiac regimen as outlined below.  Past Medical History:  Diagnosis Date  . A-V fistula ([place])   . AICD (automatic cardioverter/defibrillator) present 2006   ICD  . Anterior myocardial infarction ([place])   . Chronic systolic heart failure ([place])   . Coronary atherosclerosis of native coronary artery    a. s/p BMS to LAD and OM1 b. patent stents by cath in 2013  . History of hiatal hernia   . Hypertension   . Inducible  ventricular tachycardia ([place])   . Ischemic cardiomyopathy    LVEF 30%, status post ICD  . OSA on CPAP   . Paroxysmal atrial fibrillation (H[facility])    On coumadin  . Pseudoaneurysm ([place])   . Stroke [facility])     Past Surgical History:  Procedure Laterality Date  . AICD implantation  2011   St Jude ICD implanted for primary prevention of sudden death  . CATARACT EXTRACTION W/PHACO Left 03/27/2017   Procedure: CATARACT EXTRACTION PHACO AND INTRAOCULAR LENS PLACEMENT (IOC);  Surgeon: [name], MD;  Location: [facility];  Service: Ophthalmology;  Laterality: Left;  CDE: 8.48  . CATARACT EXTRACTION W/PHACO Right 05/29/2017   Procedure: CATARACT EXTRACTION PHACO AND INTRAOCULAR LENS PLACEMENT (IOC);  Surgeon: [name], MD;  Location: [facility];  Service: Ophthalmology;  Laterality: Right;  CDE: 7.48  . EYE SURGERY    . ICD GENERATOR CHANGEOUT N/A 01/19/2017   SJM Ellipse VR ICD generator change by Dr [name]  . LAPAROSCOPY N/A 10/23/2017   Procedure: LAPAROSCOPY DIAGNOSTIC;  Surgeon: [name], MD;  Location: [place];  Service: General;  Laterality: N/A;  . LEFT HEART CATH AND CORONARY ANGIOGRAPHY N/A 06/21/2018   Procedure: LEFT HEART CATH AND CORONARY ANGIOGRAPHY;  Surgeon: [name], MD;  Location: [place] CV LAB;  Service: Cardiovascular;  Laterality: N/A;  . VENTRAL HERNIA REPAIR N/A 10/23/2017   Procedure: VENTRAL HERNIA REPAIR;  Surgeon: [name], MD;  Location: [facility]  OR;  Service: General;  Laterality: N/A;    Current Outpatient Medications  Medication Sig Dispense Refill  . aspirin 81 MG tablet Take 81 mg by mouth daily.      . bisoprolol (ZEBETA) 5 MG tablet TAKE 1 TABLET BY MOUTH EVERY DAY (Patient taking differently: Take 5 mg by mouth at bedtime. ) 90 tablet 2  . calcium carbonate (TUMS - DOSED IN MG ELEMENTAL CALCIUM) 500 MG chewable tablet Chew 1-2 tablets by mouth daily as needed for indigestion or heartburn.    [name] ENTRESTO 24-26 MG TAKE 1 TABLET BY MOUTH TWICE A DAY  (Patient taking differently: Take 1 tablet by mouth 2 (two) times daily. ) 180 tablet 3  . finasteride (PROSCAR) 5 MG tablet Take 5 mg by mouth Daily.     [name] HYDROcodone-homatropine (HYCODAN) 5-1.5 MG/5ML syrup Take 5 mLs by mouth every 4 (four) hours as needed for cough. 60 mL 0  . nitroGLYCERIN (NITROSTAT) 0.4 MG SL tablet Place 1 tablet (0.4 mg total) under the tongue every 5 (five) minutes as needed for chest pain. 25 tablet 0  . omega-3 acid ethyl esters (LOVAZA) 1 G capsule Take 2 g by mouth 2 (two) times daily.     . rosuvastatin (CRESTOR) 20 MG tablet TAKE 1 TABLET (20 MG TOTAL) BY MOUTH DAILY. (Patient taking differently: Take 20 mg by mouth at bedtime. ) 90 tablet 2  . spironolactone (ALDACTONE) 25 MG tablet TAKE 1 TABLET BY MOUTH DAILY (Patient taking differently: Take 25 mg by mouth daily. ) 90 tablet 3  . warfarin (COUMADIN) 5 MG tablet Take 5 mg by mouth daily. Sun 5 mg - Mon 2 1/2 mg T 5 mg W 2 1/2 mg TH 5 mg F 2 1/2 mg     No current facility-administered medications for this visit.    Allergies:  Patient has no known allergies.   Social History: The patient  reports that he has never smoked. He has never used smokeless tobacco. He reports current alcohol use. He reports that he does not use drugs.   ROS:  Please see the history of present illness. Otherwise, complete review of systems is positive for none.  All other systems are reviewed and negative.   Physical Exam: VS:  BP (!) 100/58   Pulse 67   Ht 6\' 1"  (1.854 m)   Wt 221 lb (100.2 kg)   SpO2 96%   BMI 29.16 kg/m , BMI Body mass index is 29.16 kg/m.  Wt Readings from Last 3 Encounters:  11/27/18 221 lb (100.2 kg)  11/16/18 221 lb (100.2 kg)  07/24/18 227 lb (103 kg)    General: Patient is in no distress.  Coughing intermittently. HEENT: Conjunctiva and lids normal, oropharynx clear. Neck: Supple, no elevated JVP or carotid bruits, no thyromegaly. Lungs: Egophony right midlung zone, no wheezing. Cardiac:  Irregularly irregular, no S3 or significant systolic murmur, no pericardial rub. Abdomen: Soft, nontender, bowel sounds present. Extremities: No pitting edema, distal pulses 2+. Skin: Warm and dry. Musculoskeletal: No kyphosis. Neuropsychiatric: Alert and oriented x3, affect grossly appropriate.  ECG: I personally reviewed the tracing from 11/16/2018 which showed atrial fibrillation with PVC, low voltage and left anterior fascicular block, old anterior infarct pattern, nonspecific T wave changes.  Recent Labwork: 06/19/2018: B Natriuretic Peptide 362.7; TSH 0.564 11/16/2018: ALT 48; AST 33; BUN 14; Creatinine, Ser 1.21; Hemoglobin 14.6; Platelets 300; Potassium 4.4; Sodium 130   Other Studies Reviewed Today:  Chest CT 11/16/2018: IMPRESSION: 1. Diffuse  airspace consolidation and ground-glass attenuation within the right lower lobe compatible with lobar pneumonia. Follow-up imaging is advised to ensure complete resolution and to rule out underlying malignancy. 2. Prominent common non pathologically enlarged mediastinal lymph nodes are likely reactive. 3. Aortic atherosclerosis and multi vessel coronary artery atherosclerotic calcifications. 4. Small hiatal hernia.  Cardiac catheterization 06/21/2018:  There is severe left ventricular systolic dysfunction. The left ventricular ejection fraction is 25-35% by visual estimate.  LV end diastolic pressure is normal.  Minimal disease with underlying two-vessel disease involving LAD and circumflex:  Ost Cx to Prox Cx stent is 5% stenosed.  Prox LAD stent is 10% stenosed.  Mid Cx lesion is 40% stenosed.   Angiographically minimal disease with widely patent stents in the circumflex and LAD. Severely reduced LVEF of 30% with regional wall motion normality in the anterior and inferior apex with swirling of flow. Normal LVEDP  Echocardiogram 05/22/2018: Study Conclusions  - Left ventricle: The cavity size was normal. Wall thickness was    normal. Systolic function was moderately to severely reduced. The   estimated ejection fraction was in the range of 30% to 35%. The   study is not technically sufficient to allow evaluation of LV   diastolic function. - Regional wall motion abnormality: Akinesis of the mid   anteroseptal, apical septal, apical lateral, and apical   myocardium. - Aortic valve: Valve area (VTI): 3.57 cm^2. Valve area (Vmax):   3.44 cm^2. Valve area (Vmean): 3.47 cm^2. - Left atrium: The atrium was severely dilated. - Technically adequate study.  Assessment and Plan:  1.  Recent diagnosis of right lower lobe pneumonia.  He is now status post treatment with Augmentin and azithromycin followed by a course of Levaquin.  Chest CT done in late February also suspicious for pneumonia with associated reactive lymph nodes.  He does not report fevers but continues to cough and remain short of breath with lack of stamina.  I have recommended that he follow-up with Dr. Nadara Mustard soon for reassessment.  Question whether he may need inpatient course of IV antibiotics, or possibly evaluation by Pulmonary.  2.  Chronic systolic heart failure with ischemic cardiomyopathy and LVEF 30 to 35%.  Although his anti-proBNP was elevated at ER visit in February with diagnosis of pneumonia, his weight is actually down and I do not suspect a major component of fluid overload.  We will plan to repeat BMET and BNP but otherwise continue with his current medications.  Vital signs are otherwise stable today.  2.  Paroxysmal atrial fibrillation.  He continues on Coumadin.  He has been in atrial fibrillation most recently in the setting of pneumonia, heart rates are somewhat higher than typical baseline but no sustained rapid ventricular response.  Continue with current dose of Zebeta.  4.  St. Jude ICD in place.  He denies any recent device shocks or syncope.  Follows with [name].  Current medicines were reviewed with the patient  today.   Orders Placed This Encounter  Procedures  . Basic metabolic panel  . Brain natriuretic peptide    Disposition: Follow-up in the next 6 weeks.  Signed, Satira Sark, MD, [facility] 11/27/2018 9:57 AM    [facility] [place], [place], [place] [address] Phone: [phone]; Fax: [phone]

## 2018-11-27 ENCOUNTER — Telehealth: Payer: Self-pay | Admitting: *Deleted

## 2018-11-27 ENCOUNTER — Ambulatory Visit: Payer: BLUE CROSS/BLUE SHIELD | Admitting: Cardiology

## 2018-11-27 ENCOUNTER — Encounter: Payer: Self-pay | Admitting: Cardiology

## 2018-11-27 ENCOUNTER — Ambulatory Visit (INDEPENDENT_AMBULATORY_CARE_PROVIDER_SITE_OTHER): Payer: BLUE CROSS/BLUE SHIELD | Admitting: *Deleted

## 2018-11-27 VITALS — BP 100/58 | HR 67 | Ht 73.0 in | Wt 221.0 lb

## 2018-11-27 DIAGNOSIS — I480 Paroxysmal atrial fibrillation: Secondary | ICD-10-CM

## 2018-11-27 DIAGNOSIS — Z5181 Encounter for therapeutic drug level monitoring: Secondary | ICD-10-CM

## 2018-11-27 DIAGNOSIS — Z95810 Presence of automatic (implantable) cardiac defibrillator: Secondary | ICD-10-CM | POA: Diagnosis not present

## 2018-11-27 DIAGNOSIS — I4891 Unspecified atrial fibrillation: Secondary | ICD-10-CM

## 2018-11-27 DIAGNOSIS — J189 Pneumonia, unspecified organism: Secondary | ICD-10-CM

## 2018-11-27 DIAGNOSIS — I255 Ischemic cardiomyopathy: Secondary | ICD-10-CM | POA: Diagnosis not present

## 2018-11-27 DIAGNOSIS — I5022 Chronic systolic (congestive) heart failure: Secondary | ICD-10-CM

## 2018-11-27 LAB — POCT INR: INR: 3.3 — AB (ref 2.0–3.0)

## 2018-11-27 NOTE — Telephone Encounter (Signed)
Per Domenic Polite, patient need a pulmonology referral for persistent pnuemonia.

## 2018-11-27 NOTE — Patient Instructions (Addendum)
Medication Instructions:   Your physician recommends that you continue on your current medications as directed. Please refer to the Current Medication list given to you today.  Labwork:  Your physician recommends that you return for lab work in: this week to check your BMET & BNP.  Testing/Procedures:  NONE  Follow-Up:  Your physician recommends that you schedule a follow-up appointment in: 6 weeks.  Any Other Special Instructions Will Be Listed Below (If Applicable).  Please contact Dr. Lyman Speller office for a follow up appointment.  If you need a refill on your cardiac medications before your next appointment, please call your pharmacy.

## 2018-11-27 NOTE — Telephone Encounter (Signed)
Patient informed and is awaiting call back regarding pulmonology asap referral.

## 2018-11-27 NOTE — Patient Instructions (Signed)
Take coumadin 1/2 tablet tonight then continue 1 tablet on Sundays, Tuesdays and Thursdays.   Recheck in 2 weeks

## 2018-11-27 NOTE — Telephone Encounter (Signed)
-----   Message from Satira Sark, MD sent at 11/27/2018  1:45 PM EDT ----- Results reviewed.  Please let him know kidney function and potassium are normal.  NT-proBNP was 679, but this was reduced from prior check in late February.  Weight is down and I do not suspect significant fluid overload, so would not increase diuretics at this point. A copy of this test should be forwarded to Rory Percy, MD.

## 2018-11-27 NOTE — Telephone Encounter (Signed)
Patient informed. Copy sent to PCP °

## 2018-11-28 ENCOUNTER — Ambulatory Visit (INDEPENDENT_AMBULATORY_CARE_PROVIDER_SITE_OTHER)
Admission: RE | Admit: 2018-11-28 | Discharge: 2018-11-28 | Disposition: A | Discharge status: Home / Self Care | Payer: BLUE CROSS/BLUE SHIELD | Source: Ambulatory Visit | Attending: Internal Medicine | Admitting: Internal Medicine

## 2018-11-28 ENCOUNTER — Ambulatory Visit: Payer: BLUE CROSS/BLUE SHIELD | Admitting: Internal Medicine

## 2018-11-28 ENCOUNTER — Other Ambulatory Visit: Payer: Self-pay

## 2018-11-28 ENCOUNTER — Encounter: Payer: Self-pay | Admitting: Internal Medicine

## 2018-11-28 VITALS — BP 88/54 | HR 94 | Ht 73.0 in | Wt 219.0 lb

## 2018-11-28 DIAGNOSIS — R0609 Other forms of dyspnea: Secondary | ICD-10-CM

## 2018-11-28 DIAGNOSIS — J181 Lobar pneumonia, unspecified organism: Secondary | ICD-10-CM

## 2018-11-28 DIAGNOSIS — R05 Cough: Secondary | ICD-10-CM | POA: Diagnosis not present

## 2018-11-28 DIAGNOSIS — J189 Pneumonia, unspecified organism: Secondary | ICD-10-CM | POA: Insufficient documentation

## 2018-11-28 LAB — SEDIMENTATION RATE: Sed Rate: 35 mm/hr — ABNORMAL HIGH (ref 0–20)

## 2018-11-28 LAB — CBC WITH DIFFERENTIAL/PLATELET
BASOS ABS: 0.1 10*3/uL (ref 0.0–0.1)
Basophils Relative: 1.2 % (ref 0.0–3.0)
Eosinophils Absolute: 0.1 10*3/uL (ref 0.0–0.7)
Eosinophils Relative: 1.5 % (ref 0.0–5.0)
HCT: 46 % (ref 39.0–52.0)
Hemoglobin: 15.4 g/dL (ref 13.0–17.0)
Lymphocytes Relative: 28.9 % (ref 12.0–46.0)
Lymphs Abs: 2 10*3/uL (ref 0.7–4.0)
MCHC: 33.5 g/dL (ref 30.0–36.0)
MCV: 93.9 fl (ref 78.0–100.0)
Monocytes Absolute: 0.6 10*3/uL (ref 0.1–1.0)
Monocytes Relative: 8.1 % (ref 3.0–12.0)
Neutro Abs: 4.2 10*3/uL (ref 1.4–7.7)
Neutrophils Relative %: 60.3 % (ref 43.0–77.0)
PLATELETS: 304 10*3/uL (ref 150.0–400.0)
RBC: 4.9 Mil/uL (ref 4.22–5.81)
RDW: 13.8 % (ref 11.5–15.5)
WBC: 7.1 10*3/uL (ref 4.0–10.5)

## 2018-11-28 LAB — BASIC METABOLIC PANEL
BUN: 20 mg/dL (ref 6–23)
CALCIUM: 9.3 mg/dL (ref 8.4–10.5)
CO2: 28 mEq/L (ref 19–32)
Chloride: 100 mEq/L (ref 96–112)
Creatinine, Ser: 1.21 mg/dL (ref 0.40–1.50)
GFR: 59.52 mL/min — ABNORMAL LOW (ref >60.00)
Glucose, Bld: 99 mg/dL (ref 70–99)
Potassium: 4.4 mEq/L (ref 3.5–5.1)
Sodium: 134 mEq/L — ABNORMAL LOW (ref 135–145)

## 2018-11-28 LAB — BRAIN NATRIURETIC PEPTIDE: Pro B Natriuretic peptide (BNP): 209 pg/mL — ABNORMAL HIGH (ref 0.0–100.0)

## 2018-11-28 MED ORDER — ACETAMINOPHEN-CODEINE #3 300-30 MG PO TABS: 1.0000 | ORAL_TABLET | ORAL | 0 refills | Status: AC | PRN

## 2018-11-28 NOTE — Progress Notes (Signed)
[name], male    DOB: April 24, 1951, 68 y.o.   MRN: [id number]   Brief patient profile:  18 yowm never smoker healthy x for IHD onset age 72 never coughed from acei but placed on Entresto  01/23/18 and no symptoms until  acute fever around 11/06/2018  Assoc with  cough with some blood while maint on coumadin, midline to R ant cp and vomiting with cough > admitted to [name] with RLL pna rx x 2 days and d/c augmentin > no fever, still some cough with blood /yellow and resolved cp and seen in ER 11/16/18 and rx levaquin 500 mg x one week and less cough referred to pulmonary clinic 11/28/2018 by Dr   [name].   History of Present Illness  11/28/2018  Pulmonary/ 1st office eval/[name]  Chief Complaint  Patient presents with   Pulmonary Consult    Referred by Dr. Rozann Lesches for eval of persistant PNA. He c/o cough and DOE. His cough is occ prod with clear sputum.   Dyspnea:  Across parking lot = [facility] = can't walk 100 yards even at a slow pace at a flat grade s stopping due to sob   Cough: now with clear mucus / wakes up without cough then worse with activity  Sleep: on side bed flat  SABA use: non inhalers  Diarrhea since taking levaquin but no abd pain   No obvious day to day or daytime variability or assoc ongoing  purulent sputum or mucus plugs or hemoptysis or cp or chest tightness, subjective wheeze or overt sinus or hb symptoms.   Sleeping fine now without nocturnal  or early am exacerbation  of respiratory  c/o's or need for noct saba. Also denies any obvious fluctuation of symptoms with weather or environmental changes or other aggravating or alleviating factors except as outlined above   No unusual exposure hx or h/o childhood pna/ asthma or knowledge of premature birth.  Current Allergies, Complete Past Medical History, Past Surgical History, Family History, and Social History were reviewed in [employer] record.  ROS  The following are not  active complaints unless bolded Hoarseness, sore throat, dysphagia, dental problems, itching, sneezing,  nasal congestion or discharge of excess mucus or purulent secretions, ear ache,   fever, chills, sweats, unintended wt loss or wt gain, classically pleuritic or exertional cp,  orthopnea pnd or arm/hand swelling  or leg swelling, presyncope, palpitations, abdominal pain, anorexia, nausea, vomiting, diarrhea  or change in bowel habits or change in bladder habits, change in stools or change in urine, dysuria, hematuria,  rash, arthralgias, visual complaints, headache, numbness, weakness or ataxia or problems with walking or coordination,  change in mood or  memory.               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 s/p BMS to LAD and OM1 b. 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[name])    Ischemic cardiomyopathy    LVEF 30%, status post ICD   OSA on CPAP    Paroxysmal atrial fibrillation ([name])    On coumadin   Pseudoaneurysm ([name])    Stroke ([name])  Outpatient Medications Prior to Visit  Medication Sig Dispense Refill   aspirin 81 MG tablet Take 81 mg by mouth daily.       bisoprolol (ZEBETA) 5 MG tablet TAKE 1 TABLET BY MOUTH EVERY DAY (Patient taking differently: Take 5 mg by mouth at bedtime. ) 90 tablet 2   calcium carbonate (TUMS - DOSED IN MG ELEMENTAL CALCIUM) 500 MG chewable tablet Chew 1-2 tablets by mouth daily as needed for indigestion or heartburn.     ENTRESTO 24-26 MG TAKE 1 TABLET BY MOUTH TWICE A DAY (Patient taking differently: Take 1 tablet by mouth 2 (two) times daily. ) 180 tablet 3   finasteride (PROSCAR) 5 MG tablet Take 5 mg by mouth Daily.       nitroGLYCERIN (NITROSTAT) 0.4 MG SL tablet Place 1 tablet (0.4 mg total) under the tongue every 5 (five) minutes as needed for chest pain. 25 tablet 0   omega-3 acid ethyl esters (LOVAZA) 1 G capsule Take 2 g by mouth 2 (two) times daily.      rosuvastatin (CRESTOR) 20 MG tablet TAKE 1 TABLET (20 MG TOTAL) BY MOUTH DAILY. (Patient taking differently: Take 20 mg by mouth at bedtime. ) 90 tablet 2   spironolactone (ALDACTONE) 25 MG tablet TAKE 1 TABLET BY MOUTH DAILY (Patient taking differently: Take 25 mg by mouth daily. ) 90 tablet 3   warfarin (COUMADIN) 5 MG tablet Take 5 mg by mouth daily. Sun 5 mg - Mon 2 1/2 mg T 5 mg W 2 1/2 mg TH 5 mg F 2 1/2 mg     HYDROcodone-homatropine (HYCODAN) 5-1.5 MG/5ML syrup Take 5 mLs by mouth every 4 (four) hours as needed for cough. 60 mL 0      Objective:     BP (!) 88/54 (BP Location: Left Arm, Cuff Size: Normal)    Pulse 94    Ht '6\' 1"'  (1.854 m)    Wt 219 lb (99.3 kg)    SpO2 95%    BMI 28.89 kg/m   SpO2: 95 % RA   amb wm nad   HEENT: nl dentition, turbinates bilaterally, and oropharynx. Nl external ear canals without cough reflex   NECK :  without JVD/Nodes/TM/ nl carotid upstrokes bilaterally   LUNGS: no acc muscle use,  Nl contour chest with minimal insp/exp rhonchi R base s egophony/bronchial changes  bilaterally without cough on insp or exp maneuvers    CV:  RRR  no s3 or murmur or increase in P2, and no edema   ABD:  soft and nontender with nl inspiratory excursion in the supine position. No bruits or organomegaly appreciated, bowel sounds nl  MS:  Nl gait/ ext warm without deformities, calf tenderness, cyanosis or clubbing No obvious joint restrictions   SKIN: warm and dry without lesions    NEURO:  alert, approp, nl sensorium with  no motor or cerebellar deficits apparent.     CXR PA and Lateral:   11/28/2018 :    I personally reviewed images and agree with radiology impression as follows:    Stable right lower lobe  pneumonia. My impression:  Much better esp on later view from study in canopy dated 11/08/2018     Labs ordered/ reviewed:      Chemistry      Component Value Date/Time   NA 134 (L) 11/28/2018 1215   NA 140 01/11/2017 1118   K 4.4 11/28/2018 1215   CL 100 11/28/2018 1215   CO2 28 11/28/2018  1215   BUN 20 11/28/2018 1215   BUN 19 01/11/2017 1118   CREATININE 1.21 11/28/2018 1215   CREATININE 1.14 02/14/2018 1027      Component Value Date/Time   CALCIUM 9.3 11/28/2018 1215   ALKPHOS 98 11/16/2018 1344   AST 33 11/16/2018 1344   ALT 48 (H) 11/16/2018 1344   BILITOT 1.0 11/16/2018 1344        Lab Results  Component Value Date   WBC 7.1 11/28/2018   HGB 15.4 11/28/2018   HCT 46.0 11/28/2018   MCV 93.9 11/28/2018   PLT 304.0 11/28/2018       EOS                                                               0.1                                    11/28/2018       Lab Results  Component Value Date   TSH 0.564 06/19/2018     Lab Results  Component Value Date   PROBNP 209.0 (H) 11/28/2018       Lab Results  Component Value Date   ESRSEDRATE 35 (H) 11/28/2018           Assessment   DOE (dyspnea on exertion) Onset acute on chronic with RLL pna 2//18/2020  He does still have significant as dz in RLL and h/o systolic chf with bnp in intermediate range but no evidence of anemia, renal dz or thyroid dz based on today's labs or superimposed PE unlikely on coumadin and  the problem should gradually resolve s intervention/escalation of care  at this point.      CAP (community acquired pneumonia) Clinical onset 11/06/2018  R LL sup segment (which has improved quite a bit if one looks at the lateral baseline film 11/08/18 vs the one obtained 11/28/2018  - rx augmentin then levaquin - ESR 11/28/2018 = 35   Clinically this is a classic cap/ lobar pna with radiographic lag though may have an element of orgnanizing pna (inflammatory) at this point  and if not continuing to  improve will add short course pred o/w f/u in 2 weeks with repeat film.   In meantime measures to control cyclical coughing need to be implemented : - Of the three most common causes of  Sub-acute / recurrent or chronic cough, only one (GERD)  can actually contribute to/ trigger  the other two (asthma and post nasal drip syndrome)  and perpetuate the cylce of cough.  While not intuitively obvious, many patients with chronic low grade reflux do not cough until there is a primary insult that disturbs the protective epithelial barrier and exposes sensitive nerve endings.   This is typically viral but can due to CAP as was initially the case here  >>> The point is that once this occurs, it is difficult to eliminate the cycle  using anything but a maximally effective acid suppression regimen at least in the short run, accompanied by an appropriate diet to address non acid GERD and control / eliminate the cough itself for at least 3 days with tyl #3    Discussed in detail all  the  indications, usual  risks and alternatives  relative to the benefits with patient who agrees to proceed with rx as outlined.          Total time devoted to counseling  > 50 % of initial 60 min office visit:  review case with pt/ discussion of options/alternatives/ personally creating written customized instructions  in presence of pt  then going over those specific  Instructions directly with the pt including how to use all of the meds but in particular covering each Marcus medication in detail and the difference between the maintenance= "automatic" meds and the prns using an action plan format for the latter (If this problem/symptom => do that organization reading Left to right).  Please see AVS from this visit for a full list of these instructions which I personally wrote for this pt and  are unique to this visit.      Christinia Gully, MD 11/28/2018

## 2018-11-28 NOTE — Patient Instructions (Addendum)
Stop lovaza (fish oil)     Try prilosec otc 20mg   Take 30-60 min before first meal of the day and Pepcid ac (famotidine) 20 mg one @  bedtime until cough is completely gone for at least a week without the need for cough suppression     GERD (REFLUX)  is an extremely common cause of respiratory symptoms just like yours , many times with no obvious heartburn at all.    It can be treated with medication, but also with lifestyle changes including elevation of the head of your bed (ideally with 6 -8inch blocks under the headboard of your bed),  Smoking cessation, avoidance of late meals, excessive alcohol, and avoid fatty foods, chocolate, peppermint, colas, red wine, and acidic juices such as orange juice.  NO MINT OR MENTHOL PRODUCTS SO NO COUGH DROPS  USE SUGARLESS CANDY INSTEAD (Jolley ranchers or Stover's or Life Savers) or even ice chips will also do - the key is to swallow to prevent all throat clearing. NO OIL BASED VITAMINS - use powdered substitutes.  Avoid fish oil when coughing.   Take delsym two tsp every 12 hours and supplement if needed with  Tylenol #3  up to 1every 4 hours to suppress the urge to cough. Swallowing water and/or using ice chips/non mint and menthol containing candies (such as lifesavers or sugarless jolly ranchers) are also effective.  You should rest your voice and avoid activities that you know make you cough.  Once you have eliminated the cough for 3 straight days try reducing the Tylenol # 3 first,  then the delsym as tolerated.    Please remember to go to the lab and x-ray department   for your tests - we will call you with the results when they are available.     Please schedule a follow up office visit in 2 weeks, sooner if needed

## 2018-11-29 ENCOUNTER — Encounter: Payer: Self-pay | Admitting: Internal Medicine

## 2018-11-29 NOTE — Assessment & Plan Note (Addendum)
Clinical onset 11/06/2018  R LL sup segment (which has improved quite a bit if one looks at the lateral baseline film 11/08/18 vs the one obtained 11/28/2018  - rx augmentin then levaquin - ESR 11/28/2018 = 35   Clinically this is a classic cap/ lobar pna with radiographic lag though may have an element of orgnanizing pna (inflammatory) at this and if not continuing to improve will add short course pred o/w f/u in 2 weeks with repeat film.   In meantime measures to control cyclical coughing need to be implemented  - Of the three most common causes of  Sub-acute / recurrent or chronic cough, only one (GERD)  can actually contribute to/ trigger  the other two (asthma and post nasal drip syndrome)  and perpetuate the cylce of cough.  While not intuitively obvious, many patients with chronic low grade reflux do not cough until there is a primary insult that disturbs the protective epithelial barrier and exposes sensitive nerve endings.   This is typically viral   but can due to CAP as was initially the case here  .   The point is that once this occurs, it is difficult to eliminate the cycle  using anything but a maximally effective acid suppression regimen at least in the short run, accompanied by an appropriate diet to address non acid GERD and control / eliminate the cough itself for at least 3 days with Tylenol #3   Discussed in detail all the  indications, usual  risks and alternatives  relative to the benefits with patient who agrees to proceed with rx as outlined.      Total time devoted to counseling  > 50 % of initial 60 min office visit:  review case with pt/ discussion of options/alternatives/ personally creating written customized instructions  in presence of pt  then going over those specific  Instructions directly with the pt including how to use all of the meds but in particular covering each new medication in detail and the difference between the maintenance= "automatic" meds and the prns  using an action plan format for the latter (If this problem/symptom => do that organization reading Left to right).  Please see AVS from this visit for a full list of these instructions which I personally wrote for this pt and  are unique to this visit.

## 2018-11-29 NOTE — Assessment & Plan Note (Addendum)
Onset acute on chronic with RLL pna 2//18/2020   He does still have significant as dz in RLL and h/o systolic chf with bnp in intermediate range but no evidence of anemia, renal dz or thyroid dz based on today's labs or superimposed PE unlikely on coumadin and  the problem should gradually resolve s intervention/escalation of care  at this point.

## 2018-12-10 ENCOUNTER — Ambulatory Visit (INDEPENDENT_AMBULATORY_CARE_PROVIDER_SITE_OTHER): Payer: BLUE CROSS/BLUE SHIELD

## 2018-12-10 ENCOUNTER — Other Ambulatory Visit: Payer: Self-pay

## 2018-12-10 ENCOUNTER — Telehealth: Payer: Self-pay | Admitting: Pharmacist

## 2018-12-10 DIAGNOSIS — Z95810 Presence of automatic (implantable) cardiac defibrillator: Secondary | ICD-10-CM

## 2018-12-10 DIAGNOSIS — I5022 Chronic systolic (congestive) heart failure: Secondary | ICD-10-CM

## 2018-12-10 NOTE — Telephone Encounter (Signed)
1. Do you currently have a fever? No 2. Have you recently travelled on a cruise, internationally, or to [place], [place], [place], [place], [place], or [place], [place] ([employer]) ? No 3. Have you been in contact with someone that is currently pending confirmation of Covid19 testing or has been confirmed to have the Covid19 virus?  No 4. Are you currently experiencing fatigue or cough? No  Pt. Advised that we are restricting visitors at this time and anyone present in the vehicle should meet the above criteria as well. Advised that visit will be at curbside for finger stick ONLY and will receive call with instructions. 

## 2018-12-10 NOTE — Progress Notes (Signed)
EPIC Encounter for ICM Monitoring  Patient Name: [name] is a 68 y.o. male Date: 12/10/2018 Primary Care Physican: Rory Percy, MD Primary Cardiologist:[place] Electrophysiologist:Allred 11/27/2018 Weight:221lbs   Spoke with patient.  Heart failure questions reviewed.  He reports he was very [place] of breath prior to be being hospitalized 2/28 for pneumonia which correlates with decreased impedance 2/20 - 2/26.   He has follow up appt with NP Anders Simmonds in [facility] office, pulmonologist on 12/14/2018.    Thoracic impedanceabnormal suggesting fluid accumulation starting 12/01/2018 but trending close to baseline.   Prescribed:Spironolactone 25 mg take 1 tablet daily.  Recommendations:He will let pulmonologist know that the device is showing a little bit of fluid and will call if he has reoccurence of fluid symptoms.   Follow-up plan: ICM clinic phone appointment on4/03/2019 to recheck fluid levels.   Copy of ICM check sent to Dr.Allred and Dr Domenic Polite  3 month ICM trend: 12/10/2018    1 Year ICM trend:       Rosalene Billings, RN 12/10/2018 4:27 PM

## 2018-12-11 ENCOUNTER — Ambulatory Visit (INDEPENDENT_AMBULATORY_CARE_PROVIDER_SITE_OTHER): Payer: BLUE CROSS/BLUE SHIELD | Admitting: *Deleted

## 2018-12-11 DIAGNOSIS — I4891 Unspecified atrial fibrillation: Secondary | ICD-10-CM | POA: Diagnosis not present

## 2018-12-11 DIAGNOSIS — Z5181 Encounter for therapeutic drug level monitoring: Secondary | ICD-10-CM | POA: Diagnosis not present

## 2018-12-11 LAB — POCT INR: INR: 3.7 — AB (ref 2.0–3.0)

## 2018-12-11 NOTE — Patient Instructions (Signed)
Hold coumadin tonight then decrease dose to 1/2 tablet daily except 1 tablet on Sundays and Thursdays.   Recheck in 3 weeks

## 2018-12-13 ENCOUNTER — Encounter: Payer: Self-pay | Admitting: *Deleted

## 2018-12-14 ENCOUNTER — Other Ambulatory Visit: Payer: Self-pay

## 2018-12-14 ENCOUNTER — Ambulatory Visit: Payer: BLUE CROSS/BLUE SHIELD | Admitting: Internal Medicine

## 2018-12-14 ENCOUNTER — Ambulatory Visit (INDEPENDENT_AMBULATORY_CARE_PROVIDER_SITE_OTHER): Payer: BLUE CROSS/BLUE SHIELD | Admitting: Nurse Practitioner

## 2018-12-14 ENCOUNTER — Encounter: Payer: Self-pay | Admitting: Nurse Practitioner

## 2018-12-14 DIAGNOSIS — J189 Pneumonia, unspecified organism: Secondary | ICD-10-CM

## 2018-12-14 DIAGNOSIS — J181 Lobar pneumonia, unspecified organism: Secondary | ICD-10-CM | POA: Diagnosis not present

## 2018-12-14 MED ORDER — PREDNISONE 10 MG PO TABS: 20.0000 mg | ORAL_TABLET | Freq: Every day | ORAL | 0 refills | Status: AC

## 2018-12-14 MED ORDER — CETIRIZINE HCL 10 MG PO TABS: 10.0000 mg | ORAL_TABLET | Freq: Every day | ORAL | 0 refills | Status: DC

## 2018-12-14 NOTE — Patient Instructions (Addendum)
Will order prednisone  Will order zyrtec  Try prilosec otc 20mg   Take 30-60 min before first meal of the day and Pepcid ac (famotidine) 20 mg one @  bedtime until cough is completely gone for at least a week without the need for cough suppression     GERD (REFLUX)  is an extremely common cause of respiratory symptoms just like yours , many times with no obvious heartburn at all.    It can be treated with medication, but also with lifestyle changes including elevation of the head of your bed (ideally with 6 -8inch blocks under the headboard of your bed),  Smoking cessation, avoidance of late meals, excessive alcohol, and avoid fatty foods, chocolate, peppermint, colas, red wine, and acidic juices such as orange juice.  NO MINT OR MENTHOL PRODUCTS SO NO COUGH DROPS  USE SUGARLESS CANDY INSTEAD (Jolley ranchers or Stover's or Life Savers) or even ice chips will also do - the key is to swallow to prevent all throat clearing. NO OIL BASED VITAMINS - use powdered substitutes.  Avoid fish oil when coughing.   Take delsym two tsp every 12 hours and supplement if needed with  Tylenol #3  up to 1every 4 hours to suppress the urge to cough. Swallowing water and/or using ice chips/non mint and menthol containing candies (such as lifesavers or sugarless jolly ranchers) are also effective.  You should rest your voice and avoid activities that you know make you cough.  Once you have eliminated the cough for 3 straight days try reducing the Tylenol # 3 first,  then the delsym as tolerated.    Will need a 2 week follow up with Dr. Melvyn Novas with follow up chest x ray

## 2018-12-14 NOTE — Assessment & Plan Note (Signed)
Patient has a tele-visit today for a two-week follow-up.  He was last seen by Dr. Melvyn Novas on 11/28/2018.  He was advised at that visit to stop Lovaza and start Prilosec 20 mg before first meal a day and Pepcid at bedtime.  He was also advised to take Delsym every 12 hours and supplement if needed with Tylenol 3.  Patient states that he has followed these instructions by Dr. Melvyn Novas and has improved.  He still complains today of a cough that is nonproductive and nasal drainage is clear.  He denies any edema.  His weight has been stable -he has actually lost 12 pounds since he was diagnosed with pneumonia.  Will give patient a round of prednisone due to cough -patient was constantly coughing while on the phone.  Will order Zyrtec for sinus drainage.   Patient Instructions  Will order prednisone  Will order zyrtec  Try prilosec otc 20mg   Take 30-60 min before first meal of the day and Pepcid ac (famotidine) 20 mg one @  bedtime until cough is completely gone for at least a week without the need for cough suppression     GERD (REFLUX)  is an extremely common cause of respiratory symptoms just like yours , many times with no obvious heartburn at all.    It can be treated with medication, but also with lifestyle changes including elevation of the head of your bed (ideally with 6 -8inch blocks under the headboard of your bed),  Smoking cessation, avoidance of late meals, excessive alcohol, and avoid fatty foods, chocolate, peppermint, colas, red wine, and acidic juices such as orange juice.  NO MINT OR MENTHOL PRODUCTS SO NO COUGH DROPS  USE SUGARLESS CANDY INSTEAD (Jolley ranchers or Stover's or Life Savers) or even ice chips will also do - the key is to swallow to prevent all throat clearing. NO OIL BASED VITAMINS - use powdered substitutes.  Avoid fish oil when coughing.   Take delsym two tsp every 12 hours and supplement if needed with  Tylenol #3  up to 1every 4 hours to suppress the urge to cough.  Swallowing water and/or using ice chips/non mint and menthol containing candies (such as lifesavers or sugarless jolly ranchers) are also effective.  You should rest your voice and avoid activities that you know make you cough.  Once you have eliminated the cough for 3 straight days try reducing the Tylenol # 3 first,  then the delsym as tolerated.    Will need a 2 week follow up with Dr. Melvyn Novas with follow up chest x ray

## 2018-12-14 NOTE — Progress Notes (Signed)
Virtual Visit via Telephone Note  I connected with [name] on 12/14/18 at 10:45 AM EDT by telephone and verified that I am speaking with the correct person using two identifiers.   I discussed the limitations, risks, security and privacy concerns of performing an evaluation and management service by telephone and the availability of in person appointments. I also discussed with the patient that there may be a patient responsible charge related to this service. The patient expressed understanding and agreed to proceed.   History of Present Illness: 68 year old male with DOE and history of pneumonia who is followed by Dr. Melvyn Novas.  Patient has a tele-visit today for a two-week follow-up.  He was last seen by Dr. Melvyn Novas on 11/28/2018.  He was advised at that visit to stop Lovaza and start Prilosec 20 mg before first meal a day and Pepcid at bedtime.  He was also advised to take Delsym every 12 hours and supplement if needed with Tylenol 3.  Patient states that he has followed these instructions by Dr. Melvyn Novas and has improved.  He still complains today of a cough that is nonproductive and nasal drainage is clear.  He denies any edema.  His weight has been stable -he has actually lost 12 pounds since he was diagnosed with pneumonia.  Denies any fever. Denies f/c/s, n/v/d, hemoptysis, PND, leg swelling.    Observations/Objective:  CXR 11/28/18 - Stable right lower lobe pneumonia.  CAP (community acquired pneumonia) Clinical onset 11/06/2018  R LL sup segment (which has improved quite a bit if one looks at the lateral baseline film 11/08/18 vs the one obtained 11/28/2018  - rx augmentin then levaquin - ESR 11/28/2018 = 35    Assessment and Plan: Patient has a tele-visit today for a two-week follow-up.  He was last seen by Dr. Melvyn Novas on 11/28/2018.  He was advised at that visit to stop Lovaza and start Prilosec 20 mg before first meal a day and Pepcid at bedtime.  He was also advised to take Delsym every 12  hours and supplement if needed with Tylenol 3.  Patient states that he has followed these instructions by Dr. Melvyn Novas and has improved.  He still complains today of a cough that is nonproductive and nasal drainage is clear.  He denies any edema.  His weight has been stable -he has actually lost 12 pounds since he was diagnosed with pneumonia.   Patient Instructions  Will order prednisone  Will order zyrtec  Try prilosec otc 46m  Take 30-60 min before first meal of the day and Pepcid ac (famotidine) 20 mg one @  bedtime until cough is completely gone for at least a week without the need for cough suppression     GERD (REFLUX)  is an extremely common cause of respiratory symptoms just like yours , many times with no obvious heartburn at all.    It can be treated with medication, but also with lifestyle changes including elevation of the head of your bed (ideally with 6 -8inch blocks under the headboard of your bed),  Smoking cessation, avoidance of late meals, excessive alcohol, and avoid fatty foods, chocolate, peppermint, colas, red wine, and acidic juices such as orange juice.  NO MINT OR MENTHOL PRODUCTS SO NO COUGH DROPS  USE SUGARLESS CANDY INSTEAD (Jolley ranchers or Stover's or Life Savers) or even ice chips will also do - the key is to swallow to prevent all throat clearing. NO OIL BASED VITAMINS - use powdered substitutes.  Avoid  fish oil when coughing.   Take delsym two tsp every 12 hours and supplement if needed with  Tylenol #3  up to 1every 4 hours to suppress the urge to cough. Swallowing water and/or using ice chips/non mint and menthol containing candies (such as lifesavers or sugarless jolly ranchers) are also effective.  You should rest your voice and avoid activities that you know make you cough.  Once you have eliminated the cough for 3 straight days try reducing the Tylenol # 3 first,  then the delsym as tolerated.     Follow Up Instructions:  Will need a 2 week follow  up with Dr. Wert with follow up chest x ray     I discussed the assessment and treatment plan with the patient. The patient was provided an opportunity to ask questions and all were answered. The patient agreed with the plan and demonstrated an understanding of the instructions.   The patient was advised to call back or seek an in-person evaluation if the symptoms worsen or if the condition fails to improve as anticipated.  I provided 22 minutes of non-face-to-face time during this encounter.   Tonya S Nichols, NP   

## 2018-12-14 NOTE — Addendum Note (Signed)
Addended by: Nena Polio on: 12/14/2018 04:42 PM   Modules accepted: Orders

## 2018-12-15 NOTE — Progress Notes (Signed)
Chart and office note reviewed in detail  > agree with a/p as outlined    

## 2018-12-17 NOTE — Progress Notes (Addendum)
Received: 2 days ago  Message Contents  Thompson Grayer, MD  Zoe Creasman Panda, RN; Satira Sark, MD        Lets repeat in 2 weeks to see if his volume is getting better.      Patient scheduled to recheck fluid levels on 12/25/2018

## 2018-12-20 ENCOUNTER — Ambulatory Visit: Payer: BLUE CROSS/BLUE SHIELD

## 2018-12-20 ENCOUNTER — Other Ambulatory Visit: Payer: Self-pay

## 2018-12-21 ENCOUNTER — Telehealth: Payer: Self-pay

## 2018-12-21 NOTE — Telephone Encounter (Signed)
Attempted return call to patient as requested by voice mail message regarding he would like a remote transmission done prior to his next Pulmonology visit.  Left message stating next remote transmission is scheduled 12/25/2018 to recheck his fluid levels and if he needs a different date to call back. Left my phone number for return call.

## 2018-12-25 ENCOUNTER — Other Ambulatory Visit: Payer: Self-pay

## 2018-12-25 ENCOUNTER — Ambulatory Visit (INDEPENDENT_AMBULATORY_CARE_PROVIDER_SITE_OTHER): Payer: BLUE CROSS/BLUE SHIELD

## 2018-12-25 DIAGNOSIS — Z95810 Presence of automatic (implantable) cardiac defibrillator: Secondary | ICD-10-CM

## 2018-12-25 DIAGNOSIS — I5022 Chronic systolic (congestive) heart failure: Secondary | ICD-10-CM

## 2018-12-26 NOTE — Progress Notes (Signed)
EPIC Encounter for ICM Monitoring  Patient Name: [name] is a 68 y.o. male Date: 12/26/2018 Primary Care Physican: Rory Percy, MD Primary Cardiologist:McDowell Electrophysiologist:Allred 11/27/2018 Weight:221lbs 12/26/2018 Weight: 217 lbs   Spoke with patient.  Heart failure questions reviewed.  He reports pneumonia has resolved.     Thoracic impedancereturned to normal but was abnormal suggesting fluid accumulation 12/01/2018 - 12/22/2018 (could be r/t pneumonia).   Prescribed:Spironolactone 25 mg take 1 tablet daily.  Recommendations: No changes and encouraged to call if he develops any fluid symptoms.  Follow-up plan: ICM clinic phone appointment on4/27/2020.   Copy of ICM check sent to Dr.Allred and Dr McDowell(pt requested to send update on fluid levels and the return to baseline 4/4).  3 month ICM trend: 12/25/2018    1 Year ICM trend:       Rosalene Billings, RN 12/26/2018 10:35 AM

## 2019-01-01 ENCOUNTER — Ambulatory Visit: Payer: BLUE CROSS/BLUE SHIELD | Admitting: Internal Medicine

## 2019-01-02 ENCOUNTER — Telehealth: Payer: Self-pay | Admitting: *Deleted

## 2019-01-02 NOTE — Telephone Encounter (Signed)
Pt verbalized consent for telehealth appt with Dr Domenic Polite for 01/08/19 via [employer] [place] (explained Webex app and obtained correct email) Reviewed pt medications/allergies/pharmacy. Pt doesn't have a way to check BP/HR at home.

## 2019-01-03 ENCOUNTER — Other Ambulatory Visit: Payer: Self-pay

## 2019-01-03 ENCOUNTER — Ambulatory Visit (INDEPENDENT_AMBULATORY_CARE_PROVIDER_SITE_OTHER): Payer: BLUE CROSS/BLUE SHIELD

## 2019-01-03 ENCOUNTER — Encounter: Payer: Self-pay | Admitting: Internal Medicine

## 2019-01-03 ENCOUNTER — Ambulatory Visit: Payer: BLUE CROSS/BLUE SHIELD | Admitting: Internal Medicine

## 2019-01-03 ENCOUNTER — Ambulatory Visit (INDEPENDENT_AMBULATORY_CARE_PROVIDER_SITE_OTHER): Payer: BLUE CROSS/BLUE SHIELD | Admitting: *Deleted

## 2019-01-03 DIAGNOSIS — R05 Cough: Secondary | ICD-10-CM

## 2019-01-03 DIAGNOSIS — J181 Lobar pneumonia, unspecified organism: Secondary | ICD-10-CM

## 2019-01-03 DIAGNOSIS — J189 Pneumonia, unspecified organism: Secondary | ICD-10-CM | POA: Diagnosis not present

## 2019-01-03 DIAGNOSIS — R058 Other specified cough: Secondary | ICD-10-CM | POA: Insufficient documentation

## 2019-01-03 DIAGNOSIS — I255 Ischemic cardiomyopathy: Secondary | ICD-10-CM

## 2019-01-03 LAB — CUP PACEART REMOTE DEVICE CHECK
Battery Remaining Longevity: 87 mo
Battery Remaining Percentage: 84 %
Battery Voltage: 3.01 V
Brady Statistic RV Percent Paced: 1 %
Date Time Interrogation Session: 20200416060028
HighPow Impedance: 46 Ohm
HighPow Impedance: 46 Ohm
Implantable Lead Implant Date: 20020111
Implantable Lead Location: 753860
Implantable Lead Model: 148
Implantable Lead Serial Number: 116740
Implantable Pulse Generator Implant Date: 20180503
Lead Channel Impedance Value: 660 Ohm
Lead Channel Sensing Intrinsic Amplitude: 12 mV
Lead Channel Setting Pacing Amplitude: 2.5 V
Lead Channel Setting Pacing Pulse Width: 0.5 ms
Lead Channel Setting Sensing Sensitivity: 0.5 mV
Pulse Gen Serial Number: 7421148

## 2019-01-03 NOTE — Patient Instructions (Addendum)
You should not  travel until all restrictions/advisories  are canceled.   For post nasal drainage use zyrtec 10 mg at bedtime or clariton for daytime.  Pulmonary follow up is as needed

## 2019-01-03 NOTE — Progress Notes (Signed)
[name], male    DOB: [birth year], 68 y.o.   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   History of Present Illness  11/28/2018  Pulmonary/ 1st office eval/[name]  Chief Complaint  Patient presents with   Pulmonary Consult    Referred by Dr. Rozann Lesches for eval of persistant PNA. He c/o cough and DOE. His cough is occ prod with clear sputum.   Dyspnea:  Across parking lot = [facility] = can't walk 100 yards even at a slow pace at a flat grade s stopping due to sob   Cough: now with clear mucus / wakes up without cough then worse with activity  Sleep: on side bed flat  SABA use: non inhalers  Diarrhea since taking levaquin but no abd pain rec Stop lovaza (fish oil)  Try prilosec otc 20mg   Take 30-60 min before first meal of the day and Pepcid ac (famotidine) 20 mg one @  bedtime until cough is completely gone for at least a week without the need for cough suppression GERD diet  Take delsym two tsp every 12 hours and supplement if needed with  Tylenol #3  up to 1every 4 hours to suppress the urge to cough.  Please remember to go to the lab and x-ray department   for your tests - we will call you with the results when they are available.    01/03/2019  f/u ov/Gatha Mcnulty re: f/u pna  Chief Complaint  Patient presents with   Follow-up    CXR repeated. Breathing is much improved. No cough or fever.   Dyspnea:  Not limited by breathing from desired activities   / walking neighborhood some hills ok Cough: 98% gone some in am / assoc with pnds 'usual allergies' Sleeping: ok flat / 2 pillows  SABA use: none 02: none    No obvious day to day or daytime variability or assoc excess/ purulent sputum or mucus plugs or hemoptysis or cp or chest tightness, subjective wheeze or overt   symptoms.   Sleeping  without nocturnal  or early am exacerbation  of respiratory  c/o's or need for noct saba. Also denies any obvious fluctuation of symptoms with weather or environmental changes or other aggravating or alleviating factors except as outlined above   No unusual exposure hx or h/o childhood pna/ asthma or knowledge of premature birth.  Current Allergies, Complete Past Medical History, Past Surgical History, Family History, and Social History were reviewed in [employer] record.  ROS  The following are not active complaints unless bolded Hoarseness, sore throat, dysphagia, dental problems, itching, sneezing,  nasal congestion or discharge of excess mucus or purulent secretions, ear ache,   fever, chills, sweats, unintended wt loss or wt gain, classically pleuritic or exertional cp,  orthopnea pnd or arm/hand swelling  or leg swelling, presyncope, palpitations, abdominal pain, anorexia,  nausea, vomiting, diarrhea  or change in bowel habits or change in bladder habits, change in stools or change in urine, dysuria, hematuria,  rash, arthralgias, visual complaints, headache, numbness, weakness or ataxia or problems with walking or coordination,  change in mood or  memory.        Current Meds  Medication Sig   aspirin 81 MG tablet Take 81 mg by mouth daily.     bisoprolol (ZEBETA) 5 MG tablet TAKE 1 TABLET BY MOUTH EVERY DAY (Patient taking differently: Take 5 mg by mouth at bedtime. )   ENTRESTO 24-26 MG TAKE 1 TABLET BY MOUTH TWICE A DAY (Patient taking differently: Take 1 tablet by mouth 2 (two) times daily. )   finasteride (PROSCAR) 5 MG  tablet Take 5 mg by mouth Daily.    nitroGLYCERIN (NITROSTAT) 0.4 MG SL tablet Place 1 tablet (0.4 mg total) under the tongue every 5 (five) minutes as needed for chest pain.   omega-3 acid ethyl esters (LOVAZA) 1 G capsule Take 2 g by mouth 2 (two) times daily.    rosuvastatin (CRESTOR) 20 MG tablet TAKE 1 TABLET (20 MG TOTAL) BY MOUTH DAILY. (Patient taking differently: Take 20 mg by mouth at bedtime. )   spironolactone (ALDACTONE) 25 MG tablet TAKE 1 TABLET BY MOUTH DAILY (Patient taking differently: Take 25 mg by mouth daily. )   warfarin (COUMADIN) 5 MG tablet Take 5 mg by mouth daily. Sun 5 mg - Mon 2 1/2 mg T 5 mg W 2 1/2 mg TH 5 mg F 2 1/2 mg                 Objective:      amb pleasant wm nad   Wt Readings from Last 3 Encounters:  11/28/18 219 lb (99.3 kg)  11/27/18 221 lb (100.2 kg)  11/16/18 221 lb (100.2 kg)     Vital signs reviewed - Note on arrival 02 sats  98% on RA       HEENT: nl dentition, turbinates bilaterally, and oropharynx. Nl external ear canals without cough reflex   NECK :  without JVD/Nodes/TM/ nl carotid upstrokes bilaterally   LUNGS: no acc muscle use,  Nl contour chest which is clear to A and P bilaterally without cough on insp or exp maneuvers   CV:  RRR  no s3 or murmur or increase in P2, and no edema   ABD:  soft and nontender with nl inspiratory excursion in the supine position. No bruits or organomegaly appreciated, bowel sounds nl  MS:  Nl gait/ ext warm without deformities, calf tenderness, cyanosis or clubbing No obvious joint restrictions   SKIN: warm and dry without lesions    NEURO:  alert, approp, nl sensorium with  no motor or cerebellar deficits apparent.     CXR PA and Lateral:   01/03/2019 :    I personally reviewed images and agree with radiology impression as follows:   No active cardiopulmonary disease.

## 2019-01-03 NOTE — Assessment & Plan Note (Addendum)
Onset x decades worse in spring assoc with pnds  - 01/03/2019 rec trial of zyrtec hs or clariton in am    Upper airway cough syndrome (previously labeled PNDS),  is so named because it's frequently impossible to sort out how much is  CR/sinusitis with freq throat clearing (which can be related to primary GERD)   vs  causing  secondary (" extra esophageal")  GERD from wide swings in gastric pressure that occur with throat clearing, often  promoting self use of mint and menthol lozenges that reduce the lower esophageal sphincter tone and exacerbate the problem further in a cyclical fashion.   These are the same pts (now being labeled as having "irritable larynx syndrome" by some cough centers) who not infrequently have a history of having failed to tolerate ace inhibitors (and sometime Entresto quoted at 9% incidence on PI) ,  dry powder inhalers or biphosphonates or report having atypical/extraesophageal reflux symptoms that don't respond to standard doses of PPI  and are easily confused as having aecopd or asthma flares by even experienced allergists/ pulmonologists (myself included).    For now ok to continue entresto, rx with otc's as above and f/u prn    I had an extended summary  discussion with the patient reviewing all relevant studies completed to date and  lasting 15 to 20 minutes of a 25 minute visit    Each maintenance medication was reviewed in detail including most importantly the difference between maintenance and prns and under what circumstances the prns are to be triggered using an action plan format that is not reflected in the computer generated alphabetically organized AVS.     Please see AVS for specific instructions unique to this visit that I personally wrote and verbalized to the the pt in detail and then reviewed with pt  by my nurse highlighting any  changes in therapy recommended at today's visit to their plan of care.

## 2019-01-03 NOTE — Assessment & Plan Note (Addendum)
Clinical onset 11/06/2018  R LL sup segment - rx augmentin then levaquin - ESR 11/28/2018 = 35  - cxr 01/03/2019 resolved > no directed f/u needed    He does not need additional f/u here but I very strongly advised against travel until all COVID-19 restrictions have been lifted as we can't prove this was not covid-19 related and if it was he might be still shedding the virus, if it wasn't he would  be at very high risk of death if he doesn't have the antibody and then gets in on flight/ cruise/ advised

## 2019-01-07 NOTE — Progress Notes (Signed)
Virtual Visit via Video Note   This visit type was conducted due to national recommendations for restrictions regarding the COVID-19 Pandemic (e.g. social distancing) in an effort to limit this patient's exposure and mitigate transmission in our community.  Due to his co-morbid illnesses, this patient is at least at moderate risk for complications without adequate follow up.  This format is felt to be most appropriate for this patient at this time.  All issues noted in this document were discussed and addressed.  A limited physical exam was performed with this format.  Please refer to the patient's chart for his consent to telehealth for [facility].   Evaluation Performed:  Follow-up visit  Date:  01/08/2019   ID:  [name], DOB [birth year], MRN [id number]  Patient Location: Home Provider Location: Office  PCP:  Rory Percy, MD  Cardiologist:  Rozann Lesches, MD Electrophysiologist:  Thompson Grayer, MD   Chief Complaint: Follow-up cardiomyopathy  History of Present Illness:    [name] is a 68 y.o. male last seen in March.  I reviewed interval records, we discussed his treatment of community-acquired pneumonia with follow-up per Dr. Melvyn Novas.  He tells me that he feels much better, back to baseline.  Currently with NYHA class II dyspnea, no cough, fevers or chills.  No chest pain, palpitations, or syncope.  I did complete a form for him to provide to his trip insurance carrier stating that he should not travel at all at this time.  He had planned a cruise to [place] in May.  He is on Coumadin with follow-up in the anticoagulation clinic.  INR was 3.7 on March 24.  He does not report any spontaneous bleeding problems.  He continues to follow with [name] in the device clinic, Regan ICD in place.  Most recent device interrogation showed normal thoracic impedance, but evidence of fluid accumulation from mid March to early April.  Diuretic dosing has not changed.  He  will be seeing Dr. Nadara Mustard for lab work and a physical later this week.  The patient does not have symptoms concerning for COVID-19 infection (fever, chills, cough, or new shortness of breath).  He has been social distancing.   Past Medical History:  Diagnosis Date  . A-V fistula ([place])   . AICD (automatic cardioverter/defibrillator) present 2006   ICD  . Anterior myocardial infarction ([place])   . 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 Chronic systolic heart failure ([place])   . Coronary atherosclerosis of native coronary artery    a. s/p BMS to LAD and OM1 b. patent stents by cath in 2013  . History of hiatal hernia   . Hypertension   . Inducible ventricular tachycardia ([place])   . Ischemic cardiomyopathy    LVEF 30%, status post ICD  . OSA on CPAP   . Paroxysmal atrial fibrillation ([facility])    On coumadin  . Pseudoaneurysm ([place])   . SCC (squamous cell carcinoma) well differentiated 12/13/2011   right temple  . 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 Stroke [facility])    Past Surgical History:  Procedure Laterality Date  . AICD implantation  2011   St Jude ICD implanted for primary prevention of sudden death  . CATARACT EXTRACTION W/PHACO Left 03/27/2017   Procedure: CATARACT EXTRACTION PHACO AND INTRAOCULAR LENS PLACEMENT (IOC);  Surgeon: [name], MD;  Location: [facility];  Service: Ophthalmology;  Laterality: Left;  CDE: 8.48  . CATARACT EXTRACTION W/PHACO Right 05/29/2017   Procedure: CATARACT EXTRACTION PHACO AND INTRAOCULAR LENS PLACEMENT (IOC);  Surgeon: [name], MD;  Location: [facility];  Service: Ophthalmology;  Laterality: Right;  CDE: 7.48  . EYE SURGERY    . ICD GENERATOR CHANGEOUT N/A 01/19/2017   SJM Ellipse VR ICD generator change by Dr [name]  . LAPAROSCOPY N/A 10/23/2017   Procedure: LAPAROSCOPY DIAGNOSTIC;  Surgeon: [name], MD;  Location: [place];  Service: General;  Laterality: N/A;  . LEFT HEART CATH  AND CORONARY ANGIOGRAPHY N/A 06/21/2018   Procedure: LEFT HEART CATH AND CORONARY ANGIOGRAPHY;  Surgeon: [name], MD;  Location: [place] [facility];  Service: Cardiovascular;  Laterality: N/A;  . VENTRAL HERNIA REPAIR N/A 10/23/2017   Procedure: VENTRAL HERNIA REPAIR;  Surgeon: [name], MD;  Location: [place];  Service: General;  Laterality: N/A;     Current Meds  Medication Sig  . aspirin 81 MG tablet Take 81 mg by mouth daily.    . bisoprolol (ZEBETA) 5 MG tablet TAKE 1 TABLET BY MOUTH EVERY DAY (Patient taking differently: Take 5 mg by mouth at bedtime. )  . ENTRESTO 24-26 MG TAKE 1 TABLET BY MOUTH TWICE A DAY (Patient taking differently: Take 1 tablet by mouth 2 (two) times daily. )  . finasteride (PROSCAR) 5 MG tablet Take 5 mg by mouth Daily.   . nitroGLYCERIN (NITROSTAT) 0.4 MG SL tablet Place 1 tablet (0.4 mg total) under the tongue every 5 (five) minutes as needed for chest pain.  [name] omega-3 acid ethyl esters (LOVAZA) 1 G capsule Take 2 g by mouth 2 (two) times daily.   . rosuvastatin (CRESTOR) 20 MG tablet TAKE 1 TABLET (20 MG TOTAL) BY MOUTH DAILY. (Patient taking differently: Take 20 mg by mouth at bedtime. )  . spironolactone (ALDACTONE) 25 MG tablet TAKE 1 TABLET BY MOUTH DAILY (Patient taking differently: Take 25 mg by mouth daily. )  . warfarin (COUMADIN) 5 MG tablet Take 5 mg by mouth daily. Sun 5 mg - Mon 2 1/2 mg T 5 mg W 2 1/2 mg TH 5 mg F 2 1/2 mg     Allergies:   Patient has no known allergies.   Social History   Tobacco Use  . Smoking status: Never Smoker  . Smokeless tobacco: Never Used  Substance Use Topics  . Alcohol use: Yes    Alcohol/week: 0.0 standard drinks    Comment: occasional  . Drug use: No     Family Hx: The patient's family history includes Cancer in his mother; Heart attack in his father; Heart disease in his brother.  ROS:   Please see the history of present illness.    All other systems reviewed and are negative.   Prior  CV studies:   The following studies were reviewed today:  Cardiac catheterization 06/21/2018:  There is severe left ventricular systolic dysfunction. The left ventricular ejection fraction is 25-35% by visual estimate.  LV end diastolic pressure is normal.  Minimal disease with underlying two-vessel disease involving LAD and circumflex:  Ost Cx to Prox Cx stent is 5% stenosed.  Prox LAD stent is 10% stenosed.  Mid Cx lesion is 40% stenosed.  Angiographically minimal disease with widely patent stents in the circumflex and LAD. Severely reduced LVEF of 30% with regional wall motion normality in the anterior and inferior apex with swirling of flow. Normal LVEDP  Echocardiogram 05/22/2018: Study Conclusions  - Left ventricle: The cavity  size was normal. Wall thickness was normal. Systolic function was moderately to severely reduced. The estimated ejection fraction was in the range of 30% to 35%. The study is not technically sufficient to allow evaluation of LV diastolic function. - Regional wall motion abnormality: Akinesis of the mid anteroseptal, apical septal, apical lateral, and apical myocardium. - Aortic valve: Valve area (VTI): 3.57 cm^2. Valve area (Vmax): 3.44 cm^2. Valve area (Vmean): 3.47 cm^2. - Left atrium: The atrium was severely dilated. - Technically adequate study.  Labs/Other Tests and Data Reviewed:    EKG:  An ECG dated 11/16/2018 was personally reviewed today and demonstrated:  Atrial fibrillation with PVC, low voltage and left anterior fascicular block, old anterior infarct pattern, nonspecific T wave changes.  Recent Labs: 06/19/2018: B Natriuretic Peptide 362.7; TSH 0.564 11/16/2018: ALT 48 11/28/2018: BUN 20; Creatinine, Ser 1.21; Hemoglobin 15.4; Platelets 304.0; Potassium 4.4; Pro B Natriuretic peptide (BNP) 209.0; Sodium 134   Wt Readings from Last 3 Encounters:  01/08/19 216 lb (98 kg)  01/03/19 221 lb 3.2 oz (100.3 kg)  11/28/18 219  lb (99.3 kg)     Objective:    Vital Signs:  Ht 6\' 1"  (1.854 m)   Wt 216 lb (98 kg)   BMI 28.50 kg/m    General: Appears comfortable at rest, seated in a chair in his home. HEENT: Conjunctiva and lids normal. Lungs: No audible wheezing or breathlessness while speaking in complete sentences. Skin: Normal color and turgor. Neuropsychiatric: Affect is normal.  Speech pattern normal.  Moves all extremities.  ASSESSMENT & PLAN:    1.  Chronic systolic heart failure with ischemic cardiomyopathy, LVEF 30 to 35% range.  He is doing well at this point, most recent thoracic impedance by device check was normal after a transient increase in fluid as noted above.  Weight is also stable, in fact down a few pounds.  No changes made to current regimen.  2.  St. Jude ICD in place.  No device shocks or syncope.  He continues to follow with [name].  3.  Paroxysmal atrial fibrillation.  No recent palpitations.  He remains on Coumadin with follow-up in the anticoagulation clinic.  4.  Community-acquired pneumonia, management reviewed per chart.  He has been following with Dr. Melvyn Novas and is now back to baseline.  5.  Health maintenance, routine physical with lab work scheduled later this week with Dr. Nadara Mustard.  COVID-19 Education: The signs and symptoms of COVID-19 were discussed with the patient and how to seek care for testing (follow up with PCP or arrange E-visit).  The importance of social distancing was discussed today.  Time:   Today, I have spent 8 minutes with the patient with telehealth technology discussing the above problems.     Medication Adjustments/Labs and Tests Ordered: Current medicines are reviewed at length with the patient today.  Concerns regarding medicines are outlined above.   Tests Ordered: No orders of the defined types were placed in this encounter.   Medication Changes: No orders of the defined types were placed in this encounter.   Disposition:  Follow up 4  months.  Signed, Rozann Lesches, MD  01/08/2019 11:01 AM    [place]

## 2019-01-08 ENCOUNTER — Encounter: Payer: Self-pay | Admitting: Cardiology

## 2019-01-08 ENCOUNTER — Telehealth (INDEPENDENT_AMBULATORY_CARE_PROVIDER_SITE_OTHER): Payer: BLUE CROSS/BLUE SHIELD | Admitting: Cardiology

## 2019-01-08 VITALS — Ht 73.0 in | Wt 216.0 lb

## 2019-01-08 DIAGNOSIS — I255 Ischemic cardiomyopathy: Secondary | ICD-10-CM | POA: Diagnosis not present

## 2019-01-08 DIAGNOSIS — J189 Pneumonia, unspecified organism: Secondary | ICD-10-CM

## 2019-01-08 DIAGNOSIS — J181 Lobar pneumonia, unspecified organism: Secondary | ICD-10-CM

## 2019-01-08 DIAGNOSIS — I480 Paroxysmal atrial fibrillation: Secondary | ICD-10-CM | POA: Diagnosis not present

## 2019-01-08 DIAGNOSIS — Z95810 Presence of automatic (implantable) cardiac defibrillator: Secondary | ICD-10-CM | POA: Diagnosis not present

## 2019-01-08 DIAGNOSIS — I5022 Chronic systolic (congestive) heart failure: Secondary | ICD-10-CM

## 2019-01-08 DIAGNOSIS — Z7189 Other specified counseling: Secondary | ICD-10-CM

## 2019-01-08 NOTE — Patient Instructions (Addendum)
Medication Instructions:   Your physician recommends that you continue on your current medications as directed. Please refer to the Current Medication list given to you today.  Labwork:  NONE  Testing/Procedures:  NONE  Follow-Up:  Your physician recommends that you schedule a follow-up appointment in: 4 months.  Any Other Special Instructions Will Be Listed Below (If Applicable).  If you need a refill on your cardiac medications before your next appointment, please call your pharmacy. 

## 2019-01-09 ENCOUNTER — Encounter: Payer: Self-pay | Admitting: Cardiology

## 2019-01-09 DIAGNOSIS — E7800 Pure hypercholesterolemia, unspecified: Secondary | ICD-10-CM | POA: Diagnosis not present

## 2019-01-09 DIAGNOSIS — I2584 Coronary atherosclerosis due to calcified coronary lesion: Secondary | ICD-10-CM | POA: Diagnosis not present

## 2019-01-09 DIAGNOSIS — R739 Hyperglycemia, unspecified: Secondary | ICD-10-CM | POA: Diagnosis not present

## 2019-01-09 NOTE — Progress Notes (Signed)
Remote ICD transmission.   

## 2019-01-11 DIAGNOSIS — Z6830 Body mass index (BMI) 30.0-30.9, adult: Secondary | ICD-10-CM | POA: Diagnosis not present

## 2019-01-11 DIAGNOSIS — Z23 Encounter for immunization: Secondary | ICD-10-CM | POA: Diagnosis not present

## 2019-01-11 DIAGNOSIS — Z0001 Encounter for general adult medical examination with abnormal findings: Secondary | ICD-10-CM | POA: Diagnosis not present

## 2019-01-14 ENCOUNTER — Other Ambulatory Visit: Payer: Self-pay

## 2019-01-14 ENCOUNTER — Ambulatory Visit (INDEPENDENT_AMBULATORY_CARE_PROVIDER_SITE_OTHER): Payer: BLUE CROSS/BLUE SHIELD

## 2019-01-14 DIAGNOSIS — Z95810 Presence of automatic (implantable) cardiac defibrillator: Secondary | ICD-10-CM | POA: Diagnosis not present

## 2019-01-14 DIAGNOSIS — I5022 Chronic systolic (congestive) heart failure: Secondary | ICD-10-CM | POA: Diagnosis not present

## 2019-01-15 NOTE — Progress Notes (Signed)
EPIC Encounter for ICM Monitoring  Patient Name: [name] is a 68 y.o. male Date: 01/15/2019 Primary Care Physican: Rory Percy, MD Primary Cardiologist:McDowell Electrophysiologist:Allred 3/10/2020Weight:221lbs 12/26/2018 Weight: 217 lbs 01/15/2019 Weight: 218 lbs   Heart failure questions reviewed and he is asymptomatic.    Thoracic impedance at baseline normal 01/14/2019 but was abnormal suggesting fluid accumulation 01/07/2019 -01/14/2019.   Prescribed:Spironolactone 25 mg take 1 tablet daily.  Recommendations:Advised to monitor diet and limit salt intake.  No changes and encouraged to call if he develops any fluid symptoms.  Follow-up plan: ICM clinic phone appointment on5/15/2020 to recheck fluid levels.   Copy of ICM check sent to Dr.Allred.  3 month ICM trend: 01/14/2019    1 Year ICM trend:       Rosalene Billings, RN 01/15/2019 12:30 PM

## 2019-02-01 ENCOUNTER — Other Ambulatory Visit: Payer: Self-pay

## 2019-02-01 ENCOUNTER — Ambulatory Visit (INDEPENDENT_AMBULATORY_CARE_PROVIDER_SITE_OTHER): Payer: BLUE CROSS/BLUE SHIELD

## 2019-02-01 DIAGNOSIS — I5022 Chronic systolic (congestive) heart failure: Secondary | ICD-10-CM

## 2019-02-01 DIAGNOSIS — Z95810 Presence of automatic (implantable) cardiac defibrillator: Secondary | ICD-10-CM

## 2019-02-01 NOTE — Progress Notes (Signed)
EPIC Encounter for ICM Monitoring  Patient Name: [name] is a 68 y.o. male Date: 02/01/2019 Primary Care Physican: Rory Percy, MD Primary Cardiologist:McDowell Electrophysiologist:Allred 12/26/2018 Weight:217lbs 01/15/2019 Weight: 218 lbs   Heart failure questions reviewed and he is asymptomatic. Explained limiting fluid intake but should increase fluids if he is in a situation that he is hot and sweating so that he stays hydrated.  [place]vue Thoracic impedance close baselinenormal but was abnormal suggesting fluid accumulation 01/25/2019-01/30/2019.   Prescribed:Spironolactone 25 mg take 1 tablet daily.  Recommendations:Advised to monitor diet and limit salt intake.  No changes and encouraged to call if he develops any fluid symptoms.  Follow-up plan: ICM clinic phone appointment on6/04/2019.  Copy of ICM check sent to Dr.Allred.  3 month ICM trend: 02/01/2019    1 Year ICM trend:       Rosalene Billings, RN 02/01/2019 4:53 PM

## 2019-02-11 ENCOUNTER — Other Ambulatory Visit: Payer: Self-pay | Admitting: Cardiology

## 2019-02-12 ENCOUNTER — Other Ambulatory Visit: Payer: Self-pay | Admitting: *Deleted

## 2019-02-12 MED ORDER — WARFARIN SODIUM 5 MG PO TABS: ORAL_TABLET | ORAL | 3 refills | Status: DC

## 2019-02-12 NOTE — Telephone Encounter (Signed)
Pt past due for INR check.  Called pt and appt made for 02/18/19.

## 2019-02-18 ENCOUNTER — Other Ambulatory Visit: Payer: Self-pay

## 2019-02-18 ENCOUNTER — Ambulatory Visit (INDEPENDENT_AMBULATORY_CARE_PROVIDER_SITE_OTHER): Payer: BLUE CROSS/BLUE SHIELD | Admitting: *Deleted

## 2019-02-18 DIAGNOSIS — I480 Paroxysmal atrial fibrillation: Secondary | ICD-10-CM

## 2019-02-18 DIAGNOSIS — Z5181 Encounter for therapeutic drug level monitoring: Secondary | ICD-10-CM

## 2019-02-18 LAB — POCT INR: INR: 2.1 (ref 2.0–3.0)

## 2019-02-18 NOTE — Patient Instructions (Signed)
Continue coumadin 1/2 tablet daily except 1 tablet on Sundays and Thursdays.   Recheck in 4 weeks

## 2019-02-25 ENCOUNTER — Ambulatory Visit (INDEPENDENT_AMBULATORY_CARE_PROVIDER_SITE_OTHER): Payer: BLUE CROSS/BLUE SHIELD

## 2019-02-25 DIAGNOSIS — Z95810 Presence of automatic (implantable) cardiac defibrillator: Secondary | ICD-10-CM

## 2019-02-25 DIAGNOSIS — I5022 Chronic systolic (congestive) heart failure: Secondary | ICD-10-CM

## 2019-03-01 NOTE — Progress Notes (Signed)
EPIC Encounter for ICM Monitoring  Patient Name: [name] is a 68 y.o. male Date: 03/01/2019 Primary Care Physican: Rory Percy, MD Primary Cardiologist:McDowell Electrophysiologist:Allred 01/15/2019 Weight:218lbs 03/01/2019 Weight:217lbs   Heart failure questions reviewedand heis asymptomatic.  Corvue Thoracic impedancenormal  Prescribed:Spironolactone 25 mg take 1 tablet daily.  Recommendations: Advised to monitor diet and limit salt intake.No changes and encouraged to call if he develops any fluid symptoms.  Follow-up plan: ICM clinic phone appointment on7/15/2020.  Copy of ICM check sent to Dr.Allred.  3 month ICM trend: 02/25/2019    1 Year ICM trend:       Rosalene Billings, RN 03/01/2019 9:19 AM

## 2019-03-21 ENCOUNTER — Ambulatory Visit (INDEPENDENT_AMBULATORY_CARE_PROVIDER_SITE_OTHER): Payer: BC Managed Care – PPO | Admitting: *Deleted

## 2019-03-21 ENCOUNTER — Other Ambulatory Visit: Payer: Self-pay

## 2019-03-21 DIAGNOSIS — I480 Paroxysmal atrial fibrillation: Secondary | ICD-10-CM | POA: Diagnosis not present

## 2019-03-21 DIAGNOSIS — I4821 Permanent atrial fibrillation: Secondary | ICD-10-CM | POA: Diagnosis not present

## 2019-03-21 DIAGNOSIS — Z5181 Encounter for therapeutic drug level monitoring: Secondary | ICD-10-CM

## 2019-03-21 LAB — POCT INR: INR: 2.4 (ref 2.0–3.0)

## 2019-03-21 NOTE — Patient Instructions (Signed)
Continue coumadin 1/2 tablet daily except 1 tablet on Sundays and Thursdays.   Recheck in 6 weeks

## 2019-04-03 ENCOUNTER — Encounter: Payer: Self-pay | Admitting: *Deleted

## 2019-04-03 ENCOUNTER — Ambulatory Visit (INDEPENDENT_AMBULATORY_CARE_PROVIDER_SITE_OTHER): Payer: BC Managed Care – PPO

## 2019-04-03 DIAGNOSIS — Z95810 Presence of automatic (implantable) cardiac defibrillator: Secondary | ICD-10-CM | POA: Diagnosis not present

## 2019-04-03 DIAGNOSIS — I5022 Chronic systolic (congestive) heart failure: Secondary | ICD-10-CM

## 2019-04-04 ENCOUNTER — Ambulatory Visit (INDEPENDENT_AMBULATORY_CARE_PROVIDER_SITE_OTHER): Payer: BC Managed Care – PPO | Admitting: *Deleted

## 2019-04-04 DIAGNOSIS — I255 Ischemic cardiomyopathy: Secondary | ICD-10-CM

## 2019-04-05 ENCOUNTER — Telehealth: Payer: Self-pay

## 2019-04-05 LAB — CUP PACEART REMOTE DEVICE CHECK
Date Time Interrogation Session: 20200717053852
Implantable Lead Implant Date: 20020111
Implantable Lead Location: 753860
Implantable Lead Model: 148
Implantable Lead Serial Number: 116740
Implantable Pulse Generator Implant Date: 20180503
Pulse Gen Serial Number: 7421148

## 2019-04-05 NOTE — Telephone Encounter (Signed)
Remote ICM transmission received.  Attempted call to patient regarding ICM remote transmission and left detailed message, per DPR, with next ICM remote transmission date of 05/06/2019.  Advised to return call for any fluid symptoms or questions.    

## 2019-04-05 NOTE — Progress Notes (Signed)
EPIC Encounter for ICM Monitoring  Patient Name: [name] is a 68 y.o. male Date: 04/05/2019 Primary Care Physican: Rory Percy, MD Primary Cardiologist:McDowell Electrophysiologist:Allred 03/01/2019 Weight:217lbs   Attempted call to patient and unable to reach.  Left detailed message per DPR regarding transmission. Transmission reviewed.   CorvueThoracic impedanceslightly below baseline normal on transmission day.  Prescribed:Spironolactone 25 mg take 1 tablet daily.  Recommendations: Left voice mail with ICM number and encouraged to call if experiencing any fluid symptoms.  Follow-up plan: ICM clinic phone appointment on8/17/2020. Office appt with Dr Domenic Polite on 05/13/2019.  Copy of ICM check sent to Dr.Allred.   3 month ICM trend: 04/03/2019    1 Year ICM trend:       Rosalene Billings, RN 04/05/2019 10:24 AM

## 2019-04-09 ENCOUNTER — Other Ambulatory Visit: Payer: Self-pay | Admitting: Cardiology

## 2019-04-11 ENCOUNTER — Encounter: Payer: Self-pay | Admitting: Cardiology

## 2019-04-11 NOTE — Progress Notes (Signed)
Remote ICD transmission.   

## 2019-04-25 ENCOUNTER — Other Ambulatory Visit: Payer: Self-pay

## 2019-04-25 ENCOUNTER — Ambulatory Visit (INDEPENDENT_AMBULATORY_CARE_PROVIDER_SITE_OTHER): Payer: BC Managed Care – PPO | Admitting: *Deleted

## 2019-04-25 DIAGNOSIS — I4821 Permanent atrial fibrillation: Secondary | ICD-10-CM | POA: Diagnosis not present

## 2019-04-25 DIAGNOSIS — I480 Paroxysmal atrial fibrillation: Secondary | ICD-10-CM | POA: Diagnosis not present

## 2019-04-25 DIAGNOSIS — Z5181 Encounter for therapeutic drug level monitoring: Secondary | ICD-10-CM

## 2019-04-25 LAB — POCT INR: INR: 2.2 (ref 2.0–3.0)

## 2019-04-25 NOTE — Patient Instructions (Signed)
Continue coumadin 1/2 tablet daily except 1 tablet on Sundays and Thursdays.   Recheck in 6 weeks

## 2019-05-06 ENCOUNTER — Ambulatory Visit (INDEPENDENT_AMBULATORY_CARE_PROVIDER_SITE_OTHER): Payer: BC Managed Care – PPO

## 2019-05-06 DIAGNOSIS — Z95810 Presence of automatic (implantable) cardiac defibrillator: Secondary | ICD-10-CM

## 2019-05-06 DIAGNOSIS — I5022 Chronic systolic (congestive) heart failure: Secondary | ICD-10-CM | POA: Diagnosis not present

## 2019-05-08 NOTE — Progress Notes (Signed)
EPIC Encounter for ICM Monitoring  Patient Name: [name] is a 68 y.o. male Date: 05/08/2019 Primary Care Physican: Rory Percy, MD Primary Cardiologist:McDowell Electrophysiologist:Allred 03/01/2019 Weight:217lbs 05/08/2019 Weight: 217 -218  lbs   Spoke with patient and said he is feeling fine.   CorvueThoracic impedance normal.  Prescribed:Spironolactone 25 mg take 1 tablet daily.  Recommendations:No changes and encouraged to call if experiencing any fluid symptoms.  Follow-up plan: ICM clinic phone appointment on10/01/2019. Office appt with Dr Domenic Polite on 05/13/2019 and Dr [name] 05/24/2019.  Copy of ICM check sent to Dr.Allred.   3 month ICM trend: 05/06/2019    1 Year ICM trend:       Rosalene Billings, RN 05/08/2019 8:13 AM

## 2019-05-12 NOTE — Progress Notes (Signed)
Cardiology Office Note  Date: 05/13/2019   ID: DIMETRI DRUCKMAN, DOB [birth year], MRN [id number]  PCP:  Rory Percy, MD  Cardiologist:  Rozann Lesches, MD Electrophysiologist:  Thompson Grayer, MD   Chief Complaint  Patient presents with  . Cardiac follow-up    History of Present Illness: [name] is a 68 y.o. male last assessed via telehealth encounter in April.  He presents for a routine follow-up visit.  He states that he feels very well overall.  He does not report any palpitations or chest pain, NYHA class II dyspnea.  He remains on Coumadin with follow-up in the anticoagulation clinic. Last INR was 2.2.  He does not report any bleeding episodes.  He continues to follow with [name] in the device clinic, [place] ICD in place. Recent thoracic impedance was normal.  He has had no device shocks or syncope.  Reviewed his medications which are stable and outlined below.  He reports compliance and no intolerances.  Only other symptom he mentions at this time is recurring loose stools in the morning after he eats breakfast, does not occur with other meals during the daytime.  He does not drink milk with breakfast.  He is going to try some probiotics.  Past Medical History:  Diagnosis Date  . A-V fistula ([place])   . AICD (automatic cardioverter/defibrillator) present 2006   ICD  . Anterior myocardial infarction ([place])   . 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 Chronic systolic heart failure ([place])   . Coronary atherosclerosis of native coronary artery    a. s/p BMS to LAD and OM1 b. patent stents by cath in 2013  . History of hiatal hernia   . Hypertension   . Inducible ventricular tachycardia ([place])   . Ischemic cardiomyopathy    LVEF 30%, status post ICD  . OSA on CPAP   . Paroxysmal atrial fibrillation (HCC)    On coumadin  . Pseudoaneurysm ([place])   . SCC (squamous cell carcinoma) 08/04/2006   right shoulder (CX35FU)  . SCC  (squamous cell carcinoma) well differentiated 12/13/2011   right temple  . 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 Stroke [facility])     Past Surgical History:  Procedure Laterality Date  . AICD implantation  2011   St Jude ICD implanted for primary prevention of sudden death  . CATARACT EXTRACTION W/PHACO Left 03/27/2017   Procedure: CATARACT EXTRACTION PHACO AND INTRAOCULAR LENS PLACEMENT (IOC);  Surgeon: [name], MD;  Location: [facility];  Service: Ophthalmology;  Laterality: Left;  CDE: 8.48  . CATARACT EXTRACTION W/PHACO Right 05/29/2017   Procedure: CATARACT EXTRACTION PHACO AND INTRAOCULAR LENS PLACEMENT (IOC);  Surgeon: [name], MD;  Location: [facility];  Service: Ophthalmology;  Laterality: Right;  CDE: 7.48  . EYE SURGERY    . ICD GENERATOR CHANGEOUT N/A 01/19/2017   SJM [place] VR ICD generator change by Dr [name]  . LAPAROSCOPY N/A 10/23/2017   Procedure: LAPAROSCOPY DIAGNOSTIC;  Surgeon: [name], MD;  Location: [place];  Service: General;  Laterality: N/A;  . LEFT HEART CATH AND CORONARY ANGIOGRAPHY N/A 06/21/2018   Procedure: LEFT HEART CATH AND CORONARY ANGIOGRAPHY;  Surgeon: [name], MD;  Location: [place] CV LAB;  Service: Cardiovascular;  Laterality: N/A;  . VENTRAL HERNIA REPAIR N/A 10/23/2017   Procedure: VENTRAL HERNIA REPAIR;  Surgeon: [name], MD;  Location: [place];  Service: General;  Laterality: N/A;    Current Outpatient Medications  Medication Sig Dispense Refill  . aspirin 81 MG tablet Take 81 mg by mouth daily.      . bisoprolol (ZEBETA) 5 MG tablet TAKE 1 TABLET BY MOUTH EVERY DAY (Patient taking differently: Take 5 mg by mouth at bedtime. ) 90 tablet 2  . ENTRESTO 24-26 MG TAKE 1 TABLET BY MOUTH TWICE A DAY (Patient taking differently: Take 1 tablet by mouth 2 (two) times daily. ) 180 tablet 3  . finasteride (PROSCAR) 5 MG tablet Take 5 mg by mouth Daily.     . nitroGLYCERIN (NITROSTAT) 0.4 MG SL tablet  Place 1 tablet (0.4 mg total) under the tongue every 5 (five) minutes as needed for chest pain. 25 tablet 0  . omega-3 acid ethyl esters (LOVAZA) 1 G capsule Take 2 g by mouth 2 (two) times daily.     . rosuvastatin (CRESTOR) 20 MG tablet TAKE 1 TABLET (20 MG TOTAL) BY MOUTH DAILY. 90 tablet 2  . spironolactone (ALDACTONE) 25 MG tablet TAKE 1 TABLET BY MOUTH DAILY 90 tablet 3  . warfarin (COUMADIN) 5 MG tablet Take 1/2 tablet daily except 1 tablet on Sundays and Thursdays 90 tablet 3   No current facility-administered medications for this visit.    Allergies:  Patient has no known allergies.   Social History: The patient  reports that he has never smoked. He has never used smokeless tobacco. He reports current alcohol use. He reports that he does not use drugs.   ROS:  Please see the history of present illness. Otherwise, complete review of systems is positive for none.  All other systems are reviewed and negative.   Physical Exam: VS:  BP 90/62   Pulse 87   Ht 6\' 1"  (1.854 m)   Wt 227 lb (103 kg)   SpO2 98%   BMI 29.95 kg/m , BMI Body mass index is 29.95 kg/m.  Wt Readings from Last 3 Encounters:  05/13/19 227 lb (103 kg)  01/08/19 216 lb (98 kg)  01/03/19 221 lb 3.2 oz (100.3 kg)    General: Patient appears comfortable at rest. HEENT: Conjunctiva and lids normal, wearing a mask. Neck: Supple, no elevated JVP or carotid bruits, no thyromegaly. Lungs: Clear to auscultation, nonlabored breathing at rest. Cardiac: Irregularly irregular, no S3 or significant systolic murmur. Abdomen: Soft, nontender, bowel sounds present. Extremities: No pitting edema, distal pulses 2+. Skin: Warm and dry. Musculoskeletal: No kyphosis. Neuropsychiatric: Alert and oriented x3, affect grossly appropriate.  ECG:  An ECG dated 11/16/2018 was personally reviewed today and demonstrated:  Atrial fibrillation with PVC, low voltage and left anterior fascicular block, old anterior infarct pattern,  nonspecific T wave changes.  Recent Labwork: 06/19/2018: B Natriuretic Peptide 362.7; TSH 0.564 11/16/2018: ALT 48; AST 33 11/28/2018: BUN 20; Creatinine, Ser 1.21; Hemoglobin 15.4; Platelets 304.0; Potassium 4.4; Pro B Natriuretic peptide (BNP) 209.0; Sodium 134   Other Studies Reviewed Today:  Cardiac catheterization 06/21/2018:  There is severe left ventricular systolic dysfunction. The left ventricular ejection fraction is 25-35% by visual estimate.  LV end diastolic pressure is normal.  Minimal disease with underlying two-vessel disease involving LAD and circumflex:  Ost Cx to Prox Cx stent is 5% stenosed.  Prox LAD stent is 10% stenosed.  Mid Cx lesion is 40% stenosed.  Angiographically minimal disease with widely patent stents in the circumflex and LAD. Severely reduced LVEF of 30% with regional wall motion normality in the anterior and inferior apex with  swirling of flow. Normal LVEDP  Echocardiogram 05/22/2018: Study Conclusions  - Left ventricle: The cavity size was normal. Wall thickness was normal. Systolic function was moderately to severely reduced. The estimated ejection fraction was in the range of 30% to 35%. The study is not technically sufficient to allow evaluation of LV diastolic function. - Regional wall motion abnormality: Akinesis of the mid anteroseptal, apical septal, apical lateral, and apical myocardium. - Aortic valve: Valve area (VTI): 3.57 cm^2. Valve area (Vmax): 3.44 cm^2. Valve area (Vmean): 3.47 cm^2. - Left atrium: The atrium was severely dilated. - Technically adequate study.  Assessment and Plan:  1.  Chronic systolic heart failure.  He is doing well with stable weight and recent normal thoracic impedance.  2.  Ischemic cardiomyopathy with LVEF 30 to 35%.  We will obtain a follow-up echocardiogram in comparison to study from September of last year.  No change in current regimen which includes aspirin, Crestor, Zebeta,  Entresto, and Aldactone.  3.  St. Jude ICD in place.  Keep follow-up with [name].  No device shocks or syncope.  4.  Persistent atrial fibrillation.  He does not report any active palpitations at this time.  We will continue Zebeta and Coumadin.  Medication Adjustments/Labs and Tests Ordered: Current medicines are reviewed at length with the patient today.  Concerns regarding medicines are outlined above.   Tests Ordered: Orders Placed This Encounter  Procedures  . ECHOCARDIOGRAM COMPLETE    Medication Changes: No orders of the defined types were placed in this encounter.   Disposition:  Follow up 6 months in the [place] office.  Signed, Satira Sark, MD, [facility] 05/13/2019 10:02 AM    [facility] [address], [place], [place] [address] Phone: [phone]; Fax: [phone]

## 2019-05-13 ENCOUNTER — Telehealth: Payer: Self-pay | Admitting: Cardiology

## 2019-05-13 ENCOUNTER — Other Ambulatory Visit: Payer: Self-pay

## 2019-05-13 ENCOUNTER — Ambulatory Visit (INDEPENDENT_AMBULATORY_CARE_PROVIDER_SITE_OTHER): Payer: BC Managed Care – PPO | Admitting: Cardiology

## 2019-05-13 ENCOUNTER — Encounter: Payer: Self-pay | Admitting: Cardiology

## 2019-05-13 VITALS — BP 90/62 | HR 87 | Ht 73.0 in | Wt 227.0 lb

## 2019-05-13 DIAGNOSIS — I255 Ischemic cardiomyopathy: Secondary | ICD-10-CM | POA: Diagnosis not present

## 2019-05-13 DIAGNOSIS — I5022 Chronic systolic (congestive) heart failure: Secondary | ICD-10-CM

## 2019-05-13 DIAGNOSIS — Z95810 Presence of automatic (implantable) cardiac defibrillator: Secondary | ICD-10-CM

## 2019-05-13 DIAGNOSIS — I480 Paroxysmal atrial fibrillation: Secondary | ICD-10-CM

## 2019-05-13 NOTE — Telephone Encounter (Signed)
°  Precert needed for: Echo   Location: [facility]   Date: Sept 23, 2020

## 2019-05-13 NOTE — Patient Instructions (Addendum)

## 2019-05-23 ENCOUNTER — Telehealth: Payer: Self-pay

## 2019-05-23 NOTE — Telephone Encounter (Signed)
Spoke with pt regarding appt on 05/29/19. Pt stated he did not have any questions at this time. Pt concerns were address.

## 2019-05-24 ENCOUNTER — Telehealth: Payer: BC Managed Care – PPO | Admitting: Internal Medicine

## 2019-05-29 ENCOUNTER — Encounter: Payer: Self-pay | Admitting: Internal Medicine

## 2019-05-29 ENCOUNTER — Telehealth (INDEPENDENT_AMBULATORY_CARE_PROVIDER_SITE_OTHER): Payer: BC Managed Care – PPO | Admitting: Internal Medicine

## 2019-05-29 VITALS — Ht 73.0 in | Wt 219.0 lb

## 2019-05-29 DIAGNOSIS — I5022 Chronic systolic (congestive) heart failure: Secondary | ICD-10-CM | POA: Diagnosis not present

## 2019-05-29 DIAGNOSIS — I255 Ischemic cardiomyopathy: Secondary | ICD-10-CM

## 2019-05-29 DIAGNOSIS — I4819 Other persistent atrial fibrillation: Secondary | ICD-10-CM | POA: Diagnosis not present

## 2019-05-29 NOTE — Progress Notes (Signed)
Electrophysiology TeleHealth Note   Due to national recommendations of social distancing due to COVID 19, an audio/video telehealth visit is felt to be most appropriate for this patient at this time.  See MyChart message from today for the patient's consent to telehealth for [facility].   Date:  05/29/2019   ID:  [name], DOB September 10, 1951, MRN [id number]  Location: patient's home  Provider location:  [facility]  Evaluation Performed: Follow-up visit  PCP:  Rory Percy, MD   Electrophysiologist:  Dr [name]  Chief Complaint:  CHF  History of Present Illness:    [name] is a 68 y.o. male who presents via telehealth conferencing today.  Since last being seen in our clinic, the patient reports doing very well.  Today, he denies symptoms of palpitations, chest pain, shortness of breath,  lower extremity edema, dizziness, presyncope, or syncope.  The patient is otherwise without complaint today.  The patient denies symptoms of fevers, chills, cough, or new SOB worrisome for COVID 19.  Past Medical History:  Diagnosis Date  . A-V fistula ([place])   . AICD (automatic cardioverter/defibrillator) present 2006   ICD  . Anterior myocardial infarction ([place])   . 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 Chronic systolic heart failure ([place])   . Coronary atherosclerosis of native coronary artery    a. s/p BMS to LAD and OM1 b. patent stents by cath in 2013  . History of hiatal hernia   . Hypertension   . 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 Ischemic cardiomyopathy    LVEF 30%, status post ICD  . OSA on CPAP   . Paroxysmal atrial fibrillation (HCC)    On coumadin  . Pseudoaneurysm ([place])   . SCC (squamous cell carcinoma) 08/04/2006   right shoulder (CX35FU)  . SCC (squamous cell carcinoma) well differentiated 12/13/2011   right temple  . 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 Stroke [facility])     Past Surgical History:  Procedure Laterality Date  . AICD implantation  2011   St Jude ICD implanted for primary prevention of sudden death  . CATARACT EXTRACTION W/PHACO Left 03/27/2017   Procedure: CATARACT EXTRACTION PHACO AND INTRAOCULAR LENS PLACEMENT (IOC);  Surgeon: [name], MD;  Location: [facility];  Service: Ophthalmology;  Laterality: Left;  CDE: 8.48  . CATARACT EXTRACTION W/PHACO Right 05/29/2017   Procedure: CATARACT EXTRACTION PHACO AND INTRAOCULAR LENS PLACEMENT (IOC);  Surgeon: [name], MD;  Location: [facility];  Service: Ophthalmology;  Laterality: Right;  CDE: 7.48  . EYE SURGERY    . ICD GENERATOR CHANGEOUT N/A 01/19/2017   SJM Ellip[place] VR ICD generator change by Dr [name]  . LAPAROSCOPY N/A 10/23/2017   Procedure: LAPAROSCOPY DIAGNOSTIC;  Surgeon: [name], MD;  Location: [place];  Service: General;  Laterality: N/A;  . LEFT HEART CATH AND CORONARY ANGIOGRAPHY N/A 06/21/2018   Procedure: LEFT HEART CATH AND CORONARY ANGIOGRAPHY;  Surgeon: [name], MD;  Location: [place] CV LAB;  Service: Cardiovascular;  Laterality: N/A;  . VENTRAL HERNIA REPAIR N/A 10/23/2017   Procedure: VENTRAL HERNIA REPAIR;  Surgeon: [name], MD;  Location: [place];  Service: General;  Laterality: N/A;    Current Outpatient Medications  Medication Sig Dispense Refill  . aspirin 81 MG tablet Take 81 mg by mouth daily.      . bisoprolol (ZEBETA) 5 MG tablet TAKE 1 TABLET  BY MOUTH EVERY DAY (Patient taking differently: Take 5 mg by mouth at bedtime. ) 90 tablet 2  . ENTRESTO 24-26 MG TAKE 1 TABLET BY MOUTH TWICE A DAY (Patient taking differently: Take 1 tablet by mouth 2 (two) times daily. ) 180 tablet 3  . finasteride (PROSCAR) 5 MG tablet Take 5 mg by mouth Daily.     . nitroGLYCERIN (NITROSTAT) 0.4 MG SL tablet Place 1 tablet (0.4 mg total) under the tongue every 5 (five) minutes as needed for chest pain. 25 tablet 0  . rosuvastatin (CRESTOR) 20 MG tablet TAKE 1 TABLET (20 MG  TOTAL) BY MOUTH DAILY. 90 tablet 2  . spironolactone (ALDACTONE) 25 MG tablet TAKE 1 TABLET BY MOUTH DAILY 90 tablet 3  . warfarin (COUMADIN) 5 MG tablet Take 1/2 tablet daily except 1 tablet on Sundays and Thursdays 90 tablet 3   No current facility-administered medications for this visit.     Allergies:   Patient has no known allergies.   Social History:  The patient  reports that he has never smoked. He has never used smokeless tobacco. He reports current alcohol use. He reports that he does not use drugs.   Family History:  The patient's family history includes Cancer in his mother; Heart attack in his father; Heart disease in his brother.   ROS:  Please see the history of present illness.   All other systems are personally reviewed and negative.    Exam:    Vital Signs:  Ht 6\' 1"  (1.854 m)   Wt 219 lb (99.3 kg)   BMI 28.89 kg/m   Well sounding and appearing, alert and conversant, regular work of breathing,  good skin color Eyes- anicteric, neuro- grossly intact, skin- no apparent rash or lesions or cyanosis, mouth- oral mucosa is pink  Labs/Other Tests and Data Reviewed:    Recent Labs: 06/19/2018: B Natriuretic Peptide 362.7; TSH 0.564 11/16/2018: ALT 48 11/28/2018: BUN 20; Creatinine, Ser 1.21; Hemoglobin 15.4; Platelets 304.0; Potassium 4.4; Pro B Natriuretic peptide (BNP) 209.0; Sodium 134   Wt Readings from Last 3 Encounters:  05/29/19 219 lb (99.3 kg)  05/13/19 227 lb (103 kg)  01/08/19 216 lb (98 kg)     Last device remote is reviewed from [place] PDF which reveals normal device function, no arrhythmias   ASSESSMENT & PLAN:    1.  Chronic systolic dysfunction/ ischemic CM Doing well with medical therapy No CHF symptoms No ischemic symptoms Followed in ICM clinic  2. Persistent afib Rate controlled Doing well On coumadin  Follow-up: remotes Return to see me in a year   Patient Risk:  after full review of this patients clinical status, I feel that  they are at moderate risk at this time.  Today, I have spent 15 minutes with the patient with telehealth technology discussing arrhythmia management .    Army Fossa, MD  05/29/2019 9:38 AM     [facility] [address] [place]  [place] [address] [phone] (office) [phone] (fax)

## 2019-06-06 ENCOUNTER — Ambulatory Visit (INDEPENDENT_AMBULATORY_CARE_PROVIDER_SITE_OTHER): Payer: BC Managed Care – PPO | Admitting: *Deleted

## 2019-06-06 ENCOUNTER — Other Ambulatory Visit: Payer: Self-pay

## 2019-06-06 DIAGNOSIS — I480 Paroxysmal atrial fibrillation: Secondary | ICD-10-CM

## 2019-06-06 DIAGNOSIS — I4821 Permanent atrial fibrillation: Secondary | ICD-10-CM

## 2019-06-06 DIAGNOSIS — Z5181 Encounter for therapeutic drug level monitoring: Secondary | ICD-10-CM | POA: Diagnosis not present

## 2019-06-06 LAB — POCT INR: INR: 2.2 (ref 2.0–3.0)

## 2019-06-06 NOTE — Patient Instructions (Signed)
Continue coumadin 1/2 tablet daily except 1 tablet on Sundays and Thursdays.   Recheck in 6 weeks 

## 2019-06-12 ENCOUNTER — Ambulatory Visit (INDEPENDENT_AMBULATORY_CARE_PROVIDER_SITE_OTHER): Payer: BC Managed Care – PPO

## 2019-06-12 ENCOUNTER — Other Ambulatory Visit: Payer: Self-pay

## 2019-06-12 DIAGNOSIS — I255 Ischemic cardiomyopathy: Secondary | ICD-10-CM | POA: Diagnosis not present

## 2019-06-12 DIAGNOSIS — Z23 Encounter for immunization: Secondary | ICD-10-CM | POA: Diagnosis not present

## 2019-06-13 ENCOUNTER — Telehealth: Payer: Self-pay | Admitting: *Deleted

## 2019-06-13 NOTE — Telephone Encounter (Signed)
-----   Message from Satira Sark, MD sent at 06/13/2019  8:21 AM EDT ----- Results reviewed.  Study reviewed.  Overall no substantial change in LVEF, approximately 30% and consistent with his history of ischemic cardiomyopathy.  In light of clinical stability on current medical therapy, no changes for now.

## 2019-06-13 NOTE — Telephone Encounter (Signed)
Patient informed. Copy sent to PCP °

## 2019-06-24 ENCOUNTER — Ambulatory Visit (INDEPENDENT_AMBULATORY_CARE_PROVIDER_SITE_OTHER): Payer: BC Managed Care – PPO

## 2019-06-24 DIAGNOSIS — I5022 Chronic systolic (congestive) heart failure: Secondary | ICD-10-CM | POA: Diagnosis not present

## 2019-06-24 DIAGNOSIS — Z95810 Presence of automatic (implantable) cardiac defibrillator: Secondary | ICD-10-CM | POA: Diagnosis not present

## 2019-06-25 ENCOUNTER — Other Ambulatory Visit: Payer: Self-pay | Admitting: Cardiology

## 2019-06-26 NOTE — Progress Notes (Signed)
EPIC Encounter for ICM Monitoring  Patient Name: TOMAS HEATON is a 68 y.o. male Date: 06/26/2019 Primary Care Physican: Rory Percy, MD Primary Cardiologist:McDowell Electrophysiologist:Allred 06/26/2019 Weight: 219 lbs   Spoke with patient and said he is feeling fine.   He has one daughter that is [employer] [facility] nurse and the other daughter is Education officer, museum.   CorvueThoracic impedancenormal.  Prescribed:Spironolactone 25 mg take 1 tablet daily.  Recommendations:No changes and encouraged to call if experiencing any fluid symptoms.  Follow-up plan: ICM clinic phone appointment on 07/29/2019.   91 day device clinic remote transmission 10/03/2019.    Copy of ICM check sent to [name].   3 month ICM trend: 06/24/2019    1 Year ICM trend:       Rosalene Billings, RN 06/26/2019 1:34 PM

## 2019-07-04 ENCOUNTER — Ambulatory Visit (INDEPENDENT_AMBULATORY_CARE_PROVIDER_SITE_OTHER): Payer: BC Managed Care – PPO | Admitting: *Deleted

## 2019-07-04 DIAGNOSIS — I5022 Chronic systolic (congestive) heart failure: Secondary | ICD-10-CM | POA: Diagnosis not present

## 2019-07-04 DIAGNOSIS — I4891 Unspecified atrial fibrillation: Secondary | ICD-10-CM

## 2019-07-04 LAB — CUP PACEART REMOTE DEVICE CHECK
Battery Remaining Longevity: 83 mo
Battery Remaining Percentage: 80 %
Battery Voltage: 2.99 V
Brady Statistic RV Percent Paced: 1 %
Date Time Interrogation Session: 20201015081430
HighPow Impedance: 45 Ohm
HighPow Impedance: 45 Ohm
Implantable Lead Implant Date: 20020111
Implantable Lead Location: 753860
Implantable Lead Model: 148
Implantable Lead Serial Number: 116740
Implantable Pulse Generator Implant Date: 20180503
Lead Channel Impedance Value: 610 Ohm
Lead Channel Pacing Threshold Amplitude: 1.25 V
Lead Channel Pacing Threshold Pulse Width: 0.5 ms
Lead Channel Sensing Intrinsic Amplitude: 12 mV
Lead Channel Setting Pacing Amplitude: 2.5 V
Lead Channel Setting Pacing Pulse Width: 0.5 ms
Lead Channel Setting Sensing Sensitivity: 0.5 mV
Pulse Gen Serial Number: 7421148

## 2019-07-09 ENCOUNTER — Telehealth: Payer: Self-pay | Admitting: Pharmacist

## 2019-07-09 MED ORDER — APIXABAN 5 MG PO TABS: 5.0000 mg | ORAL_TABLET | Freq: Two times a day (BID) | ORAL | 5 refills | Status: DC

## 2019-07-09 NOTE — Telephone Encounter (Signed)
Called pt to discuss changing from warfarin to [place] due to better efficacy and safety data, as well as less frequent monitoring, especially given COVID-19 pandemic.   Pt would like to defer to Dr Domenic Polite but is open to changing to Eliquis if Dr Domenic Polite approves. Previous office note from 01/2018 does mention pt qualifying for DOAC therapy. Once we have the go ahead from MD, can change to Eliquis at next INR check on 10/29. Pt has Pharmacist, community and qualifies for $10/month copay card.

## 2019-07-09 NOTE — Telephone Encounter (Signed)
Called pt back, he is in agreement. Rx sent to pharmacy and $10 copay card sent to pharmacy as well, pt aware not to start Eliquis until Lattie Haw advises him at next INR check.

## 2019-07-09 NOTE — Telephone Encounter (Signed)
Thank you Megan.  We have discussed changing to [place] previously.  If he is in agreement, I would go ahead with the transition as you have outlined.

## 2019-07-18 ENCOUNTER — Ambulatory Visit (INDEPENDENT_AMBULATORY_CARE_PROVIDER_SITE_OTHER): Payer: BC Managed Care – PPO | Admitting: *Deleted

## 2019-07-18 ENCOUNTER — Other Ambulatory Visit: Payer: Self-pay

## 2019-07-18 DIAGNOSIS — I4891 Unspecified atrial fibrillation: Secondary | ICD-10-CM

## 2019-07-18 DIAGNOSIS — Z5181 Encounter for therapeutic drug level monitoring: Secondary | ICD-10-CM | POA: Diagnosis not present

## 2019-07-18 DIAGNOSIS — I480 Paroxysmal atrial fibrillation: Secondary | ICD-10-CM

## 2019-07-18 LAB — POCT INR: INR: 2.1 (ref 2.0–3.0)

## 2019-07-18 MED ORDER — WARFARIN SODIUM 5 MG PO TABS: ORAL_TABLET | ORAL | 6 refills | Status: DC

## 2019-07-18 NOTE — Patient Instructions (Signed)
Continue coumadin 1/2 tablet daily except 1 tablet on Sundays and Thursdays.   Recheck in 6 weeks 

## 2019-07-19 NOTE — Progress Notes (Signed)
Remote ICD transmission.   

## 2019-07-24 ENCOUNTER — Other Ambulatory Visit: Payer: Self-pay | Admitting: *Deleted

## 2019-07-24 MED ORDER — ENTRESTO 24-26 MG PO TABS: 1.0000 | ORAL_TABLET | Freq: Two times a day (BID) | ORAL | 2 refills | Status: DC

## 2019-07-29 ENCOUNTER — Ambulatory Visit (INDEPENDENT_AMBULATORY_CARE_PROVIDER_SITE_OTHER): Payer: BC Managed Care – PPO

## 2019-07-29 DIAGNOSIS — I5022 Chronic systolic (congestive) heart failure: Secondary | ICD-10-CM

## 2019-07-29 DIAGNOSIS — Z95810 Presence of automatic (implantable) cardiac defibrillator: Secondary | ICD-10-CM | POA: Diagnosis not present

## 2019-07-30 ENCOUNTER — Telehealth: Payer: Self-pay

## 2019-07-30 ENCOUNTER — Other Ambulatory Visit: Payer: Self-pay | Admitting: Cardiology

## 2019-07-30 NOTE — Telephone Encounter (Signed)
Remote ICM transmission received.  Attempted call to patient regarding ICM remote transmission and left detailed message per DPR.  Advised to return call for any fluid symptoms or questions. Next ICM remote transmission scheduled 09/02/2019.

## 2019-07-30 NOTE — Progress Notes (Signed)
EPIC Encounter for ICM Monitoring  Patient Name: [name] is a 68 y.o. male Date: 07/30/2019 Primary Care Physican: Rory Percy, MD Primary Cardiologist:McDowell Electrophysiologist:Allred 06/26/2019 Weight: 219lbs   Attempted call to patient and unable to reach.  Left detailed message per DPR regarding transmission. Transmission reviewed.   CorvueThoracic impedancenormal.  Prescribed:Spironolactone 25 mg take 1 tablet daily.  Recommendations:Left voice mail with ICM number and encouraged to call if experiencing any fluid symptoms.  Follow-up plan: ICM clinic phone appointment on 09/02/2019.   91 day device clinic remote transmission 10/03/2019.    Copy of ICM check sent to [name].   3 month ICM trend: 07/29/2019    1 Year ICM trend:       Rosalene Billings, RN 07/30/2019 4:51 PM

## 2019-07-31 NOTE — Progress Notes (Signed)
Patient returned call.  He is doing well except his 69 yr old brother died unexpected died last week.  He thinks he died from a heart attack but no autopsy was done.   He denies any fluid complaints and transmission reviewed.  No changes and encouraged to call if experiencing any fluid symptoms.

## 2019-08-05 NOTE — Telephone Encounter (Signed)
Called pt to follow up since he elected to continue on warfarin at his last INR check. Pt states he will likely change to Eliquis at his next office visit with Dr Domenic Polite in Jan-Feb 2021, but did not want to change his medication right before the holidays and traveling out of town since his current therapy has been working well.

## 2019-08-06 ENCOUNTER — Ambulatory Visit (INDEPENDENT_AMBULATORY_CARE_PROVIDER_SITE_OTHER): Payer: BC Managed Care – PPO | Admitting: Urology

## 2019-08-06 DIAGNOSIS — N5201 Erectile dysfunction due to arterial insufficiency: Secondary | ICD-10-CM | POA: Diagnosis not present

## 2019-08-06 DIAGNOSIS — R351 Nocturia: Secondary | ICD-10-CM

## 2019-08-06 DIAGNOSIS — N401 Enlarged prostate with lower urinary tract symptoms: Secondary | ICD-10-CM | POA: Diagnosis not present

## 2019-08-14 DIAGNOSIS — Z23 Encounter for immunization: Secondary | ICD-10-CM | POA: Diagnosis not present

## 2019-08-29 ENCOUNTER — Ambulatory Visit (INDEPENDENT_AMBULATORY_CARE_PROVIDER_SITE_OTHER): Payer: BC Managed Care – PPO | Admitting: *Deleted

## 2019-08-29 ENCOUNTER — Other Ambulatory Visit: Payer: Self-pay

## 2019-08-29 DIAGNOSIS — I480 Paroxysmal atrial fibrillation: Secondary | ICD-10-CM | POA: Diagnosis not present

## 2019-08-29 DIAGNOSIS — Z5181 Encounter for therapeutic drug level monitoring: Secondary | ICD-10-CM | POA: Diagnosis not present

## 2019-08-29 LAB — POCT INR: INR: 1.9 — AB (ref 2.0–3.0)

## 2019-08-29 NOTE — Patient Instructions (Signed)
Increase dose to 1/2 tablet daily except 1 tablet on Sundays, Tuesdays and Thursdays.   Recheck in 6 weeks Pt has decided to stay on Warfarin and not change to Eliquis at this time

## 2019-09-02 ENCOUNTER — Ambulatory Visit (INDEPENDENT_AMBULATORY_CARE_PROVIDER_SITE_OTHER): Payer: BC Managed Care – PPO

## 2019-09-02 DIAGNOSIS — Z95810 Presence of automatic (implantable) cardiac defibrillator: Secondary | ICD-10-CM | POA: Diagnosis not present

## 2019-09-02 DIAGNOSIS — I5022 Chronic systolic (congestive) heart failure: Secondary | ICD-10-CM | POA: Diagnosis not present

## 2019-09-04 NOTE — Progress Notes (Signed)
EPIC Encounter for ICM Monitoring  Patient Name: [name] is a 68 y.o. male Date: 09/04/2019 Primary Care Physican: Rory Percy, MD Primary Cardiologist:McDowell Electrophysiologist:Allred 06/26/2019 Weight:219lbs   Spoke with patient and he is asymptomatic for fluid accumulation.   CorvueThoracic impedancenormal.    Prescribed:Spironolactone 25 mg take 1 tablet daily.  Recommendations: No changes and encouraged to call if experiencing any fluid symptoms.  Follow-up plan: ICM clinic phone appointment on 10/08/2019.   91 day device clinic remote transmission 10/07/2019.      Copy of ICM check sent to [name].   3 month ICM trend: 09/02/2019    1 Year ICM trend:       Rosalene Billings, RN 09/04/2019 9:49 AM

## 2019-10-07 ENCOUNTER — Ambulatory Visit (INDEPENDENT_AMBULATORY_CARE_PROVIDER_SITE_OTHER): Payer: BC Managed Care – PPO | Admitting: *Deleted

## 2019-10-07 DIAGNOSIS — I5022 Chronic systolic (congestive) heart failure: Secondary | ICD-10-CM

## 2019-10-09 ENCOUNTER — Telehealth: Payer: Self-pay

## 2019-10-09 ENCOUNTER — Ambulatory Visit (INDEPENDENT_AMBULATORY_CARE_PROVIDER_SITE_OTHER): Payer: BC Managed Care – PPO

## 2019-10-09 DIAGNOSIS — Z95810 Presence of automatic (implantable) cardiac defibrillator: Secondary | ICD-10-CM | POA: Diagnosis not present

## 2019-10-09 DIAGNOSIS — I5022 Chronic systolic (congestive) heart failure: Secondary | ICD-10-CM

## 2019-10-09 LAB — CUP PACEART REMOTE DEVICE CHECK
Battery Remaining Longevity: 80 mo
Battery Remaining Percentage: 77 %
Battery Voltage: 2.98 V
Brady Statistic RV Percent Paced: 1 %
Date Time Interrogation Session: 20210120074427
HighPow Impedance: 46 Ohm
HighPow Impedance: 46 Ohm
Implantable Lead Implant Date: 20020111
Implantable Lead Location: 753860
Implantable Lead Model: 148
Implantable Lead Serial Number: 116740
Implantable Pulse Generator Implant Date: 20180503
Lead Channel Impedance Value: 660 Ohm
Lead Channel Pacing Threshold Amplitude: 1.25 V
Lead Channel Pacing Threshold Pulse Width: 0.5 ms
Lead Channel Sensing Intrinsic Amplitude: 12 mV
Lead Channel Setting Pacing Amplitude: 2.5 V
Lead Channel Setting Pacing Pulse Width: 0.5 ms
Lead Channel Setting Sensing Sensitivity: 0.5 mV
Pulse Gen Serial Number: 7421148

## 2019-10-09 NOTE — Progress Notes (Signed)
EPIC Encounter for ICM Monitoring  Patient Name: [name] is a 69 y.o. male Date: 10/09/2019 Primary Care Physican: Rory Percy, MD Primary Cardiologist:McDowell Electrophysiologist:Allred 10/09/2019 Weight:224lb (220 lb baseline)   Spoke with patient and he did gain a couple of pounds during a [place] trip to the mountains and was eating more restaurant foods than usual.  He is back on track with his diet.  CorvueThoracic impedancesuggesting fluid accumulation since 10/05/2019 but trending back to baseline normal.    Prescribed:  Spironolactone 25 mg take 1 tablet daily.  Recommendations: Reinforced limiting salt intake and encouraged to call if experiencing fluid symptoms.  Follow-up plan: ICM clinic phone appointment on 10/21/2019 to recheck fluid levels.   91 day device clinic remote transmission 01/06/2020.      Copy of ICM check sent to [name] and Dr Domenic Polite.   3 month ICM trend: 10/09/2019    1 Year ICM trend:       Rosalene Billings, RN 10/09/2019 8:50 AM

## 2019-10-09 NOTE — Telephone Encounter (Signed)
Remote ICM transmission received.  Attempted call to patient regarding ICM remote transmission and left detailed message per DPR to return call.  Advised to return call for any fluid symptoms or questions.      

## 2019-10-10 ENCOUNTER — Ambulatory Visit (INDEPENDENT_AMBULATORY_CARE_PROVIDER_SITE_OTHER): Payer: BC Managed Care – PPO | Admitting: *Deleted

## 2019-10-10 ENCOUNTER — Other Ambulatory Visit: Payer: Self-pay

## 2019-10-10 DIAGNOSIS — I480 Paroxysmal atrial fibrillation: Secondary | ICD-10-CM

## 2019-10-10 DIAGNOSIS — Z5181 Encounter for therapeutic drug level monitoring: Secondary | ICD-10-CM | POA: Diagnosis not present

## 2019-10-10 LAB — POCT INR: INR: 3.2 — AB (ref 2.0–3.0)

## 2019-10-10 NOTE — Patient Instructions (Signed)
Take warfarin 1/2 tablet tonight then resume 1/2 tablet daily except 1 tablet on Sundays, Tuesdays and Thursdays.  Started Vitamin D,C, fruit/veggie supplement Recheck in 3 weeks Pt has decided to stay on Warfarin and not change to Eliquis at this time

## 2019-10-21 ENCOUNTER — Ambulatory Visit (INDEPENDENT_AMBULATORY_CARE_PROVIDER_SITE_OTHER): Payer: BC Managed Care – PPO

## 2019-10-21 DIAGNOSIS — Z95810 Presence of automatic (implantable) cardiac defibrillator: Secondary | ICD-10-CM

## 2019-10-21 DIAGNOSIS — I5022 Chronic systolic (congestive) heart failure: Secondary | ICD-10-CM

## 2019-10-22 ENCOUNTER — Telehealth: Payer: Self-pay

## 2019-10-22 NOTE — Progress Notes (Signed)
EPIC Encounter for ICM Monitoring  Patient Name: [name] is a 69 y.o. male Date: 10/22/2019 Primary Care Physican: Rory Percy, MD Primary Cardiologist:McDowell Electrophysiologist:Allred 10/22/2019 Weight:220 lb baseline   Spoke with patient and he is doing well.  Fluid levels returned to normal after adjusting diet.    CorvueThoracic impedancesuggesting fluid accumulation since 10/05/2019 but trending back to baseline normal.   Prescribed:  Spironolactone 25 mg take 1 tablet daily.  Recommendations: No changes and encouraged to call if experiencing any fluid symptoms.  Follow-up plan: ICM clinic phone appointment on 11/11/2019 to recheck fluid levels.   91 day device clinic remote transmission 01/06/2020.      Copy of ICM check sent to [name]   3 month ICM trend: 10/21/2019    1 Year ICM trend:       Rosalene Billings, RN 10/22/2019 1:44 PM

## 2019-10-22 NOTE — Telephone Encounter (Signed)
Left message for patient to remind of missed remote transmission.  

## 2019-10-24 ENCOUNTER — Encounter: Payer: Self-pay | Admitting: *Deleted

## 2019-10-24 NOTE — Progress Notes (Signed)
Notification received via fax from [place] that entresto 24/26 mg prior [name] is approved for one full year starting today 10/24/2019 through 10/23/2020.

## 2019-10-25 ENCOUNTER — Telehealth: Payer: Self-pay | Admitting: Cardiology

## 2019-10-25 NOTE — Telephone Encounter (Signed)
Patient's daughter called asking about his [name]  Stated he is almost out and the pharmacy told him that he needed authorization

## 2019-10-25 NOTE — Telephone Encounter (Signed)
Message left on vm that entresto approval received on 10/24/2019. Advised that [facility] should run entresot through again to see if it goes through. Also advised that notification information will be faxed to pharmacy.

## 2019-10-31 ENCOUNTER — Ambulatory Visit (INDEPENDENT_AMBULATORY_CARE_PROVIDER_SITE_OTHER): Payer: BC Managed Care – PPO | Admitting: *Deleted

## 2019-10-31 ENCOUNTER — Other Ambulatory Visit: Payer: Self-pay

## 2019-10-31 DIAGNOSIS — I4891 Unspecified atrial fibrillation: Secondary | ICD-10-CM

## 2019-10-31 DIAGNOSIS — I480 Paroxysmal atrial fibrillation: Secondary | ICD-10-CM | POA: Diagnosis not present

## 2019-10-31 DIAGNOSIS — Z23 Encounter for immunization: Secondary | ICD-10-CM | POA: Diagnosis not present

## 2019-10-31 DIAGNOSIS — Z5181 Encounter for therapeutic drug level monitoring: Secondary | ICD-10-CM | POA: Diagnosis not present

## 2019-10-31 LAB — POCT INR: INR: 2.1 (ref 2.0–3.0)

## 2019-10-31 NOTE — Patient Instructions (Signed)
Continue warfarin 1/2 tablet daily except 1 tablet on Sundays, Tuesdays and Thursdays.  Started Vitamin D,C, fruit/veggie supplement Recheck in 4 weeks Pt has decided to stay on Warfarin and not change to Eliquis at this time

## 2019-11-11 ENCOUNTER — Ambulatory Visit (INDEPENDENT_AMBULATORY_CARE_PROVIDER_SITE_OTHER): Payer: BC Managed Care – PPO

## 2019-11-11 DIAGNOSIS — I5022 Chronic systolic (congestive) heart failure: Secondary | ICD-10-CM

## 2019-11-11 DIAGNOSIS — Z95810 Presence of automatic (implantable) cardiac defibrillator: Secondary | ICD-10-CM

## 2019-11-13 NOTE — Progress Notes (Signed)
EPIC Encounter for ICM Monitoring  Patient Name: [name] is a 69 y.o. male Date: 11/13/2019 Primary Care Physican: Rory Percy, MD Primary Cardiologist:[facility] Electrophysiologist:Allred 2/2/2021Weight:220 lb baseline   Spoke with patient and he is doing well. He denies any fluid symptoms.    CorvueThoracic impedancenormal.   Prescribed:  Spironolactone 25 mg take 1 tablet daily.  Recommendations:No changes and encouraged to call if experiencing any fluid symptoms.  Follow-up plan: ICM clinic phone appointment on3/29/2021. 91 day device clinic remote transmission 01/06/2020. Office visit with Oda Kilts, PA on 12/02/2019.  Dr Domenic Polite on 12/05/2019.   Copy of ICM check sent to Dr.Allred   3 month ICM trend: 11/11/2019    1 Year ICM trend:       Rosalene Billings, RN 11/13/2019 5:00 PM

## 2019-11-28 ENCOUNTER — Ambulatory Visit (INDEPENDENT_AMBULATORY_CARE_PROVIDER_SITE_OTHER): Payer: BC Managed Care – PPO | Admitting: *Deleted

## 2019-11-28 ENCOUNTER — Other Ambulatory Visit: Payer: Self-pay

## 2019-11-28 DIAGNOSIS — I4891 Unspecified atrial fibrillation: Secondary | ICD-10-CM | POA: Diagnosis not present

## 2019-11-28 DIAGNOSIS — Z5181 Encounter for therapeutic drug level monitoring: Secondary | ICD-10-CM | POA: Diagnosis not present

## 2019-11-28 DIAGNOSIS — I480 Paroxysmal atrial fibrillation: Secondary | ICD-10-CM

## 2019-11-28 LAB — POCT INR: INR: 2.5 (ref 2.0–3.0)

## 2019-11-28 NOTE — Patient Instructions (Signed)
Continue warfarin 1/2 tablet daily except 1 tablet on Sundays, Tuesdays and Thursdays.  On Vitamin D,C, fruit/veggie supplement Recheck in 4 weeks Pt has decided to stay on Warfarin and not change to Eliquis at this time

## 2019-11-29 DIAGNOSIS — Z23 Encounter for immunization: Secondary | ICD-10-CM | POA: Diagnosis not present

## 2019-11-29 NOTE — Progress Notes (Signed)
Electrophysiology Office Note Date: 12/02/2019  ID:  Marcus MAXIM, DOB [birth year], MRN [id number]  PCP: Rory Percy, MD Primary Cardiologist: Rozann Lesches, MD Electrophysiologist: Thompson Grayer, MD   CC: Routine ICD follow-up  [name] is a 69 y.o. male seen for [name].  They present today for routine electrophysiology followup.  Since last being seen in our clinic, the patient reports doing very well. They deny chest pain, palpitations, dyspnea, PND, orthopnea, nausea, vomiting, dizziness, syncope, edema, weight gain, or early satiety.  He has not had ICD shocks.   He is currently doing everything he wants to do without any limitations. He plans to go home and lime/fertilize his yard after this visit.  He does occasionally get mild SOB when walking several laps around the park, but attributes this to his normal physical state.   Device History: Medtronic Single Chamber ICD implanted 2006, gen change 2011, gen change 2018 for primary prevention History of appropriate therapy: No History of AAD therapy: No   Past Medical History:  Diagnosis Date  . A-V fistula ([place])   . AICD (automatic cardioverter/defibrillator) present 2006   ICD  . Anterior myocardial infarction ([place])   . 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 Chronic systolic heart failure ([place])   . Coronary atherosclerosis of native coronary artery    a. s/p BMS to LAD and OM1 b. patent stents by cath in 2013  . History of hiatal hernia   . Hypertension   . Inducible ventricular tachycardia ([place])   . Ischemic cardiomyopathy    LVEF 30%, status post ICD  . OSA on CPAP   . Paroxysmal atrial fibrillation (H[facility])    On coumadin  . Pseudoaneurysm ([place])   . SCC (squamous cell carcinoma) 08/04/2006   right shoulder (CX35FU)  . SCC (squamous cell carcinoma) well differentiated 12/13/2011   right temple  . Squamous cell carcinoma in situ (SCCIS) 08/04/2006   right sholder    . Stroke [facility])    Past Surgical History:  Procedure Laterality Date  . AICD implantation  2011   St Jude ICD implanted for primary prevention of sudden death  . CATARACT EXTRACTION W/PHACO Left 03/27/2017   Procedure: CATARACT EXTRACTION PHACO AND INTRAOCULAR LENS PLACEMENT (IOC);  Surgeon: [name], MD;  Location: [facility];  Service: Ophthalmology;  Laterality: Left;  CDE: 8.48  . CATARACT EXTRACTION W/PHACO Right 05/29/2017   Procedure: CATARACT EXTRACTION PHACO AND INTRAOCULAR LENS PLACEMENT (IOC);  Surgeon: [name], MD;  Location: [facility];  Service: Ophthalmology;  Laterality: Right;  CDE: 7.48  . EYE SURGERY    . ICD GENERATOR CHANGEOUT N/A 01/19/2017   SJM Ellipse VR ICD generator change by Dr [name]  . LAPAROSCOPY N/A 10/23/2017   Procedure: LAPAROSCOPY DIAGNOSTIC;  Surgeon: [name], MD;  Location: [facility];  Service: General;  Laterality: N/A;  . LEFT HEART CATH AND CORONARY ANGIOGRAPHY N/A 06/21/2018   Procedure: LEFT HEART CATH AND CORONARY ANGIOGRAPHY;  Surgeon: [name], MD;  Location: [facility];  Service: Cardiovascular;  Laterality: N/A;  . VENTRAL HERNIA REPAIR N/A 10/23/2017   Procedure: VENTRAL HERNIA REPAIR;  Surgeon: [name], MD;  Location: [facility];  Service: General;  Laterality: N/A;    Current Outpatient Medications  Medication Sig Dispense Refill  . aspirin 81 MG tablet Take 81 mg by mouth daily.      . bisoprolol (ZEBETA) 5 MG tablet TAKE 1 TABLET BY MOUTH  EVERY DAY 90 tablet 2  . finasteride (PROSCAR) 5 MG tablet Take 5 mg by mouth Daily.     . nitroGLYCERIN (NITROSTAT) 0.4 MG SL tablet Place 1 tablet (0.4 mg total) under the tongue every 5 (five) minutes as needed for chest pain. 25 tablet 0  . rosuvastatin (CRESTOR) 20 MG tablet TAKE 1 TABLET (20 MG TOTAL) BY MOUTH DAILY. 90 tablet 2  . sacubitril-valsartan (ENTRESTO) 24-26 MG Take 1 tablet by mouth 2 (two) times daily. 180 tablet 2  . spironolactone (ALDACTONE) 25 MG tablet TAKE 1  TABLET BY MOUTH DAILY 90 tablet 3  . warfarin (COUMADIN) 5 MG tablet TAKE 1/2 TABLET DAILY EXCEPT 1 TABLET ON SUNDAYS AND THURSDAYS 54 tablet 1   No current facility-administered medications for this visit.    Allergies:   Patient has no known allergies.   Social History: Social History   Socioeconomic History  . Marital status: Married    Spouse name: Not on file  . Number of children: Not on file  . Years of education: Not on file  . Highest education level: Not on file  Occupational History  . Not on file  Tobacco Use  . Smoking status: Never Smoker  . Smokeless tobacco: Never Used  Substance and Sexual Activity  . Alcohol use: Yes    Alcohol/week: 0.0 standard drinks    Comment: occasional  . Drug use: No  . Sexual activity: Not on file  Other Topics Concern  . Not on file  Social History Narrative  . Not on file   Social Determinants of Health   Financial Resource Strain:   . Difficulty of Paying Living Expenses:   Food Insecurity:   . Worried About Charity fundraiser in the Last Year:   . Arboriculturist in the Last Year:   Transportation Needs:   . Film/video editor (Medical):   [name] Lack of Transportation (Non-Medical):   Physical Activity:   . Days of Exercise per Week:   . Minutes of Exercise per Session:   Stress:   . Feeling of Stress :   Social Connections:   . Frequency of Communication with Friends and Family:   . Frequency of Social Gatherings with Friends and Family:   . Attends Religious Services:   . Active Member of Clubs or Organizations:   . Attends Archivist Meetings:   [name] Marital Status:   Intimate Partner Violence:   . Fear of Current or Ex-Partner:   . Emotionally Abused:   [name] Physically Abused:   . Sexually Abused:    Family History: Family History  Problem Relation Age of Onset  . Cancer Mother        unknown type  . Heart attack Father   . Heart disease Brother    Review of Systems: All other systems  reviewed and are otherwise negative except as noted above.  Physical Exam: Vitals:   12/02/19 0931  BP: (!) 82/52  Pulse: 77  SpO2: 94%  Weight: 233 lb 6.4 oz (105.9 kg)  Height: 6\' 1"  (1.854 m)     GEN- The patient is well appearing, alert and oriented x 3 today.   HEENT: normocephalic, atraumatic; sclera clear, conjunctiva pink; hearing intact; oropharynx clear; neck supple, no JVP Lymph- no cervical lymphadenopathy Lungs- Clear to ausculation bilaterally, normal work of breathing.  No wheezes, rales, rhonchi Heart- Regular rate and rhythm, no murmurs, rubs or gallops, PMI not laterally displaced GI- soft, non-tender, non-distended,  bowel sounds present, no hepatosplenomegaly Extremities- no clubbing, cyanosis, or edema; DP/PT/radial pulses 2+ bilaterally MS- no significant deformity or atrophy Skin- warm and dry, no rash or lesion; ICD pocket well healed Psych- euthymic mood, full affect Neuro- strength and sensation are intact  ICD interrogation- reviewed in detail today,  See PACEART report  EKG:  EKG is ordered today. The ekg ordered today shows underlying AF, ventricular rates controlled at 77 bpm  Recent Labs: No results found for requested labs within last 8760 hours.   Wt Readings from Last 3 Encounters:  12/02/19 233 lb 6.4 oz (105.9 kg)  05/29/19 219 lb (99.3 kg)  05/13/19 227 lb (103 kg)     Other studies Reviewed: Additional studies/ records that were reviewed today include: Previous office notes, previous remote checks, most recent labs, most recent echo, gen change note from 2018.   Assessment and Plan:  1.  Chronic systolic dysfunction s/p St. Jude single chamber ICD  euvolemic today Stable on an appropriate medical regimen Continue bisoprolol, Entresto, and Spironolactone. No room to up-titrate Normal ICD function See Claudia Desanctis Art report No changes today NYHA I-II symptoms Volume status stable on exam. QRS 90 ms Echo 05/2019 LVEF ~30% Body mass index  is 30.79 kg/m.  Will Pro-BNP for baseline.   2. Persistent AF Rate controlled Doing well Continue coumadin for CHA2DS2VASC of at least 4    Current medicines are reviewed at length with the patient today.   The patient does not have concerns regarding his medicines.  The following changes were made today:  none  Labs/ tests ordered today include:  Orders Placed This Encounter  Procedures  . Basic Metabolic Panel (BMET)  . Pro b natriuretic peptide (BNP)  . CUP PACEART [place]  . EKG 12-Lead    Disposition:   Follow up with as scheduled with [name]. He is not currently a Barostim candidate based on NYHA status of 1, possible 2. No NYHA 3 symptoms.   Jacalyn Lefevre, PA-C  12/02/2019 10:47 AM  [facility] [address] [place] [address] [phone] (office) [phone] (fax)

## 2019-12-02 ENCOUNTER — Other Ambulatory Visit: Payer: Self-pay

## 2019-12-02 ENCOUNTER — Ambulatory Visit: Payer: BC Managed Care – PPO | Admitting: Student

## 2019-12-02 ENCOUNTER — Encounter: Payer: Self-pay | Admitting: Student

## 2019-12-02 VITALS — BP 82/52 | HR 77 | Ht 73.0 in | Wt 233.4 lb

## 2019-12-02 DIAGNOSIS — I5022 Chronic systolic (congestive) heart failure: Secondary | ICD-10-CM | POA: Diagnosis not present

## 2019-12-02 DIAGNOSIS — Z95810 Presence of automatic (implantable) cardiac defibrillator: Secondary | ICD-10-CM

## 2019-12-02 DIAGNOSIS — I4891 Unspecified atrial fibrillation: Secondary | ICD-10-CM | POA: Diagnosis not present

## 2019-12-02 LAB — CUP PACEART INCLINIC DEVICE CHECK
Battery Remaining Longevity: 78 mo
Brady Statistic RV Percent Paced: 0.2 %
Date Time Interrogation Session: 20210315102159
HighPow Impedance: 50.8545
Implantable Lead Implant Date: 20020111
Implantable Lead Location: 753860
Implantable Lead Model: 148
Implantable Lead Serial Number: 116740
Implantable Pulse Generator Implant Date: 20180503
Lead Channel Impedance Value: 687.5 Ohm
Lead Channel Pacing Threshold Amplitude: 0.75 V
Lead Channel Pacing Threshold Amplitude: 0.75 V
Lead Channel Pacing Threshold Pulse Width: 0.5 ms
Lead Channel Pacing Threshold Pulse Width: 0.5 ms
Lead Channel Sensing Intrinsic Amplitude: 12 mV
Lead Channel Setting Pacing Amplitude: 2.5 V
Lead Channel Setting Pacing Pulse Width: 0.5 ms
Lead Channel Setting Sensing Sensitivity: 0.5 mV
Pulse Gen Serial Number: 7421148

## 2019-12-02 NOTE — Patient Instructions (Addendum)
Medication Instructions:  none *If you need a refill on your cardiac medications before your next appointment, please call your pharmacy*   Lab Work:  TODAY BMET BNP If you have labs (blood work) drawn today and your tests are completely normal, you will receive your results only by: [name] MyChart Message (if you have MyChart) OR . A paper copy in the mail If you have any lab test that is abnormal or we need to change your treatment, we will call you to review the results.   Testing/Procedures: none   Follow-Up: At [facility], you and your health needs are our priority.  As part of our continuing mission to provide you with exceptional heart care, we have created designated Provider Care Teams.  These Care Teams include your primary Cardiologist (physician) and Advanced Practice Providers (APPs -  Physician Assistants and Nurse Practitioners) who all work together to provide you with the care you need, when you need it.    Other Instructions Remote monitoring is used to monitor your  ICD from home. This monitoring reduces the number of office visits required to check your device to one time per year. It allows [place] to keep an eye on the functioning of your device to ensure it is working properly. You are scheduled for a device check from home on 01/06/20. You may send your transmission at any time that day. If you have a wireless device, the transmission will be sent automatically. After your physician reviews your transmission, you will receive a postcard with your next transmission date.

## 2019-12-03 LAB — BASIC METABOLIC PANEL
BUN/Creatinine Ratio: 16 (ref 10–24)
BUN: 20 mg/dL (ref 8–27)
CO2: 23 mmol/L (ref 20–29)
Calcium: 9.5 mg/dL (ref 8.6–10.2)
Chloride: 102 mmol/L (ref 96–106)
Creatinine, Ser: 1.26 mg/dL (ref 0.76–1.27)
GFR calc Af Amer: 67 mL/min/{1.73_m2} (ref >59)
GFR calc non Af Amer: 58 mL/min/{1.73_m2} — ABNORMAL LOW (ref >59)
Glucose: 103 mg/dL — ABNORMAL HIGH (ref 65–99)
Potassium: 4.7 mmol/L (ref 3.5–5.2)
Sodium: 139 mmol/L (ref 134–144)

## 2019-12-03 LAB — PRO B NATRIURETIC PEPTIDE: NT-Pro BNP: 817 pg/mL — ABNORMAL HIGH (ref 0–376)

## 2019-12-05 ENCOUNTER — Encounter: Payer: Self-pay | Admitting: Cardiology

## 2019-12-05 ENCOUNTER — Ambulatory Visit (INDEPENDENT_AMBULATORY_CARE_PROVIDER_SITE_OTHER): Payer: BC Managed Care – PPO | Admitting: Cardiology

## 2019-12-05 ENCOUNTER — Other Ambulatory Visit: Payer: Self-pay

## 2019-12-05 VITALS — BP 88/58 | HR 72 | Ht 73.0 in | Wt 230.0 lb

## 2019-12-05 DIAGNOSIS — I255 Ischemic cardiomyopathy: Secondary | ICD-10-CM | POA: Diagnosis not present

## 2019-12-05 DIAGNOSIS — I5022 Chronic systolic (congestive) heart failure: Secondary | ICD-10-CM

## 2019-12-05 DIAGNOSIS — I25119 Atherosclerotic heart disease of native coronary artery with unspecified angina pectoris: Secondary | ICD-10-CM | POA: Diagnosis not present

## 2019-12-05 DIAGNOSIS — I4819 Other persistent atrial fibrillation: Secondary | ICD-10-CM | POA: Diagnosis not present

## 2019-12-05 NOTE — Patient Instructions (Signed)
Medication Instructions:   Your physician recommends that you continue on your current medications as directed. Please refer to the Current Medication list given to you today.  Labwork:  Your physician recommends that you return for non-fasting lab work in: 6 months just before your next visit to check your BMET.   Testing/Procedures:  NONE  Follow-Up:  Your physician recommends that you schedule a follow-up appointment in: 6 month (office).  Any Other Special Instructions Will Be Listed Below (If Applicable).  If you need a refill on your cardiac medications before your next appointment, please call your pharmacy.

## 2019-12-05 NOTE — Progress Notes (Signed)
Cardiology Office Note  Date: 12/05/2019   ID: [name], DOB 1950/09/27, MRN [id number]  PCP:  Marcus Percy, MD  Cardiologist:  Marcus Lesches, MD Electrophysiologist:  Marcus Grayer, MD   Chief Complaint  Patient presents with  . Cardiac follow-up    History of Present Illness: [name] is a 69 y.o. male last seen in August 2020.  He presents for a routine visit.  Continues to do well, reports NYHA class I-II dyspnea, no exertional chest pain, no palpitations or syncope.  He continues on Coumadin with follow-up in the anticoagulation clinic. Recent INR therapeutic at 2.5.  He has had no spontaneous bleeding problems.  He sees Dr. Rayann Smith in the device clinic, [name] in place.  He had a recent visit in the EP clinic on March 15, I reviewed the note.  He saw [name] who also obtained lab work as outlined below.  Thoracic impedance in February was normal.  [name] does not report any significant weight gain, no orthopnea or PND.  BNP was 817, creatinine 1.26, potassium 4.7.  I reviewed his medications, cardiac regimen remained stable.  Only standing diuretic at this time is Aldactone.  He did have a follow-up echocardiogram in September 2020 which showed stable LVEF at 25 to 30%, no substantial improvement on Entresto.  Past Medical History:  Diagnosis Date  . AICD (automatic cardioverter/defibrillator) present 2006   Smith  . Anterior myocardial infarction ([place])   . BCC (basal cell carcinoma) superficial 11/22/2016   Left back sholder  . Chronic systolic heart failure ([place])   . Coronary atherosclerosis of native coronary artery    a. s/p BMS to LAD and OM1 b. patent stents by cath in 2013  . History of hiatal hernia   . Hypertension   . Inducible ventricular tachycardia ([place])   . Ischemic cardiomyopathy    LVEF 30%, status post Smith  . OSA on CPAP   . Paroxysmal atrial fibrillation (H[facility])    On coumadin  . Pseudoaneurysm ([place])   . SCC (squamous  cell carcinoma) 08/04/2006   right shoulder (CX35FU)  . SCC (squamous cell carcinoma) well differentiated 12/13/2011   right temple  . 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 Stroke [facility])     Past Surgical History:  Procedure Laterality Date  . AICD implantation  2011   St Jude Smith implanted for primary prevention of sudden death  . CATARACT EXTRACTION W/PHACO Left 03/27/2017   Procedure: CATARACT EXTRACTION PHACO AND INTRAOCULAR LENS PLACEMENT (IOC);  Surgeon: [name], MD;  Location: [facility];  Service: Ophthalmology;  Laterality: Left;  CDE: 8.48  . CATARACT EXTRACTION W/PHACO Right 05/29/2017   Procedure: CATARACT EXTRACTION PHACO AND INTRAOCULAR LENS PLACEMENT (IOC);  Surgeon: [name], MD;  Location: [facility];  Service: Ophthalmology;  Laterality: Right;  CDE: 7.48  . EYE SURGERY    . Smith GENERATOR CHANGEOUT N/A 01/19/2017   SJM Ellipse VR Smith generator change by Dr [name]  . LAPAROSCOPY N/A 10/23/2017   Procedure: LAPAROSCOPY DIAGNOSTIC;  Surgeon: [name], MD;  Location: [facility];  Service: General;  Laterality: N/A;  . LEFT HEART CATH AND CORONARY ANGIOGRAPHY N/A 06/21/2018   Procedure: LEFT HEART CATH AND CORONARY ANGIOGRAPHY;  Surgeon: [name], MD;  Location: [facility];  Service: Cardiovascular;  Laterality: N/A;  . VENTRAL HERNIA REPAIR N/A 10/23/2017   Procedure: VENTRAL HERNIA REPAIR;  Surgeon: [name], MD;  [facility]: [facility];  Service: General;  Laterality: N/A;    Current Outpatient Medications  Medication Sig Dispense Refill  . aspirin 81 MG tablet Take 81 mg by mouth daily.      . bisoprolol (ZEBETA) 5 MG tablet TAKE 1 TABLET BY MOUTH EVERY DAY 90 tablet 2  . finasteride (PROSCAR) 5 MG tablet Take 5 mg by mouth Daily.     . nitroGLYCERIN (NITROSTAT) 0.4 MG SL tablet Place 1 tablet (0.4 mg total) under the tongue every 5 (five) minutes as needed for chest pain. 25 tablet 0  . rosuvastatin (CRESTOR) 20 MG tablet  TAKE 1 TABLET (20 MG TOTAL) BY MOUTH DAILY. 90 tablet 2  . sacubitril-valsartan (ENTRESTO) 24-26 MG Take 1 tablet by mouth 2 (two) times daily. 180 tablet 2  . spironolactone (ALDACTONE) 25 MG tablet TAKE 1 TABLET BY MOUTH DAILY 90 tablet 3  . warfarin (COUMADIN) 5 MG tablet TAKE 1/2 TABLET DAILY EXCEPT 1 TABLET ON SUNDAYS AND THURSDAYS 54 tablet 1   No current facility-administered medications for this visit.   Allergies:  Patient has no known allergies.   ROS:   No palpitations or syncope.  Physical Exam: VS:  BP (!) 88/58   Pulse 72   Ht 6\' 1"  (1.854 m)   Wt 230 lb (104.3 kg)   SpO2 96%   BMI 30.34 kg/m , BMI Body mass index is 30.34 kg/m.  Wt Readings from Last 3 Encounters:  12/05/19 230 lb (104.3 kg)  12/02/19 233 lb 6.4 oz (105.9 kg)  05/29/19 219 lb (99.3 kg)    General: Patient appears comfortable at rest. HEENT: Conjunctiva and lids normal, wearing a mask. Neck: Supple, no elevated JVP. Lungs: Clear to auscultation, nonlabored breathing at rest. Cardiac: Indistinct PMI, regular rate and rhythm, no S3 or significant systolic murmur. Abdomen: Soft, nontender, bowel sounds present. Extremities: No pitting edema, distal pulses 2+.  ECG:  An ECG dated 12/02/2019 was personally reviewed today and demonstrated:  Rate controlled atrial fibrillation with old anterior infarct pattern and PVC.  Recent Labwork: 12/02/2019: BUN 20; Creatinine, Ser 1.26; NT-Pro BNP 817; Potassium 4.7; Sodium 139   Other Studies Reviewed Today:  Cardiac catheterization 06/21/2018:  There is severe left ventricular systolic dysfunction. The left ventricular ejection fraction is 25-35% by visual estimate.  LV end diastolic pressure is normal.  Minimal disease with underlying two-vessel disease involving LAD and circumflex:  Ost Cx to Prox Cx stent is 5% stenosed.  Prox LAD stent is 10% stenosed.  Mid Cx lesion is 40% stenosed.  Angiographically minimal disease with widely patent stents  in the circumflex and LAD. Severely reduced LVEF of 30% with regional wall motion normality in the anterior and inferior apex with swirling of flow. Normal LVEDP  Echocardiogram 06/12/2019: 1. Left ventricular ejection fraction, by visual estimation, is 25 to  30%. The endocardium is poorly visualized limiting evaluation of LVEF,  consider contrast study.The left ventricle has severely decreased  function. There is no left ventricular  hypertrophy.  2. Left ventricular diastolic Doppler parameters are indeterminate  pattern of LV diastolic filling.  3. The left ventricle was not well visualized.  4. The anteroseptal, anterior, anterolateral walls are akinetic. 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 Poor visualization, cannot  evaluate sufficiently for thrombus, consider possible contrast study.  5. Global right ventricle has normal systolic function.The right  ventricular size is normal. No increase in right ventricular wall  thickness.  6. Left atrial size was severely dilated.  7. Right atrial size was mildly dilated.  8. The pericardium was not well visualized.  9. The mitral valve is normal in structure. Mild mitral valve  regurgitation. No evidence of mitral stenosis.  10. The tricuspid valve is normal in structure. Tricuspid valve  regurgitation is mild.  11. The aortic valve is tricuspid Aortic valve regurgitation is mild by  color flow Doppler. Structurally normal aortic valve, with no evidence of  sclerosis or stenosis.  12. The pulmonic valve was not well visualized. Pulmonic valve  regurgitation is not visualized by color flow Doppler.  13. The aortic root was not well visualized.  14. There is mild dilatation of the aortic root measuring 40 mm.  15. Normal pulmonary artery systolic pressure.  16. A pacer wire is visualized.  17. The interatrial septum was not well visualized.   Assessment and Plan:  1.  Ischemic cardiomyopathy with LVEF approximately  30% by most recent echocardiogram and chronic systolic heart failure.  He continues to do well, NYHA class I-II dyspnea.  Weight is stable in comparison to recent EP office visit although higher than in September of last year.  He does not describe any orthopnea or PND, no leg swelling, recent thoracic impedance was normal.  Baseline BNP checked recently by [name], not clear that this changes our coarse at this time however and I would expect that this will remain abnormal chronically.  At this point he is not on a standing diuretic other than Aldactone, continue to follow in case he needs a low dose of Lasix, although would be careful given his low blood pressure at baseline.  Continue Zebeta, Entresto, and Aldactone.  Follow-up BMET for next visit.  2.  CAD status post previous anterior infarct, no active angina symptoms.  He continues on low-dose aspirin and Crestor.  3.  St. Jude Smith in place.  No device discharges or syncope.  Keep follow-up with Dr. Rayann Smith.  4.  Persistent atrial fibrillation, heart rate controlled.  Continue Zebeta and Coumadin with follow-up in anticoagulation clinic.  Medication Adjustments/Labs and Tests Ordered: Current medicines are reviewed at length with the patient today.  Concerns regarding medicines are outlined above.   Tests Ordered: Orders Placed This Encounter  Procedures  . Basic metabolic panel    Medication Changes: No orders of the defined types were placed in this encounter.   Disposition:  Follow up 6 months in the [place] office.  Signed, Satira Sark, MD, [facility] 12/05/2019 2:53 PM    [facility] [place], [place], [place] [address] Phone: [phone]; Fax: [phone]

## 2019-12-26 ENCOUNTER — Ambulatory Visit (INDEPENDENT_AMBULATORY_CARE_PROVIDER_SITE_OTHER): Payer: BC Managed Care – PPO | Admitting: *Deleted

## 2019-12-26 ENCOUNTER — Other Ambulatory Visit: Payer: Self-pay

## 2019-12-26 DIAGNOSIS — Z5181 Encounter for therapeutic drug level monitoring: Secondary | ICD-10-CM

## 2019-12-26 DIAGNOSIS — I480 Paroxysmal atrial fibrillation: Secondary | ICD-10-CM

## 2019-12-26 LAB — POCT INR: INR: 3 (ref 2.0–3.0)

## 2019-12-26 NOTE — Patient Instructions (Signed)
Continue warfarin 1/2 tablet daily except 1 tablet on Sundays, Tuesdays and Thursdays.  On Vitamin D,C, fruit/veggie supplement Recheck in 6 weeks Pt has decided to stay on Warfarin and not change to Eliquis at this time

## 2019-12-27 NOTE — Progress Notes (Signed)
No ICM remote transmission received for 12/16/2019 and next ICM transmission scheduled for 01/07/2020.

## 2020-01-06 ENCOUNTER — Ambulatory Visit (INDEPENDENT_AMBULATORY_CARE_PROVIDER_SITE_OTHER): Payer: BC Managed Care – PPO | Admitting: *Deleted

## 2020-01-06 DIAGNOSIS — I5022 Chronic systolic (congestive) heart failure: Secondary | ICD-10-CM | POA: Diagnosis not present

## 2020-01-06 LAB — CUP PACEART REMOTE DEVICE CHECK
Battery Remaining Longevity: 78 mo
Battery Remaining Percentage: 76 %
Battery Voltage: 2.98 V
Brady Statistic RV Percent Paced: 1 %
Date Time Interrogation Session: 20210419040036
HighPow Impedance: 49 Ohm
HighPow Impedance: 49 Ohm
Implantable Lead Implant Date: 20020111
Implantable Lead Location: 753860
Implantable Lead Model: 148
Implantable Lead Serial Number: 116740
Implantable Pulse Generator Implant Date: 20180503
Lead Channel Impedance Value: 640 Ohm
Lead Channel Pacing Threshold Amplitude: 0.75 V
Lead Channel Pacing Threshold Pulse Width: 0.5 ms
Lead Channel Sensing Intrinsic Amplitude: 12 mV
Lead Channel Setting Pacing Amplitude: 2.5 V
Lead Channel Setting Pacing Pulse Width: 0.5 ms
Lead Channel Setting Sensing Sensitivity: 0.5 mV
Pulse Gen Serial Number: 7421148

## 2020-01-07 ENCOUNTER — Ambulatory Visit (INDEPENDENT_AMBULATORY_CARE_PROVIDER_SITE_OTHER): Payer: BC Managed Care – PPO

## 2020-01-07 DIAGNOSIS — I5022 Chronic systolic (congestive) heart failure: Secondary | ICD-10-CM

## 2020-01-07 DIAGNOSIS — Z95810 Presence of automatic (implantable) cardiac defibrillator: Secondary | ICD-10-CM

## 2020-01-07 NOTE — Progress Notes (Signed)
ICD Remote  

## 2020-01-10 NOTE — Progress Notes (Signed)
EPIC Encounter for ICM Monitoring  Patient Name: [name] is a 69 y.o. male Date: 01/10/2020 Primary Care Physican: Rory Percy, MD Primary Cardiologist:[facility]owell Electrophysiologist:Allred 4/23/2021Weight:221 lb baseline   Spoke with patient [name] is doing well. He denies any fluid symptoms.   CorvueThoracic impedancenormal.   Prescribed:  Spironolactone 25 mg take 1 tablet daily.  Recommendations:No changes and encouraged to call if experiencing any fluid symptoms.  Follow-up plan: ICM clinic phone appointment on5/24/2021. 91 day device clinic remote transmission 04/06/2020. Office visit with Dr Domenic Polite on 06/11/2020.   Copy of ICM check sent to Dr.Allred.  3 month ICM trend: 01/06/2020    1 Year ICM trend:       Rosalene Billings, RN 01/10/2020 1:25 PM

## 2020-01-11 ENCOUNTER — Other Ambulatory Visit: Payer: Self-pay | Admitting: Cardiology

## 2020-01-16 DIAGNOSIS — Z0001 Encounter for general adult medical examination with abnormal findings: Secondary | ICD-10-CM | POA: Diagnosis not present

## 2020-01-16 DIAGNOSIS — N400 Enlarged prostate without lower urinary tract symptoms: Secondary | ICD-10-CM | POA: Diagnosis not present

## 2020-01-21 DIAGNOSIS — Z0001 Encounter for general adult medical examination with abnormal findings: Secondary | ICD-10-CM | POA: Diagnosis not present

## 2020-01-31 ENCOUNTER — Encounter: Payer: Self-pay | Admitting: Gastroenterology

## 2020-02-06 ENCOUNTER — Other Ambulatory Visit: Payer: Self-pay

## 2020-02-06 ENCOUNTER — Ambulatory Visit (INDEPENDENT_AMBULATORY_CARE_PROVIDER_SITE_OTHER): Payer: BC Managed Care – PPO | Admitting: Pharmacist

## 2020-02-06 DIAGNOSIS — I480 Paroxysmal atrial fibrillation: Secondary | ICD-10-CM

## 2020-02-06 DIAGNOSIS — Z5181 Encounter for therapeutic drug level monitoring: Secondary | ICD-10-CM | POA: Diagnosis not present

## 2020-02-06 LAB — POCT INR: INR: 4.4 — AB (ref 2.0–3.0)

## 2020-02-06 NOTE — Patient Instructions (Signed)
Description   Skip your warfarin today and tomorrow, then start taking warfarin 1/2 tablet daily except 1 tablet on Sundays and Thursdays.  Recheck in 3 weeks

## 2020-02-10 ENCOUNTER — Ambulatory Visit (INDEPENDENT_AMBULATORY_CARE_PROVIDER_SITE_OTHER): Payer: BC Managed Care – PPO

## 2020-02-10 ENCOUNTER — Other Ambulatory Visit: Payer: Self-pay | Admitting: Cardiology

## 2020-02-10 DIAGNOSIS — I5022 Chronic systolic (congestive) heart failure: Secondary | ICD-10-CM | POA: Diagnosis not present

## 2020-02-10 DIAGNOSIS — Z95810 Presence of automatic (implantable) cardiac defibrillator: Secondary | ICD-10-CM | POA: Diagnosis not present

## 2020-02-10 NOTE — Progress Notes (Signed)
EPIC Encounter for ICM Monitoring  Patient Name: [name] is a 69 y.o. male Date: 02/10/2020 Primary Care Physican: Rory Percy, MD Primary Cardiologist:McDowell Electrophysiologist:Allred 5/24/2021Weight:221 lb baseline   Spoke with patient [name] is doing well.He denies any fluid symptoms.He reports he and his wife do tend to eat out more often than not.  Discussed limiting salt to 2000 mg daily and fluid intake to 64 oz daily.   CorvueThoracic impedancesuggesting possible fluid accumulation since 02/05/20.   Prescribed:  Spironolactone 25 mg take 1 tablet daily.  Recommendations:No changes and encouraged to call if experiencing any fluid symptoms.  Follow-up plan: ICM clinic phone appointment on6/09/2019 to recheck fluid levels. 91 day device clinic remote transmission 04/06/2020. Office visit with Dr Domenic Polite on 06/11/2020.   Copy of ICM check sent to Dr.Allred and Dr Domenic Polite.  3 month ICM trend: 02/10/2020    1 Year ICM trend:       Rosalene Billings, RN 02/10/2020 4:35 PM

## 2020-02-18 ENCOUNTER — Ambulatory Visit (INDEPENDENT_AMBULATORY_CARE_PROVIDER_SITE_OTHER): Payer: BC Managed Care – PPO

## 2020-02-18 DIAGNOSIS — I5022 Chronic systolic (congestive) heart failure: Secondary | ICD-10-CM

## 2020-02-18 DIAGNOSIS — Z95810 Presence of automatic (implantable) cardiac defibrillator: Secondary | ICD-10-CM

## 2020-02-19 NOTE — Progress Notes (Signed)
EPIC Encounter for ICM Monitoring  Patient Name: [name] is a 69 y.o. male Date: 02/19/2020 Primary Care Physican: Rory Percy, MD Primary Cardiologist:McDowell Electrophysiologist:Allred 5/24/2021Weight:221lb baseline 02/19/2020 Weight: 220 lb baseline   Spoke with patient. He reported losing fluid weight of about 4 lbs (highest weight was 224 lbs).  He is feeling fine at this time.  He has cut back on salt intake.  CorvueThoracic impedancereturned to normal.   Prescribed:  Spironolactone 25 mg take 1 tablet daily.  Recommendations: No changes and encouraged to call if experiencing any fluid symptoms.  Follow-up plan: ICM clinic phone appointment on6/28/2021. 91 day device clinic remote transmission7/19/2021. Office visit with Dr Domenic Polite on9/23/2021.   Copy of ICM check sent to Dr.Allred.  3 month ICM trend: 02/17/2020    1 Year ICM trend:       Rosalene Billings, RN 02/19/2020 2:19 PM

## 2020-02-26 ENCOUNTER — Other Ambulatory Visit: Payer: Self-pay

## 2020-02-26 ENCOUNTER — Encounter: Payer: Self-pay | Admitting: Gastroenterology

## 2020-02-26 ENCOUNTER — Ambulatory Visit: Payer: BC Managed Care – PPO | Admitting: Gastroenterology

## 2020-02-26 DIAGNOSIS — R194 Change in bowel habit: Secondary | ICD-10-CM | POA: Insufficient documentation

## 2020-02-26 NOTE — Progress Notes (Signed)
Primary Care Physician:  Rory Percy, MD  Referring Physician: Dr. Rory Percy  Primary Gastroenterologist:  Dr. Gala Romney  Chief Complaint  Patient presents with  . change in bowels    not loose stools but going frequently up until 2-3pm, usually when he eats in the morning it will go straight through him. He is fine at dinner/night.    HPI:   [name] is a 69 y.o. male presenting today at the request of Dr. Nadara Mustard due to a change in bowel habits.   Change in bowel habits since fall of last year. Gets up with a fullness and urgency to have a BM, then lasts till 230-3pm. Then doesn't feel bloated. If eats anything will pass right through him until afternoon. Sometimes stool watery but more often than not Bristol stool scale #4. Fullness and need to go quickly/urgently. Dr. Nadara Mustard recommended yogurt, which has helped. Eats at night before bed. Eats a Tums in the morning for reflux. Reflux off and on for a few years if eats something wrong or eats late at night. No hematochezia. Was taking Balance of Petra Kuba for a few months but stopped taking this. Some abdominal fullness but no pain. Occasionally some discomfort but not daily. No weight loss.   Due for colonoscopy next year. Last done in [facility] by Dr. Anthony Sar around 2012. Normal reportedly per patient. Excellent historian.  He is on Coumadin with history of afib. Stroke in remote past. He also has an ICD.       Past Medical History:  Diagnosis Date  . AICD (automatic cardioverter/defibrillator) present 2006   ICD  . Anterior myocardial infarction ([place])   . BCC (basal cell carcinoma) superficial 11/22/2016   Left back sholder  . Chronic systolic heart failure ([place])   . Coronary atherosclerosis of native coronary artery    a. s/p BMS to LAD and OM1 b. patent stents by cath in 2013  . History of hiatal hernia   . Hypertension   . 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 Ischemic cardiomyopathy    LVEF 30%, status post  ICD  . OSA (obstructive sleep apnea)    unable to tolerate CPAP  . Paroxysmal atrial fibrillation ([facility])    On coumadin  . Pseudoaneurysm ([place])   . SCC (squamous cell carcinoma) 08/04/2006   right shoulder (CX35FU)  . SCC (squamous cell carcinoma) well differentiated 12/13/2011   right temple  . 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 Stroke [facility])     Past Surgical History:  Procedure Laterality Date  . AICD implantation  2011   St Jude ICD implanted for primary prevention of sudden death  . CATARACT EXTRACTION W/PHACO Left 03/27/2017   Procedure: CATARACT EXTRACTION PHACO AND INTRAOCULAR LENS PLACEMENT (IOC);  Surgeon: [name], MD;  Location: [facility];  Service: Ophthalmology;  Laterality: Left;  CDE: 8.48  . CATARACT EXTRACTION W/PHACO Right 05/29/2017   Procedure: CATARACT EXTRACTION PHACO AND INTRAOCULAR LENS PLACEMENT (IOC);  Surgeon: [name], MD;  Location: [facility];  Service: Ophthalmology;  Laterality: Right;  CDE: 7.48  . EYE SURGERY    . ICD GENERATOR CHANGEOUT N/A 01/19/2017   SJM Ellipse VR ICD generator change by Dr [name]  . LAPAROSCOPY N/A 10/23/2017   Procedure: LAPAROSCOPY DIAGNOSTIC;  Surgeon: [name], MD;  Location: [place];  Service: General;  Laterality: N/A;  . LEFT HEART CATH AND CORONARY ANGIOGRAPHY N/A 06/21/2018   Procedure: LEFT HEART  CATH AND CORONARY ANGIOGRAPHY;  Surgeon: [name], MD;  Location: [facility] [facility];  Service: Cardiovascular;  Laterality: N/A;  . VENTRAL HERNIA REPAIR N/A 10/23/2017   Procedure: VENTRAL HERNIA REPAIR;  Surgeon: [name], MD;  Location: [place];  Service: General;  Laterality: N/A;    Current Outpatient Medications  Medication Sig Dispense Refill  . aspirin 81 MG tablet Take 81 mg by mouth daily.      . bisoprolol (ZEBETA) 5 MG tablet TAKE 1 TABLET BY MOUTH EVERY DAY 90 tablet 2  . finasteride (PROSCAR) 5 MG tablet Take 5 mg by mouth Daily.     . nitroGLYCERIN  (NITROSTAT) 0.4 MG SL tablet Place 1 tablet (0.4 mg total) under the tongue every 5 (five) minutes as needed for chest pain. 25 tablet 0  . rosuvastatin (CRESTOR) 20 MG tablet TAKE 1 TABLET (20 MG TOTAL) BY MOUTH DAILY. 90 tablet 1  . sacubitril-valsartan (ENTRESTO) 24-26 MG Take 1 tablet by mouth 2 (two) times daily. 180 tablet 2  . spironolactone (ALDACTONE) 25 MG tablet TAKE 1 TABLET BY MOUTH EVERY DAY 90 tablet 1  . warfarin (COUMADIN) 5 MG tablet TAKE 1/2 TABLET DAILY EXCEPT 1 TABLET ON SUNDAYS AND THURSDAYS 54 tablet 1   No current facility-administered medications for this visit.    Allergies as of 02/26/2020  . (No Known Allergies)    Family History  Problem Relation Age of Onset  . Cancer Mother        lymphoma   . Heart attack Father   . Heart disease Brother   . Colon cancer Neg Hx   . Colon polyps Neg Hx     Social History   Socioeconomic History  . Marital status: Married    Spouse name: Not on file  . Number of children: Not on file  . Years of education: Not on file  . Highest education level: Not on file  Occupational History  . Occupation: retired    Comment: Science writer  Tobacco Use  . Smoking status: Never Smoker  . Smokeless tobacco: Never Used  Vaping Use  . Vaping Use: Never used  Substance and Sexual Activity  . Alcohol use: Yes    Alcohol/week: 0.0 standard drinks    Comment: occasional  . Drug use: No  . Sexual activity: Not on file  Other Topics Concern  . Not on file  Social History Narrative  . Not on file   Social Determinants of Health   Financial Resource Strain:   . Difficulty of Paying Living Expenses:   Food Insecurity:   . Worried About Charity fundraiser in the Last Year:   . Arboriculturist in the Last Year:   Transportation Needs:   . Film/video editor (Medical):   [name] Lack of Transportation (Non-Medical):   Physical Activity:   . Days of Exercise per Week:   . Minutes of Exercise per Session:   Stress:   . Feeling  of Stress :   Social Connections:   . Frequency of Communication with Friends and Family:   . Frequency of Social Gatherings with Friends and Family:   . Attends Religious Services:   . Active Member of Clubs or Organizations:   . Attends Archivist Meetings:   [name] Marital Status:   Intimate Partner Violence:   . Fear of Current or Ex-Partner:   . Emotionally Abused:   [name] Physically Abused:   . Sexually Abused:  Review of Systems: Gen: Denies any fever, chills, fatigue, weight loss, lack of appetite.  CV: Denies chest pain, heart palpitations, peripheral edema, syncope.  Resp: Denies shortness of breath at rest or with exertion. Denies wheezing or cough.  GI: see HPI GU : Denies urinary burning, urinary frequency, urinary hesitancy MS: Denies joint pain, muscle weakness, cramps, or limitation of movement.  Derm: Denies rash, itching, dry skin Psych: Denies depression, anxiety, memory loss, and confusion Heme: Denies bruising, bleeding, and enlarged lymph nodes.  Physical Exam: BP 101/66   Pulse 84   Temp (!) 97.1 F (36.2 C) (Oral)   Ht 5\' 11"  (1.803 m)   Wt 226 lb 9.6 oz (102.8 kg)   BMI 31.60 kg/m  General:   Alert and oriented. Pleasant and cooperative. Well-nourished and well-developed.  Head:  Normocephalic and atraumatic. Eyes:  Without icterus, sclera clear and conjunctiva pink.  Ears:  Normal auditory acuity. Mouth:  Mask in place Lungs:  Clear to auscultation bilaterally.  Heart:  S1, S2 present without murmurs appreciated.  Abdomen:  +BS, soft, non-tender and non-distended. No HSM noted. No guarding or rebound. No masses appreciated.  Rectal:  Deferred  Msk:  Symmetrical without gross deformities. Normal posture. Extremities:  Without edema. Neurologic:  Alert and  oriented x4;  grossly normal neurologically. Skin:  Intact without significant lesions or rashes. Psych:  Alert and cooperative. Normal mood and affect.  ASSESSMENT: TAEVYN HAUSEN  is a 69 y.o. male presenting today at request of Dr. Rory Percy for colonoscopy due to changes in bowel habits, with last colonoscopy around 2012 by Dr. Anthony Sar per patient's report.   Bowel changes: with urgency, postprandial component, but predominantly formed stool overall. No significant persistent diarrhea, and yogurt has helped with overall symptoms. No overt GI bleeding. He has no other alarm signs/symptoms including abdominal pain, weight loss, lack of appetite. No family history of colorectal cancer or polyps. We will add supplemental fiber daily to see if this helps "even" out bowel habits. As he is not having outright looser stool or cramping, will hold off on Bentyl but could consider this in the future. He desires a colonoscopy, which is certainly not unreasonable at this time as last was almost 10 years ago.  As he is on Coumadin for history of afib, remote history of stroke, will need Lovenox bridge. Megan Supple, RPH-CPP who has recently seen patient for INR checks, will provide Lovenox management. We will need to update her regarding date once colonoscopy is set. As of note, he also has an implanted defibrillator.    PLAN:  Proceed with TCS with Dr. Gala Romney in near future: the risks, benefits, and alternatives have been discussed with the patient in detail. The patient states understanding and desires to proceed.  Hold Coumadin 3 days prior. Lovenox bridge per cardiology (pharmacist), who has graciously offered to assist with this.   Add supplemental fiber daily and call if no improvement  Probiotic daily  Annitta Needs, PhD, [facility]

## 2020-02-26 NOTE — Patient Instructions (Signed)
We are arranging a colonoscopy in the near future.  Please stop coumadin 3 days before the procedure. I will get with Edrick Oh, RN, regarding possible need for Lovenox shots.   For now, let's try Benefiber 2 teaspoons each morning, increasing as needed. You can also take a probiotic over the counter such as Digestive Advantage, [place], [place], [employer].  Please send me a message if no improvement with the fiber and probiotic. We are just trying to see if this helps even out your bowel movements. If not, we will do a prescriptive agent.  Further recommendations to follow!  It was a pleasure to see you today. I want to create trusting relationships with patients to provide genuine, compassionate, and quality care. I value your feedback. If you receive a survey regarding your visit,  I greatly appreciate you taking time to fill this out.   Annitta Needs, PhD, ANP-BC [facility] Gastroenterology

## 2020-02-27 ENCOUNTER — Ambulatory Visit (INDEPENDENT_AMBULATORY_CARE_PROVIDER_SITE_OTHER): Payer: BC Managed Care – PPO | Admitting: Pharmacist

## 2020-02-27 DIAGNOSIS — I480 Paroxysmal atrial fibrillation: Secondary | ICD-10-CM | POA: Diagnosis not present

## 2020-02-27 DIAGNOSIS — Z5181 Encounter for therapeutic drug level monitoring: Secondary | ICD-10-CM | POA: Diagnosis not present

## 2020-02-27 LAB — POCT INR: INR: 2.3 (ref 2.0–3.0)

## 2020-02-27 NOTE — Patient Instructions (Signed)
Description   Continue taking warfarin 1/2 tablet daily except 1 tablet on Sundays and Thursdays.  Pending colonoscopy - will need Lovenox bridge. Consider changing to Eliquis. Recheck in 4 weeks

## 2020-03-02 ENCOUNTER — Telehealth: Payer: Self-pay | Admitting: Gastroenterology

## 2020-03-02 NOTE — Progress Notes (Signed)
Cc'ed to pcp °

## 2020-03-02 NOTE — Telephone Encounter (Signed)
[facility] clinical pool:   Megan Supple, RPH-CPP, has offered to help with Lovenox bridge. Please let us know when patient is scheduled, so that we can hold Coumadin and provide instructions appropriately. Thanks!

## 2020-03-02 NOTE — Telephone Encounter (Signed)
Noted. Note placed with encounter form for scheduling.

## 2020-03-16 ENCOUNTER — Ambulatory Visit (INDEPENDENT_AMBULATORY_CARE_PROVIDER_SITE_OTHER): Payer: BC Managed Care – PPO

## 2020-03-16 DIAGNOSIS — I5022 Chronic systolic (congestive) heart failure: Secondary | ICD-10-CM | POA: Diagnosis not present

## 2020-03-16 DIAGNOSIS — Z95810 Presence of automatic (implantable) cardiac defibrillator: Secondary | ICD-10-CM | POA: Diagnosis not present

## 2020-03-18 NOTE — Progress Notes (Signed)
EPIC Encounter for ICM Monitoring  Patient Name: [name] is a 69 y.o. male Date: 03/18/2020 Primary Care Physican: Rory Percy, MD Primary Cardiologist:McDowell Electrophysiologist:Allred 03/18/2020 Weight: 220 lb baseline   Spoke with patient and reports feeling well at this time.  Denies fluid symptoms.   [place] the beach during [place] impedance.   CorvueThoracic impedancenormal but was suggestive of possible fluid accumulation from6/15-6/23.  Prescribed:  Spironolactone 25 mg take 1 tablet daily.  Recommendations:No changes and encouraged to call if experiencing any fluid symptoms.  Follow-up plan: ICM clinic phone appointment on8/10/2019. 91 day device clinic remote transmission7/19/2021.   EP/Cardiology Office visits: Dr Domenic Polite on9/23/2021 and Dr [name] 06/19/2020.   Copy of ICM check sent to Dr.Allred.  3 month ICM trend: 03/16/2020    1 Year ICM trend:       Rosalene Billings, RN 03/18/2020 9:21 AM

## 2020-03-20 ENCOUNTER — Telehealth: Payer: Self-pay | Admitting: Internal Medicine

## 2020-03-20 ENCOUNTER — Telehealth: Payer: Self-pay | Admitting: *Deleted

## 2020-03-20 MED ORDER — CLENPIQ 10-3.5-12 MG-GM -GM/160ML PO SOLN: 1.0000 | Freq: Once | ORAL | 0 refills | Status: AC

## 2020-03-20 NOTE — Telephone Encounter (Signed)
Please see phone note from 6/14. Megan Supple, RPH-CPP has offered to help with Lovenox bridge. I have copied her on this for instructions.

## 2020-03-20 NOTE — Telephone Encounter (Signed)
Called patient and he is scheduled TCS with RMR for 04/21/2020 at 8:30am. Covid test scheduled for 8/2 at 8:00am. Patient aware will mail instructions to him. Confirmed mailing address. Please advise coumadin/lovenox instructions AB thanks

## 2020-03-20 NOTE — Telephone Encounter (Signed)
NO PA NEEDED FOR TCS [id number]

## 2020-03-20 NOTE — Telephone Encounter (Signed)
noted 

## 2020-03-20 NOTE — Telephone Encounter (Signed)
[place]   PHONE [phone]   NO PAC NEEDED FOR TCS [phone]

## 2020-03-22 ENCOUNTER — Other Ambulatory Visit: Payer: Self-pay | Admitting: Cardiology

## 2020-03-26 ENCOUNTER — Ambulatory Visit (INDEPENDENT_AMBULATORY_CARE_PROVIDER_SITE_OTHER): Payer: BC Managed Care – PPO | Admitting: *Deleted

## 2020-03-26 ENCOUNTER — Other Ambulatory Visit: Payer: Self-pay

## 2020-03-26 DIAGNOSIS — I480 Paroxysmal atrial fibrillation: Secondary | ICD-10-CM | POA: Diagnosis not present

## 2020-03-26 DIAGNOSIS — I4891 Unspecified atrial fibrillation: Secondary | ICD-10-CM | POA: Diagnosis not present

## 2020-03-26 DIAGNOSIS — Z5181 Encounter for therapeutic drug level monitoring: Secondary | ICD-10-CM | POA: Diagnosis not present

## 2020-03-26 LAB — POCT INR
INR: 1.8 — AB (ref 2.0–3.0)
INR: 1.8 — AB (ref 2.0–3.0)

## 2020-03-26 NOTE — Patient Instructions (Signed)
Take warfarin 1 1/2 tablets tonight then increase dose to 1/2 tablet daily except 1 tablet on Sundays, Tuesdays and Thursdays.  Pending colonoscopy (04/21/20) - will need Lovenox bridge. Recheck in 4 weeks

## 2020-03-31 NOTE — Telephone Encounter (Signed)
AB please advise as I have not received any instructions for patient. thanks

## 2020-04-01 NOTE — Telephone Encounter (Signed)
Megan Supple, RPH-CPP will be adjusting this the week prior when he comes for INR check.

## 2020-04-03 NOTE — Telephone Encounter (Signed)
Noted. Instructions for prep/covid mailed to patient.

## 2020-04-06 ENCOUNTER — Ambulatory Visit (INDEPENDENT_AMBULATORY_CARE_PROVIDER_SITE_OTHER): Payer: BC Managed Care – PPO | Admitting: *Deleted

## 2020-04-06 DIAGNOSIS — I255 Ischemic cardiomyopathy: Secondary | ICD-10-CM | POA: Diagnosis not present

## 2020-04-07 LAB — CUP PACEART REMOTE DEVICE CHECK
Battery Remaining Longevity: 76 mo
Battery Remaining Percentage: 74 %
Battery Voltage: 2.98 V
Brady Statistic RV Percent Paced: 1 %
Date Time Interrogation Session: 20210719040023
HighPow Impedance: 47 Ohm
HighPow Impedance: 47 Ohm
Implantable Lead Implant Date: 20020111
Implantable Lead Location: 753860
Implantable Lead Model: 148
Implantable Lead Serial Number: 116740
Implantable Pulse Generator Implant Date: 20180503
Lead Channel Impedance Value: 640 Ohm
Lead Channel Pacing Threshold Amplitude: 0.75 V
Lead Channel Pacing Threshold Pulse Width: 0.5 ms
Lead Channel Sensing Intrinsic Amplitude: 12 mV
Lead Channel Setting Pacing Amplitude: 2.5 V
Lead Channel Setting Pacing Pulse Width: 0.5 ms
Lead Channel Setting Sensing Sensitivity: 0.5 mV
Pulse Gen Serial Number: 7421148

## 2020-04-08 NOTE — Progress Notes (Signed)
Remote ICD transmission.   

## 2020-04-13 ENCOUNTER — Other Ambulatory Visit: Payer: Self-pay

## 2020-04-13 ENCOUNTER — Ambulatory Visit (INDEPENDENT_AMBULATORY_CARE_PROVIDER_SITE_OTHER): Payer: BC Managed Care – PPO | Admitting: *Deleted

## 2020-04-13 DIAGNOSIS — I4891 Unspecified atrial fibrillation: Secondary | ICD-10-CM

## 2020-04-13 DIAGNOSIS — I480 Paroxysmal atrial fibrillation: Secondary | ICD-10-CM

## 2020-04-13 DIAGNOSIS — Z5181 Encounter for therapeutic drug level monitoring: Secondary | ICD-10-CM

## 2020-04-13 LAB — POCT INR: INR: 2.6 (ref 2.0–3.0)

## 2020-04-13 MED ORDER — ENOXAPARIN SODIUM 150 MG/ML ~~LOC~~ SOLN: 150.0000 mg | SUBCUTANEOUS | 0 refills | Status: DC

## 2020-04-13 NOTE — Patient Instructions (Signed)
Pending colonoscopy on 04/21/20 Wt. 102.8kg   Lab 12/02/19  SCr 1.26  CrCl 79.32  7/28  Take last dose of warfarin 7/29  No lovenox or warfarin 7/30-8/2  Lovenox 150mg  SQ at 8am daily 8/3  No Lovenox in am---------procedure---------warfarin 5mg  pm 8/4-8/8  Lovenox 150mg  sq 8am and warfarin 5mg  pm 8/9  Lovenox 150mg  sq am--------INR appt at 10:00am

## 2020-04-14 ENCOUNTER — Telehealth: Payer: Self-pay | Admitting: Internal Medicine

## 2020-04-14 DIAGNOSIS — R944 Abnormal results of kidney function studies: Secondary | ICD-10-CM | POA: Diagnosis not present

## 2020-04-14 NOTE — Telephone Encounter (Signed)
Pt is scheduled with RMR on 8/3 and he has questions about a call he received from pre service center, his insurance and how things will be coded and said his insurance is to pay 100%. Please call [phone]

## 2020-04-14 NOTE — Telephone Encounter (Signed)
Called spoke with pt. Aware since AB saw him for change in bowel habits, TCS is considered as diagnostic since he is having a problem. He voiced understanding. Also confirmed he has been given instructions for his coumadin/lovenox by Edrick Oh at the coumadin clinic.

## 2020-04-20 ENCOUNTER — Other Ambulatory Visit (HOSPITAL_COMMUNITY)
Admission: RE | Admit: 2020-04-20 | Discharge: 2020-04-20 | Disposition: A | Discharge status: Home / Self Care | Payer: BC Managed Care – PPO | Source: Ambulatory Visit | Attending: Internal Medicine | Admitting: Internal Medicine

## 2020-04-20 ENCOUNTER — Ambulatory Visit (INDEPENDENT_AMBULATORY_CARE_PROVIDER_SITE_OTHER): Payer: BC Managed Care – PPO

## 2020-04-20 ENCOUNTER — Other Ambulatory Visit: Payer: Self-pay

## 2020-04-20 DIAGNOSIS — Z20822 Contact with and (suspected) exposure to covid-19: Secondary | ICD-10-CM | POA: Insufficient documentation

## 2020-04-20 DIAGNOSIS — Z01812 Encounter for preprocedural laboratory examination: Secondary | ICD-10-CM | POA: Diagnosis not present

## 2020-04-20 DIAGNOSIS — Z95810 Presence of automatic (implantable) cardiac defibrillator: Secondary | ICD-10-CM

## 2020-04-20 DIAGNOSIS — I5022 Chronic systolic (congestive) heart failure: Secondary | ICD-10-CM

## 2020-04-20 LAB — SARS CORONAVIRUS 2 (TAT 6-24 HRS): SARS Coronavirus 2: NEGATIVE

## 2020-04-21 ENCOUNTER — Other Ambulatory Visit: Payer: Self-pay

## 2020-04-21 ENCOUNTER — Encounter (HOSPITAL_COMMUNITY): Payer: Self-pay | Admitting: Internal Medicine

## 2020-04-21 ENCOUNTER — Ambulatory Visit (HOSPITAL_COMMUNITY)
Admission: RE | Admit: 2020-04-21 | Discharge: 2020-04-21 | Disposition: A | Discharge status: Home / Self Care | Payer: BC Managed Care – PPO | Attending: Internal Medicine | Admitting: Internal Medicine

## 2020-04-21 ENCOUNTER — Encounter (HOSPITAL_COMMUNITY)
Admission: RE | Disposition: A | Discharge status: Home / Self Care | Payer: Self-pay | Source: Home / Self Care | Attending: Internal Medicine

## 2020-04-21 DIAGNOSIS — I480 Paroxysmal atrial fibrillation: Secondary | ICD-10-CM | POA: Insufficient documentation

## 2020-04-21 DIAGNOSIS — D120 Benign neoplasm of cecum: Secondary | ICD-10-CM | POA: Insufficient documentation

## 2020-04-21 DIAGNOSIS — Z85828 Personal history of other malignant neoplasm of skin: Secondary | ICD-10-CM | POA: Insufficient documentation

## 2020-04-21 DIAGNOSIS — I2510 Atherosclerotic heart disease of native coronary artery without angina pectoris: Secondary | ICD-10-CM | POA: Diagnosis not present

## 2020-04-21 DIAGNOSIS — Z955 Presence of coronary angioplasty implant and graft: Secondary | ICD-10-CM | POA: Diagnosis not present

## 2020-04-21 DIAGNOSIS — I255 Ischemic cardiomyopathy: Secondary | ICD-10-CM | POA: Diagnosis not present

## 2020-04-21 DIAGNOSIS — D123 Benign neoplasm of transverse colon: Secondary | ICD-10-CM | POA: Insufficient documentation

## 2020-04-21 DIAGNOSIS — G4733 Obstructive sleep apnea (adult) (pediatric): Secondary | ICD-10-CM | POA: Insufficient documentation

## 2020-04-21 DIAGNOSIS — R197 Diarrhea, unspecified: Secondary | ICD-10-CM | POA: Diagnosis not present

## 2020-04-21 DIAGNOSIS — Z79899 Other long term (current) drug therapy: Secondary | ICD-10-CM | POA: Insufficient documentation

## 2020-04-21 DIAGNOSIS — Z7901 Long term (current) use of anticoagulants: Secondary | ICD-10-CM | POA: Insufficient documentation

## 2020-04-21 DIAGNOSIS — Z7982 Long term (current) use of aspirin: Secondary | ICD-10-CM | POA: Insufficient documentation

## 2020-04-21 DIAGNOSIS — I110 Hypertensive heart disease with heart failure: Secondary | ICD-10-CM | POA: Diagnosis not present

## 2020-04-21 DIAGNOSIS — D124 Benign neoplasm of descending colon: Secondary | ICD-10-CM | POA: Insufficient documentation

## 2020-04-21 DIAGNOSIS — K635 Polyp of colon: Secondary | ICD-10-CM

## 2020-04-21 DIAGNOSIS — Z95810 Presence of automatic (implantable) cardiac defibrillator: Secondary | ICD-10-CM | POA: Insufficient documentation

## 2020-04-21 DIAGNOSIS — I5022 Chronic systolic (congestive) heart failure: Secondary | ICD-10-CM | POA: Diagnosis not present

## 2020-04-21 DIAGNOSIS — Z8673 Personal history of transient ischemic attack (TIA), and cerebral infarction without residual deficits: Secondary | ICD-10-CM | POA: Diagnosis not present

## 2020-04-21 DIAGNOSIS — K5730 Diverticulosis of large intestine without perforation or abscess without bleeding: Secondary | ICD-10-CM | POA: Diagnosis not present

## 2020-04-21 DIAGNOSIS — I252 Old myocardial infarction: Secondary | ICD-10-CM | POA: Insufficient documentation

## 2020-04-21 HISTORY — PX: BIOPSY: SHX5522

## 2020-04-21 HISTORY — PX: COLONOSCOPY: SHX5424

## 2020-04-21 HISTORY — PX: POLYPECTOMY: SHX5525

## 2020-04-21 SURGERY — COLONOSCOPY
Anesthesia: Moderate Sedation

## 2020-04-21 MED ORDER — STERILE WATER FOR IRRIGATION IR SOLN
Status: DC | PRN
  Administered 2020-04-21: 1.5 mL

## 2020-04-21 MED ORDER — MIDAZOLAM HCL 5 MG/5ML IJ SOLN
INTRAMUSCULAR | Status: DC | PRN
  Administered 2020-04-21 (×3): 1 mg via INTRAVENOUS

## 2020-04-21 MED ORDER — MIDAZOLAM HCL 5 MG/5ML IJ SOLN
INTRAMUSCULAR | Status: AC
  Filled 2020-04-21: qty 10

## 2020-04-21 MED ORDER — MEPERIDINE HCL 100 MG/ML IJ SOLN
INTRAMUSCULAR | Status: DC | PRN
  Administered 2020-04-21: 25 mg via INTRAVENOUS
  Administered 2020-04-21: 15 mg via INTRAVENOUS

## 2020-04-21 MED ORDER — ONDANSETRON HCL 4 MG/2ML IJ SOLN
INTRAMUSCULAR | Status: AC
  Filled 2020-04-21: qty 2

## 2020-04-21 MED ORDER — SODIUM CHLORIDE 0.9 % IV SOLN: INTRAVENOUS | Status: DC

## 2020-04-21 MED ORDER — MEPERIDINE HCL 50 MG/ML IJ SOLN
INTRAMUSCULAR | Status: AC
  Filled 2020-04-21: qty 1

## 2020-04-21 MED ORDER — ONDANSETRON HCL 4 MG/2ML IJ SOLN
INTRAMUSCULAR | Status: DC | PRN
  Administered 2020-04-21: 4 mg via INTRAVENOUS

## 2020-04-21 NOTE — Progress Notes (Signed)
EPIC Encounter for ICM Monitoring  Patient Name: [name] is a 69 y.o. male Date: 04/21/2020 Primary Care Physican: Rory Percy, MD Primary Cardiologist:McDowell Electrophysiologist:Allred 03/18/2020 Weight: 220 lb baseline   Spoke with patient.  Patient had endo/colonoscopy today 8/3 which may have contributed to decreased impedance for the past few days.  CorvueThoracic impedancesuggesting possible fluid accumulation since 04/16/2020.  Prescribed:  Spironolactone 25 mg take 1 tablet daily.  Recommendations:No changes and encouraged to call if experiencing any fluid symptoms.  Follow-up plan: ICM clinic phone appointment on8/07/2020 to recheck fluid levels. 91 day device clinic remote transmission10/18/2021.   EP/Cardiology Office visits: Dr Domenic Polite on9/23/2021 and Dr [name] 06/19/2020.   Copy of ICM check sent to Dr.Allred and Dr Domenic Polite.  3 month ICM trend: 04/20/2020    1 Year ICM trend:       Rosalene Billings, RN 04/21/2020 10:14 AM

## 2020-04-21 NOTE — Discharge Instructions (Addendum)
Diverticulosis  Diverticulosis is a condition that develops when small pouches (diverticula) form in the wall of the large intestine (colon). The colon is where water is absorbed and stool (feces) is formed. The pouches form when the inside layer of the colon pushes through weak spots in the outer layers of the colon. You may have a few pouches or many of them. The pouches usually do not cause problems unless they become inflamed or infected. When this happens, the condition is called diverticulitis. What are the causes? The cause of this condition is not known. What increases the risk? The following factors may make you more likely to develop this condition: Being older than age 45. Your risk for this condition increases with age. Diverticulosis is rare among people younger than age 42. By age 45, many people have it. Eating a low-fiber diet. Having frequent constipation. Being overweight. Not getting enough exercise. Smoking. Taking over-the-counter pain medicines, like aspirin and ibuprofen. Having a family history of diverticulosis. What are the signs or symptoms? In most people, there are no symptoms of this condition. If you do have symptoms, they may include: Bloating. Cramps in the abdomen. Constipation or diarrhea. Pain in the lower left side of the abdomen. How is this diagnosed? Because diverticulosis usually has no symptoms, it is most often diagnosed during an exam for other colon problems. The condition may be diagnosed by: Using a flexible scope to examine the colon (colonoscopy). Taking an X-ray of the colon after dye has been put into the colon (barium enema). Having a CT scan. How is this treated? You may not need treatment for this condition. Your health care provider may recommend treatment to prevent problems. You may need treatment if you have symptoms or if you previously had diverticulitis. Treatment may include: Eating a high-fiber diet. Taking a fiber  supplement. Taking a live bacteria supplement (probiotic). Taking medicine to relax your colon. Follow these instructions at home: Medicines Take over-the-counter and prescription medicines only as told by your health care provider. If told by your health care provider, take a fiber supplement or probiotic. Constipation prevention Your condition may cause constipation. To prevent or treat constipation, you may need to: Drink enough fluid to keep your urine pale yellow. Take over-the-counter or prescription medicines. Eat foods that are high in fiber, such as beans, whole grains, and fresh fruits and vegetables. Limit foods that are high in fat and processed sugars, such as fried or sweet foods.  General instructions Try not to strain when you have a bowel movement. Keep all follow-up visits as told by your health care provider. This is important. Contact a health care provider if you: Have pain in your abdomen. Have bloating. Have cramps. Have not had a bowel movement in 3 days. Get help right away if: Your pain gets worse. Your bloating becomes very bad. You have a fever or chills, and your symptoms suddenly get worse. You vomit. You have bowel movements that are bloody or black. You have bleeding from your rectum. Summary Diverticulosis is a condition that develops when small pouches (diverticula) form in the wall of the large intestine (colon). You may have a few pouches or many of them. This condition is most often diagnosed during an exam for other colon problems. Treatment may include increasing the fiber in your diet, taking supplements, or taking medicines. This information is not intended to replace advice given to you by your health care provider. Make sure you discuss any questions you have with  your health care provider. Document Revised: 04/04/2019 Document Reviewed: 04/04/2019 Elsevier Patient Education  [name]. Colon Polyps  Polyps are tissue growths  inside the body. Polyps can grow in many places, including the large intestine (colon). A polyp may be a round bump or a mushroom-shaped growth. You could have one polyp or several. Most colon polyps are noncancerous (benign). However, some colon polyps can become cancerous over time. Finding and removing the polyps early can help prevent this. What are the causes? The exact cause of colon polyps is not known. What increases the risk? You are more likely to develop this condition if you:  Have a family history of colon cancer or colon polyps.  Are older than 81 or older than 45 if you are African American.  Have inflammatory bowel disease, such as ulcerative colitis or Crohn's disease.  Have certain hereditary conditions, such as: ? Familial adenomatous polyposis. ? Lynch syndrome. ? Turcot syndrome. ? Peutz-Jeghers syndrome.  Are overweight.  Smoke cigarettes.  Do not get enough exercise.  Drink too much alcohol.  Eat a diet that is high in fat and red meat and low in fiber.  Had childhood cancer that was treated with abdominal radiation. What are the signs or symptoms? Most polyps do not cause symptoms. If you have symptoms, they may include:  Blood coming from your rectum when having a bowel movement.  Blood in your stool. The stool may look dark red or black.  Abdominal pain.  A change in bowel habits, such as constipation or diarrhea. How is this diagnosed? This condition is diagnosed with a colonoscopy. This is a procedure in which a lighted, flexible scope is inserted into the anus and then passed into the colon to examine the area. Polyps are sometimes found when a colonoscopy is done as part of routine cancer screening tests. How is this treated? Treatment for this condition involves removing any polyps that are found. Most polyps can be removed during a colonoscopy. Those polyps will then be tested for cancer. Additional treatment may be needed depending on the  results of testing. Follow these instructions at home: Lifestyle  Maintain a healthy weight, or lose weight if recommended by your health care provider.  Exercise every day or as told by your health care provider.  Do not use any products that contain nicotine or tobacco, such as cigarettes and e-cigarettes. If you need help quitting, ask your health care provider.  If you drink alcohol, limit how much you have: ? 0-1 drink a day for women. ? 0-2 drinks a day for men.  Be aware of how much alcohol is in your drink. In the [place] one drink equals one 12 oz bottle of beer (355 mL), one 5 oz glass of wine (148 mL), or one 1 oz shot of hard liquor (44 mL). Eating and drinking   Eat foods that are high in fiber, such as fruits, vegetables, and whole grains.  Eat foods that are high in calcium and vitamin D, such as milk, cheese, yogurt, eggs, liver, fish, and broccoli.  Limit foods that are high in fat, such as fried foods and desserts.  Limit the amount of red meat and processed meat you eat, such as hot dogs, sausage, bacon, and lunch meats. General instructions  Keep all follow-up visits as told by your health care provider. This is important. ? This includes having regularly scheduled colonoscopies. ? Talk to your health care provider about when you need a colonoscopy. Contact  a health care provider if:  You have new or worsening bleeding during a bowel movement.  You have new or increased blood in your stool.  You have a change in bowel habits.  You lose weight for no known reason. Summary  Polyps are tissue growths inside the body. Polyps can grow in many places, including the colon.  Most colon polyps are noncancerous (benign), but some can become cancerous over time.  This condition is diagnosed with a colonoscopy.  Treatment for this condition involves removing any polyps that are found. Most polyps can be removed during a colonoscopy. This information is not  intended to replace advice given to you by your health care provider. Make sure you discuss any questions you have with your health care provider. Document Revised: 12/21/2017 Document Reviewed: 12/21/2017 Elsevier Patient Education  [place].  Colonoscopy Discharge Instructions  Read the instructions outlined below and refer to this sheet in the next few weeks. These discharge instructions provide you with general information on caring for yourself after you leave the hospital. Your doctor may also give you specific instructions. While your treatment has been planned according to the most current medical practices available, unavoidable complications occasionally occur. If you have any problems or questions after discharge, call Dr. Gala Romney at [phone]. ACTIVITY  You may resume your regular activity, but move at a slower pace for the next 24 hours.   Take frequent rest periods for the next 24 hours.   Walking will help get rid of the air and reduce the bloated feeling in your belly (abdomen).   No driving for 24 hours (because of the medicine (anesthesia) used during the test).    Do not sign any important legal documents or operate any machinery for 24 hours (because of the anesthesia used during the test).  NUTRITION  Drink plenty of fluids.   You may resume your normal diet as instructed by your doctor.   Begin with a light meal and progress to your normal diet. Heavy or fried foods are harder to digest and may make you feel sick to your stomach (nauseated).   Avoid alcoholic beverages for 24 hours or as instructed.  MEDICATIONS  You may resume your normal medications unless your doctor tells you otherwise.  WHAT YOU CAN EXPECT TODAY  Some feelings of bloating in the abdomen.   Passage of more gas than usual.   Spotting of blood in your stool or on the toilet paper.  IF YOU HAD POLYPS REMOVED DURING THE COLONOSCOPY:  No aspirin products for 7 days or as instructed.    No alcohol for 7 days or as instructed.   Eat a soft diet for the next 24 hours.  FINDING OUT THE RESULTS OF YOUR TEST Not all test results are available during your visit. If your test results are not back during the visit, make an appointment with your caregiver to find out the results. Do not assume everything is normal if you have not heard from your caregiver or the medical facility. It is important for you to follow up on all of your test results.  SEEK IMMEDIATE MEDICAL ATTENTION IF:  You have more than a spotting of blood in your stool.   Your belly is swollen (abdominal distention).   You are nauseated or vomiting.   You have a temperature over 101.   You have abdominal pain or discomfort that is severe or gets worse throughout the day.   3 polyps removed from your  colon.  Biopsies also taken.  Diverticulosis and polyp information provided  Further recommendations to follow pending review of pathology report  Continue daily fiber (Benefiber) and probiotic.  Resume Coumadin today  Resume Lovenox today.  Continue Lovenox until INR is therapeutic as directed by the cardiology clinic  Office visit with [place] in 2 months.  At patient request, I called [name] at [phone] -reviewed results

## 2020-04-21 NOTE — H&P (Signed)
@LOGO @   Primary Care Physician:  Rory Percy, MD Primary Gastroenterologist:  Dr. Gala Romney  Pre-Procedure History & Physical: HPI:  [name] is a 69 y.o. male here for colonoscopy.  Change in bowel habits recently.  Postprandial diarrhea in the mornings.  No bleeding.  Started on a oral probiotic and Benefiber with marked improvement in symptoms.  No colonoscopy in 10 years.  Coumadin held per plan.  Bridging with Lovenox.  No Lovenox today.  Past Medical History:  Diagnosis Date  . AICD (automatic cardioverter/defibrillator) present 2006   ICD  . Anterior myocardial infarction ([place])   . BCC (basal cell carcinoma) superficial 11/22/2016   Left back sholder  . Chronic systolic heart failure ([place])   . Coronary atherosclerosis of native coronary artery    a. s/p BMS to LAD and OM1 b. patent stents by cath in 2013  . History of hiatal hernia   . Hypertension   . 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 Ischemic cardiomyopathy    LVEF 30%, status post ICD  . OSA (obstructive sleep apnea)    unable to tolerate CPAP  . Paroxysmal atrial fibrillation ([facility])    On coumadin  . Pseudoaneurysm ([place])   . SCC (squamous cell carcinoma) 08/04/2006   right shoulder (CX35FU)  . SCC (squamous cell carcinoma) well differentiated 12/13/2011   right temple  . 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 Stroke [facility])     Past Surgical History:  Procedure Laterality Date  . AICD implantation  2011   St Jude ICD implanted for primary prevention of sudden death  . CATARACT EXTRACTION W/PHACO Left 03/27/2017   Procedure: CATARACT EXTRACTION PHACO AND INTRAOCULAR LENS PLACEMENT (IOC);  Surgeon: [name], MD;  Location: [facility];  Service: Ophthalmology;  Laterality: Left;  CDE: 8.48  . CATARACT EXTRACTION W/PHACO Right 05/29/2017   Procedure: CATARACT EXTRACTION PHACO AND INTRAOCULAR LENS PLACEMENT (IOC);  Surgeon: [name], MD;  Location: [facility];  Service:  Ophthalmology;  Laterality: Right;  CDE: 7.48  . EYE SURGERY    . ICD GENERATOR CHANGEOUT N/A 01/19/2017   [facility] [place] VR ICD generator change by Dr [name]  . LAPAROSCOPY N/A 10/23/2017   Procedure: LAPAROSCOPY DIAGNOSTIC;  Surgeon: [name], MD;  Location: [facility];  Service: General;  Laterality: N/A;  . LEFT HEART CATH AND CORONARY ANGIOGRAPHY N/A 06/21/2018   Procedure: LEFT HEART CATH AND CORONARY ANGIOGRAPHY;  Surgeon: [name], MD;  Location: [facility];  Service: Cardiovascular;  Laterality: N/A;  . VENTRAL HERNIA REPAIR N/A 10/23/2017   Procedure: VENTRAL HERNIA REPAIR;  Surgeon: [name], MD;  Location: [facility];  Service: General;  Laterality: N/A;    Prior to Admission medications   Medication Sig Start Date End Date Taking? Authorizing Provider  aspirin 81 MG tablet Take 81 mg by mouth daily.     Yes [provider]  bisoprolol (ZEBETA) 5 MG tablet TAKE 1 TABLET BY MOUTH EVERY DAY Patient taking differently: Take 5 mg by mouth daily. TAKE 1 TABLET BY MOUTH EVERY DAY 03/24/20  Yes Herminio Commons, MD  enoxaparin (LOVENOX) 150 MG/ML injection Inject 1 mL (150 mg total) into the skin daily for 10 days. 04/17/20 04/27/20 Yes Satira Sark, MD  finasteride (PROSCAR) 5 MG tablet Take 5 mg by mouth Daily.  11/28/10  Yes [provider]  nitroGLYCERIN (NITROSTAT) 0.4 MG SL tablet Place 1 tablet (0.4 mg total) under the  tongue every 5 (five) minutes as needed for chest pain. 05/08/18  Yes Satira Sark, MD  rosuvastatin (CRESTOR) 20 MG tablet TAKE 1 TABLET (20 MG TOTAL) BY MOUTH DAILY. Patient taking differently: Take 20 mg by mouth daily.  01/13/20  Yes Satira Sark, MD  sacubitril-valsartan (ENTRESTO) 24-26 MG Take 1 tablet by mouth 2 (two) times daily. 07/24/19  Yes Satira Sark, MD  spironolactone (ALDACTONE) 25 MG tablet TAKE 1 TABLET BY MOUTH EVERY DAY Patient taking differently: Take 25 mg by mouth daily.  02/10/20  Yes  Satira Sark, MD  warfarin (COUMADIN) 5 MG tablet TAKE 1/2 TABLET DAILY EXCEPT 1 TABLET ON SUNDAYS AND THURSDAYS Patient taking differently: Take 2.5-5 mg by mouth See admin instructions. 2.5 mg Mon. Wed. Fri, and Sat, 5 mg all other days 07/30/19  Yes Satira Sark, MD    Allergies as of 03/20/2020  . (No Known Allergies)    Family History  Problem Relation Age of Onset  . Cancer Mother        lymphoma   . Heart attack Father   . Heart disease Brother   . Colon cancer Neg Hx   . Colon polyps Neg Hx     Social History   Socioeconomic History  . Marital status: Married    Spouse name: Not on file  . Number of children: Not on file  . Years of education: Not on file  . Highest education level: Not on file  Occupational History  . Occupation: retired    Comment: Science writer  Tobacco Use  . Smoking status: Never Smoker  . Smokeless tobacco: Never Used  Vaping Use  . Vaping Use: Never used  Substance and Sexual Activity  . Alcohol use: Yes    Alcohol/week: 0.0 standard drinks    Comment: occasional  . Drug use: No  . Sexual activity: Not on file  Other Topics Concern  . Not on file  Social History Narrative  . Not on file   Social Determinants of Health   Financial Resource Strain:   . Difficulty of Paying Living Expenses:   Food Insecurity:   . Worried About Charity fundraiser in the Last Year:   . Arboriculturist in the Last Year:   Transportation Needs:   . Film/video editor (Medical):   [name] Lack of Transportation (Non-Medical):   Physical Activity:   . Days of Exercise per Week:   . Minutes of Exercise per Session:   Stress:   . Feeling of Stress :   Social Connections:   . Frequency of Communication with Friends and Family:   . Frequency of Social Gatherings with Friends and Family:   . Attends Religious Services:   . Active Member of Clubs or Organizations:   . Attends Archivist Meetings:   [name] Marital Status:   Intimate Partner  Violence:   . Fear of Current or Ex-Partner:   . Emotionally Abused:   [name] Physically Abused:   . Sexually Abused:     Review of Systems: See HPI, otherwise negative ROS  Physical Exam: BP 101/70   Pulse 91   Temp (!) 97.4 F (36.3 C) (Oral)   Resp 16   Ht 6\' 1"  (1.854 m)   SpO2 97%   BMI 29.90 kg/m  General:   Alert,  Well-developed, well-nourished, pleasant and cooperative in NAD Neck:  Supple; no masses or thyromegaly. No significant cervical adenopathy. Lungs:  Clear throughout to  auscultation.   No wheezes, crackles, or rhonchi. No acute distress. Heart:  Regular rate and rhythm; no murmurs, clicks, rubs,  or gallops. Abdomen: Non-distended, normal bowel sounds.  Soft and nontender without appreciable mass or hepatosplenomegaly.  Pulses:  Normal pulses noted. Extremities:  Without clubbing or edema.  Impression/Plan: 69 year old gentleman with nonspecific change in bowel habits improved with fiber and probiotic.  Is been 10 years since she had a colonoscopy.  No alarm symptoms otherwise.  AICD in place.  I have offered the patient a diagnostic colonoscopy today per plan -  The risks, benefits, limitations, alternatives and imponderables have been reviewed with the patient. Questions have been answered. All parties are agreeable.      Notice: This dictation was prepared with Dragon dictation along with smaller phrase technology. Any transcriptional errors that result from this process are unintentional and may not be corrected upon review.

## 2020-04-21 NOTE — Op Note (Signed)
[facility] Patient Name: [name] Procedure Date: 04/21/2020 7:01 AM MRN: [id number] Date of Birth: 09/15/1951 Attending MD: Norvel Richards , MD CSN: [id number] Age: 69 Admit Type: Outpatient Procedure:                Colonoscopy Indications:              Change in bowel habits -recently improved with                            probiotic and fiber Providers:                Norvel Richards, MD, Charlsie Quest. Theda Sers RN, RN,                            Aram Candela Referring MD:              Medicines:                , Meperidine 40 mg IV, Midazolam 3 mg IV Complications:            No immediate complications. Estimated Blood Loss:     Estimated blood loss was minimal. Procedure:                Pre-Anesthesia Assessment:                           - Prior to the procedure, a History and Physical                            was performed, and patient medications and                            allergies were reviewed. The patient's tolerance of                            previous anesthesia was also reviewed. The risks                            and benefits of the procedure and the sedation                            options and risks were discussed with the patient.                            All questions were answered, and informed consent                            was obtained. Prior Anticoagulants: The patient                            last took Coumadin (warfarin) 4 days and Lovenox                            (enoxaparin) 1 day prior to the procedure. ASA  Grade Assessment: III - A patient with severe                            systemic disease. After reviewing the risks and                            benefits, the patient was deemed in satisfactory                            condition to undergo the procedure.                           After obtaining informed consent, the colonoscope                            was passed under direct  vision. Throughout the                            procedure, the patient's blood pressure, pulse, and                            oxygen saturations were monitored continuously. The                            CF-[id number] ([id number]) scope was introduced through                            the anus and advanced to the the cecum, identified                            by appendiceal orifice and ileocecal valve. Scope In: 9:03:07 AM Scope Out: 9:20:19 AM Scope Withdrawal Time: 0 hours 9 minutes 24 seconds  Total Procedure Duration: 0 hours 17 minutes 12 seconds  Findings:      The perianal and digital rectal examinations were normal.      Scattered medium-mouthed diverticula were found in the sigmoid colon.      Three sessile polyps were found in the hepatic flexure and cecum. The       polyps were 5 to 7 mm in size. These polyps were removed with a cold       snare. Resection and retrieval were complete. Estimated blood loss was       minimal.      The exam was otherwise without abnormality on direct and retroflexion       views. Single biopsy of the left colon was taken to further evaluate for       microscopic colitis Impression:               - Diverticulosis in the sigmoid colon.                           - Three 5 to 7 mm polyps, removed with a cold                            snare. Resected and retrieved. Status post  segmental biopsy.                           - The examination was otherwise normal on direct                            and retroflexion views. Moderate Sedation:      Moderate (conscious) sedation was administered by the endoscopy nurse       and supervised by the endoscopist. The following parameters were       monitored: oxygen saturation, heart rate, blood pressure, respiratory       rate, EKG, adequacy of pulmonary ventilation, and response to care.       Total physician intraservice time was 40 minutes. Recommendation:           - Patient has  a contact number available for                            emergencies. The signs and symptoms of potential                            delayed complications were discussed with the                            patient. Return to normal activities tomorrow.                            Written discharge instructions were provided to the                            patient.                           - Advance diet as tolerated. Follow-up on                            pathology. Resume Coumadin and Lovenox today.                            Continue Lovenox until INR therapeutic. Office                            visit with [place] in 6 to 8 weeks. Procedure Code(s):        --- Professional ---                           [phone], Colonoscopy, flexible; with removal of                            tumor(s), polyp(s), or other lesion(s) by snare                            technique                           99153, Moderate sedation; each additional 15  minutes intraservice time                           99153, Moderate sedation; each additional 15                            minutes intraservice time                           G0500, Moderate sedation services provided by the                            same physician or other qualified health care                            professional performing a gastrointestinal                            endoscopic service that sedation supports,                            requiring the presence of an independent trained                            observer to assist in the monitoring of the                            patient's level of consciousness and physiological                            status; initial 15 minutes of intra-service time;                            patient age 81 years or older (additional time may                            be reported with [phone], as appropriate) Diagnosis Code(s):        --- Professional ---                            K63.5, Polyp of colon                           R19.4, Change in bowel habit                           K57.30, Diverticulosis of large intestine without                            perforation or abscess without bleeding CPT copyright 2019 American Medical Association. All rights reserved. The codes documented in this report are preliminary and upon coder review may  be revised to meet current compliance requirements. Cristopher Estimable. Zayan Delvecchio, MD Norvel Richards, MD 04/21/2020 9:54:39 AM This report has been signed electronically. Number of Addenda: 0

## 2020-04-22 ENCOUNTER — Other Ambulatory Visit: Payer: Self-pay | Admitting: Cardiology

## 2020-04-22 ENCOUNTER — Telehealth: Payer: Self-pay

## 2020-04-22 ENCOUNTER — Encounter: Payer: Self-pay | Admitting: Internal Medicine

## 2020-04-22 LAB — SURGICAL PATHOLOGY

## 2020-04-22 NOTE — Telephone Encounter (Signed)
Per Dr.Rourk- Send letter to patient.  Send copy of letter with path to referring provider and PCP.  OV 3 months

## 2020-04-27 ENCOUNTER — Ambulatory Visit (INDEPENDENT_AMBULATORY_CARE_PROVIDER_SITE_OTHER): Payer: BC Managed Care – PPO | Admitting: *Deleted

## 2020-04-27 DIAGNOSIS — I480 Paroxysmal atrial fibrillation: Secondary | ICD-10-CM | POA: Diagnosis not present

## 2020-04-27 DIAGNOSIS — Z5181 Encounter for therapeutic drug level monitoring: Secondary | ICD-10-CM | POA: Diagnosis not present

## 2020-04-27 LAB — POCT INR: INR: 2.3 (ref 2.0–3.0)

## 2020-04-27 NOTE — Patient Instructions (Signed)
Continue warfarin 1/2 tablet daily except 1 tablet on Sundays, Tuesdays and Thursdays  Recheck in 4 weeks 

## 2020-04-29 ENCOUNTER — Ambulatory Visit (INDEPENDENT_AMBULATORY_CARE_PROVIDER_SITE_OTHER): Payer: BC Managed Care – PPO

## 2020-04-29 ENCOUNTER — Telehealth: Payer: Self-pay

## 2020-04-29 DIAGNOSIS — Z95810 Presence of automatic (implantable) cardiac defibrillator: Secondary | ICD-10-CM

## 2020-04-29 DIAGNOSIS — I5022 Chronic systolic (congestive) heart failure: Secondary | ICD-10-CM

## 2020-04-29 NOTE — Telephone Encounter (Signed)
Remote ICM transmission received.  Attempted call to patient regarding ICM remote transmission and left detailed message per DPR.  Advised to return call for any fluid symptoms or questions. Next ICM remote transmission scheduled 06/01/2020.     

## 2020-04-29 NOTE — Progress Notes (Signed)
EPIC Encounter for ICM Monitoring  Patient Name: [name] is a 69 y.o. male Date: 04/29/2020 Primary Care Physican: Rory Percy, MD Primary Cardiologist:McDowell Electrophysiologist:Allred 03/18/2020 Weight: 220 lb baseline   Attempted call to patient and unable to reach.  Left detailed message per DPR regarding transmission. Transmission reviewed.   CorvueThoracic impedancereturned to normal since 04/20/2020 remote transmission.  Prescribed:  Spironolactone 25 mg take 1 tablet daily.  Recommendations:Left voice mail with ICM number and encouraged to call if experiencing any fluid symptoms.  Follow-up plan: ICM clinic phone appointment on9/13/2021. 91 day device clinic remote transmission10/18/2021.   EP/CardiologyOffice visits:Dr Domenic Polite on9/23/2021and Dr [name] 06/19/2020.   Copy of ICM check sent to Dr.Allred and Dr Domenic Polite for [facility] that fluid levels suggests has returned to normal.   3 month ICM trend: 04/29/2020    1 Year ICM trend:       Rosalene Billings, RN 04/29/2020 10:44 AM

## 2020-04-30 ENCOUNTER — Encounter: Payer: Self-pay | Admitting: Internal Medicine

## 2020-05-01 ENCOUNTER — Encounter (HOSPITAL_COMMUNITY): Payer: Self-pay | Admitting: Internal Medicine

## 2020-05-06 NOTE — Telephone Encounter (Signed)
OV made °

## 2020-05-22 ENCOUNTER — Encounter: Payer: BC Managed Care – PPO | Admitting: Internal Medicine

## 2020-05-26 ENCOUNTER — Ambulatory Visit (INDEPENDENT_AMBULATORY_CARE_PROVIDER_SITE_OTHER): Payer: BC Managed Care – PPO | Admitting: Pharmacist

## 2020-05-26 DIAGNOSIS — Z5181 Encounter for therapeutic drug level monitoring: Secondary | ICD-10-CM

## 2020-05-26 DIAGNOSIS — I480 Paroxysmal atrial fibrillation: Secondary | ICD-10-CM | POA: Diagnosis not present

## 2020-05-26 LAB — POCT INR: INR: 2.5 (ref 2.0–3.0)

## 2020-05-26 NOTE — Patient Instructions (Signed)
Description   Continue warfarin 1/2 tablet daily except 1 tablet on Sundays, Tuesdays and Thursdays.  Recheck in 6 weeks. Chat with Dr Domenic Polite about changing to Eliquis (we can use a $10/month copay card)

## 2020-06-01 ENCOUNTER — Ambulatory Visit (INDEPENDENT_AMBULATORY_CARE_PROVIDER_SITE_OTHER): Payer: BC Managed Care – PPO

## 2020-06-01 DIAGNOSIS — Z95810 Presence of automatic (implantable) cardiac defibrillator: Secondary | ICD-10-CM | POA: Diagnosis not present

## 2020-06-01 DIAGNOSIS — I5022 Chronic systolic (congestive) heart failure: Secondary | ICD-10-CM | POA: Diagnosis not present

## 2020-06-05 NOTE — Progress Notes (Signed)
EPIC Encounter for ICM Monitoring  Patient Name: [name] is a 69 y.o. male Date: 06/05/2020 Primary Care Physican: Rory Percy, MD Primary Cardiologist:McDowell Electrophysiologist:Allred 06/05/2020 Weight: 220 lb baseline   Spoke with patient and reports feeling well at this time.  Denies fluid symptoms.    CorvueThoracic impedancesuggests normal fluid levels.  Prescribed:  Spironolactone 25 mg take 1 tablet daily.  Recommendations: No changes and encouraged to call if experiencing any fluid symptoms.  Follow-up plan: ICM clinic phone appointment on10/19/2021. 91 day device clinic remote transmission10/18/2021.   EP/CardiologyOffice visits:06/11/2020 with Dr Domenic Polite.  06/19/2020 with Dr [name].   Copy of ICM check sent to Dr.Allred.    3 month ICM trend: 06/03/2020    1 Year ICM trend:       Rosalene Billings, RN 06/05/2020 3:08 PM

## 2020-06-11 ENCOUNTER — Encounter: Payer: Self-pay | Admitting: Cardiology

## 2020-06-11 ENCOUNTER — Ambulatory Visit: Payer: BC Managed Care – PPO | Admitting: Cardiology

## 2020-06-11 VITALS — BP 92/70 | HR 74 | Ht 73.0 in | Wt 226.0 lb

## 2020-06-11 DIAGNOSIS — I5022 Chronic systolic (congestive) heart failure: Secondary | ICD-10-CM

## 2020-06-11 DIAGNOSIS — I4819 Other persistent atrial fibrillation: Secondary | ICD-10-CM

## 2020-06-11 DIAGNOSIS — I255 Ischemic cardiomyopathy: Secondary | ICD-10-CM

## 2020-06-11 NOTE — Patient Instructions (Signed)

## 2020-06-11 NOTE — Progress Notes (Signed)
Cardiology Office Note  Date: 06/11/2020   ID: [name], DOB August 31, 1951, MRN [id number]  PCP:  Rory Percy, MD  Cardiologist:  Rozann Lesches, MD Electrophysiologist:  Thompson Grayer, MD   Chief Complaint  Patient presents with   Cardiac follow-up    History of Present Illness: [name] is a 69 y.o. male last seen in March.  He presents for a routine visit.  Reports no change in stamina, no angina symptoms or palpitations, NYHA class II dyspnea.  He is wife enjoy this time of year traveling to follow [facility] football.  He remains on Coumadin with follow-up in the anticoagulation clinic.  INR was 2.5.  We did discuss potential conversion to Eliquis which he has been considering.  He follows with [name], St. Jude ICD in place.  Recent thoracic impedance was within normal range.  He denies any device shocks or syncope.  I reviewed his medications which are stable from a cardiac perspective and outlined below.  We did discuss obtaining a follow-up echocardiogram in comparison to the study from last September.  Past Medical History:  Diagnosis Date   AICD (automatic cardioverter/defibrillator) present 2006   ICD   Anterior myocardial infarction ([place])    BCC (basal cell carcinoma) superficial 11/22/2016   Left back sholder   Chronic systolic heart failure ([place])    Coronary atherosclerosis of native coronary artery    a. s/p BMS to LAD and OM1 b. 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[place])    Ischemic cardiomyopathy    LVEF 30%, status post ICD   OSA (obstructive sleep apnea)    unable to tolerate CPAP   Paroxysmal atrial fibrillation ([facility])    On coumadin   Pseudoaneurysm ([facility])    SCC (squamous cell carcinoma) 08/04/2006   right shoulder (CX35FU)   SCC (squamous cell carcinoma) well differentiated 12/13/2011   right temple   Squamous cell carcinoma in situ  (SCCIS) 08/04/2006   right sholder   Stroke [facility])     Past Surgical History:  Procedure Laterality Date   AICD implantation  2011   St Jude ICD implanted for primary prevention of sudden death   BIOPSY  04/27/2020   Procedure: BIOPSY;  Surgeon: [name], MD;  [facility];  Service: Endoscopy;;   CATARACT EXTRACTION W/PHACO Left 03/27/2017   Procedure: CATARACT EXTRACTION PHACO AND INTRAOCULAR LENS PLACEMENT ([place]);  Surgeon: [name], MD;  Location: [facility] ORS;  Service: Ophthalmology;  Laterality: Left;  CDE: 8.48   CATARACT EXTRACTION W/PHACO Right 05/29/2017   Procedure: CATARACT EXTRACTION PHACO AND INTRAOCULAR LENS PLACEMENT ([facility]);  Surgeon: [name], MD;  Location: [facility] ORS;  Service: Ophthalmology;  Laterality: Right;  CDE: 7.48   COLONOSCOPY N/A 2020/04/27   Procedure: COLONOSCOPY;  Surgeon: [name], MD;  Location[facility] END[facility];  Service: Endoscopy;  Laterality: N/A;  8:30am - moved to 9 start - Arnoldo Morale has a block   EYE SURGERY     ICD GENERATOR CHANGEOUT N/A 01/19/2017   SJM Ellipse VR ICD generator change by Dr [name]   LAPAROSCOPY N/A 10/23/2017   Procedure: LAPAROSCOPY DIAGNOSTIC;  Surgeon: [name], MD;  Location: [place];  Service: General;  Laterality: N/A;   LEFT HEART CATH AND CORONARY ANGIOGRAPHY N/A 06/21/2018   Procedure: LEFT HEART CATH AND CORONARY ANGIOGRAPHY;  Surgeon: [name], MD;  Location: [facility]  INVASIVE CV LAB;  Service: Cardiovascular;  Laterality: N/A;   POLYPECTOMY  04/21/2020   Procedure: POLYPECTOMY;  Surgeon: [name], MD;  [facility];  Service: Endoscopy;;   VENTRAL HERNIA REPAIR N/A 10/23/2017   Procedure: VENTRAL HERNIA REPAIR;  Surgeon: [name], MD;  Location: [place];  Service: General;  Laterality: N/A;    Current Outpatient Medications  Medication Sig Dispense Refill   aspirin 81 MG tablet Take 81 mg by mouth daily.       bisoprolol (ZEBETA) 5 MG tablet TAKE 1 TABLET BY MOUTH  EVERY DAY (Patient taking differently: Take 5 mg by mouth daily. TAKE 1 TABLET BY MOUTH EVERY DAY) 90 tablet 2   ENTRESTO 24-26 MG TAKE 1 TABLET BY MOUTH TWICE A DAY 180 tablet 2   finasteride (PROSCAR) 5 MG tablet Take 5 mg by mouth Daily.      nitroGLYCERIN (NITROSTAT) 0.4 MG SL tablet Place 1 tablet (0.4 mg total) under the tongue every 5 (five) minutes as needed for chest pain. 25 tablet 0   rosuvastatin (CRESTOR) 20 MG tablet TAKE 1 TABLET (20 MG TOTAL) BY MOUTH DAILY. (Patient taking differently: Take 20 mg by mouth daily. ) 90 tablet 1   spironolactone (ALDACTONE) 25 MG tablet TAKE 1 TABLET BY MOUTH EVERY DAY (Patient taking differently: Take 25 mg by mouth daily. ) 90 tablet 1   warfarin (COUMADIN) 5 MG tablet TAKE 1/2 TABLET DAILY EXCEPT 1 TABLET ON SUNDAYS AND THURSDAYS (Patient taking differently: Take 2.5-5 mg by mouth See admin instructions. 2.5 mg Mon. Wed. Fri, and Sat, 5 mg all other days) 54 tablet 1   enoxaparin (LOVENOX) 150 MG/ML injection Inject 1 mL (150 mg total) into the skin daily for 10 days. 10 mL 0   No current facility-administered medications for this visit.   Allergies:  Patient has no known allergies.   ROS:   No orthopnea or PND.  Physical Exam: VS:  BP 92/70    Pulse 74    Ht 6\' 1"  (1.854 m)    Wt 226 lb (102.5 kg)    SpO2 96%    BMI 29.82 kg/m , BMI Body mass index is 29.82 kg/m.  Wt Readings from Last 3 Encounters:  06/11/20 226 lb (102.5 kg)  02/26/20 226 lb 9.6 oz (102.8 kg)  12/05/19 230 lb (104.3 kg)    General: Patient appears comfortable at rest. HEENT: Conjunctiva and lids normal, wearing a mask. Neck: Supple, no elevated JVP or carotid bruits, no thyromegaly. Lungs: Clear to auscultation, nonlabored breathing at rest. Cardiac: Regular rate and rhythm, no S3 or significant systolic murmur. Extremities: No pitting edema.  ECG:  An ECG dated 12/02/2019 was personally reviewed today and demonstrated:  Atrial fibrillation with PVC,  nonspecific T wave changes.  Recent Labwork: 12/02/2019: BUN 20; Creatinine, Ser 1.26; NT-Pro BNP 817; Potassium 4.7; Sodium 139  July 2021: BUN 16, creatinine 1.18, potassium 4.4, AST 15, ALT 13  Other Studies Reviewed Today:  Echocardiogram 06/12/2019: 1. Left ventricular ejection fraction, by visual estimation, is 25 to  30%. The endocardium is poorly visualized limiting evaluation of LVEF,  consider contrast study.The left ventricle has severely decreased  function. There is no left ventricular  hypertrophy.  2. Left ventricular diastolic Doppler parameters are indeterminate  pattern of LV diastolic filling.  3. The left ventricle was not well visualized.  4. The anteroseptal, anterior, anterolateral walls are akinetic. 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 Poor visualization, cannot  evaluate sufficiently for thrombus, consider possible contrast study.  5. Global right ventricle has normal systolic function.The right  ventricular size is normal. No increase in right ventricular wall  thickness.  6. Left atrial size was severely dilated.  7. Right atrial size was mildly dilated.  8. The pericardium was not well visualized.  9. The mitral valve is normal in structure. Mild mitral valve  regurgitation. No evidence of mitral stenosis.  10. The tricuspid valve is normal in structure. Tricuspid valve  regurgitation is mild.  11. The aortic valve is tricuspid Aortic valve regurgitation is mild by  color flow Doppler. Structurally normal aortic valve, with no evidence of  sclerosis or stenosis.  12. The pulmonic valve was not well visualized. Pulmonic valve  regurgitation is not visualized by color flow Doppler.  13. The aortic root was not well visualized.  14. There is mild dilatation of the aortic root measuring 40 mm.  15. Normal pulmonary artery systolic pressure.  16. A pacer wire is visualized.  17. The interatrial septum was not well visualized.    Assessment and Plan:  1.  Chronic systolic heart failure, clinically stable, recent thoracic impedance normal and no significant change in weight.  2.  Ischemic cardiomyopathy, LVEF 25 to 30% as of September 2020.  He has a St. Jude ICD in place, no device shocks or syncope.  We will obtain a follow-up echocardiogram.  Continue bisoprolol, Entresto, Aldactone, and Crestor.  3.  Persistent atrial fibrillation, no significant palpitations.  He has been on Coumadin for stroke prophylaxis, arranging discussion in anticoagulation clinic for switch to Eliquis.  4.  CAD status post previous anterior wall infarct.  He continues to do well without active angina.  We have kept him on low-dose aspirin along with Crestor.  Medication Adjustments/Labs and Tests Ordered: Current medicines are reviewed at length with the patient today.  Concerns regarding medicines are outlined above.   Tests Ordered: Orders Placed This Encounter  Procedures   ECHOCARDIOGRAM COMPLETE    Medication Changes: No orders of the defined types were placed in this encounter.   Disposition:  Follow up 6 months in the [place] office.  Signed, Satira Sark, MD, [facility] 06/11/2020 9:11 AM    [place] at [facility] [address], [place], [place] [address] Phone: [phone]; Fax: [phone]

## 2020-06-19 ENCOUNTER — Ambulatory Visit (INDEPENDENT_AMBULATORY_CARE_PROVIDER_SITE_OTHER): Payer: BC Managed Care – PPO | Admitting: Internal Medicine

## 2020-06-19 VITALS — BP 86/70 | HR 82 | Ht 73.0 in | Wt 227.4 lb

## 2020-06-19 DIAGNOSIS — I255 Ischemic cardiomyopathy: Secondary | ICD-10-CM

## 2020-06-19 DIAGNOSIS — I519 Heart disease, unspecified: Secondary | ICD-10-CM

## 2020-06-19 DIAGNOSIS — I4819 Other persistent atrial fibrillation: Secondary | ICD-10-CM

## 2020-06-19 DIAGNOSIS — D6869 Other thrombophilia: Secondary | ICD-10-CM | POA: Diagnosis not present

## 2020-06-19 LAB — CUP PACEART INCLINIC DEVICE CHECK
Battery Remaining Longevity: 74 mo
Brady Statistic RV Percent Paced: 0.36 %
Date Time Interrogation Session: 20211001120834
HighPow Impedance: 53.7188
Implantable Lead Implant Date: 20020111
Implantable Lead Location: 753860
Implantable Lead Model: 148
Implantable Lead Serial Number: 116740
Implantable Pulse Generator Implant Date: 20180503
Lead Channel Impedance Value: 700 Ohm
Lead Channel Pacing Threshold Amplitude: 0.75 V
Lead Channel Pacing Threshold Pulse Width: 0.5 ms
Lead Channel Sensing Intrinsic Amplitude: 12 mV
Lead Channel Setting Pacing Amplitude: 2.5 V
Lead Channel Setting Pacing Pulse Width: 0.5 ms
Lead Channel Setting Sensing Sensitivity: 0.5 mV
Pulse Gen Serial Number: 7421148

## 2020-06-19 MED ORDER — NITROGLYCERIN 0.4 MG SL SUBL: 0.4000 mg | SUBLINGUAL_TABLET | SUBLINGUAL | 3 refills | Status: DC | PRN

## 2020-06-19 NOTE — Progress Notes (Signed)
PCP: Rory Percy, MD Primary Cardiologist: Dr Domenic Polite Primary EP: Dr Elyn Aquas is a 69 y.o. male who presents today for routine electrophysiology followup.  Since last being seen in our clinic, the patient reports doing very well.  Today, he denies symptoms of palpitations, chest pain, shortness of breath,  lower extremity edema, dizziness, presyncope, syncope, or ICD shocks.  The patient is otherwise without complaint today.   Past Medical History:  Diagnosis Date  . AICD (automatic cardioverter/defibrillator) present 2006   ICD  . Anterior myocardial infarction ([place])   . BCC (basal cell carcinoma) superficial 11/22/2016   Left back sholder  . Chronic systolic heart failure ([place])   . Coronary atherosclerosis of native coronary artery    a. s/p BMS to LAD and OM1 b. patent stents by cath in 2013  . History of hiatal hernia   . Hypertension   . Inducible ventricular tachycardia ([place])   . Ischemic cardiomyopathy    LVEF 30%, status post ICD  . OSA (obstructive sleep apnea)    unable to tolerate CPAP  . Paroxysmal atrial fibrillation ([facility])    On coumadin  . Pseudoaneurysm ([place])   . SCC (squamous cell carcinoma) 08/04/2006   right shoulder (CX35FU)  . SCC (squamous cell carcinoma) well differentiated 12/13/2011   right temple  . 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 Stroke [facility])    Past Surgical History:  Procedure Laterality Date  . AICD implantation  2011   St Jude ICD implanted for primary prevention of sudden death  . BIOPSY  04-29-20   Procedure: BIOPSY;  Surgeon: [name], MD;  [facility];  Service: Endoscopy;;  . CATARACT EXTRACTION W/PHACO Left 03/27/2017   Procedure: CATARACT EXTRACTION PHACO AND INTRAOCULAR LENS PLACEMENT ([place]);  Surgeon: [name], MD;  Location: [facility] ORS;  Service: Ophthalmology;  Laterality: Left;  CDE: 8.48  . CATARACT EXTRACTION W/PHACO Right 05/29/2017   Procedure: CATARACT  EXTRACTION PHACO AND INTRAOCULAR LENS PLACEMENT ([facility]);  Surgeon: [name], MD;  Location: [facility] ORS;  Service: Ophthalmology;  Laterality: Right;  CDE: 7.48  . COLONOSCOPY N/A Apr 29, 2020   Procedure: COLONOSCOPY;  Surgeon: [name], MD;  [facility];  Service: Endoscopy;  Laterality: N/A;  8:30am - moved to 9 start - Arnoldo Morale has a block  . EYE SURGERY    . ICD GENERATOR CHANGEOUT N/A 01/19/2017   SJM Ellipse VR ICD generator change by Dr [name]  . LAPAROSCOPY N/A 10/23/2017   Procedure: LAPAROSCOPY DIAGNOSTIC;  Surgeon: [name], MD;  Location: [place];  Service: General;  Laterality: N/A;  . LEFT HEART CATH AND CORONARY ANGIOGRAPHY N/A 06/21/2018   Procedure: LEFT HEART CATH AND CORONARY ANGIOGRAPHY;  Surgeon: [name], MD;  Location: [place] CV LAB;  Service: Cardiovascular;  Laterality: N/A;  . POLYPECTOMY  04-29-20   Procedure: POLYPECTOMY;  Surgeon: [name], MD;  [facility];  Service: Endoscopy;;  . VENTRAL HERNIA REPAIR N/A 10/23/2017   Procedure: [place];  Surgeon: [name], MD;  Location: [place];  Service: General;  Laterality: N/A;    ROS- all systems are reviewed and negative except as per HPI above  Current Outpatient Medications  Medication Sig Dispense Refill  . aspirin 81 MG tablet Take 81 mg by mouth daily.      . bisoprolol (ZEBETA) 5 MG tablet TAKE 1 TABLET BY MOUTH EVERY DAY (Patient taking  differently: Take 5 mg by mouth daily. TAKE 1 TABLET BY MOUTH EVERY DAY) 90 tablet 2  . ENTRESTO 24-26 MG TAKE 1 TABLET BY MOUTH TWICE A DAY 180 tablet 2  . finasteride (PROSCAR) 5 MG tablet Take 5 mg by mouth Daily.     . nitroGLYCERIN (NITROSTAT) 0.4 MG SL tablet Place 1 tablet (0.4 mg total) under the tongue every 5 (five) minutes as needed for chest pain. 25 tablet 0  . rosuvastatin (CRESTOR) 20 MG tablet TAKE 1 TABLET (20 MG TOTAL) BY MOUTH DAILY. (Patient taking differently: Take 20 mg by mouth daily. ) 90  tablet 1  . spironolactone (ALDACTONE) 25 MG tablet TAKE 1 TABLET BY MOUTH EVERY DAY (Patient taking differently: Take 25 mg by mouth daily. ) 90 tablet 1  . warfarin (COUMADIN) 5 MG tablet TAKE 1/2 TABLET DAILY EXCEPT 1 TABLET ON SUNDAYS AND THURSDAYS (Patient taking differently: Take 2.5-5 mg by mouth See admin instructions. 2.5 mg Mon. Wed. Fri, and Sat, 5 mg all other days) 54 tablet 1   No current facility-administered medications for this visit.    Physical Exam: Vitals:   06/19/20 1140  BP: (!) 86/70  Pulse: 82  SpO2: 97%  Weight: 227 lb 6.4 oz (103.1 kg)  Height: 6\' 1"  (1.854 m)    GEN- The patient is well appearing, alert and oriented x 3 today.   Head- normocephalic, atraumatic Eyes-  Sclera clear, conjunctiva pink Ears- hearing intact Oropharynx- clear Lungs- , normal work of breathing Chest- ICD pocket is well healed Heart- irregular rate and rhythm  GI- soft, NT, ND, + BS Extremities- no clubbing, cyanosis, or edema  ICD interrogation- reviewed in detail today,  See PACEART report    Wt Readings from Last 3 Encounters:  06/19/20 227 lb 6.4 oz (103.1 kg)  06/11/20 226 lb (102.5 kg)  02/26/20 226 lb 9.6 oz (102.8 kg)    Assessment and Plan:  1.  Chronic systolic dysfunction/ ischemic CM euvolemic today Stable on an appropriate medical regimen Normal ICD function See [facility] report No changes today he is not device dependant today followed in ICM device clinic  2. Persistent afib Rate controlled On coumadin Dr Domenic Polite as recently discussed eliquis as a better alternative He is thinking about this further  3. CAD No ischemic symptoms  Risks, benefits and potential toxicities for medications prescribed and/or refilled reviewed with patient today.   Return in a year  Thompson Grayer MD, [facility] 06/19/2020 11:52 AM

## 2020-06-19 NOTE — Patient Instructions (Signed)
Medication Instructions:  Continue all current medications.  Labwork: none  Testing/Procedures: none  Follow-Up: 1 year   Any Other Special Instructions Will Be Listed Below (If Applicable).  If you need a refill on your cardiac medications before your next appointment, please call your pharmacy.  

## 2020-06-21 ENCOUNTER — Other Ambulatory Visit: Payer: Self-pay | Admitting: Cardiology

## 2020-07-07 ENCOUNTER — Ambulatory Visit (INDEPENDENT_AMBULATORY_CARE_PROVIDER_SITE_OTHER): Payer: BC Managed Care – PPO | Admitting: *Deleted

## 2020-07-07 ENCOUNTER — Ambulatory Visit (INDEPENDENT_AMBULATORY_CARE_PROVIDER_SITE_OTHER): Payer: BC Managed Care – PPO

## 2020-07-07 DIAGNOSIS — Z95810 Presence of automatic (implantable) cardiac defibrillator: Secondary | ICD-10-CM | POA: Diagnosis not present

## 2020-07-07 DIAGNOSIS — Z5181 Encounter for therapeutic drug level monitoring: Secondary | ICD-10-CM | POA: Diagnosis not present

## 2020-07-07 DIAGNOSIS — I5022 Chronic systolic (congestive) heart failure: Secondary | ICD-10-CM | POA: Diagnosis not present

## 2020-07-07 DIAGNOSIS — I480 Paroxysmal atrial fibrillation: Secondary | ICD-10-CM | POA: Diagnosis not present

## 2020-07-07 LAB — POCT INR: INR: 2.9 (ref 2.0–3.0)

## 2020-07-07 NOTE — Patient Instructions (Addendum)
Continue warfarin 1/2 tablet daily except 1 tablet on Sundays, Tuesdays and Thursdays.  Recheck in 6 weeks. Pt has decided to stay on warfarin for now.

## 2020-07-08 NOTE — Progress Notes (Signed)
EPIC Encounter for ICM Monitoring  Patient Name: [name] is a 69 y.o. male Date: 07/08/2020 Primary Care Physican: Rory Percy, MD Primary Cardiologist:[place] Electrophysiologist:Allred 06/05/2020 Weight: 220 lb baseline   Spoke with patient and reports feeling well at this time.  Denies fluid symptoms.  He is in [place] to watch [place] football game tonight.  CorvueThoracic impedancesuggests normal fluid levels.  Prescribed:  Spironolactone 25 mg take 1 tablet daily.  Recommendations: No changes and encouraged to call if experiencing any fluid symptoms.  Follow-up plan: ICM clinic phone appointment on11/22/2021. 91 day device clinic remote transmission1/17/2022.   EP/CardiologyOffice visits: 12/09/2020 with Dr Domenic Polite.  06/25/2021 with Dr [name].   Copy of ICM check sent to Dr.Allred   3 month ICM trend: 07/07/2020    1 Year ICM trend:       Rosalene Billings, RN 07/08/2020 1:55 PM

## 2020-07-10 ENCOUNTER — Other Ambulatory Visit: Payer: Self-pay | Admitting: Cardiology

## 2020-07-15 ENCOUNTER — Ambulatory Visit (INDEPENDENT_AMBULATORY_CARE_PROVIDER_SITE_OTHER): Payer: BC Managed Care – PPO

## 2020-07-15 ENCOUNTER — Telehealth: Payer: Self-pay | Admitting: *Deleted

## 2020-07-15 DIAGNOSIS — Z23 Encounter for immunization: Secondary | ICD-10-CM

## 2020-07-15 DIAGNOSIS — I255 Ischemic cardiomyopathy: Secondary | ICD-10-CM | POA: Diagnosis not present

## 2020-07-15 LAB — ECHOCARDIOGRAM COMPLETE
Area-P 1/2: 3.67 cm2
Calc EF: 40 %
MV M vel: 3.6 m/s
MV Peak grad: 51.7 mmHg
S' Lateral: 4.02 cm
Single Plane A2C EF: 44.7 %
Single Plane A4C EF: 34.1 %

## 2020-07-15 NOTE — Telephone Encounter (Signed)
-----   Message from Satira Sark, MD sent at 07/15/2020  1:14 PM EDT ----- Results reviewed.  In comparison, LVEF does look better, now at approximately 35%.  Mild to moderate mitral and tricuspid regurgitation which we will follow, not of major clinical significance at this time.  Continue current medications and follow-up plan.

## 2020-07-15 NOTE — Telephone Encounter (Signed)
Patient informed. Copy sent to PCP °

## 2020-07-28 ENCOUNTER — Other Ambulatory Visit: Payer: Self-pay

## 2020-07-28 ENCOUNTER — Encounter: Payer: Self-pay | Admitting: Gastroenterology

## 2020-07-28 ENCOUNTER — Ambulatory Visit: Payer: BC Managed Care – PPO | Admitting: Gastroenterology

## 2020-07-28 VITALS — BP 82/63 | HR 84 | Temp 97.1°F | Ht 73.0 in | Wt 229.0 lb

## 2020-07-28 DIAGNOSIS — R194 Change in bowel habit: Secondary | ICD-10-CM | POA: Diagnosis not present

## 2020-07-28 MED ORDER — RIFAXIMIN 550 MG PO TABS: 550.0000 mg | ORAL_TABLET | Freq: Three times a day (TID) | ORAL | 0 refills | Status: AC

## 2020-07-28 NOTE — Progress Notes (Signed)
Referring Provider: Rory Percy, MD Primary Care Physician:  Rory Percy, MD Primary GI: Dr. Gala Romney   Chief Complaint  Patient presents with  . Diarrhea    mostly in mornings    HPI:   CHASIN FINDLING is a 69 y.o. male presenting today with a history of postprandial formed stool with urgency, recommended to take fiber, with recent colonoscopy completed in interim from last visit with scattered medium-mouthed diverticula in sigmoid, three sessile polyps in hepatic flexure and cecum. 5-7 mm in size. Tubular adenomas. Benign colonic mucosa. 3 year surveillance.   Some watery stool. Postprandial. Doesn't happen after dinner. No relation to certain foods. At one point thought was cheese related but avoidance did not improve things. No abdominal pain. Very mild cramping. Present since fall of year. Benefiber helped out some with less frequency. Yogurt has helped as well, too. Has a few stools during morning then stops.   Past Medical History:  Diagnosis Date  . AICD (automatic cardioverter/defibrillator) present 2006   ICD  . Anterior myocardial infarction ([place])   . BCC (basal cell carcinoma) superficial 11/22/2016   Left back sholder  . Chronic systolic heart failure ([place])   . Coronary atherosclerosis of native coronary artery    a. s/p BMS to LAD and OM1 b. patent stents by cath in 2013  . History of hiatal hernia   . Hypertension   . Inducible ventricular tachycardia ([place])   . Ischemic cardiomyopathy    LVEF 30%, status post ICD  . OSA (obstructive sleep apnea)    unable to tolerate CPAP  . Paroxysmal atrial fibrillation ([facility])    On coumadin  . Pseudoaneurysm ([place])   . SCC (squamous cell carcinoma) 08/04/2006   right shoulder (CX35FU)  . SCC (squamous cell carcinoma) well differentiated 12/13/2011   right temple  . 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 Stroke [facility])     Past Surgical History:  Procedure Laterality Date  . AICD  implantation  2011   St Jude ICD implanted for primary prevention of sudden death  . BIOPSY  2020-05-15   Procedure: BIOPSY;  Surgeon: [name], MD;  [facility];  Service: Endoscopy;;  . CATARACT EXTRACTION W/PHACO Left 03/27/2017   Procedure: CATARACT EXTRACTION PHACO AND INTRAOCULAR LENS PLACEMENT ([place]);  Surgeon: [name], MD;  Location: [facility] OR[facility];  Service: Ophthalmology;  Laterality: Left;  CDE: 8.48  . CATARACT EXTRACTION W/PHACO Right 05/29/2017   Procedure: CATARACT EXTRACTION PHACO AND INTRAOCULAR LENS PLACEMENT ([facility]);  Surgeon: [name], MD;  Location: [facility];  Service: Ophthalmology;  Laterality: Right;  CDE: 7.48  . COLONOSCOPY N/A 15-May-2020   Scattered medium-mouthed diverticula in sigmoid, three sessile polyps in hepatic flexure and cecum. 5-7 mm in size. Tubular adenomas. Benign colonic mucosa. 3 year surveillance.   [name] EYE SURGERY    . ICD GENERATOR CHANGEOUT N/A 01/19/2017   SJM Ellipse VR ICD generator change by Dr [name]  . LAPAROSCOPY N/A 10/23/2017   Procedure: LAPAROSCOPY DIAGNOSTIC;  Surgeon: [name], MD;  Location: [place];  Service: General;  Laterality: N/A;  . LEFT HEART CATH AND CORONARY ANGIOGRAPHY N/A 06/21/2018   Procedure: LEFT HEART CATH AND CORONARY ANGIOGRAPHY;  Surgeon: [name], MD;  Location: [place] CV LAB;  Service: Cardiovascular;  Laterality: N/A;  . POLYPECTOMY  05-15-20   Procedure: POLYPECTOMY;  Surgeon: [name], MD;  [facility];  Service:  Endoscopy;;  . VENTRAL HERNIA REPAIR N/A 10/23/2017   Procedure: VENTRAL HERNIA REPAIR;  Surgeon: [name], MD;  Location: [place];  Service: General;  Laterality: N/A;    Current Outpatient Medications  Medication Sig Dispense Refill  . aspirin 81 MG tablet Take 81 mg by mouth daily.      . bisoprolol (ZEBETA) 5 MG tablet TAKE 1 TABLET BY MOUTH EVERY DAY (Patient taking differently: Take 5 mg by mouth daily. TAKE 1 TABLET BY MOUTH EVERY DAY) 90 tablet  2  . ENTRESTO 24-26 MG TAKE 1 TABLET BY MOUTH TWICE A DAY 180 tablet 2  . finasteride (PROSCAR) 5 MG tablet Take 5 mg by mouth Daily.     . nitroGLYCERIN (NITROSTAT) 0.4 MG SL tablet Place 1 tablet (0.4 mg total) under the tongue every 5 (five) minutes as needed for chest pain. 25 tablet 3  . rosuvastatin (CRESTOR) 20 MG tablet Take 1 tablet (20 mg total) by mouth daily. 90 tablet 3  . spironolactone (ALDACTONE) 25 MG tablet TAKE 1 TABLET BY MOUTH EVERY DAY (Patient taking differently: Take 25 mg by mouth daily. ) 90 tablet 1  . warfarin (COUMADIN) 5 MG tablet TAKE 1/2 TABLET DAILY EXCEPT 1 TABLET ON SUNDAYS, TUESDAYS AND THURSDAYS OR AS DIRECTED 90 tablet 2  . Wheat Dextrin (BENEFIBER ON THE GO PO) Take by mouth daily.     No current facility-administered medications for this visit.    Allergies as of 07/28/2020  . (No Known Allergies)    Family History  Problem Relation Age of Onset  . Cancer Mother        lymphoma   . Heart attack Father   . Heart disease Brother   . Colon cancer Neg Hx   . Colon polyps Neg Hx     Social History   Socioeconomic History  . Marital status: Married    Spouse name: Not on file  . Number of children: Not on file  . Years of education: Not on file  . Highest education level: Not on file  Occupational History  . Occupation: retired    Comment: Science writer  Tobacco Use  . Smoking status: Never Smoker  . Smokeless tobacco: Never Used  Vaping Use  . Vaping Use: Never used  Substance and Sexual Activity  . Alcohol use: Yes    Alcohol/week: 0.0 standard drinks    Comment: occasional  . Drug use: No  . Sexual activity: Not on file  Other Topics Concern  . Not on file  Social History Narrative  . Not on file   Social Determinants of Health   Financial Resource Strain:   . Difficulty of Paying Living Expenses: Not on file  Food Insecurity:   . Worried About Charity fundraiser in the Last Year: Not on file  . Ran Out of Food in the Last  Year: Not on file  Transportation Needs:   . Lack of Transportation (Medical): Not on file  . Lack of Transportation (Non-Medical): Not on file  Physical Activity:   . Days of Exercise per Week: Not on file  . Minutes of Exercise per Session: Not on file  Stress:   . Feeling of Stress : Not on file  Social Connections:   . Frequency of Communication with Friends and Family: Not on file  . Frequency of Social Gatherings with Friends and Family: Not on file  . Attends Religious Services: Not on file  . Active Member of Clubs or Organizations:  Not on file  . Attends Archivist Meetings: Not on file  . Marital Status: Not on file    Review of Systems: Gen: Denies fever, chills, anorexia. Denies fatigue, weakness, weight loss.  CV: Denies chest pain, palpitations, syncope, peripheral edema, and claudication. Resp: Denies dyspnea at rest, cough, wheezing, coughing up blood, and pleurisy. GI: see HPI Derm: Denies rash, itching, dry skin Psych: Denies depression, anxiety, memory loss, confusion. No homicidal or suicidal ideation.  Heme: Denies bruising, bleeding, and enlarged lymph nodes.  Physical Exam: BP (!) 82/63   Pulse 84   Temp (!) 97.1 F (36.2 C) (Temporal)   Ht 6\' 1"  (1.854 m)   Wt 229 lb (103.9 kg)   BMI 30.21 kg/m  General:   Alert and oriented. No distress noted. Pleasant and cooperative.  Head:  Normocephalic and atraumatic. Eyes:  Conjuctiva clear without scleral icterus. Mouth:  Mask in place Abdomen:  +BS, soft, non-tender and non-distended. No rebound or guarding. No HSM or masses noted. Msk:  Symmetrical without gross deformities. Normal posture. Extremities:  Without edema. Neurologic:  Alert and  oriented x4 Psych:  Alert and cooperative. Normal mood and affect.  ASSESSMENT: DELVIS KAU is a 69 y.o. male presenting today with history of postprandial formed stool with urgency, recommended to take fiber, with recent colonoscopy completed in  interim from last visit with scattered medium-mouthed diverticula in sigmoid, three sessile polyps in hepatic flexure and cecum. 5-7 mm in size. Tubular adenomas. Benign colonic mucosa for microscopi colitis. 3 year surveillance.   Postprandial component but no alarm signs/symptoms. Discussed supportive options including fiber (as this has helped), Bentyl, or short course of Xifaxan. If no improvement with Xifaxan, could consider Bentyl. Discussed with the [facility] Xifaxan, and he can go ahead and start this with INR check upcoming soon in next few weeks.   As of note, hypotensive today but asymptomatic. Cardiology aware; patient tends to stay in this range.    PLAN:  Xifaxan 550 mg TID X 14 days  Call with progress reports  Continue Benefiber  Colonoscopy 2024  Annitta Needs, PhD, [facility]

## 2020-07-28 NOTE — Patient Instructions (Signed)
I am going to talk to [place] before we start Xifaxan. This would be taken three times a day for 2 weeks. I want to make sure we don't need to make any changes to anything.  Let me know how you are doing after the course of treatment! We will see you in 6-8 months. If you are doing well, you can postpone that appointment.   Continue Benefiber and increase as tolerated.  I enjoyed seeing you again today! As you know, I value our relationship and want to provide genuine, compassionate, and quality care. I welcome your feedback. If you receive a survey regarding your visit,  I greatly appreciate you taking time to fill this out. See you next time!  Annitta Needs, PhD, ANP-BC [facility] [place] [facility]

## 2020-07-29 ENCOUNTER — Telehealth: Payer: Self-pay | Admitting: Internal Medicine

## 2020-07-29 NOTE — Telephone Encounter (Signed)
Noted  

## 2020-07-29 NOTE — Telephone Encounter (Signed)
Patient called and said that you called him.  He can be reached today

## 2020-07-29 NOTE — Telephone Encounter (Signed)
Attempted to call back. Had to leave message again. If he returns call, please let him know I reviewed with Edrick Oh, RN, at the [facility]. May go ahead and start Xifaxan. Eat plenty of leafy greens while on it. He has an INR check upcoming.

## 2020-07-30 NOTE — Progress Notes (Signed)
Cc'ed to pcp °

## 2020-08-04 ENCOUNTER — Other Ambulatory Visit: Payer: Self-pay

## 2020-08-04 ENCOUNTER — Encounter: Payer: Self-pay | Admitting: Urology

## 2020-08-04 ENCOUNTER — Ambulatory Visit (INDEPENDENT_AMBULATORY_CARE_PROVIDER_SITE_OTHER): Payer: BC Managed Care – PPO | Admitting: Urology

## 2020-08-04 VITALS — BP 96/66 | HR 70 | Temp 98.3°F | Wt 221.0 lb

## 2020-08-04 DIAGNOSIS — N401 Enlarged prostate with lower urinary tract symptoms: Secondary | ICD-10-CM

## 2020-08-04 LAB — URINALYSIS, ROUTINE W REFLEX MICROSCOPIC
Bilirubin, UA: NEGATIVE
Glucose, UA: NEGATIVE
Ketones, UA: NEGATIVE
Leukocytes,UA: NEGATIVE
Nitrite, UA: NEGATIVE
Protein,UA: NEGATIVE
Specific Gravity, UA: >1.03 — ABNORMAL HIGH (ref 1.005–1.030)
Urobilinogen, Ur: 0.2 mg/dL (ref 0.2–1.0)
pH, UA: 5.5 (ref 5.0–7.5)

## 2020-08-04 LAB — MICROSCOPIC EXAMINATION
Bacteria, UA: NONE SEEN
Epithelial Cells (non renal): NONE SEEN /hpf (ref 0–10)
Renal Epithel, UA: NONE SEEN /hpf
WBC, UA: NONE SEEN /hpf (ref 0–5)

## 2020-08-04 MED ORDER — FINASTERIDE 5 MG PO TABS: 5.0000 mg | ORAL_TABLET | Freq: Every day | ORAL | 3 refills | Status: DC

## 2020-08-04 NOTE — Progress Notes (Signed)
H&P  Chief Complaint: BPH w/ LUTS  History of Present Illness:   11.16.2021: Pt continues on finasteride but reports signficant LUTS. He was previously scheduled for a Urolift procedure but was unable to attend the procedure due to a concurrent hernia repair. He indicates nocturia is particularly concerning.  IPSS Questionnaire (AUA-7): Over the past month.   1)  How often have you had a sensation of not emptying your bladder completely after you finish urinating?  3 - About half the time  2)  How often have you had to urinate again less than two hours after you finished urinating? 3 - About half the time  3)  How often have you found you stopped and started again several times when you urinated?  2 - Less than half the time  4) How difficult have you found it to postpone urination?  3 - About half the time  5) How often have you had a weak urinary stream?  2 - Less than half the time  6) How often have you had to push or strain to begin urination?  1 - Less than 1 time in 5  7) How many times did you most typically get up to urinate from the time you went to bed until the time you got up in the morning?  3 - 3 times  Total score:  0-7 mildly symptomatic   8-19 moderately symptomatic   20-35 severely symptomatic  QoL score: 3  (below copied from [facility] records):  CC: I have an enlarged prostate (follow-up).  HPI: [name] is a 69 year-old male established patient who is here for an enlarged prostate follow-up evaluation. He is currently taking finasteride.   8.6.2019: IPSS 22 QOL score 4  Symptoms have worsened over the past year. He is still on finasteride.  He does not have a nickel allergy.  He would like to consider a Urolift procedure.  Pt had prostate U/S 9.11.2019--volume 32 ml  Length 4.53 cm  Width 4.5 cm  Height 3.0 cm   11.17.2020: Here today for follow-up. He reports stable sx's since last visit but he does note that he has been off finasteride for the last 4 weeks  -- there were errors with refilling his rx. He also has had two scheduled urolifts that were cancelled -- the first from a hernia operation and the second due to pneumonia (possible undiagnosed covid?). He has been tolerating his medication well but he does note he has been having increased issues with impotence.   Past Medical History:  Diagnosis Date  . AICD (automatic cardioverter/defibrillator) present 2006   ICD  . Anterior myocardial infarction ([place])   . BCC (basal cell carcinoma) superficial 11/22/2016   Left back sholder  . Chronic systolic heart failure ([place])   . Coronary atherosclerosis of native coronary artery    a. s/p BMS to LAD and OM1 b. patent stents by cath in 2013  . History of hiatal hernia   . Hypertension   . Inducible ventricular tachycardia ([place])   . Ischemic cardiomyopathy    LVEF 30%, status post ICD  . OSA (obstructive sleep apnea)    unable to tolerate CPAP  . Paroxysmal atrial fibrillation ([facility])    On coumadin  . Pseudoaneurysm ([place])   . SCC (squamous cell carcinoma) 08/04/2006   right shoulder (CX35FU)  . SCC (squamous cell carcinoma) well differentiated 12/13/2011   right temple  . 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  Stroke [facility])     Past Surgical History:  Procedure Laterality Date  . AICD implantation  2011   St Jude ICD implanted for primary prevention of sudden death  . BIOPSY  2020-05-20   Procedure: BIOPSY;  Surgeon: [name], MD;  Location: [facility];  Service: Endoscopy;;  . CATARACT EXTRACTION W/PHACO Left 03/27/2017   Procedure: CATARACT EXTRACTION PHACO AND INTRAOCULAR LENS PLACEMENT ([place]);  Surgeon: [name], MD;  Location: [facility] ORS;  Service: Ophthalmology;  Laterality: Left;  CDE: 8.48  . CATARACT EXTRACTION W/PHACO Right 05/29/2017   Procedure: CATARACT EXTRACTION PHACO AND INTRAOCULAR LENS PLACEMENT ([facility]);  Surgeon: [name], MD;  Location: [facility];  Service: Ophthalmology;  Laterality:  Right;  CDE: 7.48  . COLONOSCOPY N/A 05-20-2020   Scattered medium-mouthed diverticula in sigmoid, three sessile polyps in hepatic flexure and cecum. 5-7 mm in size. Tubular adenomas. Benign colonic mucosa. 3 year surveillance.   [name] EYE SURGERY    . ICD GENERATOR CHANGEOUT N/A 01/19/2017   S[facility] [place] VR ICD generator change by Dr [name]  . LAPAROSCOPY N/A 10/23/2017   Procedure: LAPAROSCOPY DIAGNOSTIC;  Surgeon: [name], MD;  Location: [place];  Service: General;  Laterality: N/A;  . LEFT HEART CATH AND CORONARY ANGIOGRAPHY N/A 06/21/2018   Procedure: LEFT HEART CATH AND CORONARY ANGIOGRAPHY;  Surgeon: [name], MD;  Location: [place] CV LAB;  Service: Cardiovascular;  Laterality: N/A;  . POLYPECTOMY  05/20/20   Procedure: POLYPECTOMY;  Surgeon: [name], MD;  [facility];  Service: Endoscopy;;  . VENTRAL HERNIA REPAIR N/A 10/23/2017   Procedure: [place];  Surgeon: [name], MD;  Location: [place];  Service: General;  Laterality: N/A;    Home Medications:  Allergies as of 08/04/2020   No Known Allergies     Medication List       Accurate as of August 04, 2020 11:39 AM. If you have any questions, ask your nurse or doctor.        aspirin 81 MG tablet Take 81 mg by mouth daily.   BENEFIBER ON THE GO PO Take by mouth daily.   bisoprolol 5 MG tablet Commonly known as: ZEBETA TAKE 1 TABLET BY MOUTH EVERY DAY What changed: additional instructions   Entresto 24-26 MG Generic drug: sacubitril-valsartan TAKE 1 TABLET BY MOUTH TWICE A DAY   finasteride 5 MG tablet Commonly known as: PROSCAR Take 5 mg by mouth Daily.   nitroGLYCERIN 0.4 MG SL tablet Commonly known as: NITROSTAT Place 1 tablet (0.4 mg total) under the tongue every 5 (five) minutes as needed for chest pain.   rifaximin 550 MG Tabs tablet Commonly known as: XIFAXAN Take 1 tablet (550 mg total) by mouth 3 (three) times daily for 14 days.   rosuvastatin 20  MG tablet Commonly known as: CRESTOR Take 1 tablet (20 mg total) by mouth daily.   spironolactone 25 MG tablet Commonly known as: ALDACTONE TAKE 1 TABLET BY MOUTH EVERY DAY   warfarin 5 MG tablet Commonly known as: COUMADIN Take as directed by the anticoagulation clinic. If you are unsure how to take this medication, talk to your nurse or doctor. Original instructions: TAKE 1/2 TABLET DAILY EXCEPT 1 TABLET ON SUNDAYS, TUESDAYS AND THURSDAYS OR AS DIRECTED       Allergies: No Known Allergies  Family History  Problem Relation Age of Onset  . Cancer Mother        lymphoma   . Heart attack  Father   . Heart disease Brother   . Colon cancer Neg Hx   . Colon polyps Neg Hx     Social History:  reports that he has never smoked. He has never used smokeless tobacco. He reports current alcohol use. He reports that he does not use drugs.  ROS: A complete review of systems was performed.  All systems are negative except for pertinent findings as noted.  Physical Exam:  Vital signs in last 24 hours: BP 96/66   Pulse 70   Temp 98.3 F (36.8 C)   Wt 221 lb (100.2 kg)   BMI 29.16 kg/m  Constitutional:  Alert and oriented, No acute distress Respiratory: Normal respiratory effort GI: Abdomen is soft, nontender, nondistended, no abdominal masses. No CVAT. No hernia. Genitourinary: Normal male phallus, testes are descended bilaterally and non-tender and without masses, scrotum is normal in appearance without lesions or masses, perineum is normal on inspection. Prostate feels about 40 grams. Neurologic: Grossly intact, no focal deficits Psychiatric: Normal mood and affect I have reviewed prior pt notes  I have reviewed notes from referring/previous physicians  I have reviewed urinalysis results  I have independently reviewed prior imaging--cysto/prostate U/S  I have reviewed prior PSA results    Impression/Assessment:  1. BPH - DRE shows that prostate is enlarged but otherwise  asymptomatic. Pt experiencing moderate BPH symptoms despite compliance with finasteride regimen. He is a good candidate for Urolift procedure.  Plan:  1. Pt continued on finasteride and reoriented to Urolift procedure.  2. Pt PCP has delegated PSA monitoring to [facility]. Pt PSA pending for today.  3. Pt will be scheduled for Urolift in January/February.  CC: Dr. Rory Percy

## 2020-08-04 NOTE — Progress Notes (Signed)
Urological Symptom Review  Patient is experiencing the following symptoms: Frequent urination Get up at night to urinate Erection problems (male only)   Review of Systems  Gastrointestinal (upper)  : Negative for upper GI symptoms  Gastrointestinal (lower) : Negative for lower GI symptoms  Constitutional : Negative for symptoms  Skin: Negative for skin symptoms  Eyes: Blurred vision  Ear/Nose/Throat : Negative for Ear/Nose/Throat symptoms  Hematologic/Lymphatic: Negative for Hematologic/Lymphatic symptoms  Cardiovascular : Negative for cardiovascular symptoms  Respiratory : Negative for respiratory symptoms  Endocrine: Negative for endocrine symptoms  Musculoskeletal: Negative for musculoskeletal symptoms  Neurological: Negative for neurological symptoms  Psychologic: Negative for psychiatric symptoms

## 2020-08-05 LAB — PSA: Prostate Specific Ag, Serum: 0.6 ng/mL (ref 0.0–4.0)

## 2020-08-06 NOTE — Progress Notes (Signed)
Sent via my chart and letter mailed

## 2020-08-10 ENCOUNTER — Ambulatory Visit (INDEPENDENT_AMBULATORY_CARE_PROVIDER_SITE_OTHER): Payer: BC Managed Care – PPO

## 2020-08-10 DIAGNOSIS — I5022 Chronic systolic (congestive) heart failure: Secondary | ICD-10-CM

## 2020-08-10 DIAGNOSIS — Z95810 Presence of automatic (implantable) cardiac defibrillator: Secondary | ICD-10-CM | POA: Diagnosis not present

## 2020-08-11 ENCOUNTER — Other Ambulatory Visit: Payer: Self-pay

## 2020-08-11 ENCOUNTER — Ambulatory Visit (INDEPENDENT_AMBULATORY_CARE_PROVIDER_SITE_OTHER): Payer: BC Managed Care – PPO | Admitting: *Deleted

## 2020-08-11 DIAGNOSIS — Z5181 Encounter for therapeutic drug level monitoring: Secondary | ICD-10-CM

## 2020-08-11 DIAGNOSIS — I4891 Unspecified atrial fibrillation: Secondary | ICD-10-CM

## 2020-08-11 DIAGNOSIS — I480 Paroxysmal atrial fibrillation: Secondary | ICD-10-CM

## 2020-08-11 LAB — POCT INR: INR: 2.8 (ref 2.0–3.0)

## 2020-08-11 NOTE — Patient Instructions (Signed)
Continue warfarin 1/2 tablet daily except 1 tablet on Sundays, Tuesdays and Thursdays.  Recheck in 4 weeks. Continue greens Pt has decided to stay on warfarin for now.

## 2020-08-11 NOTE — Progress Notes (Signed)
EPIC Encounter for ICM Monitoring  Patient Name: [name] is a 69 y.o. male Date: 08/11/2020 Primary Care Physican: Rory Percy, MD Primary Cardiologist:McDowell Electrophysiologist:Allred 08/04/2020 Weight: 221 lb baseline   Spoke with patient and reports feeling well at this time.  Denies fluid symptoms.  He reports diet unchanged and not sure why he may have the beginning of fluid accumulation.   He will be out of town for the holiday and weekend.  CorvueThoracic impedancesuggests possible fluid accumulation beginning on transmission date.  Prescribed:  Spironolactone 25 mg take 1 tablet daily.  Recommendations: He will restrict salt and fluid intake.  Encouraged to call for any fluid symptoms.   Follow-up plan: ICM clinic phone appointment on 09/14/2020. 91 day device clinic remote transmission1/17/2022.   EP/CardiologyOffice visits: 12/09/2020 withDr Domenic Polite. 06/25/2021 withDr Allred.   Copy of ICM check sent to Dr.Allred.   3 month ICM trend: 08/10/2020    1 Year ICM trend:       Rosalene Billings, RN 08/11/2020 3:26 PM

## 2020-08-19 ENCOUNTER — Other Ambulatory Visit: Payer: Self-pay | Admitting: Cardiology

## 2020-08-24 DIAGNOSIS — H26493 Other secondary cataract, bilateral: Secondary | ICD-10-CM | POA: Diagnosis not present

## 2020-08-24 DIAGNOSIS — H524 Presbyopia: Secondary | ICD-10-CM | POA: Diagnosis not present

## 2020-08-24 DIAGNOSIS — Z961 Presence of intraocular lens: Secondary | ICD-10-CM | POA: Diagnosis not present

## 2020-09-08 ENCOUNTER — Ambulatory Visit (INDEPENDENT_AMBULATORY_CARE_PROVIDER_SITE_OTHER): Payer: BC Managed Care – PPO | Admitting: *Deleted

## 2020-09-08 DIAGNOSIS — I4891 Unspecified atrial fibrillation: Secondary | ICD-10-CM | POA: Diagnosis not present

## 2020-09-08 DIAGNOSIS — I480 Paroxysmal atrial fibrillation: Secondary | ICD-10-CM | POA: Diagnosis not present

## 2020-09-08 DIAGNOSIS — Z5181 Encounter for therapeutic drug level monitoring: Secondary | ICD-10-CM

## 2020-09-08 LAB — POCT INR: INR: 2.3 (ref 2.0–3.0)

## 2020-09-08 NOTE — Patient Instructions (Signed)
Continue warfarin 1/2 tablet daily except 1 tablet on Sundays, Tuesdays and Thursdays.  Recheck in 6 weeks. Continue greens Pt has decided to stay on warfarin for now.

## 2020-09-14 NOTE — Progress Notes (Signed)
No ICM remote transmission received for 09/14/2020 and next ICM transmission scheduled for 09/22/2020.

## 2020-09-22 ENCOUNTER — Ambulatory Visit (INDEPENDENT_AMBULATORY_CARE_PROVIDER_SITE_OTHER): Payer: BC Managed Care – PPO

## 2020-09-22 DIAGNOSIS — I5022 Chronic systolic (congestive) heart failure: Secondary | ICD-10-CM

## 2020-09-22 DIAGNOSIS — Z95810 Presence of automatic (implantable) cardiac defibrillator: Secondary | ICD-10-CM | POA: Diagnosis not present

## 2020-09-25 NOTE — Progress Notes (Signed)
EPIC Encounter for ICM Monitoring  Patient Name: [name] is a 70 y.o. male Date: 09/25/2020 Primary Care Physican: Rory Percy, MD Primary Cardiologist:McDowell Electrophysiologist:Allred 09/25/2020 Weight: 220 lb baseline   Spoke with patient and reports feeling well at this time. Denies fluid symptoms.   He was on vacation during decreased impedance but feeling fine.  CorvueThoracic impedancesuggesting possible normal fluid levels.  Prescribed:  Spironolactone 25 mg take 1 tablet daily.  Recommendations: Encouraged to call for any fluid symptoms.   Follow-up plan: ICM clinic phone appointment on2/04/2021. 91 day device clinic remote transmission1/17/2022.   EP/CardiologyOffice visits: [facility]20[facility]. 10/7/2022withDr Allred.   Copy of ICM check sent to Dr.Allred.   3 month ICM trend: 09/22/2020.    1 Year ICM trend:       Rosalene Billings, RN 09/25/2020 11:37 AM

## 2020-10-05 ENCOUNTER — Ambulatory Visit (INDEPENDENT_AMBULATORY_CARE_PROVIDER_SITE_OTHER): Payer: BC Managed Care – PPO

## 2020-10-05 DIAGNOSIS — I255 Ischemic cardiomyopathy: Secondary | ICD-10-CM | POA: Diagnosis not present

## 2020-10-07 LAB — CUP PACEART REMOTE DEVICE CHECK
Battery Remaining Longevity: 71 mo
Battery Remaining Percentage: 70 %
Battery Voltage: 2.98 V
Brady Statistic RV Percent Paced: 1 %
Date Time Interrogation Session: 20220117040016
HighPow Impedance: 47 Ohm
HighPow Impedance: 47 Ohm
Implantable Lead Implant Date: 20020111
Implantable Lead Location: 753860
Implantable Lead Model: 148
Implantable Lead Serial Number: 116740
Implantable Pulse Generator Implant Date: 20180503
Lead Channel Impedance Value: 600 Ohm
Lead Channel Pacing Threshold Amplitude: 0.75 V
Lead Channel Pacing Threshold Pulse Width: 0.5 ms
Lead Channel Sensing Intrinsic Amplitude: 12 mV
Lead Channel Setting Pacing Amplitude: 2.5 V
Lead Channel Setting Pacing Pulse Width: 0.5 ms
Lead Channel Setting Sensing Sensitivity: 0.5 mV
Pulse Gen Serial Number: 7421148

## 2020-10-20 ENCOUNTER — Ambulatory Visit (INDEPENDENT_AMBULATORY_CARE_PROVIDER_SITE_OTHER): Payer: BC Managed Care – PPO | Admitting: *Deleted

## 2020-10-20 DIAGNOSIS — I480 Paroxysmal atrial fibrillation: Secondary | ICD-10-CM | POA: Diagnosis not present

## 2020-10-20 DIAGNOSIS — Z5181 Encounter for therapeutic drug level monitoring: Secondary | ICD-10-CM

## 2020-10-20 DIAGNOSIS — I4891 Unspecified atrial fibrillation: Secondary | ICD-10-CM | POA: Diagnosis not present

## 2020-10-20 LAB — POCT INR: INR: 3 (ref 2.0–3.0)

## 2020-10-20 NOTE — Progress Notes (Signed)
Remote ICD transmission.   

## 2020-10-20 NOTE — Patient Instructions (Signed)
Continue warfarin 1/2 tablet daily except 1 tablet on Sundays, Tuesdays and Thursdays.  Recheck in 6 weeks. Continue greens Pt has decided to stay on warfarin for now.

## 2020-10-27 ENCOUNTER — Ambulatory Visit (INDEPENDENT_AMBULATORY_CARE_PROVIDER_SITE_OTHER): Payer: BC Managed Care – PPO

## 2020-10-27 DIAGNOSIS — Z95810 Presence of automatic (implantable) cardiac defibrillator: Secondary | ICD-10-CM | POA: Diagnosis not present

## 2020-10-27 DIAGNOSIS — I5022 Chronic systolic (congestive) heart failure: Secondary | ICD-10-CM

## 2020-10-29 DIAGNOSIS — H26491 Other secondary cataract, right eye: Secondary | ICD-10-CM | POA: Diagnosis not present

## 2020-10-29 DIAGNOSIS — H01001 Unspecified blepharitis right upper eyelid: Secondary | ICD-10-CM | POA: Diagnosis not present

## 2020-10-29 DIAGNOSIS — H01002 Unspecified blepharitis right lower eyelid: Secondary | ICD-10-CM | POA: Diagnosis not present

## 2020-10-29 DIAGNOSIS — H01004 Unspecified blepharitis left upper eyelid: Secondary | ICD-10-CM | POA: Diagnosis not present

## 2020-11-03 NOTE — Progress Notes (Signed)
EPIC Encounter for ICM Monitoring  Patient Name: [name] is a 70 y.o. male Date: 11/03/2020 Primary Care Physican: Rory Percy, MD Primary Cardiologist:McDowell Electrophysiologist:Allred 09/25/2020 Weight: 220lb baseline   Spoke with patient and reports feeling well at this time. Denies fluid symptoms.    CorvueThoracic impedancesuggesting normal fluid levels.  Prescribed:  Spironolactone 25 mg take 1 tablet daily.  Recommendations:Encouraged to call for any fluid symptoms.   Follow-up plan: ICM clinic phone appointment on3/14/2022. 91 day device clinic remote transmission4/18/2022.   EP/CardiologyOffice visits: [facility]. 10/7/2022withDr Allred.   Copy of ICM check sent to Dr.Allred.    Direct Trend View through 11/02/2020   3 month ICM trend: 10/27/2020.    1 Year ICM trend:       Rosalene Billings, RN 11/03/2020 1:18 PM

## 2020-11-09 ENCOUNTER — Encounter: Payer: Self-pay | Admitting: Dermatology

## 2020-11-09 ENCOUNTER — Other Ambulatory Visit: Payer: Self-pay

## 2020-11-09 ENCOUNTER — Ambulatory Visit: Payer: BC Managed Care – PPO | Admitting: Dermatology

## 2020-11-09 DIAGNOSIS — D045 Carcinoma in situ of skin of trunk: Secondary | ICD-10-CM

## 2020-11-09 DIAGNOSIS — Z1283 Encounter for screening for malignant neoplasm of skin: Secondary | ICD-10-CM

## 2020-11-09 DIAGNOSIS — C4492 Squamous cell carcinoma of skin, unspecified: Secondary | ICD-10-CM

## 2020-11-09 DIAGNOSIS — C44622 Squamous cell carcinoma of skin of right upper limb, including shoulder: Secondary | ICD-10-CM

## 2020-11-09 DIAGNOSIS — L570 Actinic keratosis: Secondary | ICD-10-CM | POA: Diagnosis not present

## 2020-11-09 DIAGNOSIS — D485 Neoplasm of uncertain behavior of skin: Secondary | ICD-10-CM

## 2020-11-09 HISTORY — DX: Squamous cell carcinoma of skin, unspecified: C44.92

## 2020-11-09 NOTE — Patient Instructions (Signed)

## 2020-11-12 IMAGING — CT CT CHEST W/ CM
2 of 3 series · 15 of 36 positions shown, 18 images · IV contrast (Omni 300)
Comparison: Chest radiograph 11/16/2018

CLINICAL DATA: Evaluate pneumonia.

EXAM:
CT CHEST WITH CONTRAST
TECHNIQUE: Multidetector CT imaging of the chest was performed during
intravenous contrast administration.
CONTRAST:  75mL OMNIPAQUE IOHEXOL 300 MG/ML  SOLN

[Series 3: chest with 2mm st · axial · 0.98mm/px · z∈[+1136,+1444]mm · 12 of 182 slices shown, 15 images]
[im 14/182  mediastinal]
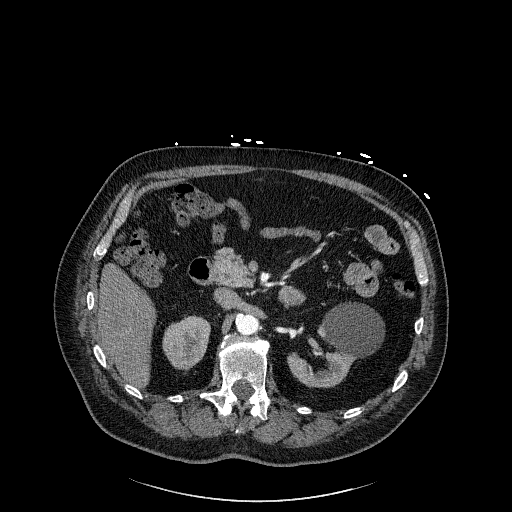
[im 14/182  lung]
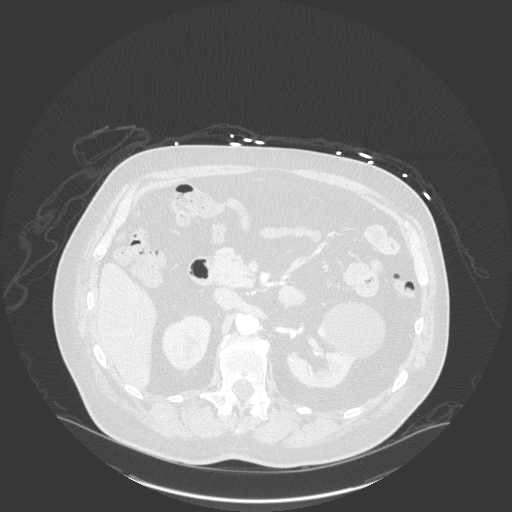
[im 27/182  lung]
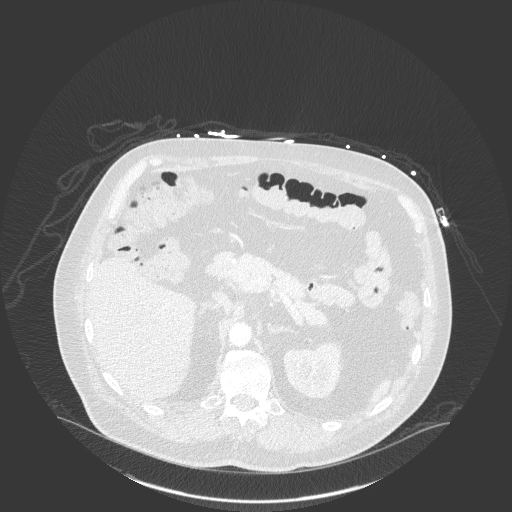
[im 41/182  lung]
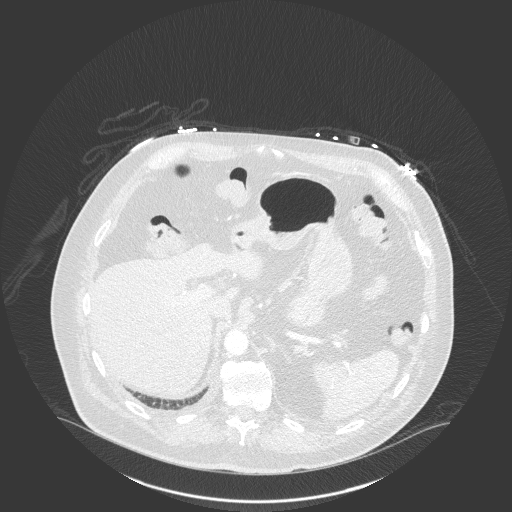
[im 54/182  lung]
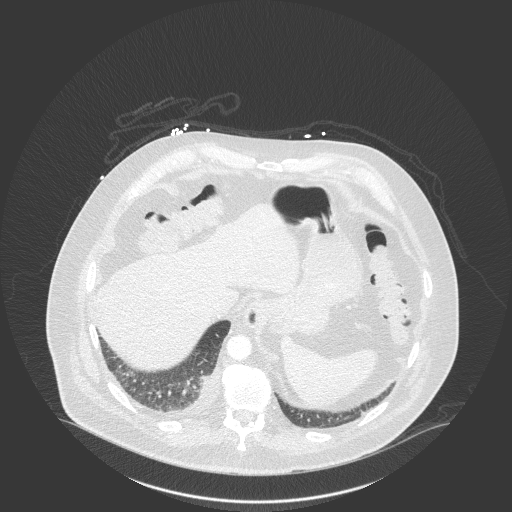
[im 68/182  mediastinal]
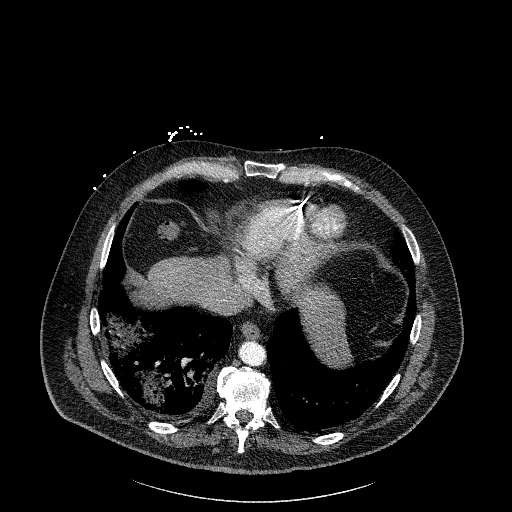
[im 68/182  lung]
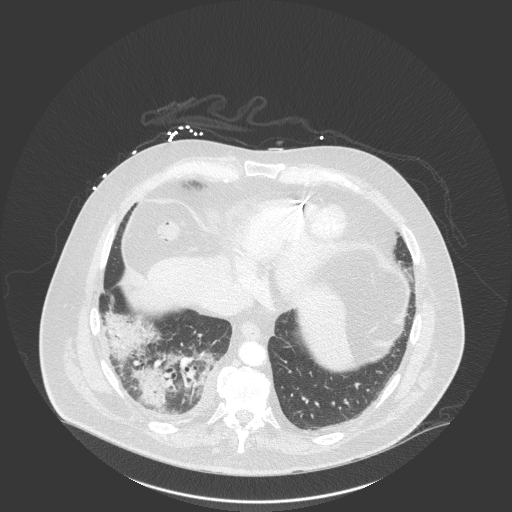
[im 81/182  lung]
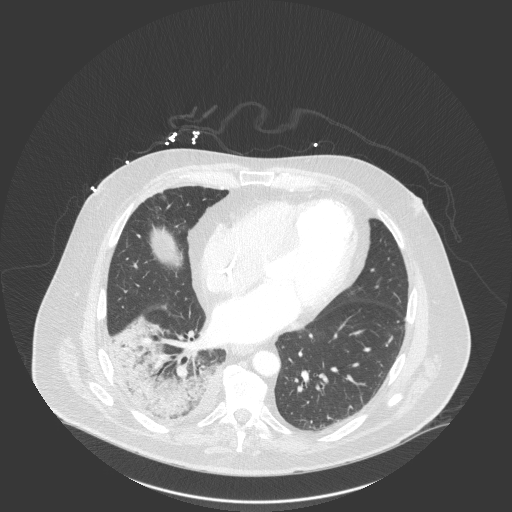
[im 101/182  lung]
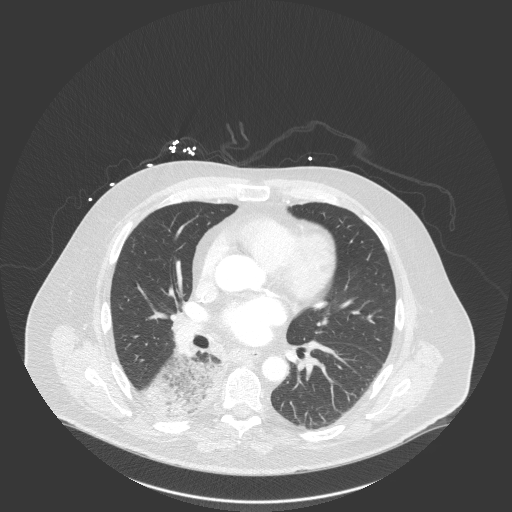
[im 114/182  lung]
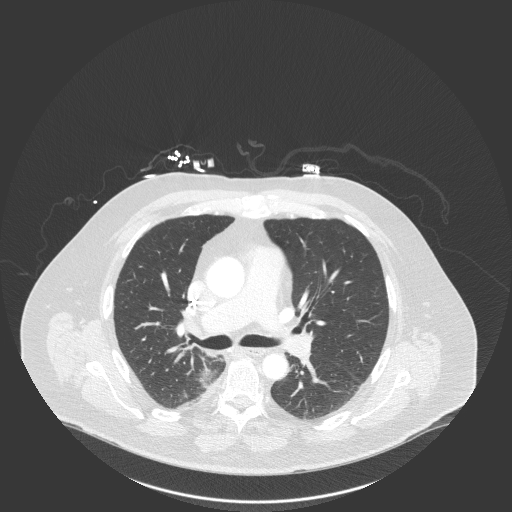
[im 128/182  mediastinal]
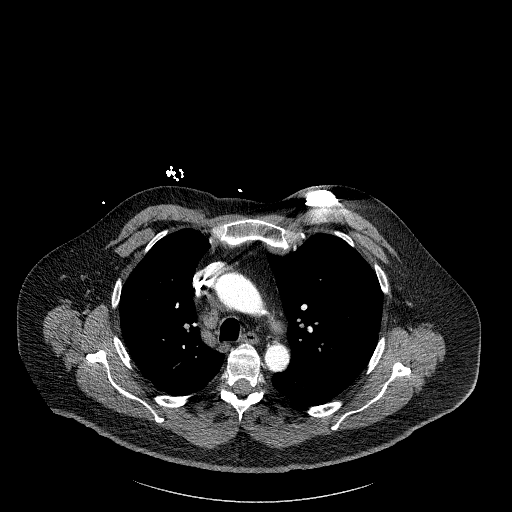
[im 128/182  lung]
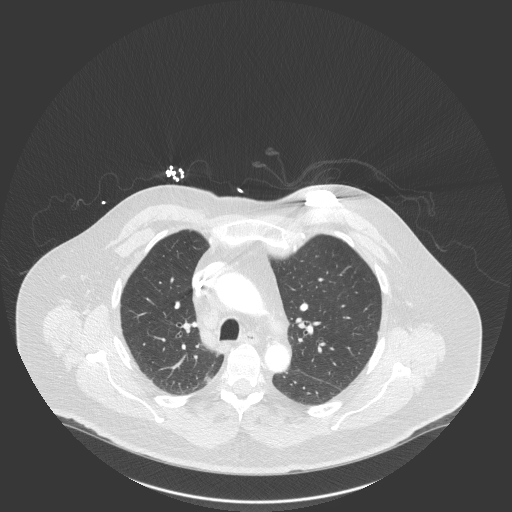
[im 141/182  lung]
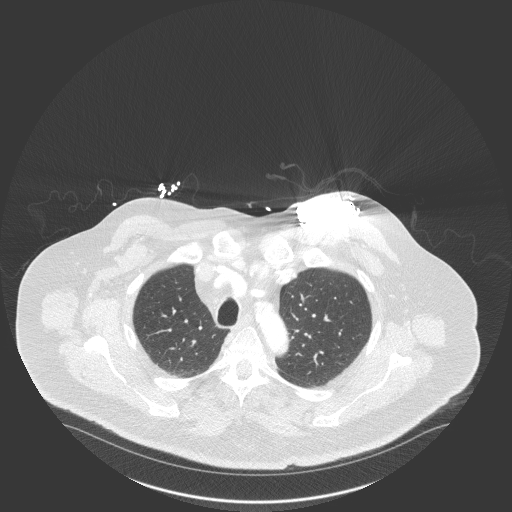
[im 155/182  lung]
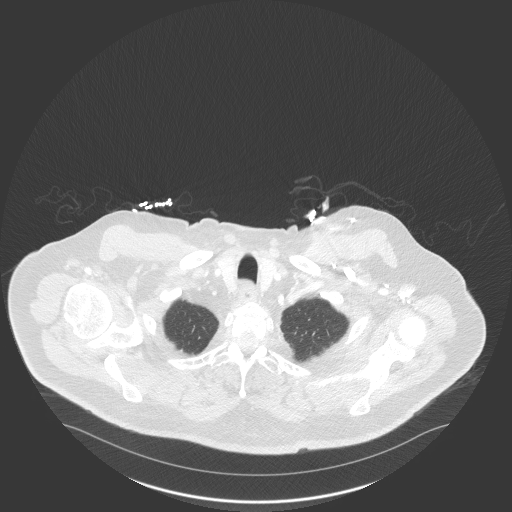
[im 168/182  lung]
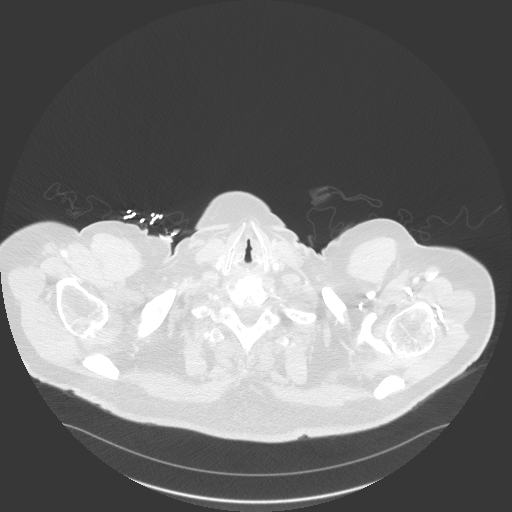

[Series 5: chest with 3mm st cor · coronal · 0.71mm/px · 3 of 111 slices shown]
[im 23/111  lung]
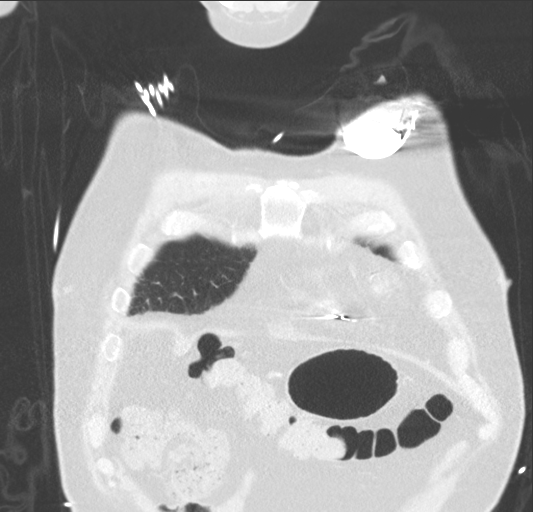
[im 45/111  lung]
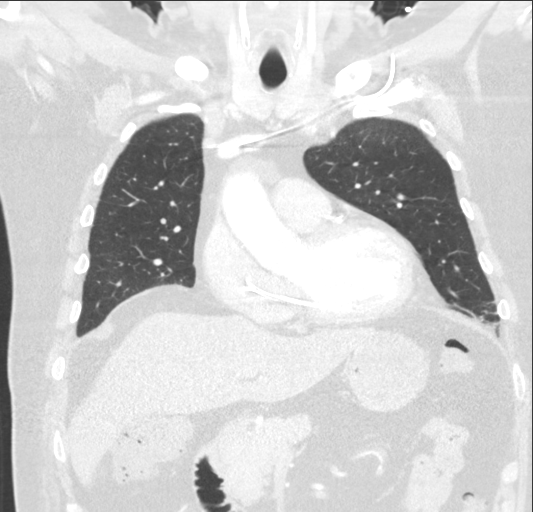
[im 67/111  lung]
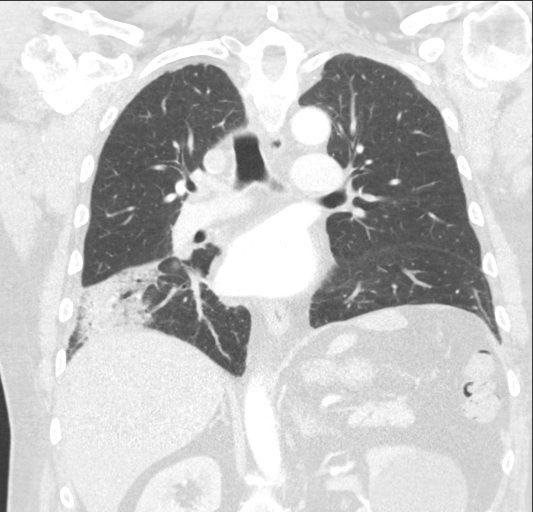

[15 of 36 positions shown; findings below may reference images not displayed]

FINDINGS: Cardiovascular: Left chest wall ICD is noted with lead in the right
ventricle. Normal heart size. No pericardial effusion. Aortic
atherosclerosis. Left main, LAD left circumflex and RCA coronary
artery calcifications.

Mediastinum/Nodes: Normal appearance of the thyroid gland. The
trachea appears patent and is midline. Small hiatal hernia. Right
paratracheal lymph node measures 1.2 cm. There is a subcarinal lymph
node which measures 1.5 cm, image 76/3. No hilar adenopathy.

Lungs/Pleura: No pleural effusion. There is diffuse airspace
consolidation and ground-glass attenuation throughout the right
lower lobe compatible with lobar pneumonia. Patchy area of
subsegmental atelectasis versus scar noted within the lingula.

Upper Abdomen: No acute abnormality identified within the abdomen.
Large left kidney cysts identified.

Musculoskeletal: Spondylosis identified. No acute or suspicious
osseous finding.
IMPRESSION: 1. Diffuse airspace consolidation and ground-glass attenuation
within the right lower lobe compatible with lobar pneumonia.
Follow-up imaging is advised to ensure complete resolution and to
rule out underlying malignancy.
2. Prominent common non pathologically enlarged mediastinal lymph
nodes are likely reactive.
3. Aortic atherosclerosis and multi vessel coronary artery
atherosclerotic calcifications.
4. Small hiatal hernia.

## 2020-11-16 ENCOUNTER — Encounter: Payer: Self-pay | Admitting: Dermatology

## 2020-11-17 NOTE — Progress Notes (Signed)
Follow-Up Visit   Subjective  [name] is a 70 y.o. male who presents for the following: Annual Exam (Patient here today for yearly skin check. Check place on right hand x over 1 year, check place under left eye and left shoulder x over a 1 year. No bleeding, and no pain from the spots.).  Several new crusts, growing on back and right hand. Location:  Duration:  Quality:  Associated Signs/Symptoms: Modifying Factors:  Severity:  Timing: Context: History of skin cancers  Objective  Well appearing patient in no apparent distress; mood and affect are within normal limits. Objective  Right Shoulder - Posterior: Waist up skin examination: no atypical moles or melanoma.  Possible nonmole skin cancers on back and hand will be biopsied and treated today.  Objective  Left Dorsal Hand, Left Preauricular Area, Right Dorsal Hand (3), Right Preauricular Area, Right Superior Helix: Multiple 2 to 5 mm of pink gritty crusts  Objective  Left Upper Back: 5 mm waxy pink crust     Objective  Right Dorsal Hand: Ill-defined 1.2 cm white-pink thick crust       A full examination was performed including scalp, head, eyes, ears, nose, lips, neck, chest, axillae, abdomen, back, buttocks, bilateral upper extremities, bilateral lower extremities, hands, feet, fingers, toes, fingernails, and toenails. All findings within normal limits unless otherwise noted below.   Assessment & Plan    Encounter for screening for malignant neoplasm of skin Right Shoulder - Posterior  Yearly skin check  AK (actinic keratosis) (7) Left Dorsal Hand; Right Dorsal Hand (3); Right Superior Helix; Left Preauricular Area; Right Preauricular Area  Destruction of lesion - Left Dorsal Hand, Left Preauricular Area, Right Dorsal Hand (3), Right Preauricular Area, Right Superior Helix Complexity: simple   Destruction method: cryotherapy   Informed consent: discussed and consent obtained   Timeout:   patient name, date of birth, surgical site, and procedure verified Lesion destroyed using liquid nitrogen: Yes   Cryotherapy cycles:  3 Outcome: patient tolerated procedure well with no complications    Carcinoma in situ of skin of trunk Left Upper Back  Skin / nail biopsy Type of biopsy: tangential   Informed consent: discussed and consent obtained   Timeout: patient name, date of birth, surgical site, and procedure verified   Procedure prep:  Patient was prepped and draped in usual sterile fashion (Non sterile) Prep type:  Chlorhexidine Anesthesia: the lesion was anesthetized in a standard fashion   Anesthetic:  1% lidocaine w/ epinephrine 1-100,000 local infiltration Instrument used: flexible razor blade   Outcome: patient tolerated procedure well   Post-procedure details: wound care instructions given    Destruction of lesion Complexity: simple   Destruction method: electrodesiccation and curettage   Informed consent: discussed and consent obtained   Timeout:  patient name, date of birth, surgical site, and procedure verified Anesthesia: the lesion was anesthetized in a standard fashion   Anesthetic:  1% lidocaine w/ epinephrine 1-100,000 local infiltration Curettage performed in three different directions: Yes   Curettage cycles:  3 Lesion length (cm):  1.1 Lesion width (cm):  1.1 Margin per side (cm):  0 Final wound size (cm):  1.1 Hemostasis achieved with:  aluminum chloride Outcome: patient tolerated procedure well with no complications   Post-procedure details: wound care instructions given    Specimen 1 - Surgical pathology Differential Diagnosis: bcc vs scc  Check Margins: No  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, including shoulder Right Dorsal Hand  Skin / nail biopsy Type of biopsy: tangential   Informed consent: discussed and consent obtained   Timeout: patient name, date of birth, surgical site, and procedure verified   Procedure prep:   Patient was prepped and draped in usual sterile fashion (Non sterile) Prep type:  Chlorhexidine Anesthesia: the lesion was anesthetized in a standard fashion   Anesthetic:  1% lidocaine w/ epinephrine 1-100,000 local infiltration Instrument used: flexible razor blade   Outcome: patient tolerated procedure well   Post-procedure details: wound care instructions given    Destruction of lesion Complexity: simple   Destruction method: electrodesiccation and curettage   Informed consent: discussed and consent obtained   Timeout:  patient name, date of birth, surgical site, and procedure verified Anesthesia: the lesion was anesthetized in a standard fashion   Anesthetic:  1% lidocaine w/ epinephrine 1-100,000 local infiltration Curettage performed in three different directions: Yes   Curettage cycles:  3 Lesion length (cm):  0.9 Lesion width (cm):  0.9 Margin per side (cm):  0 Final wound size (cm):  0.9 Hemostasis achieved with:  aluminum chloride Outcome: patient tolerated procedure well with no complications   Post-procedure details: wound care instructions given    Specimen 2 - Surgical pathology Differential Diagnosis: bcc vs scc  Check Margins: No      I, [name], MD, have reviewed all documentation for this visit.  The documentation on 11/17/20 for the exam, diagnosis, procedures, and orders are all accurate and complete.

## 2020-11-30 ENCOUNTER — Ambulatory Visit (INDEPENDENT_AMBULATORY_CARE_PROVIDER_SITE_OTHER): Payer: BC Managed Care – PPO

## 2020-11-30 DIAGNOSIS — Z95810 Presence of automatic (implantable) cardiac defibrillator: Secondary | ICD-10-CM

## 2020-11-30 DIAGNOSIS — I5022 Chronic systolic (congestive) heart failure: Secondary | ICD-10-CM

## 2020-12-01 ENCOUNTER — Telehealth: Payer: Self-pay

## 2020-12-01 NOTE — Telephone Encounter (Signed)
Remote ICM transmission received.  Attempted call to patient regarding ICM remote transmission and left detailed message per DPR.  Advised to return call for any fluid symptoms or questions. Next ICM remote transmission scheduled 4//19/2022.

## 2020-12-01 NOTE — Progress Notes (Signed)
EPIC Encounter for ICM Monitoring  Patient Name: ZACKARIAH VANDERPOL is a 70 y.o. male Date: 12/01/2020 Primary Care Physican: Rory Percy, MD Primary Cardiologist:McDowell Electrophysiologist:Allred 1/7/2022Weight: 220lb baseline   Attempted call to patient and unable to reach.  Left detailed message per DPR regarding transmission. Transmission reviewed.   CorvueThoracic impedancesuggesting normal fluid levels.  Prescribed:  Spironolactone 25 mg take 1 tablet daily.  Recommendations:Left voice mail with ICM number and encouraged to call if experiencing any fluid symptoms.   Follow-up plan: ICM clinic phone appointment on4/19/2022. 91 day device clinic remote transmission4/18/2022.   EP/CardiologyOffice visits: 3/[facility]2022[facility]Dr [facility]. 10/7/2022withDr Allred.   Copy of ICM check sent to Dr.Allred.   3 month ICM trend: 11/30/2020.    1 Year ICM trend:       Rosalene Billings, RN 12/01/2020 10:18 AM

## 2020-12-02 ENCOUNTER — Telehealth: Payer: Self-pay | Admitting: Dermatology

## 2020-12-02 ENCOUNTER — Encounter: Payer: Self-pay | Admitting: Dermatology

## 2020-12-02 NOTE — Telephone Encounter (Signed)
Left message for patient to return our phone call.

## 2020-12-02 NOTE — Telephone Encounter (Signed)
Patient is calling for pathology results from last visit with Stuart Tafeen, MD 

## 2020-12-08 NOTE — Progress Notes (Signed)
Cardiology Office Note  Date: 12/09/2020   ID: [name], DOB June 11, 1951, MRN [id number]  PCP:  Rory Percy, MD  Cardiologist:  Rozann Lesches, MD Electrophysiologist:  Thompson Grayer, MD   Chief Complaint  Patient presents with  . Cardiac follow-up    History of Present Illness: [name] is a 70 y.o. male last seen in September 2021.  He presents for a follow-up visit, continues to do well reporting NYHA class II dyspnea, no angina symptoms or palpitations.  He does not report any orthopnea or progressive leg swelling.  He follows with [name], St. Jude ICD in place.  Recent thoracic impedance was normal.  No device shocks or syncope.  He continues on Coumadin with follow-up in the anticoagulation clinic.  Due for follow-up PT/INR tomorrow.  I talked with him again about considering a switch to Eliquis.  I reviewed his medications which are listed below.  He will be having lab work next month and follow-up with Dr. [name] at [place].  I mentioned to him the possibility of considering [name] as in addition to his heart failure regimen for further risk reduction.  He does not have an active diagnosis of type 2 diabetes mellitus.  I personally reviewed his ECG today which shows rate controlled atrial fibrillation with leftward axis and low voltage, old anterior infarct pattern.  We also discussed referral back to see Dr. [name] for follow-up of OSA.  He has not been able to tolerate CPAP.  Past Medical History:  Diagnosis Date  . AICD (automatic cardioverter/defibrillator) present 2006   ICD  . Anterior myocardial infarction ([place])   . BCC (basal cell carcinoma) superficial 11/22/2016   Left back sholder  . Chronic systolic heart failure ([place])   . Coronary atherosclerosis of native coronary artery    a. s/p BMS to LAD and OM1 b. patent stents by cath in 2013  . History of hiatal hernia   . Hypertension   . Inducible ventricular tachycardia ([place])   .  Ischemic cardiomyopathy    LVEF 30%, status post ICD  . OSA (obstructive sleep apnea)    unable to tolerate CPAP  . Paroxysmal atrial fibrillation ([facility])    On coumadin  . Pseudoaneurysm ([place])   . SCC (squamous cell carcinoma) 08/04/2006   right shoulder (CX35FU)  . SCC (squamous cell carcinoma) well differentiated 12/13/2011   right temple  . 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 Squamous cell carcinoma of skin 11/09/2020   in situ- left upper back  . Squamous cell carcinoma of skin 11/09/2020   KA-right dorsal hand  . Stroke [facility])     Past Surgical History:  Procedure Laterality Date  . AICD implantation  2011   St Jude ICD implanted for primary prevention of sudden death  . BIOPSY  05-18-20   Procedure: BIOPSY;  Surgeon: [name], MD;  Location: [facility];  Service: Endoscopy;;  . CATARACT EXTRACTION W/PHACO Left 03/27/2017   Procedure: CATARACT EXTRACTION PHACO AND INTRAOCULAR LENS PLACEMENT (Between);  Surgeon: [name], MD;  Location: [facility];  Service: Ophthalmology;  Laterality: Left;  CDE: 8.48  . CATARACT EXTRACTION W/PHACO Right 05/29/2017   Procedure: CATARACT EXTRACTION PHACO AND INTRAOCULAR LENS PLACEMENT (IOC);  Surgeon: [name], MD;  Location: [facility];  Service: Ophthalmology;  Laterality: Right;  CDE: 7.48  . COLONOSCOPY N/A May 18, 2020   Scattered medium-mouthed diverticula in sigmoid, three sessile polyps in hepatic flexure and  cecum. 5-7 mm in size. Tubular adenomas. Benign colonic mucosa. 3 year surveillance.   [name] EYE SURGERY    . ICD GENERATOR CHANGEOUT N/A 01/19/2017   SJM [place] VR ICD generator change by Dr [name]  . LAPAROSCOPY N/A 10/23/2017   Procedure: LAPAROSCOPY DIAGNOSTIC;  Surgeon: [name], MD;  Location: [place];  Service: General;  Laterality: N/A;  . LEFT HEART CATH AND CORONARY ANGIOGRAPHY N/A 06/21/2018   Procedure: LEFT HEART CATH AND CORONARY ANGIOGRAPHY;  Surgeon: [name], MD;  Location:  [facility];  Service: Cardiovascular;  Laterality: N/A;  . POLYPECTOMY  04/21/2020   Procedure: POLYPECTOMY;  Surgeon: [name], MD;  Location: [facility];  Service: Endoscopy;;  . VENTRAL HERNIA REPAIR N/A 10/23/2017   Procedure: [place];  Surgeon: [name], MD;  Location: [facility];  Service: General;  Laterality: N/A;    Current Outpatient Medications  Medication Sig Dispense Refill  . aspirin 81 MG tablet Take 81 mg by mouth daily.    . bisoprolol (ZEBETA) 5 MG tablet TAKE 1 TABLET BY MOUTH EVERY DAY (Patient taking differently: Take 5 mg by mouth daily. TAKE 1 TABLET BY MOUTH EVERY DAY) 90 tablet 2  . ENTRESTO 24-26 MG TAKE 1 TABLET BY MOUTH TWICE A DAY 180 tablet 2  . finasteride (PROSCAR) 5 MG tablet Take 1 tablet (5 mg total) by mouth daily. 90 tablet 3  . nitroGLYCERIN (NITROSTAT) 0.4 MG SL tablet Place 1 tablet (0.4 mg total) under the tongue every 5 (five) minutes as needed for chest pain. 25 tablet 3  . rosuvastatin (CRESTOR) 20 MG tablet Take 1 tablet (20 mg total) by mouth daily. 90 tablet 3  . spironolactone (ALDACTONE) 25 MG tablet TAKE 1 TABLET BY MOUTH EVERY DAY 90 tablet 1  . warfarin (COUMADIN) 5 MG tablet TAKE 1/2 TABLET DAILY EXCEPT 1 TABLET ON SUNDAYS, TUESDAYS AND THURSDAYS OR AS DIRECTED 90 tablet 2  . Wheat Dextrin (BENEFIBER ON THE GO PO) Take by mouth daily.     No current facility-administered medications for this visit.   Allergies:  Patient has no known allergies.   ROS:  No syncope.  Nocturia, still considering UroLift procedure with urology.  Physical Exam: VS:  BP 132/76 (BP Location: Right Arm, Patient Position: Sitting, Cuff Size: Normal)   Pulse 61   Wt 229 lb 6.4 oz (104.1 kg)   SpO2 97%   BMI 30.27 kg/m , BMI Body mass index is 30.27 kg/m.  Wt Readings from Last 3 Encounters:  12/09/20 229 lb 6.4 oz (104.1 kg)  08/04/20 221 lb (100.2 kg)  07/28/20 229 lb (103.9 kg)    General: Patient appears comfortable  at rest. HEENT: Conjunctiva and lids normal, wearing a mask. Neck: Supple, no elevated JVP or carotid bruits, no thyromegaly. Lungs: Clear to auscultation, nonlabored breathing at rest. Cardiac: Regular rate and rhythm, no S3 or significant systolic murmur, no pericardial rub. Extremities: No pitting edema.  ECG:  An ECG dated 12/02/2019 was personally reviewed today and demonstrated:  Atrial fibrillation with PVC, nonspecific T wave changes.  Recent Labwork:  No interval lab work for review today.  Other Studies Reviewed Today:  Echocardiogram 07/15/2020: 1. Left ventricular ejection fraction, by estimation, is approximately  35%. The left ventricle has moderately decreased function. The left  ventricle demonstrates regional wall motion abnormalities (see scoring  diagram/findings for description). Left  ventricular diastolic parameters are indeterminate. No obvious LV mural  thrombus.  2. Normal  RV-RA gradient of 20 mmHg. Right ventricular systolic function  is normal. The right ventricular size is normal. A device wire is  visualized.  3. Left atrial size was severely dilated.  4. The mitral valve is grossly normal. Mild to moderate mitral valve  regurgitation.  5. Tricuspid valve regurgitation is mild to moderate.  6. The aortic valve is tricuspid. Aortic valve regurgitation is trivial.  Mild aortic valve sclerosis is present, with no evidence of aortic valve  stenosis.  7. Unable to estimate CVP.  Assessment and Plan:  1.  Ischemic cardiomyopathy with chronic systolic heart failure.  LVEF approximately 35% by last assessment and RV contraction normal.  He is symptomatically stable, recent thoracic impedance normal by ICD interrogation.  Continue Zebeta, Entresto, and Aldactone.  Would also be a good candidate for initiation of Farxiga for additional risk reduction, encouraged him to follow-up with lab work per PCP as scheduled however prior to making any further  adjustments.  2.  Persistent atrial fibrillation, CHA2DS2-VASc score is 6.  He remains on Coumadin with follow-up in the anticoagulation clinic.  I have encouraged him to consider a switch to Eliquis.  3.  CAD status post previous anterior wall myocardial infarction.  We have kept him on low-dose aspirin along with statin therapy.  No active angina symptoms.  4.  OSA, unable to tolerate CPAP.  We will refer him back to see Dr. [name].  Medication Adjustments/Labs and Tests Ordered: Current medicines are reviewed at length with the patient today.  Concerns regarding medicines are outlined above.   Tests Ordered: Orders Placed This Encounter  Procedures  . Ambulatory referral to Cardiology  . EKG 12-Lead    Medication Changes: No orders of the defined types were placed in this encounter.   Disposition:  Follow up 6 months in the [facility] office.  Signed, Satira Sark, MD, [facility] 12/09/2020 9:17 AM    [facility] [address], [place], [place] [address] Phone: [phone]; Fax: [phone]

## 2020-12-09 ENCOUNTER — Encounter: Payer: Self-pay | Admitting: Cardiology

## 2020-12-09 ENCOUNTER — Ambulatory Visit (INDEPENDENT_AMBULATORY_CARE_PROVIDER_SITE_OTHER): Payer: BC Managed Care – PPO | Admitting: Cardiology

## 2020-12-09 VITALS — BP 132/76 | HR 61 | Wt 229.4 lb

## 2020-12-09 DIAGNOSIS — I25119 Atherosclerotic heart disease of native coronary artery with unspecified angina pectoris: Secondary | ICD-10-CM | POA: Diagnosis not present

## 2020-12-09 DIAGNOSIS — G4733 Obstructive sleep apnea (adult) (pediatric): Secondary | ICD-10-CM

## 2020-12-09 DIAGNOSIS — I4819 Other persistent atrial fibrillation: Secondary | ICD-10-CM

## 2020-12-09 DIAGNOSIS — I255 Ischemic cardiomyopathy: Secondary | ICD-10-CM | POA: Diagnosis not present

## 2020-12-09 NOTE — Patient Instructions (Addendum)
Medication Instructions:   Your physician recommends that you continue on your current medications as directed. Please refer to the Current Medication list given to you today.  Labwork:  none  Testing/Procedures:  none  Follow-Up:  Your physician recommends that you schedule a follow-up appointment in: 6 months.  You have been referred to Dr. [name]  Any Other Special Instructions Will Be Listed Below (If Applicable).  If you need a refill on your cardiac medications before your next appointment, please call your pharmacy.

## 2020-12-10 ENCOUNTER — Ambulatory Visit (INDEPENDENT_AMBULATORY_CARE_PROVIDER_SITE_OTHER): Payer: BC Managed Care – PPO | Admitting: Pharmacist

## 2020-12-10 DIAGNOSIS — I480 Paroxysmal atrial fibrillation: Secondary | ICD-10-CM

## 2020-12-10 DIAGNOSIS — Z5181 Encounter for therapeutic drug level monitoring: Secondary | ICD-10-CM | POA: Diagnosis not present

## 2020-12-10 LAB — POCT INR: INR: 4.3 — AB (ref 2.0–3.0)

## 2020-12-10 NOTE — Patient Instructions (Addendum)
Description   Hold dose today and tomorrow then continue warfarin 1/2 tablet daily except 1 tablet on Sundays, Tuesdays and Thursdays.  Recheck in 2 weeks. Continue greens Pt has decided to stay on warfarin for now.

## 2020-12-14 ENCOUNTER — Other Ambulatory Visit: Payer: Self-pay | Admitting: *Deleted

## 2020-12-14 MED ORDER — BISOPROLOL FUMARATE 5 MG PO TABS: 5.0000 mg | ORAL_TABLET | Freq: Every day | ORAL | 2 refills | Status: DC

## 2020-12-24 ENCOUNTER — Ambulatory Visit (INDEPENDENT_AMBULATORY_CARE_PROVIDER_SITE_OTHER): Payer: BC Managed Care – PPO | Admitting: *Deleted

## 2020-12-24 ENCOUNTER — Other Ambulatory Visit: Payer: Self-pay

## 2020-12-24 DIAGNOSIS — I480 Paroxysmal atrial fibrillation: Secondary | ICD-10-CM

## 2020-12-24 DIAGNOSIS — Z5181 Encounter for therapeutic drug level monitoring: Secondary | ICD-10-CM | POA: Diagnosis not present

## 2020-12-24 LAB — POCT INR: INR: 2.7 (ref 2.0–3.0)

## 2020-12-24 NOTE — Patient Instructions (Signed)
Continue warfarin 1/2 tablet daily except 1 tablet on Sundays, Tuesdays and Thursdays.  Recheck in 4 weeks. Continue greens Pt has decided to stay on warfarin for now.

## 2021-01-04 ENCOUNTER — Other Ambulatory Visit: Payer: Self-pay | Admitting: Cardiology

## 2021-01-04 ENCOUNTER — Telehealth: Payer: Self-pay

## 2021-01-04 ENCOUNTER — Ambulatory Visit (INDEPENDENT_AMBULATORY_CARE_PROVIDER_SITE_OTHER): Payer: BC Managed Care – PPO

## 2021-01-04 DIAGNOSIS — I255 Ischemic cardiomyopathy: Secondary | ICD-10-CM

## 2021-01-04 NOTE — Telephone Encounter (Signed)
PA Request for Entresto 24-26 mg tablets approved through Cover My Meds    PA Case ID: [id number] - Rx #: [id number] Approved 01/04/2021-01/04/2022

## 2021-01-05 ENCOUNTER — Ambulatory Visit (INDEPENDENT_AMBULATORY_CARE_PROVIDER_SITE_OTHER): Payer: BC Managed Care – PPO

## 2021-01-05 DIAGNOSIS — Z95810 Presence of automatic (implantable) cardiac defibrillator: Secondary | ICD-10-CM | POA: Diagnosis not present

## 2021-01-05 DIAGNOSIS — I5022 Chronic systolic (congestive) heart failure: Secondary | ICD-10-CM | POA: Diagnosis not present

## 2021-01-06 LAB — CUP PACEART REMOTE DEVICE CHECK
Battery Remaining Longevity: 70 mo
Battery Remaining Percentage: 68 %
Battery Voltage: 2.96 V
Brady Statistic RV Percent Paced: 1 %
Date Time Interrogation Session: 20220418094058
HighPow Impedance: 45 Ohm
HighPow Impedance: 45 Ohm
Implantable Lead Implant Date: 20020111
Implantable Lead Location: 753860
Implantable Lead Model: 148
Implantable Lead Serial Number: 116740
Implantable Pulse Generator Implant Date: 20180503
Lead Channel Impedance Value: 580 Ohm
Lead Channel Pacing Threshold Amplitude: 0.75 V
Lead Channel Pacing Threshold Pulse Width: 0.5 ms
Lead Channel Sensing Intrinsic Amplitude: 12 mV
Lead Channel Setting Pacing Amplitude: 2.5 V
Lead Channel Setting Pacing Pulse Width: 0.5 ms
Lead Channel Setting Sensing Sensitivity: 0.5 mV
Pulse Gen Serial Number: 7421148

## 2021-01-08 NOTE — Progress Notes (Signed)
EPIC Encounter for ICM Monitoring  Patient Name: [name] is a 70 y.o. male Date: 01/08/2021 Primary Care Physican: Rory Percy, MD Primary Cardiologist:McDowell Electrophysiologist:Allred 12/09/2020 Office Weight: 229lb   Spoke with patient and reports feeling well at this time.  Denies fluid symptoms.    CorvueThoracic impedancesuggesting normal fluid levels.  Prescribed:  Spironolactone 25 mg take 1 tablet daily.  Recommendations:No changes and encouraged to call if experiencing any fluid symptoms.  Follow-up plan: ICM clinic phone appointment on5/31/2022. 91 day device clinic remote transmission7/18/2022.   EP/CardiologyOffice visits: 9/19/2022withDr Domenic Polite. 10/7/2022withDr Allred.   Copy of ICM check sent to Dr.Allred.  3 month ICM trend: 01/05/2021.    1 Year ICM trend:       Rosalene Billings, RN 01/08/2021 11:56 AM

## 2021-01-18 ENCOUNTER — Ambulatory Visit: Payer: BC Managed Care – PPO | Admitting: Cardiovascular Disease

## 2021-01-20 DIAGNOSIS — E7800 Pure hypercholesterolemia, unspecified: Secondary | ICD-10-CM | POA: Diagnosis not present

## 2021-01-20 DIAGNOSIS — R944 Abnormal results of kidney function studies: Secondary | ICD-10-CM | POA: Diagnosis not present

## 2021-01-20 DIAGNOSIS — R739 Hyperglycemia, unspecified: Secondary | ICD-10-CM | POA: Diagnosis not present

## 2021-01-20 DIAGNOSIS — N400 Enlarged prostate without lower urinary tract symptoms: Secondary | ICD-10-CM | POA: Diagnosis not present

## 2021-01-21 ENCOUNTER — Ambulatory Visit (INDEPENDENT_AMBULATORY_CARE_PROVIDER_SITE_OTHER): Payer: BC Managed Care – PPO | Admitting: *Deleted

## 2021-01-21 DIAGNOSIS — Z5181 Encounter for therapeutic drug level monitoring: Secondary | ICD-10-CM

## 2021-01-21 DIAGNOSIS — I480 Paroxysmal atrial fibrillation: Secondary | ICD-10-CM | POA: Diagnosis not present

## 2021-01-21 LAB — POCT INR: INR: 2.7 (ref 2.0–3.0)

## 2021-01-21 NOTE — Patient Instructions (Signed)
Continue warfarin 1/2 tablet daily except 1 tablet on Sundays, Tuesdays and Thursdays.  Recheck in 4 weeks. Continue greens Pt has decided to stay on warfarin for now.

## 2021-01-21 NOTE — Progress Notes (Signed)
Remote ICD transmission.   

## 2021-01-22 DIAGNOSIS — E7801 Familial hypercholesterolemia: Secondary | ICD-10-CM | POA: Diagnosis not present

## 2021-01-22 DIAGNOSIS — I2584 Coronary atherosclerosis due to calcified coronary lesion: Secondary | ICD-10-CM | POA: Diagnosis not present

## 2021-01-22 DIAGNOSIS — I5020 Unspecified systolic (congestive) heart failure: Secondary | ICD-10-CM | POA: Diagnosis not present

## 2021-01-22 DIAGNOSIS — R351 Nocturia: Secondary | ICD-10-CM | POA: Diagnosis not present

## 2021-01-23 DIAGNOSIS — R059 Cough, unspecified: Secondary | ICD-10-CM | POA: Diagnosis not present

## 2021-01-23 DIAGNOSIS — Z20822 Contact with and (suspected) exposure to covid-19: Secondary | ICD-10-CM | POA: Diagnosis not present

## 2021-02-09 ENCOUNTER — Ambulatory Visit: Payer: BC Managed Care – PPO | Admitting: Gastroenterology

## 2021-02-10 ENCOUNTER — Ambulatory Visit: Payer: BC Managed Care – PPO | Admitting: Cardiovascular Disease

## 2021-02-16 ENCOUNTER — Ambulatory Visit (INDEPENDENT_AMBULATORY_CARE_PROVIDER_SITE_OTHER): Payer: BC Managed Care – PPO

## 2021-02-16 DIAGNOSIS — Z95810 Presence of automatic (implantable) cardiac defibrillator: Secondary | ICD-10-CM | POA: Diagnosis not present

## 2021-02-16 DIAGNOSIS — I5022 Chronic systolic (congestive) heart failure: Secondary | ICD-10-CM

## 2021-02-17 NOTE — Progress Notes (Signed)
EPIC Encounter for ICM Monitoring  Patient Name: [name] is a 70 y.o. male Date: 02/17/2021 Primary Care Physican: Rory Percy, MD Primary Cardiologist:McDowell Electrophysiologist:Allred 02/17/2021 Weight: 222lb   Spoke with patient and reports feeling well at this time. Heart failure questions reviewed. Pt asymptomatic for fluid.   CorvueThoracic impedancesuggesting normal fluid levels.   Prescribed:  Spironolactone 25 mg take 1 tablet daily.  Recommendations:No changes and encouraged to call if experiencing any fluid symptoms.  Follow-up plan: ICM clinic phone appointment on7/01/2021. 91 day device clinic remote transmission7/18/2022.   EP/CardiologyOffice visits: 9/19/2022withDr Domenic Polite. 10/7/2022withDr Allred.   Copy of ICM check sent to Dr.Allred.  3 month ICM trend: 02/15/2021.     Rosalene Billings, RN 02/17/2021 10:20 AM

## 2021-02-18 ENCOUNTER — Ambulatory Visit (INDEPENDENT_AMBULATORY_CARE_PROVIDER_SITE_OTHER): Payer: BC Managed Care – PPO | Admitting: *Deleted

## 2021-02-18 DIAGNOSIS — I4891 Unspecified atrial fibrillation: Secondary | ICD-10-CM

## 2021-02-18 DIAGNOSIS — I480 Paroxysmal atrial fibrillation: Secondary | ICD-10-CM | POA: Diagnosis not present

## 2021-02-18 DIAGNOSIS — Z5181 Encounter for therapeutic drug level monitoring: Secondary | ICD-10-CM

## 2021-02-18 LAB — POCT INR: INR: 3.7 — AB (ref 2.0–3.0)

## 2021-02-18 NOTE — Patient Instructions (Signed)
Hold warfarin tonight then resume 1/2 tablet daily except 1 tablet on Sundays, Tuesdays and Thursdays.  Recheck in 4 weeks. Continue greens Pt has decided to stay on warfarin for now.

## 2021-03-01 ENCOUNTER — Other Ambulatory Visit: Payer: Self-pay | Admitting: Cardiology

## 2021-03-11 ENCOUNTER — Other Ambulatory Visit: Payer: Self-pay

## 2021-03-11 ENCOUNTER — Ambulatory Visit (INDEPENDENT_AMBULATORY_CARE_PROVIDER_SITE_OTHER): Payer: BC Managed Care – PPO | Admitting: *Deleted

## 2021-03-11 DIAGNOSIS — I4891 Unspecified atrial fibrillation: Secondary | ICD-10-CM

## 2021-03-11 DIAGNOSIS — Z5181 Encounter for therapeutic drug level monitoring: Secondary | ICD-10-CM

## 2021-03-11 DIAGNOSIS — I480 Paroxysmal atrial fibrillation: Secondary | ICD-10-CM

## 2021-03-11 LAB — POCT INR: INR: 3.1 — AB (ref 2.0–3.0)

## 2021-03-11 NOTE — Patient Instructions (Signed)
Take warfarin 1/2 tonight then resume 1/2 tablet daily except 1 tablet on Sundays, Tuesdays and Thursdays.  Recheck in 4 weeks. Continue greens Pt has decided to stay on warfarin for now.

## 2021-03-12 ENCOUNTER — Other Ambulatory Visit: Payer: Self-pay | Admitting: Cardiology

## 2021-03-23 ENCOUNTER — Ambulatory Visit (INDEPENDENT_AMBULATORY_CARE_PROVIDER_SITE_OTHER): Payer: BC Managed Care – PPO

## 2021-03-23 ENCOUNTER — Other Ambulatory Visit: Payer: Self-pay

## 2021-03-23 ENCOUNTER — Encounter: Payer: Self-pay | Admitting: Cardiovascular Disease

## 2021-03-23 ENCOUNTER — Ambulatory Visit: Payer: BC Managed Care – PPO | Admitting: Cardiovascular Disease

## 2021-03-23 VITALS — BP 122/60 | HR 79 | Ht 73.0 in | Wt 230.0 lb

## 2021-03-23 DIAGNOSIS — E782 Mixed hyperlipidemia: Secondary | ICD-10-CM | POA: Diagnosis not present

## 2021-03-23 DIAGNOSIS — I4819 Other persistent atrial fibrillation: Secondary | ICD-10-CM | POA: Diagnosis not present

## 2021-03-23 DIAGNOSIS — G4731 Primary central sleep apnea: Secondary | ICD-10-CM

## 2021-03-23 DIAGNOSIS — I5022 Chronic systolic (congestive) heart failure: Secondary | ICD-10-CM | POA: Diagnosis not present

## 2021-03-23 DIAGNOSIS — I255 Ischemic cardiomyopathy: Secondary | ICD-10-CM

## 2021-03-23 DIAGNOSIS — Z95810 Presence of automatic (implantable) cardiac defibrillator: Secondary | ICD-10-CM

## 2021-03-23 NOTE — Patient Instructions (Signed)
Medication Instructions:  No changes *If you need a refill on your cardiac medications before your next appointment, please call your pharmacy*   Lab Work: No labs If you have labs (blood work) drawn today and your tests are completely normal, you will receive your results only by: [place] (if you have MyChart) OR A paper copy in the mail If you have any lab test that is abnormal or we need to change your treatment, we will call you to review the results.   Testing/Procedures: No Testing   Follow-Up: At [facility], you and your health needs are our priority.  As part of our continuing mission to provide you with exceptional heart care, we have created designated Provider Care Teams.  These Care Teams include your primary Cardiologist (physician) and Advanced Practice Providers (APPs -  Physician Assistants and Nurse Practitioners) who all work together to provide you with the care you need, when you need it.  We recommend signing up for the patient portal called "MyChart".  Sign up information is provided on this After Visit Summary.  MyChart is used to connect with patients for Virtual Visits (Telemedicine).  Patients are able to view lab/test results, encounter notes, upcoming appointments, etc.  Non-urgent messages can be sent to your provider as well.   To learn more about what you can do with MyChart, go to [email or web address].    Your next appointment:   2-3  month(s)  The format for your next appointment:   In Person  Provider:   Shelva Majestic, MD

## 2021-03-24 ENCOUNTER — Telehealth: Payer: Self-pay

## 2021-03-24 NOTE — Telephone Encounter (Signed)
Remote ICM transmission received.  Attempted call to patient regarding ICM remote transmission and left detailed message per DPR.  Advised to return call for any fluid symptoms or questions. Next ICM remote transmission scheduled 04/26/2021.

## 2021-03-24 NOTE — Progress Notes (Signed)
EPIC Encounter for ICM Monitoring  Patient Name: [name] is a 70 y.o. male Date: 03/24/2021 Primary Care Physican: Rory Percy, MD Primary Cardiologist: Domenic Polite Electrophysiologist: Allred 02/17/2021 Weight: 222 lb     Attempted call to patient and unable to reach.  Left detailed message per DPR regarding transmission. Transmission reviewed.    Corvue Thoracic impedance suggesting normal fluid levels.   Prescribed:  Spironolactone 25 mg take 1 tablet daily.   Recommendations:  Left voice mail with ICM number and encouraged to call if experiencing any fluid symptoms.   Follow-up plan: ICM clinic phone appointment on 04/26/2021.   91 day device clinic remote transmission 04/05/2021.       EP/Cardiology Office visits:    06/25/2021 with Dr [name].   Copy of ICM check sent to [name].    3 month ICM trend: 03/24/2021.    1 Year ICM trend:       Rosalene Billings, RN 03/24/2021 1:20 PM

## 2021-03-29 ENCOUNTER — Encounter: Payer: Self-pay | Admitting: Cardiovascular Disease

## 2021-03-29 NOTE — Progress Notes (Signed)
Cardiology Office Note    Date:  03/29/2021   ID:  [name], DOB 11-01-1950, MRN [id number]  PCP:  Marcus Percy, MD  Cardiologist:  Marcus Majestic, MD (sleep); Dr. Lillia Smith  Reestablishment of sleep care   History of Present Illness:  [name] is a 70 y.o. male who presents to the office for sleep evaluation.  I last saw him in October 2018.  [name] has a history of an ischemic cardiomyopathy, known CAD, is status post ICD therapy for reduced LV function.  An echo Doppler study in April 2018 showed an EF of 25 to 30%.  He reportedly had undergone a prior sleep evaluation at [facility] in 2014.  In 2018 he was found to have severe obstructive sleep apnea on reevaluation with an AHI of 68.7/h.  On his titration study he was transitioned to BiPAP therapy and was only titrated to a pressure of 12/6.  His AHI remains significantly elevated at 31.7/h and he also had moderate central apnea index of 8.5.  On the diagnostic portion of his study had significant oxygen desaturation to nadir 72% and there was evidence for loud snoring.  His ECG showed PVCs.  BiPAP auto therapy was recommended with a minimum EPAP pressure of 9 with pressure support of 5.  I saw him for initial sleep evaluation in August 2018.  At that time he had felt significantly improved since initiating BiPAP therapy but a download continue to show moderate events with an AHI of 19.8 and a central apnea index of 13.2.  He subsequently was transitioned to a ResMed air curve 10 ST auto unit and I saw him in follow-up on July 17, 2017.  At the time he was using nasal pillow mask.  He was having significant mask leak.  I have not seen him since 2018.  He was stopped using therapy approximately 3 years ago.  He is followed by Dr. Domenic Smith for his primary cardiology care and Dr. Rayann Smith for his EP.  When he was most recently seen by Dr. Domenic Smith it was recommended that he see me back for reassessment of his  complex sleep apnea and reinitiation of treatment.  Presently, he does not sleep well.  He admits to snoring, nocturia, and frequent awakenings.  Epworth Sleepiness Scale score was calculated in the office today and this endorsed at 7 arguing against significant excessive daytime sleepiness.   Past Medical History:  Diagnosis Date   AICD (automatic cardioverter/defibrillator) present 2006   ICD   Anterior myocardial infarction ([place])    BCC (basal cell carcinoma) superficial 11/22/2016   Left back sholder   Chronic systolic heart failure ([place])    Coronary atherosclerosis of native coronary artery    a. s/p BMS to LAD and OM1 b. 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[place])    Ischemic cardiomyopathy    LVEF 30%, status post ICD   OSA (obstructive sleep apnea)    unable to tolerate CPAP   Paroxysmal atrial fibrillation (HCC)    On coumadin   Pseudoaneurysm (HCC)    SCC (squamous cell carcinoma) 08/04/2006   right shoulder ([id number])   SCC (squamous cell carcinoma) well differentiated 12/13/2011   right temple   Squamous cell carcinoma in situ (SCCIS) 08/04/2006   right sholder   Squamous cell carcinoma of skin 11/09/2020   in situ- left upper back   Squamous  cell carcinoma of skin 11/09/2020   KA-right dorsal hand   Stroke [facility])     Past Surgical History:  Procedure Laterality Date   AICD implantation  2011   St Jude ICD implanted for primary prevention of sudden death   BIOPSY  05/21/2020   Procedure: BIOPSY;  Surgeon: [name], MD;  Location: [facility];  Service: Endoscopy;;   CATARACT EXTRACTION W/PHACO Left 03/27/2017   Procedure: CATARACT EXTRACTION PHACO AND INTRAOCULAR LENS PLACEMENT ([place]);  Surgeon: [name], MD;  Location: [facility] ORS;  Service: Ophthalmology;  Laterality: Left;  CDE: 8.48   CATARACT EXTRACTION W/PHACO Right 05/29/2017   Procedure: CATARACT EXTRACTION PHACO AND INTRAOCULAR  LENS PLACEMENT ([facility]);  Surgeon: [name], MD;  Location: [facility] ORS;  Service: Ophthalmology;  Laterality: Right;  CDE: 7.48   COLONOSCOPY N/A 2020/05/21   Scattered medium-mouthed diverticula in sigmoid, three sessile polyps in hepatic flexure and cecum. 5-7 mm in size. Tubular adenomas. Benign colonic mucosa. 3 year surveillance.    EYE SURGERY     ICD GENERATOR CHANGEOUT N/A 01/19/2017   SJM [place] VR ICD generator change by Dr [name]   LAPAROSCOPY N/A 10/23/2017   Procedure: LAPAROSCOPY DIAGNOSTIC;  Surgeon: [name], MD;  Location: [place];  Service: General;  Laterality: N/A;   LEFT HEART CATH AND CORONARY ANGIOGRAPHY N/A 06/21/2018   Procedure: LEFT HEART CATH AND CORONARY ANGIOGRAPHY;  Surgeon: [name], MD;  Location: [place] CV LAB;  Service: Cardiovascular;  Laterality: N/A;   POLYPECTOMY  05-21-20   Procedure: POLYPECTOMY;  Surgeon: [name], MD;  [facility];  Service: Endoscopy;;   VENTRAL HERNIA REPAIR N/A 10/23/2017   Procedure: VENTRAL HERNIA REPAIR;  Surgeon: [name], MD;  Location: [place];  Service: General;  Laterality: N/A;    Current Medications: Outpatient Medications Prior to Visit  Medication Sig Dispense Refill   aspirin 81 MG tablet Take 81 mg by mouth daily.     bisoprolol (ZEBETA) 5 MG tablet Take 1 tablet (5 mg total) by mouth daily. 90 tablet 2   ENTRESTO 24-26 MG TAKE 1 TABLET BY MOUTH TWICE A DAY 180 tablet 2   finasteride (PROSCAR) 5 MG tablet Take 1 tablet (5 mg total) by mouth daily. 90 tablet 3   nitroGLYCERIN (NITROSTAT) 0.4 MG SL tablet Place 1 tablet (0.4 mg total) under the tongue every 5 (five) minutes as needed for chest pain. 25 tablet 3   rosuvastatin (CRESTOR) 20 MG tablet Take 1 tablet (20 mg total) by mouth daily. 90 tablet 3   spironolactone (ALDACTONE) 25 MG tablet TAKE 1 TABLET BY MOUTH EVERY DAY 90 tablet 2   warfarin (COUMADIN) 5 MG tablet TAKE 1/2 TABLET DAILY EXCEPT 1 TABLET ON SUNDAYS, TUESDAYS  AND THURSDAYS OR AS DIRECTED 90 tablet 0   Wheat Dextrin (BENEFIBER ON THE GO PO) Take by mouth daily.     No facility-administered medications prior to visit.     Allergies:   Patient has no known allergies.   Social History   Socioeconomic History   Marital status: Married    Spouse name: Not on file   Number of children: Not on file   Years of education: Not on file   Highest education level: Not on file  Occupational History   Occupation: retired    Comment: Banking  Tobacco Use   Smoking status: Never   Smokeless tobacco: Never  Vaping Use   Vaping Use: Never used  Substance and  Sexual Activity   Alcohol use: Yes    Alcohol/week: 0.0 standard drinks    Comment: occasional   Drug use: No   Sexual activity: Not on file  Other Topics Concern   Not on file  Social History Narrative   Not on file   Social Determinants of Health   Financial Resource Strain: Not on file  Food Insecurity: Not on file  Transportation Needs: Not on file  Physical Activity: Not on file  Stress: Not on file  Social Connections: Not on file    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  He had worked at [employer] and drank which was then brought by [employer].  He has 6 grandchildren.  Family History:  The patient's family history includes Cancer in his mother; Heart attack in his father; Heart disease in his brother.   ROS General: Negative; No fevers, chills, or night sweats;  HEENT: Negative; No changes in vision or hearing, sinus congestion, difficulty swallowing Pulmonary: Negative; No cough, wheezing, shortness of breath, hemoptysis Cardiovascular: Negative; No chest pain, presyncope, syncope, palpitations GI: Negative; No nausea, vomiting, diarrhea, or abdominal pain GU: Negative; No dysuria, hematuria, or difficulty voiding Musculoskeletal: Negative; no myalgias, joint pain, or weakness Hematologic/Oncology: Negative; no easy bruising, bleeding Endocrine: Negative;  no heat/cold intolerance; no diabetes Neuro: Negative; no changes in balance, headaches Skin: Negative; No rashes or skin lesions Psychiatric: Negative; No behavioral problems, depression Sleep: Complex severe sleep apnea sleep apnea with  snoring and previous documented significant nocturnal hypoxemia and nocturia; no daytime sleepiness, hypersomnolence, bruxism, restless legs, hypnogognic hallucinations, no cataplexy Other comprehensive 14 point system review is negative.   PHYSICAL EXAM:   VS:  BP 122/60   Pulse 79   Ht '6\' 1"'  (1.854 m)   Wt 230 lb (104.3 kg)   SpO2 96%   BMI 30.34 kg/m     Repeat blood pressure by me was 98/60 supine and 90/60 standing  Wt Readings from Last 3 Encounters:  03/23/21 230 lb (104.3 kg)  12/09/20 229 lb 6.4 oz (104.1 kg)  08/04/20 221 lb (100.2 kg)    General: Alert, oriented, no distress.  Skin: normal turgor, no rashes, warm and dry HEENT: Normocephalic, atraumatic. Pupils equal round and reactive to light; sclera anicteric; extraocular muscles intact;  Nose without nasal septal hypertrophy Mouth/Parynx benign; Mallinpatti scale 3/4 Neck: No JVD, no carotid bruits; normal carotid upstroke Lungs: clear to ausculatation and percussion; no wheezing or rales Chest wall: without tenderness to palpitation Heart: PMI not displaced, RRR, s1 s2 normal, 1/6 systolic murmur, no diastolic murmur, no rubs, gallops, thrills, or heaves Abdomen: soft, nontender; no hepatosplenomehaly, BS+; abdominal aorta nontender and not dilated by palpation. Back: no CVA tenderness Pulses 2+ Musculoskeletal: full range of motion, normal strength, no joint deformities Extremities: no clubbing cyanosis or edema, Homan's sign negative  Neurologic: grossly nonfocal; Cranial nerves grossly wnl Psychologic: Normal mood and affect   Studies/Labs Reviewed:   EKG:  EKG is no ordered today.  I personally reviewed his last ECG from December 09, 2020 which demonstrates atrial  fibrillation at 71 bpm, QS complex V1 through V4 and Q waves in lead III consistent with prior anterior wall myocardial infarction.  Recent Labs: BMP Latest Ref Rng & Units 12/02/2019 11/28/2018 11/16/2018  Glucose 65 - 99 mg/dL 103(H) 99 107(H)  BUN 8 - 27 mg/dL '20 20 14  ' Creatinine 0.76 - 1.27 mg/dL 1.26 1.21 1.21  BUN/Creat Ratio 10 - 24 16 - -  Sodium 134 - 144 mmol/L 139 134(L) 130(L)  Potassium 3.5 - 5.2 mmol/L 4.7 4.4 4.4  Chloride 96 - 106 mmol/L 102 100 98  CO2 20 - 29 mmol/L '23 28 23  ' Calcium 8.6 - 10.2 mg/dL 9.5 9.3 9.1     Hepatic Function Latest Ref Rng & Units 11/16/2018 06/19/2018  Total Protein 6.5 - 8.1 g/dL 7.2 7.1  Albumin 3.5 - 5.0 g/dL 3.0(L) 3.8  AST 15 - 41 U/L 33 19  ALT 0 - 44 U/L 48(H) 18  Alk Phosphatase 38 - 126 U/L 98 47  Total Bilirubin 0.3 - 1.2 mg/dL 1.0 1.4(H)    CBC Latest Ref Rng & Units 11/28/2018 11/16/2018 06/20/2018  WBC 4.0 - 10.5 K/uL 7.1 9.0 11.0(H)  Hemoglobin 13.0 - 17.0 g/dL 15.4 14.6 15.0  Hematocrit 39.0 - 52.0 % 46.0 45.4 46.2  Platelets 150.0 - 400.0 K/uL 304.0 300 131(L)   Lab Results  Component Value Date   MCV 93.9 11/28/2018   MCV 94.8 11/16/2018   MCV 96.5 06/20/2018   Lab Results  Component Value Date   TSH 0.564 06/19/2018   No results found for: HGBA1C   BNP    Component Value Date/Time   BNP 362.7 (H) 06/19/2018 0325    ProBNP    Component Value Date/Time   PROBNP 817 (H) 12/02/2019 1022   PROBNP 209.0 (H) 11/28/2018 1215     Lipid Panel  No results found for: CHOL, TRIG, HDL, CHOLHDL, VLDL, LDLCALC, LDLDIRECT, LABVLDL   RADIOLOGY: No results found.   Additional studies/ records that were reviewed today include:    01/20/2017 CLINICAL INFORMATION The patient is referred for a split night study with BPAP.   MEDICATIONS aspirin 81 MG tablet bisoprolol (ZEBETA) 5 MG tablet calcium carbonate (TUMS - DOSED IN MG ELEMENTAL CALCIUM) 500 MG chewable tablet finasteride (PROSCAR) 5 MG tablet losartan  (COZAAR) 50 MG tablet nitroGLYCERIN (NITROSTAT) 0.4 MG SL tablet omega-3 acid ethyl esters (LOVAZA) 1 G capsule rosuvastatin (CRESTOR) 20 MG tablet spironolactone (ALDACTONE) 25 MG tablet warfarin (COUMADIN) 5 MG tablet   Medications self-administered by patient taken the night of the study : N/A   SLEEP STUDY TECHNIQUE As per the AASM Manual for the Scoring of Sleep and Associated Events v2.3 (April 2016) with a hypopnea requiring 4% desaturations.   The channels recorded and monitored were frontal, central and occipital EEG, electrooculogram (EOG), submentalis EMG (chin), nasal and oral airflow, thoracic and abdominal wall motion, anterior tibialis EMG, snore microphone, electrocardiogram, and pulse oximetry. Bi-level positive airway pressure (BiPAP) was initiated when the patient met split night criteria and was titrated according to treat sleep-disordered breathing.   RESPIRATORY PARAMETERS   Diagnostic Total AHI (/hr):            68.7     RDI (/hr):         71.2     OA Index (/hr):            24.9     CA Index (/hr):      8.5 REM AHI (/hr):            53.3     NREM AHI (/hr):          69.8     Supine AHI (/hr):         69.2     Non-supine AHI (/hr):        66.67 Min O2 Sat (%):  72.00   Mean O2 (%):  92.21   Time below 88% (min):           14.0                    Titration Optimal IPAP Pressure (cm):             Optimal EPAP Pressure (cm):                        AHI at Optimal Pressure (/hr):          N/A      Min O2 at Optimal Pressure (%):       84.00 Sleep % at Optimal (%):         N/A      Supine % at Optimal (%):       N/A                                            SLEEP ARCHITECTURE The study was initiated at 9:49:40 PM and terminated at 5:03:38 AM. The total recorded time was 434.0 minutes. EEG confirmed total sleep time was 329.0 minutes yielding a sleep efficiency of 75.8%. Sleep onset after lights out was 33.3 minutes with a REM latency of 150.0 minutes. The patient  spent 10.03% of the night in stage N1 sleep, 74.47% in stage N2 sleep, 0.00% in stage N3 and 15.50% in REM. Wake after sleep onset (WASO) was 71.7 minutes. The Arousal Index was 39.9/hour.   LEG MOVEMENT DATA The total Periodic Limb Movements of Sleep (PLMS) were 0. The PLMS index was 0.00 .   CARDIAC DATA The 2 lead EKG demonstrated sinus rhythm. The mean heart rate was 54.91 beats per minute. Other EKG findings include: PVCs.   IMPRESSIONS - Severe obstructive sleep apnea occurred during the diagnostic portion of the study (AHI = 68.7 /hour). An optimal BPAP pressure could not be selected for this patient based on the available study data. The patient was only titrated up to a BiPAP pressure of 12/6 and AHI remained elevated at 31.7/h with an oxygen nadir at 89%. - Moderate central sleep apnea occurred during the diagnostic portion of the study (CAI = 8.5/hour). - Moderate oxygen desaturation was noted during the diagnostic portion of the study to a nadir of 83% during NREM and 72% during REM sleep. - The patient snored with Loud snoring volume during the diagnostic portion of the study. - EKG findings include PVCs. - Clinically significant periodic limb movements of sleep did not occur during the study.   DIAGNOSIS - Obstructive Sleep Apnea (327.23 [G47.33 ICD-10])   RECOMMENDATIONS - Recommend an initial trial of BiPAP Auto with and EPAP min of 7, PS of 5 and IPAP max of 25 with heated humidification. - Effort should be made to optimize nasal and oral pharyngeal patency. - Effort should be made to optimize the patient's volume status in this patient with a significant cardiomyopathy and central apneic events. - Avoid alcohol, sedatives and other CNS depressants that may worsen sleep apnea and disrupt normal sleep architecture. - Sleep hygiene should be reviewed to assess factors that may improve sleep quality. - Weight management and regular exercise should be initiated or continued. -  Recommend a download be obtained in 2-4 weeks to assess initial and subsequent efficacy and plan for sleep  clinic evaluation after 4 weeks of therapy.   ASSESSMENT:    1. Complex sleep apnea syndrome   2. Persistent atrial fibrillation (HCC)   3. Cardiomyopathy, ischemic   4. Mixed hyperlipidemia   5. 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  He has a history of an ischemic cardiomyopathy, CAD, and underwent ICD implantation for markedly reduced LV function.  An echo Doppler study in April 2018 showed an EF of 25 to 30%.  He has a history of permanent atrial fibrillation and is followed by Dr. Johnny Bridge for primary cardiology care and Dr. Thompson Grayer for EP.  Most recently, he has been on a medical regimen consisting of Entresto 24/26 mg twice a day, bisoprolol 5 mg daily, spironolactone 25 mg daily and warfarin for anticoagulation of his atrial fibrillation.  He is on rosuvastatin 20 mg for hyperlipidemia and takes finasteride for prostate issues.  He has a history of obstructive sleep apnea that was originally diagnosed at [facility] in 2014.  In 2018 a follow-up study confirmed severe obstructive sleep apnea as well as central apneic events.  He initially was started on BiPAP therapy and was transitioned BiPAP auto ST.  Remotely, he had used nasal pillow mask and had significant leak.  He apparently stopped using CPAP therapy for over the last 3 years.  He is uncertain who his DME company was which may be delaying he was eating or previously advanced home care.  I had a lengthy discussion with him in the office today.  He has significant cardiovascular comorbidities including permanent atrial fibrillation in addition to his reduced LV function.  His last echo Doppler study in October 2021 showed slight improvement in his ejection fraction of 35%.  There was mild to moderate  mitral and tricuspid regurgitation.  In the office today, he provided him with a new ResMed N 30i mask and fit him for this.  This hopefully will reduce his previous significant leak.  We will need to contact his DME company for additional supplies as needed.  I had a lengthy discussion with him regarding adverse consequences of untreated sleep apnea on his cardiovascular health.  Previously he was found to have severe oxygen desaturation to a nadir of 72%.  We discussed untreated sleep apnea effects on blood pressure, potential nocturnal hypoxemia contributing to arrhythmia.  He already has permanent atrial fibrillation.  I also discussed potential for nocturnal ischemia in the setting of nocturnal hypoxemia due to significant apneic or hypopneic events.  In addition, its effects on glucose, inflammation, and GERD were discussed.  I reinforced to him ideal sleep duration is 7 to 8 hours if at all possible and he should use his BiPAP auto ST for the nights duration particularly since sleep apnea is typically worse during REM sleep and the preponderance of REM sleep occurs in the second half of the night.  His blood pressure today is on the low side and for this reason he is only on low-dose Entresto 24/26 mg twice a day.  He appears euvolemic on exam and there are no signs of edema or overt CHF.  Ventricular rate is controlled with bisoprolol 5 mg.  He is anticoagulated on warfarin and continues to be on low-dose aspirin with his CAD and prior anterior wall MI.  He continues to be on rosuvastatin for hyperlipidemia with target LDL less than 70.  I will  see him in 2 to 3 months for follow-up evaluation or sooner as needed.   Medication Adjustments/Labs and Tests Ordered: Current medicines are reviewed at length with the patient today.  Concerns regarding medicines are outlined above.  Medication changes, Labs and Tests ordered today are listed in the Patient Instructions below. Patient Instructions  Medication  Instructions:  No changes *If you need a refill on your cardiac medications before your next appointment, please call your pharmacy*   Lab Work: No labs If you have labs (blood work) drawn today and your tests are completely normal, you will receive your results only by: [place] (if you have MyChart) OR A paper copy in the mail If you have any lab test that is abnormal or we need to change your treatment, we will call you to review the results.   Testing/Procedures: No Testing   Follow-Up: At [facility], you and your health needs are our priority.  As part of our continuing mission to provide you with exceptional heart care, we have created designated Provider Care Teams.  These Care Teams include your primary Cardiologist (physician) and Advanced Practice Providers (APPs -  Physician Assistants and Nurse Practitioners) who all work together to provide you with the care you need, when you need it.  We recommend signing up for the patient portal called "MyChart".  Sign up information is provided on this After Visit Summary.  MyChart is used to connect with patients for Virtual Visits (Telemedicine).  Patients are able to view lab/test results, encounter notes, upcoming appointments, etc.  Non-urgent messages can be sent to your provider as well.   To learn more about what you can do with MyChart, go to [email or web address].    Your next appointment:   2-3  month(s)  The format for your next appointment:   In Person  Provider:   Shelva Majestic, MD       Signed, Marcus Majestic, MD  03/29/2021 5:07 PM    [facility] [address] [place], [place], [place]  [address] Phone: [phone]

## 2021-04-05 ENCOUNTER — Ambulatory Visit (INDEPENDENT_AMBULATORY_CARE_PROVIDER_SITE_OTHER): Payer: BC Managed Care – PPO

## 2021-04-05 DIAGNOSIS — I5022 Chronic systolic (congestive) heart failure: Secondary | ICD-10-CM

## 2021-04-06 LAB — CUP PACEART REMOTE DEVICE CHECK
Battery Remaining Longevity: 67 mo
Battery Remaining Percentage: 66 %
Battery Voltage: 2.96 V
Brady Statistic RV Percent Paced: 1 %
Date Time Interrogation Session: 20220718041420
HighPow Impedance: 44 Ohm
HighPow Impedance: 44 Ohm
Implantable Lead Implant Date: 20020111
Implantable Lead Location: 753860
Implantable Lead Model: 148
Implantable Lead Serial Number: 116740
Implantable Pulse Generator Implant Date: 20180503
Lead Channel Impedance Value: 580 Ohm
Lead Channel Pacing Threshold Amplitude: 0.75 V
Lead Channel Pacing Threshold Pulse Width: 0.5 ms
Lead Channel Sensing Intrinsic Amplitude: 12 mV
Lead Channel Setting Pacing Amplitude: 2.5 V
Lead Channel Setting Pacing Pulse Width: 0.5 ms
Lead Channel Setting Sensing Sensitivity: 0.5 mV
Pulse Gen Serial Number: 7421148

## 2021-04-08 ENCOUNTER — Ambulatory Visit (INDEPENDENT_AMBULATORY_CARE_PROVIDER_SITE_OTHER): Payer: BC Managed Care – PPO | Admitting: *Deleted

## 2021-04-08 DIAGNOSIS — Z5181 Encounter for therapeutic drug level monitoring: Secondary | ICD-10-CM

## 2021-04-08 DIAGNOSIS — I480 Paroxysmal atrial fibrillation: Secondary | ICD-10-CM | POA: Diagnosis not present

## 2021-04-08 LAB — POCT INR: INR: 3 (ref 2.0–3.0)

## 2021-04-08 NOTE — Patient Instructions (Signed)
Continue warfarin 1/2 tablet daily except 1 tablet on Sundays, Tuesdays and Thursdays.  Recheck in 4 weeks. Continue greens Pt has decided to stay on warfarin for now.

## 2021-04-26 ENCOUNTER — Ambulatory Visit (INDEPENDENT_AMBULATORY_CARE_PROVIDER_SITE_OTHER): Payer: BC Managed Care – PPO

## 2021-04-26 DIAGNOSIS — Z95810 Presence of automatic (implantable) cardiac defibrillator: Secondary | ICD-10-CM | POA: Diagnosis not present

## 2021-04-26 DIAGNOSIS — I5022 Chronic systolic (congestive) heart failure: Secondary | ICD-10-CM

## 2021-04-28 NOTE — Progress Notes (Signed)
Remote ICD transmission.   

## 2021-04-28 NOTE — Progress Notes (Signed)
EPIC Encounter for ICM Monitoring  Patient Name: [name] is a 70 y.o. male Date: 04/28/2021 Primary Care Physican: Rory Percy, MD Primary Cardiologist: Domenic Polite Electrophysiologist: Allred 02/17/2021 Weight: 222 lb     Spoke with patient and heart failure questions reviewed.  Pt asymptomatic for fluid accumulation and feeling well.  He reports traveling this past weekend and eating foods that were not good for him.  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   [place] Thoracic impedance suggesting normal fluid levels.  [place]medance starting downward trend on day of transmission.    Prescribed:  Spironolactone 25 mg take 1 tablet daily.   Recommendations:  Recommendation to limit salt intake to 2000 mg daily and fluid intake to 64 oz daily.  Encouraged to call if experiencing any fluid symptoms.    Follow-up plan: ICM clinic phone appointment on 05/31/2021.   91 day device clinic remote transmission 07/05/2021.       EP/Cardiology Office visits:    06/25/2021 with Dr [name].   Copy of ICM check sent to [name].   3 month ICM trend: 04/27/2021.    1 Year ICM trend:       Rosalene Billings, RN 04/28/2021 3:55 PM

## 2021-05-10 DIAGNOSIS — H0288A Meibomian gland dysfunction right eye, upper and lower eyelids: Secondary | ICD-10-CM | POA: Diagnosis not present

## 2021-05-10 DIAGNOSIS — Z961 Presence of intraocular lens: Secondary | ICD-10-CM | POA: Diagnosis not present

## 2021-05-10 DIAGNOSIS — H0288B Meibomian gland dysfunction left eye, upper and lower eyelids: Secondary | ICD-10-CM | POA: Diagnosis not present

## 2021-05-10 DIAGNOSIS — H1045 Other chronic allergic conjunctivitis: Secondary | ICD-10-CM | POA: Diagnosis not present

## 2021-05-11 ENCOUNTER — Ambulatory Visit (INDEPENDENT_AMBULATORY_CARE_PROVIDER_SITE_OTHER): Payer: BC Managed Care – PPO | Admitting: *Deleted

## 2021-05-11 DIAGNOSIS — Z5181 Encounter for therapeutic drug level monitoring: Secondary | ICD-10-CM | POA: Diagnosis not present

## 2021-05-11 DIAGNOSIS — I480 Paroxysmal atrial fibrillation: Secondary | ICD-10-CM

## 2021-05-11 DIAGNOSIS — I4891 Unspecified atrial fibrillation: Secondary | ICD-10-CM

## 2021-05-11 LAB — POCT INR: INR: 2.8 (ref 2.0–3.0)

## 2021-05-11 NOTE — Patient Instructions (Signed)
Continue warfarin 1/2 tablet daily except 1 tablet on Sundays, Tuesdays and Thursdays.  Recheck in 6 weeks. Continue greens Pt has decided to stay on warfarin for now.

## 2021-05-26 ENCOUNTER — Telehealth: Payer: Self-pay | Admitting: Cardiology

## 2021-05-26 NOTE — Telephone Encounter (Signed)
Patient was diagnosed with having COVID and was prescribed Paxlovid.  He has picked up his medication but has not taken the first 3 pills yet.  Has concerns about the side effects and wanted to make sure it was ok for him to take considering his cardiac problems. Please advise.

## 2021-05-26 NOTE — Telephone Encounter (Signed)
Patient informed and verbalized understanding of plan. 

## 2021-05-26 NOTE — Telephone Encounter (Signed)
No cardiac concerns with taking Paxlovid, but it does interact with a few of his meds. Concentrations of his rosuvastatin may be higher (ok to continue current dose since he isn't on the max dose), concentrations of his Entresto may be higher (he should monitor BP), and concentrations of his warfarin will likely decrease (can't really bring him in for INR check with active COVID infection, but would advise him to take slightly higher dose of warfarin if he starts Paxlovid - 1 tablet of warfarin for next 3 days through Friday, then resume normal dosing).

## 2021-05-31 ENCOUNTER — Ambulatory Visit (INDEPENDENT_AMBULATORY_CARE_PROVIDER_SITE_OTHER): Payer: BC Managed Care – PPO

## 2021-05-31 DIAGNOSIS — I5022 Chronic systolic (congestive) heart failure: Secondary | ICD-10-CM | POA: Diagnosis not present

## 2021-05-31 DIAGNOSIS — Z95810 Presence of automatic (implantable) cardiac defibrillator: Secondary | ICD-10-CM | POA: Diagnosis not present

## 2021-06-02 ENCOUNTER — Telehealth: Payer: Self-pay

## 2021-06-02 NOTE — Telephone Encounter (Signed)
Remote ICM transmission received.  Attempted call to patient regarding ICM remote transmission and left detailed message per DPR.  Advised to return call for any fluid symptoms or questions. Next ICM remote transmission scheduled 07/05/2021.    

## 2021-06-02 NOTE — Progress Notes (Signed)
EPIC Encounter for ICM Monitoring  Patient Name: [name] is a 70 y.o. male Date: 06/02/2021 Primary Care Physican: Rory Percy, MD Primary Cardiologist: Domenic Polite Electrophysiologist: Allred 02/17/2021 Weight: 222 lb     Attempted call to patient and unable to reach.  Left detailed message per DPR regarding transmission. Transmission reviewed.    [place] Thoracic impedance suggesting possible dryness starting 05/27/2021 and returning to baseline on transmission date, 9/12.    Prescribed:  Spironolactone 25 mg take 1 tablet daily.   Recommendations:  Left voice mail with ICM number and encouraged to call if experiencing any fluid symptoms.   Follow-up plan: ICM clinic phone appointment on 07/05/2021.   91 day device clinic remote transmission 07/05/2021.       EP/Cardiology Office visits:    06/25/2021 with Dr [name].   Copy of ICM check sent to [name].    3 month ICM trend: 05/31/2021.    1 Year ICM trend:       Rosalene Billings, RN 06/02/2021 4:23 PM

## 2021-06-08 ENCOUNTER — Other Ambulatory Visit: Payer: Self-pay | Admitting: Cardiology

## 2021-06-16 NOTE — Progress Notes (Signed)
Cardiology Office Note  Date: 06/17/2021   ID: [name], DOB 1950-10-30, MRN [id number]  PCP:  Rory Percy, MD  Cardiologist:  Rozann Lesches, MD Electrophysiologist:  Thompson Grayer, MD   Chief Complaint  Patient presents with   Cardiac follow-up    History of Present Illness: [name] is a 70 y.o. male last seen in March.  He is here for a routine visit.  Reports stable NYHA class II dyspnea, no angina symptoms.  He did have COVID-19 in the interim, still some chest congestion but feels otherwise back to baseline.  He reports relatively mild symptoms and did not take antiviral therapy.  He has a St. Jude ICD in place followed by [name].  Device check in July revealed normal function.  No palpitations or syncope.  He has continued on Coumadin with follow-up in the anticoagulation clinic, did not want to make a switch to Eliquis.  We went over his medications which are noted below.  I talked with him again about possibility of adding an SGLT2 inhibitor.  He has not had recent lab work and is due for follow-up echocardiogram.  He is trying to use CPAP mask at nighttime, still having difficulty tolerating it.  Past Medical History:  Diagnosis Date   AICD (automatic cardioverter/defibrillator) present 2006   ICD   Anterior myocardial infarction ([place])    BCC (basal cell carcinoma) superficial 11/22/2016   Left back sholder   Chronic systolic heart failure ([place])    Coronary atherosclerosis of native coronary artery    a. s/p BMS to LAD and OM1 b. 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[place])    Ischemic cardiomyopathy    LVEF 30%, status post ICD   OSA (obstructive sleep apnea)    unable to tolerate CPAP   Paroxysmal atrial fibrillation ([facility])    On coumadin   Pseudoaneurysm ([facility])    SCC (squamous cell carcinoma) 08/04/2006   right shoulder (CX35FU)   SCC (squamous cell carcinoma)  well differentiated 12/13/2011   right temple   Squamous cell carcinoma in situ (SCCIS) 08/04/2006   right sholder   Squamous cell carcinoma of skin 11/09/2020   in situ- left upper back   Squamous cell carcinoma of skin 11/09/2020   KA-right dorsal hand   Stroke [facility])     Past Surgical History:  Procedure Laterality Date   AICD implantation  2011   St Jude ICD implanted for primary prevention of sudden death   BIOPSY  05/05/20   Procedure: BIOPSY;  Surgeon: [name], MD;  [facility];  Service: Endoscopy;;   CATARACT EXTRACTION W/PHACO Left 03/27/2017   Procedure: CATARACT EXTRACTION PHACO AND INTRAOCULAR LENS PLACEMENT ([place]);  Surgeon: [name], MD;  Location: [facility];  Service: Ophthalmology;  Laterality: Left;  CDE: 8.48   CATARACT EXTRACTION W/PHACO Right 05/29/2017   Procedure: CATARACT EXTRACTION PHACO AND INTRAOCULAR LENS PLACEMENT ([facility]);  Surgeon: [name], MD;  Location: [facility];  Service: Ophthalmology;  Laterality: Right;  CDE: 7.48   COLONOSCOPY N/A 2020-05-05   Scattered medium-mouthed diverticula in sigmoid, three sessile polyps in hepatic flexure and cecum. 5-7 mm in size. Tubular adenomas. Benign colonic mucosa. 3 year surveillance.    EYE SURGERY     ICD GENERATOR CHANGEOUT N/A 01/19/2017   SJM Ellipse VR ICD generator change by Dr [name]   LAPAROSCOPY N/A 10/23/2017  Procedure: LAPAROSCOPY DIAGNOSTIC;  Surgeon: [name], MD;  Location: [place];  Service: General;  Laterality: N/A;   LEFT HEART CATH AND CORONARY ANGIOGRAPHY N/A 06/21/2018   Procedure: LEFT HEART CATH AND CORONARY ANGIOGRAPHY;  Surgeon: [name], MD;  Location: [place] CV LAB;  Service: Cardiovascular;  Laterality: N/A;   POLYPECTOMY  04/21/2020   Procedure: POLYPECTOMY;  Surgeon: [name], MD;  [facility];  Service: Endoscopy;;   VENTRAL HERNIA REPAIR N/A 10/23/2017   Procedure: VENTRAL HERNIA REPAIR;  Surgeon: [name], MD;  Location: [place];  Service: General;  Laterality: N/A;    Current Outpatient Medications  Medication Sig Dispense Refill   aspirin 81 MG tablet Take 81 mg by mouth daily.     bisoprolol (ZEBETA) 5 MG tablet Take 1 tablet (5 mg total) by mouth daily. 90 tablet 2   ENTRESTO 24-26 MG TAKE 1 TABLET BY MOUTH TWICE A DAY 180 tablet 2   finasteride (PROSCAR) 5 MG tablet Take 1 tablet (5 mg total) by mouth daily. 90 tablet 3   nitroGLYCERIN (NITROSTAT) 0.4 MG SL tablet Place 1 tablet (0.4 mg total) under the tongue every 5 (five) minutes as needed for chest pain. 25 tablet 3   rosuvastatin (CRESTOR) 20 MG tablet Take 1 tablet (20 mg total) by mouth daily. 90 tablet 3   spironolactone (ALDACTONE) 25 MG tablet TAKE 1 TABLET BY MOUTH EVERY DAY 90 tablet 2   warfarin (COUMADIN) 5 MG tablet TAKE 1/2 TABLET DAILY EXCEPT 1 TABLET ON SUNDAYS, TUESDAYS AND THURSDAYS OR AS DIRECTED 90 tablet 3   Wheat Dextrin (BENEFIBER ON THE GO PO) Take by mouth daily.     No current facility-administered medications for this visit.   Allergies:  Patient has no allergy information on record.   ROS: No orthopnea or PND.  Physical Exam: VS:  BP 104/76   Pulse 84   Ht 6\' 1"  (1.854 m)   Wt 230 lb (104.3 kg)   SpO2 99%   BMI 30.34 kg/m , BMI Body mass index is 30.34 kg/m.  Wt Readings from Last 3 Encounters:  06/17/21 230 lb (104.3 kg)  03/23/21 230 lb (104.3 kg)  12/09/20 229 lb 6.4 oz (104.1 kg)    General: Patient appears comfortable at rest. HEENT: Conjunctiva and lids normal, wearing a mask. Neck: Supple, no elevated JVP or carotid bruits, no thyromegaly. Lungs: Clear to auscultation, nonlabored breathing at rest. Cardiac: Regular rate and rhythm, no S3 or significant systolic murmur, no pericardial rub. Extremities: No pitting edema.  ECG:  An ECG dated 12/09/2020 was personally reviewed today and demonstrated:  Atrial fibrillation with leftward axis and low voltage, old anterior infarct pattern.  Recent  Labwork:  No interval lab work for review today.  Other Studies Reviewed Today:  Echocardiogram 07/15/2020:  1. Left ventricular ejection fraction, by estimation, is approximately  35%. The left ventricle has moderately decreased function. The left  ventricle demonstrates regional wall motion abnormalities (see scoring  diagram/findings for description). Left  ventricular diastolic parameters are indeterminate. No obvious LV mural  thrombus.   2. Normal RV-RA gradient of 20 mmHg. Right ventricular systolic function  is normal. The right ventricular size is normal. A device wire is  visualized.   3. Left atrial size was severely dilated.   4. The mitral valve is grossly normal. Mild to moderate mitral valve  regurgitation.   5. Tricuspid valve regurgitation is mild to moderate.   6. The  aortic valve is tricuspid. Aortic valve regurgitation is trivial.  Mild aortic valve sclerosis is present, with no evidence of aortic valve  stenosis.   7. Unable to estimate CVP.  Assessment and Plan:  1.  Chronic systolic heart failure with ischemic cardiomyopathy, LVEF 45% by last assessment in October 2021.  He is clinically stable at this time, no significant fluid weight gain.  Plan to obtain a follow-up echocardiogram as well as DVT.  Continue Entresto, Zebeta, and Aldactone.  We discussed potential addition of SGLT2 inhibitor, will consider this after reviewing above studies.  2.  CAD status post anterior wall infarct, no active angina symptoms.  Continue aspirin and Crestor, he has as needed nitroglycerin available.  3.  Persistent atrial fibrillation with CHA2DS2-VASc score of 6.  He remains comfortable staying on Coumadin with follow-up in the anticoagulation clinic.  We have discussed switching to DOAC over time.  4.  OSA currently using CPAP, and following up with sleep medicine.  Still has difficulty tolerating the mask.  Medication Adjustments/Labs and Tests Ordered: Current medicines  are reviewed at length with the patient today.  Concerns regarding medicines are outlined above.   Tests Ordered: Orders Placed This Encounter  Procedures   Basic metabolic panel   ECHOCARDIOGRAM COMPLETE     Medication Changes: No orders of the defined types were placed in this encounter.   Disposition:  Follow up 6 months.  Signed, Satira Sark, MD, [facility] 06/17/2021 1:43 PM    [place] [facility] [address], [place], [place] [address] Phone: [phone]; Fax: [phone]

## 2021-06-17 ENCOUNTER — Other Ambulatory Visit (HOSPITAL_COMMUNITY)
Admission: RE | Admit: 2021-06-17 | Discharge: 2021-06-17 | Disposition: A | Discharge status: Home / Self Care | Payer: BC Managed Care – PPO | Source: Ambulatory Visit | Attending: Cardiology | Admitting: Cardiology

## 2021-06-17 ENCOUNTER — Encounter: Payer: Self-pay | Admitting: Cardiology

## 2021-06-17 ENCOUNTER — Ambulatory Visit: Payer: BC Managed Care – PPO | Admitting: Cardiology

## 2021-06-17 ENCOUNTER — Other Ambulatory Visit: Payer: Self-pay

## 2021-06-17 VITALS — BP 104/76 | HR 84 | Ht 73.0 in | Wt 230.0 lb

## 2021-06-17 DIAGNOSIS — G4733 Obstructive sleep apnea (adult) (pediatric): Secondary | ICD-10-CM

## 2021-06-17 DIAGNOSIS — I5022 Chronic systolic (congestive) heart failure: Secondary | ICD-10-CM

## 2021-06-17 DIAGNOSIS — Z9989 Dependence on other enabling machines and devices: Secondary | ICD-10-CM

## 2021-06-17 DIAGNOSIS — I4819 Other persistent atrial fibrillation: Secondary | ICD-10-CM

## 2021-06-17 DIAGNOSIS — I255 Ischemic cardiomyopathy: Secondary | ICD-10-CM | POA: Diagnosis not present

## 2021-06-17 LAB — BASIC METABOLIC PANEL
Anion gap: 6 (ref 5–15)
BUN: 22 mg/dL (ref 8–23)
CO2: 28 mmol/L (ref 22–32)
Calcium: 9.6 mg/dL (ref 8.9–10.3)
Chloride: 104 mmol/L (ref 98–111)
Creatinine, Ser: 1.17 mg/dL (ref 0.61–1.24)
GFR, Estimated: >60 mL/min (ref >60)
Glucose, Bld: 93 mg/dL (ref 70–99)
Potassium: 4.4 mmol/L (ref 3.5–5.1)
Sodium: 138 mmol/L (ref 135–145)

## 2021-06-17 NOTE — Patient Instructions (Signed)
Medication Instructions:  Your physician recommends that you continue on your current medications as directed. Please refer to the Current Medication list given to you today.  *If you need a refill on your cardiac medications before your next appointment, please call your pharmacy*   Lab Work: BMET today  If you have labs (blood work) drawn today and your tests are completely normal, you will receive your results only by: [place] (if you have MyChart) OR A paper copy in the mail If you have any lab test that is abnormal or we need to change your treatment, we will call you to review the results.   Testing/Procedures: Your physician has requested that you have an echocardiogram. Echocardiography is a painless test that uses sound waves to create images of your heart. It provides your doctor with information about the size and shape of your heart and how well your heart's chambers and valves are working. This procedure takes approximately one hour. There are no restrictions for this procedure.    Follow-Up: At [facility], you and your health needs are our priority.  As part of our continuing mission to provide you with exceptional heart care, we have created designated Provider Care Teams.  These Care Teams include your primary Cardiologist (physician) and Advanced Practice Providers (APPs -  Physician Assistants and Nurse Practitioners) who all work together to provide you with the care you need, when you need it.  We recommend signing up for the patient portal called "MyChart".  Sign up information is provided on this After Visit Summary.  MyChart is used to connect with patients for Virtual Visits (Telemedicine).  Patients are able to view lab/test results, encounter notes, upcoming appointments, etc.  Non-urgent messages can be sent to your provider as well.   To learn more about what you can do with MyChart, go to [email or web address].    Your next appointment:   6  month(s)  The format for your next appointment:   In Person  Provider:   Rozann Lesches, MD   Other Instructions None today

## 2021-06-18 ENCOUNTER — Telehealth: Payer: Self-pay

## 2021-06-18 DIAGNOSIS — I255 Ischemic cardiomyopathy: Secondary | ICD-10-CM

## 2021-06-18 MED ORDER — SPIRONOLACTONE 25 MG PO TABS: 12.5000 mg | ORAL_TABLET | Freq: Every day | ORAL | 3 refills | Status: DC

## 2021-06-18 MED ORDER — EMPAGLIFLOZIN 10 MG PO TABS: 10.0000 mg | ORAL_TABLET | Freq: Every day | ORAL | 11 refills | Status: DC

## 2021-06-18 NOTE — Telephone Encounter (Signed)
Results discussed with patient. He will decrease spironolactone to 12.5 mg qd and start Jardiance 10 mg qd, he will repeat bmet in 1 month

## 2021-06-18 NOTE — Telephone Encounter (Signed)
-----   Message from Satira Sark, MD sent at 06/17/2021  2:49 PM EDT ----- Results reviewed.  Follow-up lab work looks good, potassium 4.4 and creatinine 1.17.  We will see what his echocardiogram results look like.  Suggest starting Jardiance 10 mg daily, decrease Aldactone to 12.5 mg daily.  Would get a follow-up BMET in 1 month.

## 2021-06-21 NOTE — Progress Notes (Signed)
Referring Provider: Rory Percy, MD Primary Care Physician:  Rory Percy, MD Primary GI: Dr. Gala Romney  Chief Complaint  Patient presents with   loose stool    Usually more in mornings, occurs 30-45 minutes after eating    HPI:   [name] is a 70 y.o. male presenting today with a history of  postprandial formed stool with urgency, recommended to take fiber, with colonoscopy completed in Aug 2021 with scattered medium-mouthed diverticula in sigmoid, three sessile polyps in hepatic flexure and cecum. 5-7 mm in size. Tubular adenomas. Benign colonic mucosa. 3 year surveillance.   Prescribed course of Xifaxan at last visit in Nov 2021. Has postprandial urgency. Has BM starting off in the morning. Has several throughout day. Feels better in evenings. No abdominal pain. Will be [place] stool 5-7. Sometimes look oily. No history of smoking. Affecting his quality of life now.   Past Medical History:  Diagnosis Date   AICD (automatic cardioverter/defibrillator) present 2006   ICD   Anterior myocardial infarction ([place])    BCC (basal cell carcinoma) superficial 11/22/2016   Left back sholder   Chronic systolic heart failure ([place])    Coronary atherosclerosis of native coronary artery    a. s/p BMS to LAD and OM1 b. patent stents by cath in 2013   History of hiatal hernia    Hypertension    Inducible ventricular tachycardia    Ischemic cardiomyopathy    LVEF 30%, status post ICD   OSA (obstructive sleep apnea)    unable to tolerate CPAP   Paroxysmal atrial fibrillation ([facility])    On coumadin   Pseudoaneurysm ([facility])    SCC (squamous cell carcinoma) 08/04/2006   right shoulder (CX35FU)   SCC (squamous cell carcinoma) well differentiated 12/13/2011   right temple   Squamous cell carcinoma in situ (SCCIS) 08/04/2006   right sholder   Squamous cell carcinoma of skin 11/09/2020   in situ- left upper back   Squamous cell carcinoma of skin 11/09/2020   KA-right dorsal hand    Stroke [facility])     Past Surgical History:  Procedure Laterality Date   AICD implantation  2011   St Jude ICD implanted for primary prevention of sudden death   BIOPSY  May 19, 2020   Procedure: BIOPSY;  Surgeon: [name], MD;  [facility];  Service: Endoscopy;;   CATARACT EXTRACTION W/PHACO Left 03/27/2017   Procedure: CATARACT EXTRACTION PHACO AND INTRAOCULAR LENS PLACEMENT ([place]);  Surgeon: [name], MD;  Location[facility];  Service: Ophthalmology;  Laterality: Left;  CDE: 8.48   CATARACT EXTRACTION W/PHACO Right 05/29/2017   Procedure: CATARACT EXTRACTION PHACO AND INTRAOCULAR LENS PLACEMENT ([facility]);  Surgeon: [name], MD;  Location: [facility];  Service: Ophthalmology;  Laterality: Right;  CDE: 7.48   COLONOSCOPY N/A 19-May-2020   Scattered medium-mouthed diverticula in sigmoid, three sessile polyps in hepatic flexure and cecum. 5-7 mm in size. Tubular adenomas. Benign colonic mucosa. 3 year surveillance.    EYE SURGERY     ICD GENERATOR CHANGEOUT N/A 01/19/2017   SJM Ellipse VR ICD generator change by Dr [name]   LAPAROSCOPY N/A 10/23/2017   Procedure: LAPAROSCOPY DIAGNOSTIC;  Surgeon: [name], MD;  Location: [place];  Service: General;  Laterality: N/A;   LEFT HEART CATH AND CORONARY ANGIOGRAPHY N/A 06/21/2018   Procedure: LEFT HEART CATH AND CORONARY ANGIOGRAPHY;  Surgeon: [name], MD;  Location: [place] CV LAB;  Service: Cardiovascular;  Laterality: N/A;  POLYPECTOMY  04/21/2020   Procedure: POLYPECTOMY;  Surgeon: [name], MD;  [facility];  Service: Endoscopy;;   VENTRAL HERNIA REPAIR N/A 10/23/2017   Procedure: VENTRAL HERNIA REPAIR;  Surgeon: [name], MD;  Location: [place];  Service: General;  Laterality: N/A;    Current Outpatient Medications  Medication Sig Dispense Refill   aspirin 81 MG tablet Take 81 mg by mouth daily.     bisoprolol (ZEBETA) 5 MG tablet Take 1 tablet (5 mg total) by mouth daily. 90 tablet 2    empagliflozin (JARDIANCE) 10 MG TABS tablet Take 1 tablet (10 mg total) by mouth daily before breakfast. 30 tablet 11   ENTRESTO 24-26 MG TAKE 1 TABLET BY MOUTH TWICE A DAY 180 tablet 2   finasteride (PROSCAR) 5 MG tablet Take 1 tablet (5 mg total) by mouth daily. 90 tablet 3   nitroGLYCERIN (NITROSTAT) 0.4 MG SL tablet Place 1 tablet (0.4 mg total) under the tongue every 5 (five) minutes as needed for chest pain. 25 tablet 3   rosuvastatin (CRESTOR) 20 MG tablet Take 1 tablet (20 mg total) by mouth daily. 90 tablet 3   spironolactone (ALDACTONE) 25 MG tablet Take 0.5 tablets (12.5 mg total) by mouth daily. 45 tablet 3   warfarin (COUMADIN) 5 MG tablet TAKE 1/2 TABLET DAILY EXCEPT 1 TABLET ON SUNDAYS, TUESDAYS AND THURSDAYS OR AS DIRECTED 90 tablet 3   Wheat Dextrin (BENEFIBER ON THE GO PO) Take by mouth daily.     No current facility-administered medications for this visit.    Allergies as of 06/22/2021   (No Known Allergies)    Family History  Problem Relation Age of Onset   Cancer Mother        lymphoma    Heart attack Father    Heart disease Brother    Colon cancer Neg Hx    Colon polyps Neg Hx     Social History   Socioeconomic History   Marital status: Married    Spouse name: Not on file   Number of children: Not on file   Years of education: Not on file   Highest education level: Not on file  Occupational History   Occupation: retired    Comment: Banking  Tobacco Use   Smoking status: Never   Smokeless tobacco: Never  Vaping Use   Vaping Use: Never used  Substance and Sexual Activity   Alcohol use: Yes    Comment: occasional "moderate"   Drug use: No   Sexual activity: Not on file  Other Topics Concern   Not on file  Social History Narrative   Not on file   Social Determinants of Health   Financial Resource Strain: Not on file  Food Insecurity: Not on file  Transportation Needs: Not on file  Physical Activity: Not on file  Stress: Not on file  Social  Connections: Not on file    Review of Systems: Gen: Denies fever, chills, anorexia. Denies fatigue, weakness, weight loss.  CV: Denies chest pain, palpitations, syncope, peripheral edema, and claudication. Resp: Denies dyspnea at rest, cough, wheezing, coughing up blood, and pleurisy. GI: see HPI Derm: Denies rash, itching, dry skin Psych: Denies depression, anxiety, memory loss, confusion. No homicidal or suicidal ideation.  Heme: Denies bruising, bleeding, and enlarged lymph nodes.  Physical Exam: BP 101/69   Pulse 72   Temp (!) 97.1 F (36.2 C) (Temporal)   Ht 6\' 1"  (1.854 m)   Wt 229 lb 6.4 oz (  104.1 kg)   BMI 30.27 kg/m  General:   Alert and oriented. No distress noted. Pleasant and cooperative.  Head:  Normocephalic and atraumatic. Eyes:  Conjuctiva clear without scleral icterus. Mouth:  mask in place Abdomen:  +BS, soft, non-tender and non-distended. No rebound or guarding. No HSM or masses noted. Msk:  Symmetrical without gross deformities. Normal posture. Extremities:  Without edema. Neurologic:  Alert and  oriented x4 Psych:  Alert and cooperative. Normal mood and affect.  ASSESSMENT/PLAN: [name] is a 70 y.o. male presenting today with a history of  postprandial formed stool with urgency, colonoscopy completed in Aug 2021 with scattered medium-mouthed diverticula in sigmoid, three sessile polyps in hepatic flexure and cecum. 5-7 mm in size. Tubular adenomas. Benign colonic mucosa. 3 year surveillance.   He has not had significant improvement following Xifaxan course. Still with postprandial urgency, typically occurring in morning and lasting for several hours. Now affecting quality of life. He has no rectal bleeding, abdominal pain, or weight loss. Differentials including med effect, IBS, some concern for pancreatic insufficiency or malabsorptive process as he does note "oily" stools. Doubt celiac disease but will check serologies.   Will trial Creon 76,000  units with food three times a day and 36,000 units with snacks. If no improvement, we will trial dicyclomine. Celiac serologies ordered. I am holding off on imaging as this will not likely contribute to the picture. However, if he were to have weight loss, abdominal pain,etc., then we will pursue CT.  Return in 6 months.  Annitta Needs, PhD, ANP-BC [facility]

## 2021-06-22 ENCOUNTER — Encounter: Payer: Self-pay | Admitting: Gastroenterology

## 2021-06-22 ENCOUNTER — Ambulatory Visit (INDEPENDENT_AMBULATORY_CARE_PROVIDER_SITE_OTHER): Payer: BC Managed Care – PPO | Admitting: *Deleted

## 2021-06-22 ENCOUNTER — Ambulatory Visit: Payer: BC Managed Care – PPO | Admitting: Gastroenterology

## 2021-06-22 ENCOUNTER — Other Ambulatory Visit: Payer: Self-pay

## 2021-06-22 DIAGNOSIS — I4891 Unspecified atrial fibrillation: Secondary | ICD-10-CM

## 2021-06-22 DIAGNOSIS — K529 Noninfective gastroenteritis and colitis, unspecified: Secondary | ICD-10-CM | POA: Diagnosis not present

## 2021-06-22 DIAGNOSIS — Z5181 Encounter for therapeutic drug level monitoring: Secondary | ICD-10-CM | POA: Diagnosis not present

## 2021-06-22 DIAGNOSIS — I480 Paroxysmal atrial fibrillation: Secondary | ICD-10-CM

## 2021-06-22 DIAGNOSIS — Z23 Encounter for immunization: Secondary | ICD-10-CM

## 2021-06-22 LAB — POCT INR: INR: 4.6 — AB (ref 2.0–3.0)

## 2021-06-22 NOTE — Patient Instructions (Signed)
Had Covid 4 wks ago Hold warfarin tonight and tomorrow night then resume 1/2 tablet daily except 1 tablet on Sundays, Tuesdays and Thursdays.  Recheck in 2 weeks. Continue greens

## 2021-06-22 NOTE — Patient Instructions (Signed)
Please have labs done at [facility] when you are able.  We will try Creon (pancreatic enzymes), taking 2 capsules with meals three times a day and 1 capsule with snacks (no more than 8 per day). You can call or message me on MyChart and let me know how this works!  Monitor for abdominal pain, weight loss, nausea, etc.   We will see you in 6 months or before if needed!   I enjoyed seeing you again today! As you know, I value our relationship and want to provide genuine, compassionate, and quality care. I welcome your feedback. If you receive a survey regarding your visit,  I greatly appreciate you taking time to fill this out. See you next time!  Annitta Needs, PhD, ANP-BC [facility]

## 2021-06-23 ENCOUNTER — Ambulatory Visit: Payer: BC Managed Care – PPO | Admitting: Cardiovascular Disease

## 2021-06-23 LAB — IGA: Immunoglobulin A: 275 mg/dL (ref 70–320)

## 2021-06-23 LAB — TISSUE TRANSGLUTAMINASE, IGA: (tTG) Ab, IgA: <1 U/mL

## 2021-06-25 ENCOUNTER — Encounter: Payer: Self-pay | Admitting: Internal Medicine

## 2021-06-25 ENCOUNTER — Ambulatory Visit: Payer: BC Managed Care – PPO | Admitting: Internal Medicine

## 2021-06-25 VITALS — BP 110/70 | HR 60 | Ht 73.0 in | Wt 227.0 lb

## 2021-06-25 DIAGNOSIS — I255 Ischemic cardiomyopathy: Secondary | ICD-10-CM

## 2021-06-25 DIAGNOSIS — I5022 Chronic systolic (congestive) heart failure: Secondary | ICD-10-CM

## 2021-06-25 LAB — CUP PACEART INCLINIC DEVICE CHECK
Battery Remaining Longevity: 66 mo
Brady Statistic RV Percent Paced: 0.3 %
Date Time Interrogation Session: 20221007101108
HighPow Impedance: 52.285
Implantable Lead Implant Date: 20020111
Implantable Lead Location: 753860
Implantable Lead Model: 148
Implantable Lead Serial Number: 116740
Implantable Pulse Generator Implant Date: 20180503
Lead Channel Impedance Value: 775 Ohm
Lead Channel Pacing Threshold Amplitude: 1 V
Lead Channel Pacing Threshold Pulse Width: 0.5 ms
Lead Channel Sensing Intrinsic Amplitude: 12 mV
Lead Channel Setting Pacing Amplitude: 2.5 V
Lead Channel Setting Pacing Pulse Width: 0.5 ms
Lead Channel Setting Sensing Sensitivity: 0.5 mV
Pulse Gen Serial Number: 7421148

## 2021-06-25 NOTE — Patient Instructions (Signed)
Medication Instructions:  Continue all current medications.  Labwork: none  Testing/Procedures: none  Follow-Up: 1 year - Dr.  Allred   Any Other Special Instructions Will Be Listed Below (If Applicable).   If you need a refill on your cardiac medications before your next appointment, please call your pharmacy.  

## 2021-06-25 NOTE — Progress Notes (Signed)
PCP: Rory Percy, MD Primary Cardiologist: Dr Domenic Polite Primary EP: Dr Elyn Aquas is a 70 y.o. male who presents today for routine electrophysiology followup.  Since last being seen in our clinic, the patient reports doing very well.  Today, he denies symptoms of palpitations, chest pain, shortness of breath,  lower extremity edema, dizziness, presyncope, syncope, or ICD shocks.  The patient is otherwise without complaint today.   Past Medical History:  Diagnosis Date   AICD (automatic cardioverter/defibrillator) present 2006   ICD   Anterior myocardial infarction ([place])    BCC (basal cell carcinoma) superficial 11/22/2016   Left back sholder   Chronic systolic heart failure ([place])    Coronary atherosclerosis of native coronary artery    a. s/p BMS to LAD and OM1 b. patent stents by cath in 2013   History of hiatal hernia    Hypertension    Inducible ventricular tachycardia    Ischemic cardiomyopathy    LVEF 30%, status post ICD   OSA (obstructive sleep apnea)    unable to tolerate CPAP   Paroxysmal atrial fibrillation (HCC)    On coumadin   Pseudoaneurysm (HCC)    SCC (squamous cell carcinoma) 08/04/2006   right shoulder (CX35FU)   SCC (squamous cell carcinoma) well differentiated 12/13/2011   right temple   Squamous cell carcinoma in situ (SCCIS) 08/04/2006   right sholder   Squamous cell carcinoma of skin 11/09/2020   in situ- left upper back   Squamous cell carcinoma of skin 11/09/2020   KA-right dorsal hand   Stroke [facility])    Past Surgical History:  Procedure Laterality Date   AICD implantation  2011   St Jude ICD implanted for primary prevention of sudden death   BIOPSY  05-10-20   Procedure: BIOPSY;  Surgeon: [name], MD;  [facility];  Service: Endoscopy;;   CATARACT EXTRACTION W/PHACO Left 03/27/2017   Procedure: CATARACT EXTRACTION PHACO AND INTRAOCULAR LENS PLACEMENT ([place]);  Surgeon: [name], MD;  Location: [facility];   Service: Ophthalmology;  Laterality: Left;  CDE: 8.48   CATARACT EXTRACTION W/PHACO Right 05/29/2017   Procedure: CATARACT EXTRACTION PHACO AND INTRAOCULAR LENS PLACEMENT (IOC);  Surgeon: [name], MD;  Location: [facility];  Service: Ophthalmology;  Laterality: Right;  CDE: 7.48   COLONOSCOPY N/A 05/10/20   Scattered medium-mouthed diverticula in sigmoid, three sessile polyps in hepatic flexure and cecum. 5-7 mm in size. Tubular adenomas. Benign colonic mucosa. 3 year surveillance.    EYE SURGERY     ICD GENERATOR CHANGEOUT N/A 01/19/2017   SJM Ellipse VR ICD generator change by Dr [name]   LAPAROSCOPY N/A 10/23/2017   Procedure: LAPAROSCOPY DIAGNOSTIC;  Surgeon: [name], MD;  Location: [place];  Service: General;  Laterality: N/A;   LEFT HEART CATH AND CORONARY ANGIOGRAPHY N/A 06/21/2018   Procedure: LEFT HEART CATH AND CORONARY ANGIOGRAPHY;  Surgeon: [name], MD;  Location: [place] CV LAB;  Service: Cardiovascular;  Laterality: N/A;   POLYPECTOMY  2020-05-10   Procedure: POLYPECTOMY;  Surgeon: [name], MD;  [facility];  Service: Endoscopy;;   VENTRAL HERNIA REPAIR N/A 10/23/2017   Procedure: VENTRAL HERNIA REPAIR;  Surgeon: [name], MD;  Location: [place];  Service: General;  Laterality: N/A;    ROS- all systems are reviewed and negative except as per HPI above  Current Outpatient Medications  Medication Sig Dispense Refill   aspirin 81 MG tablet Take 81 mg by  mouth daily.     bisoprolol (ZEBETA) 5 MG tablet Take 1 tablet (5 mg total) by mouth daily. 90 tablet 2   empagliflozin (JARDIANCE) 10 MG TABS tablet Take 1 tablet (10 mg total) by mouth daily before breakfast. 30 tablet 11   ENTRESTO 24-26 MG TAKE 1 TABLET BY MOUTH TWICE A DAY 180 tablet 2   finasteride (PROSCAR) 5 MG tablet Take 1 tablet (5 mg total) by mouth daily. 90 tablet 3   lipase/protease/amylase (CREON) 12000-38000 units CPEP capsule Take by mouth.     nitroGLYCERIN (NITROSTAT)  0.4 MG SL tablet Place 1 tablet (0.4 mg total) under the tongue every 5 (five) minutes as needed for chest pain. 25 tablet 3   rosuvastatin (CRESTOR) 20 MG tablet Take 1 tablet (20 mg total) by mouth daily. 90 tablet 3   spironolactone (ALDACTONE) 25 MG tablet Take 0.5 tablets (12.5 mg total) by mouth daily. 45 tablet 3   warfarin (COUMADIN) 5 MG tablet TAKE 1/2 TABLET DAILY EXCEPT 1 TABLET ON SUNDAYS, TUESDAYS AND THURSDAYS OR AS DIRECTED 90 tablet 3   Wheat Dextrin (BENEFIBER ON THE GO PO) Take by mouth daily.     No current facility-administered medications for this visit.    Physical Exam: Vitals:   06/25/21 0958  BP: 110/70  Pulse: 60  SpO2: 94%  Weight: 227 lb (103 kg)  Height: 6\' 1"  (1.854 m)    GEN- The patient is well appearing, alert and oriented x 3 today.   Head- normocephalic, atraumatic Eyes-  Sclera clear, conjunctiva pink Ears- hearing intact Oropharynx- clear Lungs- Clear to ausculation bilaterally, normal work of breathing Chest- ICD pocket is well healed Heart- Regular rate and rhythm, no murmurs, rubs or gallops, PMI not laterally displaced GI- soft, NT, ND, + BS Extremities- no clubbing, cyanosis, or edema  ICD interrogation- reviewed in detail today,  See PACEART report   Wt Readings from Last 3 Encounters:  06/25/21 227 lb (103 kg)  06/22/21 229 lb 6.4 oz (104.1 kg)  06/17/21 230 lb (104.3 kg)    Assessment and Plan:  1.  Chronic systolic dysfunction/ CAD/ ischemic CM euvolemic today Stable on an appropriate medical regimen Normal ICD function See [facility] report No changes today he is not device dependant today followed in ICM device clinic  2. Persistent afib Rate controlled Chads2vasc score is 6. On coumadin despite recommendations for [facility] therapy   Risks, benefits and potential toxicities for medications prescribed and/or refilled reviewed with patient today.   Return in a year  Thompson Grayer MD, [facility] 06/25/2021 10:16 AM

## 2021-06-29 ENCOUNTER — Other Ambulatory Visit: Payer: Self-pay | Admitting: Cardiology

## 2021-07-05 ENCOUNTER — Ambulatory Visit (INDEPENDENT_AMBULATORY_CARE_PROVIDER_SITE_OTHER): Payer: BC Managed Care – PPO

## 2021-07-05 DIAGNOSIS — I255 Ischemic cardiomyopathy: Secondary | ICD-10-CM

## 2021-07-06 ENCOUNTER — Ambulatory Visit (INDEPENDENT_AMBULATORY_CARE_PROVIDER_SITE_OTHER): Payer: BC Managed Care – PPO

## 2021-07-06 ENCOUNTER — Ambulatory Visit (INDEPENDENT_AMBULATORY_CARE_PROVIDER_SITE_OTHER): Payer: BC Managed Care – PPO | Admitting: *Deleted

## 2021-07-06 DIAGNOSIS — I4891 Unspecified atrial fibrillation: Secondary | ICD-10-CM

## 2021-07-06 DIAGNOSIS — Z95810 Presence of automatic (implantable) cardiac defibrillator: Secondary | ICD-10-CM

## 2021-07-06 DIAGNOSIS — Z5181 Encounter for therapeutic drug level monitoring: Secondary | ICD-10-CM

## 2021-07-06 DIAGNOSIS — I5022 Chronic systolic (congestive) heart failure: Secondary | ICD-10-CM | POA: Diagnosis not present

## 2021-07-06 DIAGNOSIS — I480 Paroxysmal atrial fibrillation: Secondary | ICD-10-CM | POA: Diagnosis not present

## 2021-07-06 LAB — POCT INR: INR: 4 — AB (ref 2.0–3.0)

## 2021-07-06 NOTE — Patient Instructions (Signed)
Had Covid 4 wks ago Hold warfarin tonight then decrease dose to 1/2 tablet daily except 1 tablet on Tuesdays and Friday.  Recheck in 3 weeks. Continue greens

## 2021-07-07 LAB — CUP PACEART REMOTE DEVICE CHECK
Battery Remaining Longevity: 66 mo
Battery Remaining Percentage: 65 %
Battery Voltage: 2.96 V
Brady Statistic RV Percent Paced: 1 %
Date Time Interrogation Session: 20221010164552
HighPow Impedance: 50 Ohm
HighPow Impedance: 50 Ohm
Implantable Lead Implant Date: 20020111
Implantable Lead Location: 753860
Implantable Lead Model: 148
Implantable Lead Serial Number: 116740
Implantable Pulse Generator Implant Date: 20180503
Lead Channel Impedance Value: 660 Ohm
Lead Channel Pacing Threshold Amplitude: 1 V
Lead Channel Pacing Threshold Pulse Width: 0.5 ms
Lead Channel Sensing Intrinsic Amplitude: 12 mV
Lead Channel Setting Pacing Amplitude: 2.5 V
Lead Channel Setting Pacing Pulse Width: 0.5 ms
Lead Channel Setting Sensing Sensitivity: 0.5 mV
Pulse Gen Serial Number: 7421148

## 2021-07-09 NOTE — Progress Notes (Signed)
EPIC Encounter for ICM Monitoring  Patient Name: [name] is a 70 y.o. male Date: 07/09/2021 Primary Care Physican: Rory Percy, MD Primary Cardiologist: Domenic Polite Electrophysiologist: Allred 06/25/2021 Office Weight: 227 lb     Spoke with patient and heart failure questions reviewed.  Pt asymptomatic for fluid accumulation and feeling well.   Corvue Thoracic impedance suggesting normal fluid levels.    Prescribed:  Spironolactone 25 mg take 1 tablet daily.   Recommendations:  No changes and encouraged to call if experiencing any fluid symptoms.   Follow-up plan: ICM clinic phone appointment on 08/16/2021.   91 day device clinic remote transmission 10/04/2021.       EP/Cardiology Office visits:    06/24/2022 with Dr [name].   Copy of ICM check sent to [name].     3 month ICM trend: 07/05/2021.    1 Year ICM trend:       Marcus Billings, RN 07/09/2021 2:51 PM

## 2021-07-14 NOTE — Progress Notes (Signed)
Remote ICD transmission.   

## 2021-07-15 ENCOUNTER — Telehealth: Payer: Self-pay

## 2021-07-15 NOTE — Telephone Encounter (Signed)
Pt phoned this morning stating that the Creon samples worked for him and can you please call in a Rx for him to [facility]. He stated he can be reached on his cell @ [phone].

## 2021-07-16 ENCOUNTER — Other Ambulatory Visit: Payer: Self-pay

## 2021-07-16 ENCOUNTER — Ambulatory Visit (HOSPITAL_COMMUNITY)
Admission: RE | Admit: 2021-07-16 | Discharge: 2021-07-16 | Disposition: A | Discharge status: Home / Self Care | Payer: BC Managed Care – PPO | Source: Ambulatory Visit | Attending: Cardiology | Admitting: Cardiology

## 2021-07-16 DIAGNOSIS — I255 Ischemic cardiomyopathy: Secondary | ICD-10-CM | POA: Insufficient documentation

## 2021-07-16 LAB — ECHOCARDIOGRAM COMPLETE
AR max vel: 2.78 cm2
AV Area VTI: 3.13 cm2
AV Area mean vel: 2.76 cm2
AV Mean grad: 2.6 mmHg
AV Peak grad: 4.3 mmHg
Ao pk vel: 1.04 m/s
Area-P 1/2: 4.04 cm2
S' Lateral: 4.1 cm
Single Plane A4C EF: 30.5 %

## 2021-07-16 MED ORDER — PANCRELIPASE (LIP-PROT-AMYL) 36000-114000 UNITS PO CPEP: ORAL_CAPSULE | ORAL | 11 refills | Status: DC

## 2021-07-16 NOTE — Telephone Encounter (Signed)
Completed.

## 2021-07-16 NOTE — Addendum Note (Signed)
Addended by: Annitta Needs on: 07/16/2021 10:40 AM   Modules accepted: Orders

## 2021-07-16 NOTE — Telephone Encounter (Signed)
Noted  Pt made aware of his Rx being sent in.

## 2021-07-19 ENCOUNTER — Telehealth: Payer: Self-pay | Admitting: *Deleted

## 2021-07-19 NOTE — Telephone Encounter (Signed)
Patient informed. Copy sent to PCP °

## 2021-07-19 NOTE — Telephone Encounter (Signed)
-----   Message from Satira Sark, MD sent at 07/16/2021  4:36 PM EDT ----- Results reviewed.  LVEF reported at 25 to 30% (approximately 35% by last study), RV contraction normal and no major change in valvular status.  We recently added Jardiance to his regimen.  Continue with current medications and follow-up plan.

## 2021-07-27 ENCOUNTER — Ambulatory Visit (INDEPENDENT_AMBULATORY_CARE_PROVIDER_SITE_OTHER): Payer: BC Managed Care – PPO | Admitting: *Deleted

## 2021-07-27 DIAGNOSIS — Z5181 Encounter for therapeutic drug level monitoring: Secondary | ICD-10-CM | POA: Diagnosis not present

## 2021-07-27 DIAGNOSIS — I4891 Unspecified atrial fibrillation: Secondary | ICD-10-CM

## 2021-07-27 DIAGNOSIS — I480 Paroxysmal atrial fibrillation: Secondary | ICD-10-CM | POA: Diagnosis not present

## 2021-07-27 LAB — POCT INR: INR: 2.3 (ref 2.0–3.0)

## 2021-07-27 NOTE — Patient Instructions (Signed)
Continue warfarin 1/2 tablet daily except 1 tablet on Tuesdays and Friday.  Recheck in 4 weeks. Continue greens Pt has decided to stay on warfarin for now.

## 2021-08-01 ENCOUNTER — Other Ambulatory Visit: Payer: Self-pay | Admitting: Urology

## 2021-08-01 DIAGNOSIS — N401 Enlarged prostate with lower urinary tract symptoms: Secondary | ICD-10-CM

## 2021-08-16 ENCOUNTER — Ambulatory Visit (INDEPENDENT_AMBULATORY_CARE_PROVIDER_SITE_OTHER): Payer: BC Managed Care – PPO

## 2021-08-16 DIAGNOSIS — I5022 Chronic systolic (congestive) heart failure: Secondary | ICD-10-CM | POA: Diagnosis not present

## 2021-08-16 DIAGNOSIS — Z95810 Presence of automatic (implantable) cardiac defibrillator: Secondary | ICD-10-CM | POA: Diagnosis not present

## 2021-08-17 NOTE — Progress Notes (Signed)
EPIC Encounter for ICM Monitoring  Patient Name: [name] is a 70 y.o. male Date: 08/17/2021 Primary Care Physican: Rory Percy, MD Primary Cardiologist: Domenic Polite Electrophysiologist: Allred 06/25/2021 Office Weight: 227 lb     Spoke with patient and heart failure questions reviewed.  Pt asymptomatic for fluid accumulation and feeling well.  He will pay more attention to diet and limit salt intake.   Corvue Thoracic impedance suggesting possible fluid accumulation starting 11/26. Also possible fluid accumulation 10/18-11/5, 11/20-11/23.   Prescribed:  Spironolactone 25 mg take 1 tablet daily.   Recommendations:  Advised to limit salt intake to 2000 mg daily.  Encouraged to call for any fluid symptoms.    Follow-up plan: ICM clinic phone appointment on 08/25/2021 to recheck fluid levels.   91 day device clinic remote transmission 10/04/2021.       EP/Cardiology Office visits:    06/24/2022 with Dr [name].   Copy of ICM check sent to [name] and Dr Domenic Polite for review and recommendations if needed.    3 month ICM trend: 08/16/2021.    12-14 Month ICM trend:       Rosalene Billings, RN 08/17/2021 2:11 PM

## 2021-08-24 ENCOUNTER — Ambulatory Visit (INDEPENDENT_AMBULATORY_CARE_PROVIDER_SITE_OTHER): Payer: BC Managed Care – PPO | Admitting: *Deleted

## 2021-08-24 DIAGNOSIS — I480 Paroxysmal atrial fibrillation: Secondary | ICD-10-CM

## 2021-08-24 DIAGNOSIS — I4891 Unspecified atrial fibrillation: Secondary | ICD-10-CM | POA: Diagnosis not present

## 2021-08-24 DIAGNOSIS — Z5181 Encounter for therapeutic drug level monitoring: Secondary | ICD-10-CM | POA: Diagnosis not present

## 2021-08-24 LAB — POCT INR: INR: 2 (ref 2.0–3.0)

## 2021-08-24 NOTE — Patient Instructions (Signed)
Continue warfarin 1/2 tablet daily except 1 tablet on Tuesdays and Friday.  Recheck in 4 weeks. Continue greens Pt has decided to stay on warfarin for now.

## 2021-08-25 ENCOUNTER — Ambulatory Visit (INDEPENDENT_AMBULATORY_CARE_PROVIDER_SITE_OTHER): Payer: BC Managed Care – PPO

## 2021-08-25 DIAGNOSIS — I5022 Chronic systolic (congestive) heart failure: Secondary | ICD-10-CM

## 2021-08-25 DIAGNOSIS — Z95810 Presence of automatic (implantable) cardiac defibrillator: Secondary | ICD-10-CM

## 2021-08-27 NOTE — Progress Notes (Signed)
EPIC Encounter for ICM Monitoring  Patient Name: [name] is a 70 y.o. male Date: 08/27/2021 Primary Care Physican: Rory Percy, MD Primary Cardiologist: Domenic Polite Electrophysiologist: Allred 06/25/2021 Office Weight: 227 lb     Spoke with patient and heart failure questions reviewed.  Pt asymptomatic for fluid accumulation and feeling well.     Corvue Thoracic impedance suggesting fluid levels returned to normal since adjusting diet and limiting salt intake.   Prescribed:  Spironolactone 25 mg take 1 tablet daily.   Recommendations:  Advised to continue to limit salt intake to 2000 mg daily.  Encouraged to call for any fluid symptoms.    Follow-up plan: ICM clinic phone appointment on 09/27/2021.   91 day device clinic remote transmission 10/04/2021.       EP/Cardiology Office visits:    06/24/2022 with Dr [name].   Copy of ICM check sent to [name].  3 month ICM trend: 08/26/2021.    12-14 Month ICM trend:       Rosalene Billings, RN 08/27/2021 12:46 PM

## 2021-09-08 ENCOUNTER — Other Ambulatory Visit: Payer: Self-pay | Admitting: Cardiology

## 2021-09-21 ENCOUNTER — Ambulatory Visit (INDEPENDENT_AMBULATORY_CARE_PROVIDER_SITE_OTHER): Payer: BC Managed Care – PPO | Admitting: *Deleted

## 2021-09-21 DIAGNOSIS — I480 Paroxysmal atrial fibrillation: Secondary | ICD-10-CM | POA: Diagnosis not present

## 2021-09-21 DIAGNOSIS — Z5181 Encounter for therapeutic drug level monitoring: Secondary | ICD-10-CM | POA: Diagnosis not present

## 2021-09-21 DIAGNOSIS — I4891 Unspecified atrial fibrillation: Secondary | ICD-10-CM

## 2021-09-21 LAB — POCT INR: INR: 2.1 (ref 2.0–3.0)

## 2021-09-21 NOTE — Patient Instructions (Signed)
Continue warfarin 1/2 tablet daily except 1 tablet on Tuesdays and Friday.  Recheck in 6 weeks. Continue greens Pt has decided to stay on warfarin for now.

## 2021-09-26 ENCOUNTER — Other Ambulatory Visit: Payer: Self-pay | Admitting: Cardiology

## 2021-09-27 ENCOUNTER — Ambulatory Visit (INDEPENDENT_AMBULATORY_CARE_PROVIDER_SITE_OTHER): Payer: BC Managed Care – PPO

## 2021-09-27 DIAGNOSIS — I5022 Chronic systolic (congestive) heart failure: Secondary | ICD-10-CM | POA: Diagnosis not present

## 2021-09-27 DIAGNOSIS — Z95810 Presence of automatic (implantable) cardiac defibrillator: Secondary | ICD-10-CM

## 2021-09-29 ENCOUNTER — Telehealth: Payer: Self-pay

## 2021-09-29 NOTE — Telephone Encounter (Signed)
Remote ICM transmission received.  Attempted call to patient regarding ICM remote transmission and left detailed message per DPR.  Advised to return call for any fluid symptoms or questions. Next ICM remote transmission scheduled 11/01/2021.

## 2021-09-29 NOTE — Progress Notes (Signed)
EPIC Encounter for ICM Monitoring  Patient Name: LIPA KNAUFF is a 71 y.o. male Date: 09/29/2021 Primary Care Physican: Rory Percy, MD Primary Cardiologist: Domenic Polite Electrophysiologist: Allred 06/25/2021 Office Weight: 227 lb     Attempted call to patient and unable to reach.  Left detailed message per DPR regarding transmission. Transmission reviewed.    Corvue Thoracic impedance suggesting normal fluid levels.   Prescribed:  Spironolactone 25 mg take 1 tablet daily.   Recommendations:  Left voice mail with ICM number and encouraged to call if experiencing any fluid symptoms.   Follow-up plan: ICM clinic phone appointment on 11/01/2021.   91 day device clinic remote transmission 10/04/2021.       EP/Cardiology Office visits:    06/24/2022 with Dr [name].   Copy of ICM check sent to [name].  3 month ICM trend: 09/27/2021.    12-14 Month ICM trend:     Rosalene Billings, RN 09/29/2021 4:59 PM

## 2021-11-01 ENCOUNTER — Ambulatory Visit (INDEPENDENT_AMBULATORY_CARE_PROVIDER_SITE_OTHER): Payer: BC Managed Care – PPO

## 2021-11-01 DIAGNOSIS — Z95810 Presence of automatic (implantable) cardiac defibrillator: Secondary | ICD-10-CM

## 2021-11-01 DIAGNOSIS — I5022 Chronic systolic (congestive) heart failure: Secondary | ICD-10-CM | POA: Diagnosis not present

## 2021-11-02 ENCOUNTER — Ambulatory Visit (INDEPENDENT_AMBULATORY_CARE_PROVIDER_SITE_OTHER): Payer: BC Managed Care – PPO | Admitting: *Deleted

## 2021-11-02 ENCOUNTER — Other Ambulatory Visit: Payer: Self-pay

## 2021-11-02 DIAGNOSIS — I480 Paroxysmal atrial fibrillation: Secondary | ICD-10-CM | POA: Diagnosis not present

## 2021-11-02 DIAGNOSIS — Z5181 Encounter for therapeutic drug level monitoring: Secondary | ICD-10-CM | POA: Diagnosis not present

## 2021-11-02 DIAGNOSIS — I4891 Unspecified atrial fibrillation: Secondary | ICD-10-CM

## 2021-11-02 LAB — POCT INR: INR: 1.9 — AB (ref 2.0–3.0)

## 2021-11-02 NOTE — Patient Instructions (Signed)
Take warfarin 1 1/2 tablets tonight then resume 1/2 tablet daily except 1 tablet on Tuesdays and Friday.  Recheck in 6 weeks. Continue greens Pt has decided to stay on warfarin for now.

## 2021-11-03 NOTE — Progress Notes (Signed)
EPIC Encounter for ICM Monitoring  Patient Name: [name] is a 71 y.o. male Date: 11/03/2021 Primary Care Physican: Rory Percy, MD Primary Cardiologist: Domenic Polite Electrophysiologist: Allred 11/03/2021 Weight: 219 lbs     Spoke with patient and heart failure questions reviewed.  Pt asymptomatic for fluid accumulation.  Reports feeling well at this time and voices no complaints.    Corvue Thoracic impedance suggesting normal fluid levels.   Prescribed:  Spironolactone 25 mg take 0.5 tablet (12.5 mg total) daily. Jardiance 10 mg take 1 tablet by mouth before breakfast   Recommendations:  No changes and encouraged to call if experiencing any fluid symptoms.   Follow-up plan: ICM clinic phone appointment on 12/06/2021.   91 day device clinic remote transmission 01/03/2022.       EP/Cardiology Office visits:  12/20/2021 with Dr Domenic Polite.   06/24/2022 with Dr [name].   Copy of ICM check sent to [name].  3 month ICM trend: 11/01/2021.    12-14 Month ICM trend:     Rosalene Billings, RN 11/03/2021 12:00 PM

## 2021-11-09 ENCOUNTER — Ambulatory Visit: Payer: BC Managed Care – PPO | Admitting: Dermatology

## 2021-12-06 ENCOUNTER — Ambulatory Visit (INDEPENDENT_AMBULATORY_CARE_PROVIDER_SITE_OTHER): Payer: BC Managed Care – PPO

## 2021-12-06 DIAGNOSIS — Z95810 Presence of automatic (implantable) cardiac defibrillator: Secondary | ICD-10-CM | POA: Diagnosis not present

## 2021-12-06 DIAGNOSIS — I5022 Chronic systolic (congestive) heart failure: Secondary | ICD-10-CM

## 2021-12-10 NOTE — Progress Notes (Signed)
EPIC Encounter for ICM Monitoring ? ?Patient Name: [name] is a 71 y.o. male ?Date: 12/10/2021 ?Primary Care Physican: Rory Percy, MD ?Primary Cardiologist: Domenic Polite ?Electrophysiologist: Allred ?11/03/2021 Weight: 219 lbs ?  ?  ?Spoke with patient and heart failure questions reviewed.  Pt asymptomatic for fluid accumulation.  Reports feeling well at this time and voices no complaints.  ?  ?Corvue Thoracic impedance suggesting normal fluid levels.   ?  ?Prescribed:  ?Spironolactone 25 mg take 0.5 tablet (12.5 mg total) daily. ?Jardiance 10 mg take 1 tablet by mouth before breakfast ?  ?Recommendations:  No changes and encouraged to call if experiencing any fluid symptoms. ?  ?Follow-up plan: ICM clinic phone appointment on 01/10/2022.   91 day device clinic remote transmission 01/03/2022.     ?  ?EP/Cardiology Office visits:  12/20/2021 with Dr Domenic Polite.   06/24/2022 with Dr [name]. ?  ?Copy of ICM check sent to [name]. ?   ?3 month ICM trend: 12/06/2021. ? ? ? ?12-14 Month ICM trend:  ? ? ? ?Rosalene Billings, RN ?12/10/2021 ?3:23 PM ? ?

## 2021-12-14 ENCOUNTER — Ambulatory Visit (INDEPENDENT_AMBULATORY_CARE_PROVIDER_SITE_OTHER): Payer: BC Managed Care – PPO | Admitting: *Deleted

## 2021-12-14 DIAGNOSIS — I4891 Unspecified atrial fibrillation: Secondary | ICD-10-CM | POA: Diagnosis not present

## 2021-12-14 DIAGNOSIS — I480 Paroxysmal atrial fibrillation: Secondary | ICD-10-CM

## 2021-12-14 DIAGNOSIS — Z5181 Encounter for therapeutic drug level monitoring: Secondary | ICD-10-CM | POA: Diagnosis not present

## 2021-12-14 LAB — POCT INR: INR: 2.1 (ref 2.0–3.0)

## 2021-12-14 NOTE — Patient Instructions (Signed)
Continue warfarin 1/2 tablet daily except 1 tablet on Tuesdays and Friday.  ?Recheck in 6 weeks. ?Continue greens ?Pt has decided to stay on warfarin for now. ?

## 2021-12-17 NOTE — Progress Notes (Signed)
? ? ?Cardiology Office Note ? ?Date: 12/20/2021  ? ?ID: [name], DOB 06/12/1951, MRN [id number] ? ?PCP:  Lincoln Nation, MD  ?Cardiologist:  Rozann Lesches, MD ?Electrophysiologist:  Thompson Grayer, MD  ? ?Chief Complaint  ?Patient presents with  ? Cardiac follow-up  ? ? ?History of Present Illness: ?[name] is a 71 y.o. male last seen in September 2022.  He is here for a routine visit.  No significant change in stamina, NYHA class II dyspnea, enjoys spending time with his grandchildren and their sporting events. ? ?St. 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.  Most recent device check showed normal thoracic impedance.  He has had no device shocks or syncope. ? ?He remains on Coumadin with follow-up in anticoagulation clinic.  Recent INR 2.1.  No spontaneous bleeding problems. ? ?I reviewed his medications which are outlined below.  He is tolerating addition of Jardiance.  Is actually lost about 4 to 5 pounds. ? ?Follow-up echocardiogram in October 2022 revealed LVEF 25 to 30% range. ? ?I personally reviewed his ECG today which shows rate controlled atrial fibrillation with old anterior infarct pattern. ? ?He has OSA as well, has had significant difficulty tolerating mask however.  He has not had a recent follow-up visit with sleep medicine. ? ?Past Medical History:  ?Diagnosis Date  ? AICD (automatic cardioverter/defibrillator) present 2006  ? ICD  ? Anterior myocardial infarction [facility])   ? BCC (basal cell carcinoma) superficial 11/22/2016  ? Left back sholder  ? Chronic systolic heart failure ([place])   ? Coronary atherosclerosis of native coronary artery   ? a. s/p BMS to LAD and OM1 b. patent stents by cath in 2013  ? History of hiatal hernia   ? Hypertension   ? Inducible ventricular tachycardia ([place])   ? Ischemic cardiomyopathy   ? LVEF 30%, status post ICD  ? OSA (obstructive sleep apnea)   ? unable to tolerate CPAP  ? Paroxysmal atrial fibrillation ([facility])   ? On coumadin  ? Pseudoaneurysm  ([place])   ? SCC (squamous cell carcinoma) 08/04/2006  ? right shoulder (CX35FU)  ? SCC (squamous cell carcinoma) well differentiated 12/13/2011  ? right temple  ? Squamous cell carcinoma in situ (SCCIS) 08/04/2006  ? right sholder  ? Squamous cell carcinoma of skin 11/09/2020  ? in situ- left upper back  ? Squamous cell carcinoma of skin 11/09/2020  ? KA-right dorsal hand  ? Stroke [facility])   ? ? ?Past Surgical History:  ?Procedure Laterality Date  ? AICD implantation  2011  ? St Jude ICD implanted for primary prevention of sudden death  ? BIOPSY  05-20-20  ? Procedure: BIOPSY;  Surgeon: [name], MD;  [facility];  Service: Endoscopy;;  ? CATARACT EXTRACTION W/PHACO Left 03/27/2017  ? Procedure: CATARACT EXTRACTION PHACO AND INTRAOCULAR LENS PLACEMENT ([facility]);  Surgeon: [name], MD;  Location: [facility];  Service: Ophthalmology;  Laterality: Left;  CDE: 8.48  ? CATARACT EXTRACTION W/PHACO Right 05/29/2017  ? Procedure: CATARACT EXTRACTION PHACO AND INTRAOCULAR LENS PLACEMENT ([facility]);  Surgeon: [name], MD;  Location: [facility];  Service: Ophthalmology;  Laterality: Right;  CDE: 7.48  ? COLONOSCOPY N/A 05/20/2020  ? Scattered medium-mouthed diverticula in sigmoid, three sessile polyps in hepatic flexure and cecum. 5-7 mm in size. Tubular adenomas. Benign colonic mucosa. 3 year surveillance.   ? EYE SURGERY    ? ICD GENERATOR CHANGEOUT N/A 01/19/2017  ? SJM Ellipse VR ICD  generator change by Dr [name]  ? LAPAROSCOPY N/A 10/23/2017  ? Procedure: LAPAROSCOPY DIAGNOSTIC;  Surgeon: [name], MD;  Location: [place];  Service: General;  Laterality: N/A;  ? LEFT HEART CATH AND CORONARY ANGIOGRAPHY N/A 06/21/2018  ? Procedure: LEFT HEART CATH AND CORONARY ANGIOGRAPHY;  Surgeon: [name], MD;  Location: [place] CV LAB;  Service: Cardiovascular;  Laterality: N/A;  ? POLYPECTOMY  04/21/2020  ? Procedure: POLYPECTOMY;  Surgeon: [name], MD;  [facility];  Service: Endoscopy;;  ? VENTRAL  HERNIA REPAIR N/A 10/23/2017  ? Procedure: VENTRAL HERNIA REPAIR;  Surgeon: [name], MD;  Location: [place];  Service: General;  Laterality: N/A;  ? ? ?Current Outpatient Medications  ?Medication Sig Dispense Refill  ? aspirin 81 MG tablet Take 81 mg by mouth daily.    ? bisoprolol (ZEBETA) 5 MG tablet TAKE 1 TABLET (5 MG TOTAL) BY MOUTH DAILY. 90 tablet 2  ? empagliflozin (JARDIANCE) 10 MG TABS tablet Take 1 tablet (10 mg total) by mouth daily before breakfast. 30 tablet 11  ? ENTRESTO 24-26 MG TAKE 1 TABLET BY MOUTH TWICE A DAY 180 tablet 3  ? finasteride (PROSCAR) 5 MG tablet TAKE 1 TABLET BY MOUTH EVERY DAY 90 tablet 3  ? lipase/protease/amylase (CREON) 12000-38000 units CPEP capsule Take by mouth.    ? lipase/protease/amylase (CREON) 36000 UNITS CPEP capsule Take 2 capsules (72,000 Units total) by mouth 3 (three) times daily with meals. May also take 1 capsule (36,000 Units total) as needed (with snacks). 240 capsule 11  ? nitroGLYCERIN (NITROSTAT) 0.4 MG SL tablet Place 1 tablet (0.4 mg total) under the tongue every 5 (five) minutes as needed for chest pain. 25 tablet 3  ? rosuvastatin (CRESTOR) 20 MG tablet TAKE 1 TABLET (20 MG TOTAL) BY MOUTH DAILY. 90 tablet 3  ? spironolactone (ALDACTONE) 25 MG tablet Take 0.5 tablets (12.5 mg total) by mouth daily. 45 tablet 3  ? warfarin (COUMADIN) 5 MG tablet TAKE 1/2 TABLET DAILY EXCEPT 1 TABLET ON SUNDAYS, TUESDAYS AND THURSDAYS OR AS DIRECTED 90 tablet 3  ? Wheat Dextrin (BENEFIBER ON THE GO PO) Take by mouth daily.    ? ?No current facility-administered medications for this visit.  ? ?Allergies:  Patient has no known allergies.  ? ?ROS: No orthopnea or PND.  No leg swelling. ? ?Physical Exam: ?VS:  BP 116/72   Pulse 81   Ht '6\' 1"'$  (1.854 m)   Wt 225 lb 12.8 oz (102.4 kg)   SpO2 97%   BMI 29.79 kg/m? , BMI Body mass index is 29.79 kg/m?. ? ?Wt Readings from Last 3 Encounters:  ?12/20/21 225 lb 12.8 oz (102.4 kg)  ?06/25/21 227 lb (103 kg)  ?06/22/21 229 lb  6.4 oz (104.1 kg)  ?  ?General: Patient appears comfortable at rest. ?HEENT: Conjunctiva and lids normal, oropharynx clear. ?Neck: Supple, no elevated JVP or carotid bruits, no thyromegaly. ?Lungs: Clear to auscultation, nonlabored breathing at rest. ?Cardiac: Irregularly irregular, no S3 or significant systolic murmur, no pericardial rub. ?Extremities: No pitting edema. ? ?ECG:  An ECG dated 12/09/2020 was personally reviewed today and demonstrated:  Atrial fibrillation with leftward axis and low voltage, old anterior infarct pattern. ? ?Recent Labwork: ?06/17/2021: BUN 22; Creatinine, Ser 1.17; Potassium 4.4; Sodium 138  ? ?Other Studies Reviewed Today: ? ?Echocardiogram 07/16/2021: ? 1. Left ventricular ejection fraction, by estimation, is 25 to 30%. The  ?left ventricle has severely decreased function. Left ventricular  ?endocardial  border not optimally defined to evaluate regional wall motion.  ?There is mild left ventricular  ?hypertrophy. Left ventricular diastolic parameters are indeterminate.  ? 2. Right ventricular systolic function is normal. The right ventricular  ?size is normal.  ? 3. Left atrial size was moderately dilated.  ? 4. The mitral valve is normal in structure. Mild mitral valve  ?regurgitation. No evidence of mitral stenosis.  ? 5. The aortic valve was not well visualized. Aortic valve regurgitation  ?is not visualized. No aortic stenosis is present.  ? ?Assessment and Plan: ? ?1.  HFrEF with ischemic cardiomyopathy, LVEF 25 to 30% range as of October 2022.  He is clinically stable with NYHA class II dyspnea.  Due for follow-up lab work at physical per PCP in May.  He is currently on Zebeta, Entresto, Aldactone, and most recently [place].  No changes made today.  We will reassess with echocardiogram for his next visit. ? ?2.  Persistent atrial fibrillation with CHA2DS2-VASc score of 6.  He prefers to stay on Coumadin (have discussed DOACs over time).  Heart rate controlled today. ? ?3.  OSA,  has not been able to tolerate CPAP mask.  Plan to get him reevaluated in our sleep medicine clinic to discuss candidacy for other treatment options. ? ?4.  CAD status post previous anterior wall infarct.

## 2021-12-20 ENCOUNTER — Ambulatory Visit: Payer: BC Managed Care – PPO | Admitting: Cardiology

## 2021-12-20 ENCOUNTER — Encounter: Payer: Self-pay | Admitting: Cardiology

## 2021-12-20 VITALS — BP 116/72 | HR 81 | Ht 73.0 in | Wt 225.8 lb

## 2021-12-20 DIAGNOSIS — G4733 Obstructive sleep apnea (adult) (pediatric): Secondary | ICD-10-CM

## 2021-12-20 DIAGNOSIS — I5020 Unspecified systolic (congestive) heart failure: Secondary | ICD-10-CM

## 2021-12-20 DIAGNOSIS — I4819 Other persistent atrial fibrillation: Secondary | ICD-10-CM

## 2021-12-20 DIAGNOSIS — Z9989 Dependence on other enabling machines and devices: Secondary | ICD-10-CM

## 2021-12-20 DIAGNOSIS — I25119 Atherosclerotic heart disease of native coronary artery with unspecified angina pectoris: Secondary | ICD-10-CM | POA: Diagnosis not present

## 2021-12-20 DIAGNOSIS — I255 Ischemic cardiomyopathy: Secondary | ICD-10-CM | POA: Diagnosis not present

## 2021-12-20 NOTE — Patient Instructions (Addendum)
Testing/Procedures: °Your physician has requested that you have an echocardiogram. Echocardiography is a painless test that uses sound waves to create images of your heart. It provides your doctor with information about the size and shape of your heart and how well your heart’s chambers and valves are working. This procedure takes approximately one hour. There are no restrictions for this procedure. ° ° °Follow-Up: °Follow up with Dr. McDowell in 6 months ° °Any Other Special Instructions Will Be Listed Below (If Applicable). ° ° ° ° °If you need a refill on your cardiac medications before your next appointment, please call your pharmacy. ° °

## 2021-12-23 ENCOUNTER — Encounter: Payer: Self-pay | Admitting: Gastroenterology

## 2021-12-23 ENCOUNTER — Ambulatory Visit: Payer: BC Managed Care – PPO | Admitting: Gastroenterology

## 2021-12-23 VITALS — BP 120/62 | HR 75 | Temp 97.1°F | Ht 73.0 in | Wt 226.0 lb

## 2021-12-23 DIAGNOSIS — K529 Noninfective gastroenteritis and colitis, unspecified: Secondary | ICD-10-CM | POA: Diagnosis not present

## 2021-12-23 MED ORDER — DICYCLOMINE HCL 10 MG PO CAPS: 10.0000 mg | ORAL_CAPSULE | Freq: Three times a day (TID) | ORAL | 0 refills | Status: DC

## 2021-12-23 MED ORDER — PANCRELIPASE (LIP-PROT-AMYL) 36000-114000 UNITS PO CPEP: ORAL_CAPSULE | ORAL | 11 refills | Status: DC

## 2021-12-23 NOTE — Progress Notes (Signed)
c ? ? ?Gastroenterology Office Note   ? ? ?Primary Care Physician:  Bibo Nation, MD  ?Primary Gastroenterologist: Dr. Gala Romney ? ? ?Chief Complaint  ? ?Chief Complaint  ?Patient presents with  ? Follow-up  ?  Patient is still having problems first thing in the morning. It levels out in the afternoon.  ? ? ? ?History of Present Illness  ? ?[name] is a 71 y.o. male presenting today in follow-up with a history of postprandial formed stool with urgency, recommended to take fiber, with colonoscopy completed in Aug 2021 with scattered medium-mouthed diverticula in sigmoid, three sessile polyps in hepatic flexure and cecum. 5-7 mm in size. Tubular adenomas. Benign colonic mucosa. 3 year surveillance.  ? ?No significant improvement with Xifaxan. Typically has onset in morning that tapers throughout the day. Provided creon at last visit. Considering dicyclomine. Celiac serologies negative.  ? ?In the morning will have a BM, then diarrhea following. Worsened with eating. Less severe on Creon. No abdominal pain. No weight loss. No overt GI bleeding. He can eat pancakes without having frequent stools triggered.  ? ? ? ? ?Past Medical History:  ?Diagnosis Date  ? AICD (automatic cardioverter/defibrillator) present 2006  ? ICD  ? Anterior myocardial infarction [facility])   ? BCC (basal cell carcinoma) superficial 11/22/2016  ? Left back sholder  ? Chronic systolic heart failure ([place])   ? Coronary atherosclerosis of native coronary artery   ? a. s/p BMS to LAD and OM1 b. patent stents by cath in 2013  ? History of hiatal hernia   ? Hypertension   ? 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 Ischemic cardiomyopathy   ? LVEF 30%, status post ICD  ? OSA (obstructive sleep apnea)   ? unable to tolerate CPAP  ? Paroxysmal atrial fibrillation ([facility])   ? On coumadin  ? Pseudoaneurysm ([place])   ? SCC (squamous cell carcinoma) 08/04/2006  ? right shoulder (CX35FU)  ? SCC (squamous cell carcinoma) well differentiated 12/13/2011  ?  right temple  ? Squamous cell carcinoma in situ (SCCIS) 08/04/2006  ? right sholder  ? Squamous cell carcinoma of skin 11/09/2020  ? in situ- left upper back  ? Squamous cell carcinoma of skin 11/09/2020  ? KA-right dorsal hand  ? Stroke [facility])   ? ? ?Past Surgical History:  ?Procedure Laterality Date  ? AICD implantation  2011  ? St Jude ICD implanted for primary prevention of sudden death  ? BIOPSY  2020-05-11  ? Procedure: BIOPSY;  Surgeon: [name], MD;  [facility];  Service: Endoscopy;;  ? CATARACT EXTRACTION W/PHACO Left 03/27/2017  ? Procedure: CATARACT EXTRACTION PHACO AND INTRAOCULAR LENS PLACEMENT (IOC);  Surgeon: [name], MD;  Location: [facility] ORS;  Service: Ophthalmology;  Laterality: Left;  CDE: 8.48  ? CATARACT EXTRACTION W/PHACO Right 05/29/2017  ? Procedure: CATARACT EXTRACTION PHACO AND INTRAOCULAR LENS PLACEMENT (IOC);  Surgeon: [name], MD;  Location: [facility] ORS;  Service: Ophthalmology;  Laterality: Right;  CDE: 7.48  ? COLONOSCOPY N/A 05-11-20  ? Scattered medium-mouthed diverticula in sigmoid, three sessile polyps in hepatic flexure and cecum. 5-7 mm in size. Tubular adenomas. Benign colonic mucosa. 3 year surveillance.   ? EYE SURGERY    ? ICD GENERATOR CHANGEOUT N/A 01/19/2017  ? [facility] [place] VR ICD generator change by Dr [name]  ? LAPAROSCOPY N/A 10/23/2017  ? Procedure: LAPAROSCOPY DIAGNOSTIC;  Surgeon: [name], MD;  Location: [facility];  Service: General;  Laterality: N/A;  ?  LEFT HEART CATH AND CORONARY ANGIOGRAPHY N/A 06/21/2018  ? Procedure: LEFT HEART CATH AND CORONARY ANGIOGRAPHY;  Surgeon: [name], MD;  Location: [place] [facility];  Service: Cardiovascular;  Laterality: N/A;  ? POLYPECTOMY  04/21/2020  ? Procedure: POLYPECTOMY;  Surgeon: [name], MD;  [facility];  Service: Endoscopy;;  ? VENTRAL HERNIA REPAIR N/A 10/23/2017  ? Procedure: VENTRAL HERNIA REPAIR;  Surgeon: [name], MD;  Location: [place];  Service: General;  Laterality:  N/A;  ? ? ?Current Outpatient Medications  ?Medication Sig Dispense Refill  ? aspirin 81 MG tablet Take 81 mg by mouth daily.    ? bisoprolol (ZEBETA) 5 MG tablet TAKE 1 TABLET (5 MG TOTAL) BY MOUTH DAILY. 90 tablet 2  ? dicyclomine (BENTYL) 10 MG capsule Take 1 capsule (10 mg total) by mouth 4 (four) times daily -  before meals and at bedtime. For frequent stools. Start off with just twice a day. 90 capsule 0  ? empagliflozin (JARDIANCE) 10 MG TABS tablet Take 1 tablet (10 mg total) by mouth daily before breakfast. 30 tablet 11  ? ENTRESTO 24-26 MG TAKE 1 TABLET BY MOUTH TWICE A DAY 180 tablet 3  ? finasteride (PROSCAR) 5 MG tablet TAKE 1 TABLET BY MOUTH EVERY DAY 90 tablet 3  ? nitroGLYCERIN (NITROSTAT) 0.4 MG SL tablet Place 1 tablet (0.4 mg total) under the tongue every 5 (five) minutes as needed for chest pain. 25 tablet 3  ? rosuvastatin (CRESTOR) 20 MG tablet TAKE 1 TABLET (20 MG TOTAL) BY MOUTH DAILY. 90 tablet 3  ? spironolactone (ALDACTONE) 25 MG tablet Take 0.5 tablets (12.5 mg total) by mouth daily. 45 tablet 3  ? warfarin (COUMADIN) 5 MG tablet TAKE 1/2 TABLET DAILY EXCEPT 1 TABLET ON SUNDAYS, TUESDAYS AND THURSDAYS OR AS DIRECTED 90 tablet 3  ? Wheat Dextrin (BENEFIBER ON THE GO PO) Take by mouth daily.    ? lipase/protease/amylase (CREON) 36000 UNITS CPEP capsule Take 2 capsules (72,000 Units total) by mouth 3 (three) times daily with meals. May also take 1 capsule (36,000 Units total) as needed (with snacks). 240 capsule 11  ? ?No current facility-administered medications for this visit.  ? ? ?Allergies as of 12/23/2021  ? (No Known Allergies)  ? ? ?Family History  ?Problem Relation Age of Onset  ? Cancer Mother   ?     lymphoma   ? Heart attack Father   ? Heart disease Brother   ? Colon cancer Neg Hx   ? Colon polyps Neg Hx   ? ? ?Social History  ? ?Socioeconomic History  ? Marital status: Married  ?  Spouse name: Not on file  ? Number of children: Not on file  ? Years of education: Not on file  ?  Highest education level: Not on file  ?Occupational History  ? 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 Smoking status: Never  ? Smokeless tobacco: Never  ?Vaping Use  ? Vaping Use: Never used  ?Substance and Sexual Activity  ? Alcohol use: Yes  ?  Comment: occasional "moderate"  ? Drug use: No  ? Sexual activity: Yes  ?Other Topics Concern  ? Not on file  ?Social History Narrative  ? Not on file  ? ?Social Determinants of Health  ? ?Financial Resource Strain: Not on file  ?Food Insecurity: Not on file  ?Transportation Needs: Not on file  ?Physical Activity: Not on file  ?Stress: Not  on file  ?Social Connections: Not on file  ?Intimate Partner Violence: Not on file  ? ? ? ?Review of Systems  ? ?Gen: Denies any fever, chills, fatigue, weight loss, lack of appetite.  ?CV: Denies chest pain, heart palpitations, peripheral edema, syncope.  ?Resp: Denies shortness of breath at rest or with exertion. Denies wheezing or cough.  ?GI: see HPI ?GU : Denies urinary burning, urinary frequency, urinary hesitancy ?MS: Denies joint pain, muscle weakness, cramps, or limitation of movement.  ?Derm: Denies rash, itching, dry skin ?Psych: Denies depression, anxiety, memory loss, and confusion ?Heme: Denies bruising, bleeding, and enlarged lymph nodes. ? ? ?Physical Exam  ? ?BP 120/62 (BP Location: Left Arm, Patient Position: Sitting, Cuff Size: Normal)   Pulse 75   Temp (!) 97.1 ?F (36.2 ?C) (Temporal)   Ht '6\' 1"'$  (1.854 m)   Wt 226 lb (102.5 kg)   SpO2 97%   BMI 29.82 kg/m?  ?General:   Alert and oriented. Pleasant and cooperative. Well-nourished and well-developed.  ?Head:  Normocephalic and atraumatic. ?Eyes:  Without icterus ?Abdomen:  +BS, soft, non-tender and non-distended. No HSM noted. No guarding or rebound. No masses appreciated.  ?Rectal:  Deferred  ?Msk:  Symmetrical without gross deformities. Normal posture. ?Extremities:  Without edema. ?Neurologic:  Alert and  oriented x4;  grossly normal  neurologically. ?Skin:  Intact without significant lesions or rashes. ?Psych:  Alert and cooperative. Normal mood and affect. ? ? ?Assessment  ? ?[name] is a 71 y.o. male presenting today in follow-up with a

## 2021-12-23 NOTE — Patient Instructions (Signed)
I refilled Creon for you. ? ?I sent in dicyclomine (Bentyl) to take 30 minutes before breakfast to help with frequent stools. You can take up to 4 times a day, but I recommend only twice for right now. Side effects to monitor would be dry mouth, confusion, dizziness, and constipation. Stop if this were to occur. ? ?We will see you in 6 months! ? ?I enjoyed seeing you again today! As you know, I value our relationship and want to provide genuine, compassionate, and quality care. I welcome your feedback. If you receive a survey regarding your visit,  I greatly appreciate you taking time to fill this out. See you next time! ? ?Annitta Needs, PhD, ANP-BC ?[facility]  ? ?

## 2022-01-03 ENCOUNTER — Ambulatory Visit (INDEPENDENT_AMBULATORY_CARE_PROVIDER_SITE_OTHER): Payer: BC Managed Care – PPO

## 2022-01-03 DIAGNOSIS — I255 Ischemic cardiomyopathy: Secondary | ICD-10-CM | POA: Diagnosis not present

## 2022-01-04 LAB — CUP PACEART REMOTE DEVICE CHECK
Battery Remaining Longevity: 62 mo
Battery Remaining Percentage: 61 %
Battery Voltage: 2.96 V
Brady Statistic RV Percent Paced: 1 %
Date Time Interrogation Session: 20230417040021
HighPow Impedance: 46 Ohm
HighPow Impedance: 46 Ohm
Implantable Lead Implant Date: 20020111
Implantable Lead Location: 753860
Implantable Lead Model: 148
Implantable Lead Serial Number: 116740
Implantable Pulse Generator Implant Date: 20180503
Lead Channel Impedance Value: 650 Ohm
Lead Channel Pacing Threshold Amplitude: 1 V
Lead Channel Pacing Threshold Pulse Width: 0.5 ms
Lead Channel Sensing Intrinsic Amplitude: 12 mV
Lead Channel Setting Pacing Amplitude: 2.5 V
Lead Channel Setting Pacing Pulse Width: 0.5 ms
Lead Channel Setting Sensing Sensitivity: 0.5 mV
Pulse Gen Serial Number: 7421148

## 2022-01-10 ENCOUNTER — Ambulatory Visit (INDEPENDENT_AMBULATORY_CARE_PROVIDER_SITE_OTHER): Payer: BC Managed Care – PPO

## 2022-01-10 DIAGNOSIS — I5022 Chronic systolic (congestive) heart failure: Secondary | ICD-10-CM

## 2022-01-10 DIAGNOSIS — Z95810 Presence of automatic (implantable) cardiac defibrillator: Secondary | ICD-10-CM

## 2022-01-11 ENCOUNTER — Telehealth: Payer: Self-pay | Admitting: Gastroenterology

## 2022-01-11 NOTE — Telephone Encounter (Signed)
Can we get an update on how patient is doing on dicyclomine?  ?

## 2022-01-11 NOTE — Telephone Encounter (Signed)
Phoned and spoke with the pt and was advised by him he is doing good with the dicyclomine. He actually just started doing one a day instead of more and he stated he can tell a difference already. Overall one a day is helping him ?

## 2022-01-13 NOTE — Progress Notes (Signed)
EPIC Encounter for ICM Monitoring ? ?Patient Name: [name] is a 71 y.o. male ?Date: 01/13/2022 ?Primary Care Physican: [place] Nation, MD ?Primary Cardiologist: Domenic Polite ?Electrophysiologist: Allred ?01/13/2022 Weight: 220 lbs ?  ?  ?Spoke with patient and heart failure questions reviewed.  Pt asymptomatic for fluid accumulation.  Reports feeling well at this time and voices no complaints.  ?  ?Corvue Thoracic impedance suggesting normal fluid levels.   ?  ?Prescribed:  ?Spironolactone 25 mg take 0.5 tablet (12.5 mg total) daily. ?Jardiance 10 mg take 1 tablet by mouth before breakfast ?  ?Recommendations:  No changes and encouraged to call if experiencing any fluid symptoms. ?  ?Follow-up plan: ICM clinic phone appointment on 02/15/2022.   91 day device clinic remote transmission 04/04/2022.     ?  ?EP/Cardiology Office visits:     06/24/2022 with Dr [name]. ?  ?Copy of ICM check sent to [name]. ?   ?3 month ICM trend: 01/11/2022. ? ? ? ?12-14 Month ICM trend:  ? ? ? ?Rosalene Billings, RN ?01/13/2022 ?3:56 PM ? ?

## 2022-01-18 ENCOUNTER — Encounter: Payer: Self-pay | Admitting: Cardiology

## 2022-01-18 DIAGNOSIS — E7800 Pure hypercholesterolemia, unspecified: Secondary | ICD-10-CM | POA: Diagnosis not present

## 2022-01-18 DIAGNOSIS — N400 Enlarged prostate without lower urinary tract symptoms: Secondary | ICD-10-CM | POA: Diagnosis not present

## 2022-01-18 DIAGNOSIS — R739 Hyperglycemia, unspecified: Secondary | ICD-10-CM | POA: Diagnosis not present

## 2022-01-18 DIAGNOSIS — I5020 Unspecified systolic (congestive) heart failure: Secondary | ICD-10-CM | POA: Diagnosis not present

## 2022-01-18 DIAGNOSIS — Z0001 Encounter for general adult medical examination with abnormal findings: Secondary | ICD-10-CM | POA: Diagnosis not present

## 2022-01-20 NOTE — Progress Notes (Signed)
Remote ICD transmission.   

## 2022-01-25 ENCOUNTER — Ambulatory Visit (INDEPENDENT_AMBULATORY_CARE_PROVIDER_SITE_OTHER): Payer: BC Managed Care – PPO | Admitting: *Deleted

## 2022-01-25 DIAGNOSIS — I5020 Unspecified systolic (congestive) heart failure: Secondary | ICD-10-CM | POA: Diagnosis not present

## 2022-01-25 DIAGNOSIS — I480 Paroxysmal atrial fibrillation: Secondary | ICD-10-CM

## 2022-01-25 DIAGNOSIS — R351 Nocturia: Secondary | ICD-10-CM | POA: Diagnosis not present

## 2022-01-25 DIAGNOSIS — Z0001 Encounter for general adult medical examination with abnormal findings: Secondary | ICD-10-CM | POA: Diagnosis not present

## 2022-01-25 DIAGNOSIS — Z5181 Encounter for therapeutic drug level monitoring: Secondary | ICD-10-CM

## 2022-01-25 DIAGNOSIS — N401 Enlarged prostate with lower urinary tract symptoms: Secondary | ICD-10-CM | POA: Diagnosis not present

## 2022-01-25 DIAGNOSIS — I4891 Unspecified atrial fibrillation: Secondary | ICD-10-CM | POA: Diagnosis not present

## 2022-01-25 DIAGNOSIS — Z1389 Encounter for screening for other disorder: Secondary | ICD-10-CM | POA: Diagnosis not present

## 2022-01-25 DIAGNOSIS — Z1331 Encounter for screening for depression: Secondary | ICD-10-CM | POA: Diagnosis not present

## 2022-01-25 DIAGNOSIS — I2584 Coronary atherosclerosis due to calcified coronary lesion: Secondary | ICD-10-CM | POA: Diagnosis not present

## 2022-01-25 LAB — POCT INR: INR: 2 (ref 2.0–3.0)

## 2022-01-25 NOTE — Patient Instructions (Signed)
Increase warfarin to 1/2 tablet daily except 1 tablet on Tuesdays, Thursdays and Saturdays.  ?Recheck in 7 weeks. ?Continue greens ?Pt has decided to stay on warfarin for now. ?

## 2022-02-12 ENCOUNTER — Other Ambulatory Visit: Payer: Self-pay | Admitting: Cardiology

## 2022-02-15 ENCOUNTER — Ambulatory Visit (INDEPENDENT_AMBULATORY_CARE_PROVIDER_SITE_OTHER): Payer: BC Managed Care – PPO

## 2022-02-15 DIAGNOSIS — Z95810 Presence of automatic (implantable) cardiac defibrillator: Secondary | ICD-10-CM | POA: Diagnosis not present

## 2022-02-15 DIAGNOSIS — I5022 Chronic systolic (congestive) heart failure: Secondary | ICD-10-CM

## 2022-02-16 ENCOUNTER — Ambulatory Visit: Payer: BC Managed Care – PPO | Admitting: Cardiology

## 2022-02-16 LAB — CUP PACEART REMOTE DEVICE CHECK
Battery Remaining Longevity: 61 mo
Battery Remaining Percentage: 59 %
Battery Voltage: 2.96 V
Brady Statistic RV Percent Paced: 1 %
Date Time Interrogation Session: 20230531001252
HighPow Impedance: 51 Ohm
HighPow Impedance: 52 Ohm
Implantable Lead Implant Date: 20020111
Implantable Lead Location: 753860
Implantable Lead Model: 148
Implantable Lead Serial Number: 116740
Implantable Pulse Generator Implant Date: 20180503
Lead Channel Impedance Value: 660 Ohm
Lead Channel Pacing Threshold Amplitude: 1 V
Lead Channel Pacing Threshold Pulse Width: 0.5 ms
Lead Channel Sensing Intrinsic Amplitude: 12 mV
Lead Channel Setting Pacing Amplitude: 2.5 V
Lead Channel Setting Pacing Pulse Width: 0.5 ms
Lead Channel Setting Sensing Sensitivity: 0.5 mV
Pulse Gen Serial Number: 7421148

## 2022-02-16 NOTE — Progress Notes (Signed)
EPIC Encounter for ICM Monitoring  Patient Name: [name] is a 71 y.o. male Date: 02/16/2022 Primary Care Physican: [place] Nation, MD Primary Cardiologist: Domenic Polite Electrophysiologist: Allred 01/13/2022 Weight: 220 lbs     Spoke with patient and heart failure questions reviewed.  Pt asymptomatic for fluid accumulation.  Reports feeling well at this time and voices no complaints.    Corvue Thoracic impedance suggesting normal fluid levels.     Prescribed:  Spironolactone 25 mg take 0.5 tablet (12.5 mg total) daily. Jardiance 10 mg take 1 tablet by mouth before breakfast   Recommendations:  No changes and encouraged to call if experiencing any fluid symptoms.   Follow-up plan: ICM clinic phone appointment on 03/21/2022.   91 day device clinic remote transmission 04/04/2022.       EP/Cardiology Office visits:  06/24/2022 with Dr [name].   Copy of ICM check sent to [name].  3 month ICM trend: 02/16/2022.    12-14 Month ICM trend:     Rosalene Billings, RN 02/16/2022 5:12 PM

## 2022-02-17 ENCOUNTER — Ambulatory Visit (INDEPENDENT_AMBULATORY_CARE_PROVIDER_SITE_OTHER): Payer: Self-pay

## 2022-02-17 DIAGNOSIS — I255 Ischemic cardiomyopathy: Secondary | ICD-10-CM

## 2022-02-21 ENCOUNTER — Ambulatory Visit: Payer: BC Managed Care – PPO | Admitting: Cardiovascular Disease

## 2022-02-21 ENCOUNTER — Encounter: Payer: Self-pay | Admitting: Cardiovascular Disease

## 2022-02-21 DIAGNOSIS — Z95810 Presence of automatic (implantable) cardiac defibrillator: Secondary | ICD-10-CM

## 2022-02-21 DIAGNOSIS — I4819 Other persistent atrial fibrillation: Secondary | ICD-10-CM | POA: Diagnosis not present

## 2022-02-21 DIAGNOSIS — G4731 Primary central sleep apnea: Secondary | ICD-10-CM

## 2022-02-21 DIAGNOSIS — G4733 Obstructive sleep apnea (adult) (pediatric): Secondary | ICD-10-CM

## 2022-02-21 DIAGNOSIS — I255 Ischemic cardiomyopathy: Secondary | ICD-10-CM

## 2022-02-21 DIAGNOSIS — E782 Mixed hyperlipidemia: Secondary | ICD-10-CM | POA: Diagnosis not present

## 2022-02-21 NOTE — Patient Instructions (Signed)
Medication Instructions:  Your physician recommends that you continue on your current medications as directed. Please refer to the Current Medication list given to you today.  *If you need a refill on your cardiac medications before your next appointment, please call your pharmacy*  Follow-Up: At [facility], you and your health needs are our priority.  As part of our continuing mission to provide you with exceptional heart care, we have created designated Provider Care Teams.  These Care Teams include your primary Cardiologist (physician) and Advanced Practice Providers (APPs -  Physician Assistants and Nurse Practitioners) who all work together to provide you with the care you need, when you need it.  We recommend signing up for the patient portal called "MyChart".  Sign up information is provided on this After Visit Summary.  MyChart is used to connect with patients for Virtual Visits (Telemedicine).  Patients are able to view lab/test results, encounter notes, upcoming appointments, etc.  Non-urgent messages can be sent to your provider as well.   To learn more about what you can do with MyChart, go to [email or web address].    Your next appointment:   3 month(s)  The format for your next appointment:   In Person  Provider:   Dr. [name] (sleep)  Important Information About Sugar

## 2022-02-21 NOTE — Progress Notes (Signed)
Cardiology Office Note    Date:  03/05/2022   ID:  [name], DOB April 11, 1951, MRN [id number]  PCP:  [place] Nation, MD  Cardiologist:  Shelva Majestic, MD (sleep); Dr. Johnny Bridge  Reestablishment of sleep care   History of Present Illness:  [name] is a 71 y.o. male who presents to the office for sleep evaluation.  I  saw him in October 2018 and had not seen him in follow-up until the July 2022 when he reestablish care.  He has recently seen Dr. Domenic Polite who recommended follow-up sleep evaluation due to his current sleep issues.  [name] has a history of an ischemic cardiomyopathy, known CAD, is status post ICD therapy for reduced LV function.  An echo Doppler study in April 2018 showed an EF of 25 to 30%.  He reportedly had undergone a prior sleep evaluation at [facility] in 2014.  In 2018 he was found to have severe obstructive sleep apnea on reevaluation with an AHI of 68.7/h.  On his titration study he was transitioned to BiPAP therapy and was only titrated to a pressure of 12/6.  His AHI remains significantly elevated at 31.7/h and he also had moderate central apnea index of 8.5.  On the diagnostic portion of his study had significant oxygen desaturation to nadir 72% and there was evidence for loud snoring.  His ECG showed PVCs.  BiPAP auto therapy was recommended with a minimum EPAP pressure of 9 with pressure support of 5.  I saw him for initial sleep evaluation in August 2018.  At that time he had felt significantly improved since initiating BiPAP therapy but a download continue to show moderate events with an AHI of 19.8 and a central apnea index of 13.2.  He subsequently was transitioned to a ResMed air curve 10 ST auto unit and I saw him in follow-up on July 17, 2017.  At the time he was using nasal pillow mask.  He was having significant mask leak.  When I saw him in July 2022 I had not seen him since 2018.  He stopped using therapy approximately 3  years ago.  He is followed by Dr. Domenic Polite for his primary cardiology care and [name] for his EP.  When he was most recently seen by Dr. Domenic Polite it was recommended that he see me back for reassessment of his complex sleep apnea and reinitiation of treatment. At my July 2022 evaluation he was sleeping poorly and admitted to snoring, nocturia, and frequent awakenings.  Epworth Sleepiness Scale score was calculated in the office  and this endorsed at 7 arguing against significant excessive daytime sleepiness.  During that evaluation I had a very lengthy discussion with him concerning his untreated sleep apnea effects on blood pressure, potential nocturnal hypoxemia contributory arrhythmia and undoubtedly being a contributor to his permanent atrial fibrillation.  I also discussed potential for nocturnal ischemia particularly in the setting of nocturnal hypoxemia and discussed the importance of using treatment ideally for 7 to 8 hours.  [name] was recently evaluated by Dr. Domenic Polite and he relayed to Dr. Domenic Polite that he was having significant difficulty tolerating the mask and as result has not been using therapy.  Presently, he admits to nocturia at least 3-4 times per night.  He typically goes to bed between 11:30 and midnight and wakes up at 6 AM.  He does not sleep well.  He admits to snoring, he has been told that he stops breathing during sleep.  He has awakened gasping for breath during the night.  His sleep is nonrestorative.  He feels tired during the day however his Epworth Sleepiness Scale score is calculated in the office today which endorsed at 5.  He continues to be on bisoprolol 5 mg daily, Jardiance 10 mg daily, Entresto 24/26 mg twice a day, spironolactone 25 mg in addition to warfarin for anticoagulation and rosuvastatin for hyperlipidemia.  He presents for reevaluation.  Past Medical History:  Diagnosis Date   AICD (automatic cardioverter/defibrillator) present 2006   ICD   Anterior  myocardial infarction ([place])    BCC (basal cell carcinoma) superficial 11/22/2016   Left back sholder   Chronic systolic heart failure ([place])    Coronary atherosclerosis of native coronary artery    a. s/p BMS to LAD and OM1 b. 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[place])    Ischemic cardiomyopathy    LVEF 30%, status post ICD   OSA (obstructive sleep apnea)    unable to tolerate CPAP   Paroxysmal atrial fibrillation (HCC)    On coumadin   Pseudoaneurysm (HCC)    SCC (squamous cell carcinoma) 08/04/2006   right shoulder ([id number])   SCC (squamous cell carcinoma) well differentiated 12/13/2011   right temple   Squamous cell carcinoma in situ (SCCIS) 08/04/2006   right sholder   Squamous cell carcinoma of skin 11/09/2020   in situ- left upper back   Squamous cell carcinoma of skin 11/09/2020   KA-right dorsal hand   Stroke [facility])     Past Surgical History:  Procedure Laterality Date   AICD implantation  2011   St Jude ICD implanted for primary prevention of sudden death   BIOPSY  05-11-20   Procedure: BIOPSY;  Surgeon: [name], MD;  [facility];  Service: Endoscopy;;   CATARACT EXTRACTION W/PHACO Left 03/27/2017   Procedure: CATARACT EXTRACTION PHACO AND INTRAOCULAR LENS PLACEMENT ([place]);  Surgeon: [name], MD;  Location: [facility] ORS;  Service: Ophthalmology;  Laterality: Left;  CDE: 8.48   CATARACT EXTRACTION W/PHACO Right 05/29/2017   Procedure: CATARACT EXTRACTION PHACO AND INTRAOCULAR LENS PLACEMENT ([facility]);  Surgeon: [name], MD;  Location: [facility] ORS;  Service: Ophthalmology;  Laterality: Right;  CDE: 7.48   COLONOSCOPY N/A 05/11/20   Scattered medium-mouthed diverticula in sigmoid, three sessile polyps in hepatic flexure and cecum. 5-7 mm in size. Tubular adenomas. Benign colonic mucosa. 3 year surveillance.    EYE SURGERY     ICD GENERATOR CHANGEOUT N/A 01/19/2017   SJM Ellipse VR ICD  generator change by Dr [name]   LAPAROSCOPY N/A 10/23/2017   Procedure: LAPAROSCOPY DIAGNOSTIC;  Surgeon: [name], MD;  Location: [place];  Service: General;  Laterality: N/A;   LEFT HEART CATH AND CORONARY ANGIOGRAPHY N/A 06/21/2018   Procedure: LEFT HEART CATH AND CORONARY ANGIOGRAPHY;  Surgeon: [name], MD;  Location: [place] CV LAB;  Service: Cardiovascular;  Laterality: N/A;   POLYPECTOMY  2020-05-11   Procedure: POLYPECTOMY;  Surgeon: [name], MD;  [facility];  Service: Endoscopy;;   VENTRAL HERNIA REPAIR N/A 10/23/2017   Procedure: VENTRAL HERNIA REPAIR;  Surgeon: [name], MD;  Location: [place];  Service: General;  Laterality: N/A;    Current Medications: Outpatient Medications Prior to Visit  Medication Sig Dispense Refill   aspirin 81 MG tablet Take 81 mg by mouth daily.  bisoprolol (ZEBETA) 5 MG tablet TAKE 1 TABLET (5 MG TOTAL) BY MOUTH DAILY. 90 tablet 2   dicyclomine (BENTYL) 10 MG capsule Take 1 capsule (10 mg total) by mouth 4 (four) times daily -  before meals and at bedtime. For frequent stools. Start off with just twice a day. 90 capsule 0   empagliflozin (JARDIANCE) 10 MG TABS tablet Take 1 tablet (10 mg total) by mouth daily before breakfast. 30 tablet 11   ENTRESTO 24-26 MG TAKE 1 TABLET BY MOUTH TWICE A DAY 180 tablet 3   finasteride (PROSCAR) 5 MG tablet TAKE 1 TABLET BY MOUTH EVERY DAY 90 tablet 3   lipase/protease/amylase (CREON) 36000 UNITS CPEP capsule Take 2 capsules (72,000 Units total) by mouth 3 (three) times daily with meals. May also take 1 capsule (36,000 Units total) as needed (with snacks). 240 capsule 11   nitroGLYCERIN (NITROSTAT) 0.4 MG SL tablet Place 1 tablet (0.4 mg total) under the tongue every 5 (five) minutes as needed for chest pain. 25 tablet 3   rosuvastatin (CRESTOR) 20 MG tablet TAKE 1 TABLET (20 MG TOTAL) BY MOUTH DAILY. 90 tablet 3   spironolactone (ALDACTONE) 25 MG tablet TAKE 1 TABLET BY  MOUTH EVERY DAY 90 tablet 1   warfarin (COUMADIN) 5 MG tablet TAKE 1/2 TABLET DAILY EXCEPT 1 TABLET ON SUNDAYS, TUESDAYS AND THURSDAYS OR AS DIRECTED 90 tablet 3   Wheat Dextrin (BENEFIBER ON THE GO PO) Take by mouth daily. (Patient not taking: Reported on 02/21/2022)     No facility-administered medications prior to visit.     Allergies:   Patient has no known allergies.   Social History   Socioeconomic History   Marital status: Married    Spouse name: Not on file   Number of children: Not on file   Years of education: Not on file   Highest education level: Not on file  Occupational History   Occupation: retired    Comment: Banking  Tobacco Use   Smoking status: Never   Smokeless tobacco: Never  Vaping Use   Vaping Use: Never used  Substance and Sexual Activity   Alcohol use: Yes    Comment: occasional "moderate"   Drug use: No   Sexual activity: Yes  Other Topics Concern   Not on file  Social History Narrative   Not on file   Social Determinants of Health   Financial Resource Strain: Not on file  Food Insecurity: Not on file  Transportation Needs: Not on file  Physical Activity: Not on file  Stress: Not on file  Social Connections: Not on file    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  He had worked at [employer] which was subsequently bought [employer].  He has 6 grandchildren.  Family History:  The patient's family history includes Cancer in his mother; Heart attack in his father; Heart disease in his brother.   ROS General: Negative; No fevers, chills, or night sweats;  HEENT: Negative; No changes in vision or hearing, sinus congestion, difficulty swallowing Pulmonary: Negative; No cough, wheezing, shortness of breath, hemoptysis Cardiovascular: Negative; No chest pain, presyncope, syncope, palpitations GI: Negative; No nausea, vomiting, diarrhea, or abdominal pain GU: Negative; No dysuria, hematuria, or difficulty voiding Musculoskeletal:  Negative; no myalgias, joint pain, or weakness Hematologic/Oncology: Negative; no easy bruising, bleeding Endocrine: Negative; no heat/cold intolerance; no diabetes Neuro: Negative; no changes in balance, headaches Skin: Negative; No rashes or skin lesions Psychiatric: Negative; No behavioral problems, depression Sleep: Complex severe  sleep apnea sleep apnea with  snoring and previous documented significant nocturnal hypoxemia and nocturia; no bruxism, restless legs, hypnogognic hallucinations, no cataplexy Other comprehensive 14 point system review is negative.   PHYSICAL EXAM:   VS:  BP 96/60 (BP Location: Left Arm)   Pulse 69   Ht _0  (1.854 m)   Wt 227 lb 9.6 oz (103.2 kg)   SpO2 96%   BMI 30.03 kg/m     Repeat blood pressure by me was 104/64.  Wt Readings from Last 3 Encounters:  02/21/22 227 lb 9.6 oz (103.2 kg)  12/23/21 226 lb (102.5 kg)  12/20/21 225 lb 12.8 oz (102.4 kg)    General: Alert, oriented, no distress.  Skin: normal turgor, no rashes, warm and dry HEENT: Normocephalic, atraumatic. Pupils equal round and reactive to light; sclera anicteric; extraocular muscles intact;  Nose without nasal septal hypertrophy Mouth/Parynx benign; Mallinpatti scale 3/4 Neck: No JVD, no carotid bruits; normal carotid upstroke Lungs: clear to ausculatation and percussion; no wheezing or rales Chest wall: without tenderness to palpitation Heart: PMI not displaced, RRR, s1 s2 normal, 1/6 systolic murmur, no diastolic murmur, no rubs, gallops, thrills, or heaves Abdomen: soft, nontender; no hepatosplenomehaly, BS+; abdominal aorta nontender and not dilated by palpation. Back: no CVA tenderness Pulses 2+ Musculoskeletal: full range of motion, normal strength, no joint deformities Extremities: no clubbing cyanosis or edema, Homan's sign negative  Neurologic: grossly nonfocal; Cranial nerves grossly wnl Psychologic: Normal mood and affect   Studies/Labs Reviewed:   February 21, 2022  ECG (independently read by me): Atrial fibrillation at 69, LAD, Inferior Q waves, PRWP anteriorly  I personally reviewed his last ECG from December 09, 2020 which demonstrates atrial fibrillation at 71 bpm, QS complex V1 through V4 and Q waves in lead III consistent with prior anterior wall myocardial infarction.  Recent Labs:    Latest Ref Rng & Units 06/17/2021    1:57 PM 12/02/2019   10:22 AM 11/28/2018   12:15 PM  BMP  Glucose 70 - 99 mg/dL 93  103  99   BUN 8 - 23 mg/dL _1 Creatinine 0.61 - 1.24 mg/dL 1.17  1.26  1.21   BUN/Creat Ratio 10 - 24  16    Sodium 135 - 145 mmol/L 138  139  134   Potassium 3.5 - 5.1 mmol/L 4.4  4.7  4.4   Chloride 98 - 111 mmol/L 104  102  100   CO2 22 - 32 mmol/L _2 Calcium 8.9 - 10.3 mg/dL 9.6  9.5  9.3         Latest Ref Rng & Units 11/16/2018    1:44 PM 06/19/2018    3:25 AM  Hepatic Function  Total Protein 6.5 - 8.1 g/dL 7.2  7.1   Albumin 3.5 - 5.0 g/dL 3.0  3.8   AST 15 - 41 U/L 33  19   ALT 0 - 44 U/L 48  18   Alk Phosphatase 38 - 126 U/L 98  47   Total Bilirubin 0.3 - 1.2 mg/dL 1.0  1.4        Latest Ref Rng & Units 11/28/2018   12:15 PM 11/16/2018    1:44 PM 06/20/2018    2:36 PM  CBC  WBC 4.0 - 10.5 K/uL 7.1  9.0  11.0   Hemoglobin 13.0 - 17.0 g/dL 15.4  14.6  15.0   Hematocrit 39.0 - 52.0 %  46.0  45.4  46.2   Platelets 150.0 - 400.0 K/uL 304.0  300  131    Lab Results  Component Value Date   MCV 93.9 11/28/2018   MCV 94.8 11/16/2018   MCV 96.5 06/20/2018   Lab Results  Component Value Date   TSH 0.564 06/19/2018   No results found for: "HGBA1C"   BNP    Component Value Date/Time   BNP 362.7 (H) 06/19/2018 0325    ProBNP    Component Value Date/Time   PROBNP 817 (H) 12/02/2019 1022   PROBNP 209.0 (H) 11/28/2018 1215     Lipid Panel  No results found for: "CHOL", "TRIG", "HDL", "CHOLHDL", "VLDL", "LDLCALC", "LDLDIRECT", "LABVLDL"   RADIOLOGY: CUP PACEART REMOTE DEVICE CHECK  Result  Date: 02/16/2022 Scheduled remote reviewed. Normal device function.  Known AF, controlled rates, Warfarin Next remote 91 days. LA Scheduled remote reviewed. Normal device function.  Known AF, controlled rates, Warfarin Next remote 91 days. LA    Additional studies/ records that were reviewed today include:    01/20/2017 CLINICAL INFORMATION The patient is referred for a split night study with BPAP.   MEDICATIONS aspirin 81 MG tablet bisoprolol (ZEBETA) 5 MG tablet calcium carbonate (TUMS - DOSED IN MG ELEMENTAL CALCIUM) 500 MG chewable tablet finasteride (PROSCAR) 5 MG tablet losartan (COZAAR) 50 MG tablet nitroGLYCERIN (NITROSTAT) 0.4 MG SL tablet omega-3 acid ethyl esters (LOVAZA) 1 G capsule rosuvastatin (CRESTOR) 20 MG tablet spironolactone (ALDACTONE) 25 MG tablet warfarin (COUMADIN) 5 MG tablet   Medications self-administered by patient taken the night of the study : N/A   SLEEP STUDY TECHNIQUE As per the AASM Manual for the Scoring of Sleep and Associated Events v2.3 (April 2016) with a hypopnea requiring 4% desaturations.   The channels recorded and monitored were frontal, central and occipital EEG, electrooculogram (EOG), submentalis EMG (chin), nasal and oral airflow, thoracic and abdominal wall motion, anterior tibialis EMG, snore microphone, electrocardiogram, and pulse oximetry. Bi-level positive airway pressure (BiPAP) was initiated when the patient met split night criteria and was titrated according to treat sleep-disordered breathing.   RESPIRATORY PARAMETERS   Diagnostic Total AHI (/hr):            68.7     RDI (/hr):         71.2     OA Index (/hr):            24.9     CA Index (/hr):      8.5 REM AHI (/hr):            53.3     NREM AHI (/hr):          69.8     Supine AHI (/hr):         69.2     Non-supine AHI (/hr):        66.67 Min O2 Sat (%):          72.00   Mean O2 (%):  92.21   Time below 88% (min):           14.0                    Titration Optimal IPAP  Pressure (cm):             Optimal EPAP Pressure (cm):                        AHI at Optimal Pressure (/hr):  N/A      Min O2 at Optimal Pressure (%):       84.00 Sleep % at Optimal (%):         N/A      Supine % at Optimal (%):       N/A                                            SLEEP ARCHITECTURE The study was initiated at 9:49:40 PM and terminated at 5:03:38 AM. The total recorded time was 434.0 minutes. EEG confirmed total sleep time was 329.0 minutes yielding a sleep efficiency of 75.8%. Sleep onset after lights out was 33.3 minutes with a REM latency of 150.0 minutes. The patient spent 10.03% of the night in stage N1 sleep, 74.47% in stage N2 sleep, 0.00% in stage N3 and 15.50% in REM. Wake after sleep onset (WASO) was 71.7 minutes. The Arousal Index was 39.9/hour.   LEG MOVEMENT DATA The total Periodic Limb Movements of Sleep (PLMS) were 0. The PLMS index was 0.00 .   CARDIAC DATA The 2 lead EKG demonstrated sinus rhythm. The mean heart rate was 54.91 beats per minute. Other EKG findings include: PVCs.   IMPRESSIONS - Severe obstructive sleep apnea occurred during the diagnostic portion of the study (AHI = 68.7 /hour). An optimal BPAP pressure could not be selected for this patient based on the available study data. The patient was only titrated up to a BiPAP pressure of 12/6 and AHI remained elevated at 31.7/h with an oxygen nadir at 89%. - Moderate central sleep apnea occurred during the diagnostic portion of the study (CAI = 8.5/hour). - Moderate oxygen desaturation was noted during the diagnostic portion of the study to a nadir of 83% during NREM and 72% during REM sleep. - The patient snored with Loud snoring volume during the diagnostic portion of the study. - EKG findings include PVCs. - Clinically significant periodic limb movements of sleep did not occur during the study.   DIAGNOSIS - Obstructive Sleep Apnea (327.23 [G47.33 ICD-10])   RECOMMENDATIONS - Recommend an  initial trial of BiPAP Auto with and EPAP min of 7, PS of 5 and IPAP max of 25 with heated humidification. - Effort should be made to optimize nasal and oral pharyngeal patency. - Effort should be made to optimize the patient's volume status in this patient with a significant cardiomyopathy and central apneic events. - Avoid alcohol, sedatives and other CNS depressants that may worsen sleep apnea and disrupt normal sleep architecture. - Sleep hygiene should be reviewed to assess factors that may improve sleep quality. - Weight management and regular exercise should be initiated or continued. - Recommend a download be obtained in 2-4 weeks to assess initial and subsequent efficacy and plan for sleep clinic evaluation after 4 weeks of therapy.   ASSESSMENT:    1. Complex sleep apnea syndrome   2. Persistent atrial fibrillation ([place])   3. Ischemic cardiomyopathy   4. Mixed hyperlipidemia   5. Automatic implantable cardioverter-defibrillator in situ     PLAN:  [name] is a 71 year old gentleman who is retired from the Audiological [employer].  He has a history of an ischemic cardiomyopathy, CAD, and underwent ICD implantation for markedly reduced LV function.  An echo Doppler study in April 2018 showed an EF of 25 to 30%.  He has a history of  permanent atrial fibrillation and is followed by Dr. Johnny Bridge for primary cardiology care and Dr. Thompson Grayer for EP.  His sleep apnea was originally diagnosed at [facility] in 2014.  In 2018 a follow-up study confirmed severe obstructive sleep apnea as well as frequent central events.  Initially he was started on BiPAP therapy with subsequent transitioning to BiPAP auto ST.  When he reestablish care with me in 2022 after not having seen him since 2018, he had not used treatment in over 3 years.  At that time, I provided him with a new ResMed N30i mask as a sample remotely had used nasal pillow which had significant mask leak.  Presently, he  continues not to use CPAP and states mainly this was due to mask discomfort.  Presently he is symptomatic and experiencing nocturia 3-4 times per night.  He snores, wakes up gasping for breath, his sleep is nonrestorative, and he feels fatigued throughout the day.  I again had a very lengthy discussion with him regarding the severity of his sleep apnea with previously documented significant nocturnal hypoxemia to a nadir of 72%.  He is in permanent atrial fibrillation.  I discussed potential adverse cardiovascular consequences of untreated sleep apnea with reference to blood pressure, nocturnal arrhythmias, the risk for atrial fibrillation, as well as potential increased inflammation, insulin resistance, GERD, and potential for nocturnal hypoxemia contributing to ischemia.  He is not sleeping for adequate sleep duration giving himself only approximately 6 hours of sleep maximally.  I discussed optimal sleep duration at 7 and 9 hours.  We discussed the pathophysiology concerning central sleep apnea versus obstructive sleep apnea and the importance of maintaining euvolemia.  I am changing his prior parameters to a minimum EPAP of 8, a pressure support range of 5 to 20 cm of water with IPAP max of 25.  I discussed alternative treatment with Inspire and described the required surgical procedure with device implant of which he has no interest.  his blood pressure today is stable and on the low side on his regimen of bisoprolol 5 mg daily, Jardiance 10 mg, Entresto 24/26 mg twice daily, and spironolactone 25 mg daily.  He is anticoagulated on warfarin and is on rosuvastatin for hyperlipidemia.  I will see him in 3 months for reevaluation and hopefully he will have reinitiated therapy and be compliant.   Time spent: 40 minutes  Medication Adjustments/Labs and Tests Ordered: Current medicines are reviewed at length with the patient today.  Concerns regarding medicines are outlined above.  Medication changes, Labs and  Tests ordered today are listed in the Patient Instructions below. Patient Instructions  Medication Instructions:  Your physician recommends that you continue on your current medications as directed. Please refer to the Current Medication list given to you today.  *If you need a refill on your cardiac medications before your next appointment, please call your pharmacy*  Follow-Up: At [facility], you and your health needs are our priority.  As part of our continuing mission to provide you with exceptional heart care, we have created designated Provider Care Teams.  These Care Teams include your primary Cardiologist (physician) and Advanced Practice Providers (APPs -  Physician Assistants and Nurse Practitioners) who all work together to provide you with the care you need, when you need it.  We recommend signing up for the patient portal called "MyChart".  Sign up information is provided on this After Visit Summary.  MyChart is used to connect with patients for Virtual Visits (Telemedicine).  Patients are able to view lab/test results, encounter notes, upcoming appointments, etc.  Non-urgent messages can be sent to your provider as well.   To learn more about what you can do with MyChart, go to [email or web address].    Your next appointment:   3 month(s)  The format for your next appointment:   In Person  Provider:   Dr. [name] (sleep)  Important Information About Sugar         Signed, Shelva Majestic, MD  03/05/2022 2:03 PM    [facility] [place] [address] [place], [place], [place]  [address] Phone: [phone]

## 2022-02-25 NOTE — Progress Notes (Signed)
Remote ICD transmission.   

## 2022-03-01 ENCOUNTER — Ambulatory Visit (INDEPENDENT_AMBULATORY_CARE_PROVIDER_SITE_OTHER): Payer: BC Managed Care – PPO | Admitting: *Deleted

## 2022-03-01 DIAGNOSIS — I4891 Unspecified atrial fibrillation: Secondary | ICD-10-CM | POA: Diagnosis not present

## 2022-03-01 DIAGNOSIS — Z5181 Encounter for therapeutic drug level monitoring: Secondary | ICD-10-CM | POA: Diagnosis not present

## 2022-03-01 DIAGNOSIS — I480 Paroxysmal atrial fibrillation: Secondary | ICD-10-CM

## 2022-03-01 LAB — POCT INR: INR: 3 (ref 2.0–3.0)

## 2022-03-01 NOTE — Patient Instructions (Signed)
Continue warfarin 1/2 tablet daily except 1 tablet on Tuesdays, Thursdays and Saturdays.  Recheck in 7 weeks. Continue greens Pt has decided to stay on warfarin for now. 

## 2022-03-02 NOTE — Addendum Note (Signed)
Addended by: Douglass Rivers D on: 03/02/2022 04:53 PM   Modules accepted: Level of Service

## 2022-03-05 ENCOUNTER — Encounter: Payer: Self-pay | Admitting: Cardiovascular Disease

## 2022-03-10 DIAGNOSIS — H01001 Unspecified blepharitis right upper eyelid: Secondary | ICD-10-CM | POA: Diagnosis not present

## 2022-03-10 DIAGNOSIS — H01004 Unspecified blepharitis left upper eyelid: Secondary | ICD-10-CM | POA: Diagnosis not present

## 2022-03-10 DIAGNOSIS — H01002 Unspecified blepharitis right lower eyelid: Secondary | ICD-10-CM | POA: Diagnosis not present

## 2022-03-10 DIAGNOSIS — H01005 Unspecified blepharitis left lower eyelid: Secondary | ICD-10-CM | POA: Diagnosis not present

## 2022-03-21 ENCOUNTER — Ambulatory Visit (INDEPENDENT_AMBULATORY_CARE_PROVIDER_SITE_OTHER): Payer: BC Managed Care – PPO

## 2022-03-21 DIAGNOSIS — I5022 Chronic systolic (congestive) heart failure: Secondary | ICD-10-CM

## 2022-03-21 DIAGNOSIS — Z95810 Presence of automatic (implantable) cardiac defibrillator: Secondary | ICD-10-CM

## 2022-03-21 NOTE — Progress Notes (Signed)
EPIC Encounter for ICM Monitoring  Patient Name: [name] is a 71 y.o. male Date: 03/21/2022 Primary Care Physican: [place] Nation, MD Primary Cardiologist: Domenic Polite Electrophysiologist: Allred 01/13/2022 Weight: 220 lbs 03/21/2022 Weight:  218-219 lbs     Spoke with patient and heart failure questions reviewed.  Pt asymptomatic for fluid accumulation and feeling well.     Corvue Thoracic impedance suggesting normal fluid levels.     Prescribed:  Spironolactone 25 mg take 0.5 tablet (12.5 mg total) daily. Jardiance 10 mg take 1 tablet by mouth before breakfast   Recommendations:  No changes and encouraged to call if experiencing any fluid symptoms.   Follow-up plan: ICM clinic phone appointment on 04/25/2022.   91 day device clinic remote transmission 04/04/2022.       EP/Cardiology Office visits:  06/24/2022 with Dr [name].   Copy of ICM check sent to [name].  3 month ICM trend: 03/21/2022.    12-14 Month ICM trend:     Rosalene Billings, RN 03/21/2022 5:02 PM

## 2022-04-07 ENCOUNTER — Other Ambulatory Visit: Payer: Self-pay | Admitting: Cardiology

## 2022-04-11 ENCOUNTER — Encounter: Payer: Self-pay | Admitting: *Deleted

## 2022-04-11 NOTE — Progress Notes (Signed)
Fax notification received from [facility] that entresto 24/26 mg BID approved from 04/08/2022 through 04/09/2023.

## 2022-04-14 DIAGNOSIS — H01004 Unspecified blepharitis left upper eyelid: Secondary | ICD-10-CM | POA: Diagnosis not present

## 2022-04-14 DIAGNOSIS — H01002 Unspecified blepharitis right lower eyelid: Secondary | ICD-10-CM | POA: Diagnosis not present

## 2022-04-14 DIAGNOSIS — H01005 Unspecified blepharitis left lower eyelid: Secondary | ICD-10-CM | POA: Diagnosis not present

## 2022-04-14 DIAGNOSIS — H01001 Unspecified blepharitis right upper eyelid: Secondary | ICD-10-CM | POA: Diagnosis not present

## 2022-04-19 ENCOUNTER — Ambulatory Visit (INDEPENDENT_AMBULATORY_CARE_PROVIDER_SITE_OTHER): Payer: BC Managed Care – PPO | Admitting: *Deleted

## 2022-04-19 DIAGNOSIS — Z5181 Encounter for therapeutic drug level monitoring: Secondary | ICD-10-CM | POA: Diagnosis not present

## 2022-04-19 DIAGNOSIS — I480 Paroxysmal atrial fibrillation: Secondary | ICD-10-CM

## 2022-04-19 DIAGNOSIS — I4891 Unspecified atrial fibrillation: Secondary | ICD-10-CM

## 2022-04-19 LAB — POCT INR: INR: 2.4 (ref 2.0–3.0)

## 2022-04-19 NOTE — Patient Instructions (Signed)
Continue warfarin 1/2 tablet daily except 1 tablet on Tuesdays, Thursdays and Saturdays.  Recheck in 7 weeks. Continue greens Pt has decided to stay on warfarin for now. 

## 2022-04-25 ENCOUNTER — Ambulatory Visit (INDEPENDENT_AMBULATORY_CARE_PROVIDER_SITE_OTHER): Payer: BC Managed Care – PPO

## 2022-04-25 DIAGNOSIS — I5022 Chronic systolic (congestive) heart failure: Secondary | ICD-10-CM

## 2022-04-25 DIAGNOSIS — Z95810 Presence of automatic (implantable) cardiac defibrillator: Secondary | ICD-10-CM | POA: Diagnosis not present

## 2022-04-26 NOTE — Progress Notes (Signed)
EPIC Encounter for ICM Monitoring  Patient Name: [name] is a 71 y.o. male Date: 04/26/2022 Primary Care Physican: [place] Nation, MD Primary Cardiologist: Domenic Polite Electrophysiologist: Allred 01/13/2022 Weight: 220 lbs 03/21/2022 Weight:  218-219 lbs 04/26/2022 Weight: 220 lbs     Spoke with patient and heart failure questions reviewed.  Pt asymptomatic for fluid accumulation and feeling well.     Corvue Thoracic impedance suggesting normal fluid levels.     Prescribed:  Spironolactone 25 mg take 0.5 tablet (12.5 mg total) daily. Jardiance 10 mg take 1 tablet by mouth before breakfast   Recommendations:  No changes and encouraged to call if experiencing any fluid symptoms.   Follow-up plan: ICM clinic phone appointment on 05/30/2022.   91 day device clinic remote transmission 08/18/2022.       EP/Cardiology Office visits:  06/24/2022 with Dr [name].  06/28/2022 with Dr Domenic Polite.   Copy of ICM check sent to [name].  3 month ICM trend: 04/25/2022.    12-14 Month ICM trend:     Rosalene Billings, RN 04/26/2022 4:10 PM

## 2022-05-16 NOTE — Progress Notes (Signed)
History of Present Illness:   8.6.2019: IPSS 22 QOL score 4  Symptoms have worsened over the past year. He is still on finasteride.  He does not have a nickel allergy.  He would like to consider a Urolift procedure.  Pt had prostate U/S 9.11.2019--volume 32 ml    11.17.2020: Here today for follow-up. He reports stable sx's since last visit but he does note that he has been off finasteride for the last 4 weeks -- there were errors with refilling his rx. He also has had two scheduled urolifts that were cancelled -- the first from a hernia operation and the second due to pneumonia (possible undiagnosed covid?). He has been tolerating his medication well but he does note he has been having increased issues with impotence.   11.16.2021: Pt continues on finasteride but reports signficant LUTS. He was previously scheduled for a Urolift procedure but was unable to attend the procedure due to a concurrent hernia repair. He indicates nocturia is particularly concerning.  8.29.2023: Here for routine check.  BPH follow-up.  On finasteride with significant LUTS despite that.  IPSS 23, quality-of-life score 3.  He does have sleep apnea, is not able to use CPAP.  Is going to discuss implant management soon.  Biggest issue is nocturia.     Past Medical History:  Diagnosis Date   AICD (automatic cardioverter/defibrillator) present 2006   ICD   Anterior myocardial infarction ([place])    BCC (basal cell carcinoma) superficial 11/22/2016   Left back sholder   Chronic systolic heart failure ([place])    Coronary atherosclerosis of native coronary artery    a. s/p BMS to LAD and OM1 b. 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[facility])    SCC (squamous  cell carcinoma) 08/04/2006   right shoulder (CX35FU)   SCC (squamous cell carcinoma) well differentiated 12/13/2011   right temple   Squamous cell carcinoma in situ (SCCIS) 08/04/2006   right sholder   Squamous cell carcinoma of skin 11/09/2020   in situ- left upper back   Squamous cell carcinoma of skin 11/09/2020   KA-right dorsal hand   Stroke [facility])     Past Surgical History:  Procedure Laterality Date   AICD implantation  2011   St Jude ICD implanted for primary prevention of sudden death   BIOPSY  05/05/20   Procedure: BIOPSY;  Surgeon: [name], MD;  [facility]: [facility];  Service: Endoscopy;;   CATARACT EXTRACTION W/PHACO Left 03/27/2017   Procedure: CATARACT EXTRACTION PHACO AND INTRAOCULAR LENS PLACEMENT ([place]);  Surgeon: [name], MD;  Location: [facility];  Service: Ophthalmology;  Laterality: Left;  CDE: 8.48   CATARACT EXTRACTION W/PHACO Right 05/29/2017   Procedure: CATARACT EXTRACTION PHACO AND INTRAOCULAR LENS PLACEMENT ([facility]);  Surgeon: [name], MD;  Location: [facility];  Service: Ophthalmology;  Laterality: Right;  CDE: 7.48   COLONOSCOPY N/A 05/05/20   Scattered medium-mouthed diverticula in sigmoid, three sessile polyps in hepatic flexure and cecum. 5-7 mm in size. Tubular adenomas. Benign colonic mucosa. 3 year surveillance.    EYE SURGERY     ICD GENERATOR CHANGEOUT N/A 01/19/2017   [facility] VR ICD generator change  by Dr [name]   LAPAROSCOPY N/A 10/23/2017   Procedure: LAPAROSCOPY DIAGNOSTIC;  Surgeon: [name], MD;  Location: [facility];  Service: General;  Laterality: N/A;   LEFT HEART CATH AND CORONARY ANGIOGRAPHY N/A 06/21/2018   Procedure: LEFT HEART CATH AND CORONARY ANGIOGRAPHY;  Surgeon: [name], MD;  Location: [place] [facility];  Service: Cardiovascular;  Laterality: N/A;   POLYPECTOMY  04/21/2020   Procedure: POLYPECTOMY;  Surgeon: [name], MD;  [facility];  Service: Endoscopy;;   VENTRAL HERNIA REPAIR N/A 10/23/2017    Procedure: VENTRAL HERNIA REPAIR;  Surgeon: [name], MD;  Location: [place];  Service: General;  Laterality: N/A;    Home Medications:  Allergies as of 05/17/2022   No Known Allergies      Medication List        Accurate as of May 16, 2022  7:35 PM. If you have any questions, ask your nurse or doctor.          aspirin 81 MG tablet Take 81 mg by mouth daily.   BENEFIBER ON THE GO PO Take by mouth daily.   bisoprolol 5 MG tablet Commonly known as: ZEBETA TAKE 1 TABLET (5 MG TOTAL) BY MOUTH DAILY.   dicyclomine 10 MG capsule Commonly known as: BENTYL Take 1 capsule (10 mg total) by mouth 4 (four) times daily -  before meals and at bedtime. For frequent stools. Start off with just twice a day.   empagliflozin 10 MG Tabs tablet Commonly known as: Jardiance Take 1 tablet (10 mg total) by mouth daily before breakfast.   Entresto 24-26 MG Generic drug: sacubitril-valsartan TAKE 1 TABLET BY MOUTH TWICE A DAY   finasteride 5 MG tablet Commonly known as: PROSCAR TAKE 1 TABLET BY MOUTH EVERY DAY   lipase/protease/amylase 36000 UNITS Cpep capsule Commonly known as: Creon Take 2 capsules (72,000 Units total) by mouth 3 (three) times daily with meals. May also take 1 capsule (36,000 Units total) as needed (with snacks).   nitroGLYCERIN 0.4 MG SL tablet Commonly known as: NITROSTAT Place 1 tablet (0.4 mg total) under the tongue every 5 (five) minutes as needed for chest pain.   rosuvastatin 20 MG tablet Commonly known as: CRESTOR TAKE 1 TABLET (20 MG TOTAL) BY MOUTH DAILY.   spironolactone 25 MG tablet Commonly known as: ALDACTONE TAKE 1 TABLET BY MOUTH EVERY DAY   warfarin 5 MG tablet Commonly known as: COUMADIN Take as directed by the anticoagulation clinic. If you are unsure how to take this medication, talk to your nurse or doctor. Original instructions: TAKE 1/2 TABLET DAILY EXCEPT 1 TABLET ON SUNDAYS, TUESDAYS AND THURSDAYS OR AS DIRECTED         Allergies: No Known Allergies  Family History  Problem Relation Age of Onset   Cancer Mother        lymphoma    Heart attack Father    Heart disease Brother    Colon cancer Neg Hx    Colon polyps Neg Hx     Social History:  reports that he has never smoked. He has never used smokeless tobacco. He reports current alcohol use. He reports that he does not use drugs.  ROS: A complete review of systems was performed.  All systems are negative except for pertinent findings as noted.  Physical Exam:  Vital signs in last 24 hours: There were no vitals taken for this visit. Constitutional:  Alert and oriented, No acute distress Cardiovascular: Regular rate  Respiratory: Normal respiratory effort GI: Hernias Genitourinary: Normal male phallus, testes are descended bilaterally and non-tender and without masses, scrotum is normal in appearance without lesions or masses, perineum is normal on inspection.  Normal anal sphincter tone, prostate 30 g, symmetric, nonnodular, nontender. Lymphatic: No lymphadenopathy Neurologic: Grossly intact, no focal deficits Psychiatric: Normal mood and affect  I have reviewed prior pt notes  I have reviewed notes from referring/previous physicians  I have reviewed urinalysis results  I have independently reviewed prior imaging-prostate ultrasound for volume in 2019 was 32 mL  I have reviewed prior PSA results last PSA 0.6 in late 2021, correction for finasteride 0.2    Impression/Assessment:  BPH with significant symptomatology, not doing so well on finasteride.  His prostate is not significantly large.  Stride is helping him.  Nocturia, significant  Plan:  1.  I think at this point is worth having him come off of finasteride.  2.  We will check his PSA today  3.  We did talk about procedure for his prostate-TURP versus UroLift.  I think he is a fine candidate for either 1.  I would like him to get his nocturia taken care of first, perhaps  this will improve significantly with having his sleep apnea treated  4.  I will have him come back in about 3 months to recheck with him off of finasteride and hopefully have a more direction for his sleep apnea

## 2022-05-17 ENCOUNTER — Ambulatory Visit: Payer: BC Managed Care – PPO | Admitting: Urology

## 2022-05-17 VITALS — BP 100/70 | HR 67

## 2022-05-17 DIAGNOSIS — N401 Enlarged prostate with lower urinary tract symptoms: Secondary | ICD-10-CM

## 2022-05-17 DIAGNOSIS — R351 Nocturia: Secondary | ICD-10-CM | POA: Diagnosis not present

## 2022-05-17 LAB — URINALYSIS, ROUTINE W REFLEX MICROSCOPIC
Bilirubin, UA: NEGATIVE
Ketones, UA: NEGATIVE
Leukocytes,UA: NEGATIVE
Nitrite, UA: NEGATIVE
Protein,UA: NEGATIVE
Specific Gravity, UA: 1.02 (ref 1.005–1.030)
Urobilinogen, Ur: 0.2 mg/dL (ref 0.2–1.0)
pH, UA: 5 (ref 5.0–7.5)

## 2022-05-17 LAB — MICROSCOPIC EXAMINATION
Renal Epithel, UA: NONE SEEN /hpf
WBC, UA: NONE SEEN /hpf (ref 0–5)

## 2022-05-18 ENCOUNTER — Encounter: Payer: Self-pay | Admitting: Cardiovascular Disease

## 2022-05-18 ENCOUNTER — Ambulatory Visit: Payer: BC Managed Care – PPO | Attending: Cardiovascular Disease | Admitting: Cardiovascular Disease

## 2022-05-18 DIAGNOSIS — E782 Mixed hyperlipidemia: Secondary | ICD-10-CM

## 2022-05-18 DIAGNOSIS — G4733 Obstructive sleep apnea (adult) (pediatric): Secondary | ICD-10-CM

## 2022-05-18 DIAGNOSIS — Z95810 Presence of automatic (implantable) cardiac defibrillator: Secondary | ICD-10-CM | POA: Diagnosis not present

## 2022-05-18 DIAGNOSIS — I255 Ischemic cardiomyopathy: Secondary | ICD-10-CM | POA: Diagnosis not present

## 2022-05-18 DIAGNOSIS — Z7901 Long term (current) use of anticoagulants: Secondary | ICD-10-CM

## 2022-05-18 DIAGNOSIS — G4731 Primary central sleep apnea: Secondary | ICD-10-CM

## 2022-05-18 DIAGNOSIS — I4819 Other persistent atrial fibrillation: Secondary | ICD-10-CM | POA: Diagnosis not present

## 2022-05-18 LAB — PSA: Prostate Specific Ag, Serum: 0.6 ng/mL (ref 0.0–4.0)

## 2022-05-18 NOTE — Progress Notes (Unsigned)
Cardiology Office Note    Date:  05/19/2022   ID:  [name], DOB 04-17-1951, MRN [id number]  PCP:  [place], MD  Cardiologist:  Shelva Majestic, MD (sleep); [name]  48-monthfollow-up sleep evaluation   History of Present Illness:  Marcus MASCIOis a 71y.o. male who presents to the office for sleep evaluation.  I  saw him in October 2018 and had not seen him in follow-up until July 2022 when he reestablish care.  I last saw him on February 21, 2022.   [name] a history of an ischemic cardiomyopathy, known CAD, is status post ICD therapy for reduced LV function.  An echo Doppler study in April 2018 showed an EF of 25 to 30%.  [facility].  In 2018 he was found to have severe obstructive sleep apnea on reevaluation with an AHI of 68.7/h.  On his titration study he was transitioned to BiPAP therapy and was only titrated to a pressure of 12/6.  His AHI remains significantly elevated at 31.7/h and he also had moderate central apnea index of 8.5.  On the diagnostic portion of his study had significant oxygen desaturation to nadir 72% and there was evidence for loud snoring.  His ECG showed PVCs.  BiPAP auto therapy was recommended with a minimum EPAP pressure of 9 with pressure support of 5.  I saw him for initial sleep evaluation in August 2018.  At that time he had felt significantly improved since initiating BiPAP therapy but a download continue to show moderate events with an AHI of 19.8 and a central apnea index of 13.2.  He subsequently was transitioned to a ResMed air curve 10 ST auto unit and I saw him in follow-up on July 17, 2017.  At the time he was using nasal pillow mask.  He was having significant mask leak.  When I saw him in July 2022 I had not seen him since 2018.  He stopped using therapy approximately 3 years ago.  He is followed by Dr. MDomenic Politefor his primary cardiology care  and Dr. ARayann Hemanfor his EP.  When he was most recently seen by Dr. MDomenic Politeit was recommended that he see me back for reassessment of his complex sleep apnea and reinitiation of treatment. At my July 2022 evaluation he was sleeping poorly and admitted to snoring, nocturia, and frequent awakenings.  Epworth Sleepiness Scale score was calculated in the office  and this endorsed at 7 arguing against significant excessive daytime sleepiness.  During that evaluation I had a very lengthy discussion with him concerning his untreated sleep apnea effects on blood pressure, potential nocturnal hypoxemia contributory arrhythmia and undoubtedly being a contributor to his permanent atrial fibrillation.  I also discussed potential for nocturnal ischemia particularly in the setting of nocturnal hypoxemia and discussed the importance of using treatment ideally for 7 to 8 hours.  I last saw him on February 21, 2022.  He had recently been evaluated by Dr. MDomenic Politeand he relayed to Dr. MDomenic Politethat he was having significant difficulty tolerating the mask and as result has not been using therapy.  Presently, he admits to nocturia at least 3-4 times per night.  He typically goes to bed between 11:30 and midnight and wakes up at 6 AM.  He does not sleep well.  He admits to snoring, he has been told that he  stops breathing during sleep.  He has awakened gasping for breath during the night.  His sleep is nonrestorative.  He feels tired during the day however his Epworth Sleepiness Scale score is calculated in the office today which endorsed at 5.  He continues to be on bisoprolol 5 mg daily, Jardiance 10 mg daily, Entresto 24/26 mg twice a day, spironolactone 25 mg in addition to warfarin for anticoagulation and rosuvastatin for hyperlipidemia.  During that evaluation, I had an extensive discussion with him regarding sleep apnea and potential adverse cardiovascular consequences.  He was having both obstructive and central events and I  adjusted his parameters to a minimum EPAP pressure of 8, pressure support range of 5-20, and IPAP max of 25.  Also discussed alternative treatment with inspire.  Over the last several months, [name] continues to have difficulty with sleep.  I obtained a download from July 1 through April 19, 2022.  Usage days was only 63% with usage greater than 4 hours at 53%.  Average use was 5 hours and 34 minutes.  It appears that his ResMed air curve 10 ST - A is set at a EPAP pressure of 8 (rather than a range), with minimum pressure support 5-20 and target patient rate of 12 breaths/min.  His 95th pressure has shown an EPAP of 7.9 with IPAP of 25.7.  He has daily mask leak.  An Epworth Sleepiness Scale was calculated in the office today and this endorsed that 6 arguing against significant residual daytime sleepiness.  He continues to experience nocturia and had seen Dr. Diona Fanti for urologic evaluation.  He presents for evaluation.  Past Medical History:  Diagnosis Date   AICD (automatic cardioverter/defibrillator) present 2006   ICD   Anterior myocardial infarction ([place])    BCC (basal cell carcinoma) superficial 11/22/2016   Left back sholder   Chronic systolic heart failure ([place])    Coronary atherosclerosis of native coronary artery    a. s/p BMS to LAD and OM1 b. 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[place])    Ischemic cardiomyopathy    LVEF 30%, status post ICD   OSA (obstructive sleep apnea)    unable to tolerate CPAP   Paroxysmal atrial fibrillation ([facility])    On coumadin   Pseudoaneurysm ([facility])    SCC (squamous cell carcinoma) 08/04/2006   right shoulder ([id number])   SCC (squamous cell carcinoma) well differentiated 12/13/2011   right temple   Squamous cell carcinoma in situ (SCCIS) 08/04/2006   right sholder   Squamous cell carcinoma of skin 11/09/2020   in situ- left upper back   Squamous cell carcinoma of skin  11/09/2020   KA-right dorsal hand   Stroke [facility])     Past Surgical History:  Procedure Laterality Date   AICD implantation  2011   St Jude ICD implanted for primary prevention of sudden death   BIOPSY  2020/05/04   Procedure: BIOPSY;  Surgeon: [name], MD;  Location[facility];  Service: Endoscopy;;   CATARACT EXTRACTION W/PHACO Left 03/27/2017   Procedure: CATARACT EXTRACTION PHACO AND INTRAOCULAR LENS PLACEMENT ([place]);  Surgeon: [name], MD;  Location: [facility] ORS;  Service: Ophthalmology;  Laterality: Left;  CDE: 8.48   CATARACT EXTRACTION W/PHACO Right 05/29/2017   Procedure: CATARACT EXTRACTION PHACO AND INTRAOCULAR LENS PLACEMENT (IOC);  Surgeon: [name], MD;  Location: [facility] ORS;  Service: Ophthalmology;  Laterality: Right;  CDE: 7.48   COLONOSCOPY N/A 04/21/2020   Scattered medium-mouthed diverticula in sigmoid, three sessile polyps in hepatic flexure and cecum. 5-7 mm in size. Tubular adenomas. Benign colonic mucosa. 3 year surveillance.    EYE SURGERY     ICD GENERATOR CHANGEOUT N/A 01/19/2017   SJM Ellipse VR ICD generator change by Dr [name]   LAPAROSCOPY N/A 10/23/2017   Procedure: LAPAROSCOPY DIAGNOSTIC;  Surgeon: [name], MD;  Location: [place];  Service: General;  Laterality: N/A;   LEFT HEART CATH AND CORONARY ANGIOGRAPHY N/A 06/21/2018   Procedure: LEFT HEART CATH AND CORONARY ANGIOGRAPHY;  Surgeon: [name], MD;  Location: [place] CV LAB;  Service: Cardiovascular;  Laterality: N/A;   POLYPECTOMY  04/21/2020   Procedure: POLYPECTOMY;  Surgeon: [name], MD;  [facility];  Service: Endoscopy;;   VENTRAL HERNIA REPAIR N/A 10/23/2017   Procedure: VENTRAL HERNIA REPAIR;  Surgeon: [name], MD;  Location: [place];  Service: General;  Laterality: N/A;    Current Medications: Outpatient Medications Prior to Visit  Medication Sig Dispense Refill   aspirin 81 MG tablet Take 81 mg by mouth daily.     bisoprolol (ZEBETA) 5 MG tablet  TAKE 1 TABLET (5 MG TOTAL) BY MOUTH DAILY. 90 tablet 2   dicyclomine (BENTYL) 10 MG capsule Take 1 capsule (10 mg total) by mouth 4 (four) times daily -  before meals and at bedtime. For frequent stools. Start off with just twice a day. 90 capsule 0   empagliflozin (JARDIANCE) 10 MG TABS tablet Take 1 tablet (10 mg total) by mouth daily before breakfast. 30 tablet 11   ENTRESTO 24-26 MG TAKE 1 TABLET BY MOUTH TWICE A DAY 180 tablet 3   finasteride (PROSCAR) 5 MG tablet TAKE 1 TABLET BY MOUTH EVERY DAY 90 tablet 3   lipase/protease/amylase (CREON) 36000 UNITS CPEP capsule Take 2 capsules (72,000 Units total) by mouth 3 (three) times daily with meals. May also take 1 capsule (36,000 Units total) as needed (with snacks). 240 capsule 11   nitroGLYCERIN (NITROSTAT) 0.4 MG SL tablet Place 1 tablet (0.4 mg total) under the tongue every 5 (five) minutes as needed for chest pain. 25 tablet 3   rosuvastatin (CRESTOR) 20 MG tablet TAKE 1 TABLET (20 MG TOTAL) BY MOUTH DAILY. 90 tablet 3   spironolactone (ALDACTONE) 25 MG tablet TAKE 1 TABLET BY MOUTH EVERY DAY (Patient taking differently: Take 12.5 mg by mouth daily.) 90 tablet 1   warfarin (COUMADIN) 5 MG tablet TAKE 1/2 TABLET DAILY EXCEPT 1 TABLET ON SUNDAYS, TUESDAYS AND THURSDAYS OR AS DIRECTED 90 tablet 3   Wheat Dextrin (BENEFIBER ON THE GO PO) Take by mouth daily. (Patient not taking: Reported on 02/21/2022)     No facility-administered medications prior to visit.     Allergies:   Patient has no known allergies.   Social History   Socioeconomic History   Marital status: Married    Spouse name: Not on file   Number of children: Not on file   Years of education: Not on file   Highest education level: Not on file  Occupational History   Occupation: retired    Comment: Banking  Tobacco Use   Smoking status: Never   Smokeless tobacco: Never  Vaping Use   Vaping Use: Never used  Substance and Sexual Activity   Alcohol use: Yes    Comment:  occasional "moderate"   Drug use: No   Sexual activity: Yes  Other  Topics Concern   Not on file  Social History Narrative   Not on file   Social Determinants of Health   Financial Resource Strain: Not on file  Food Insecurity: Not on file  Transportation Needs: Not on file  Physical Activity: Not on file  Stress: Not on file  Social Connections: Not on file    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  He had worked at [employer] which was subsequently bought by [employer].  He has 6 grandchildren.  Family History:  The patient's family history includes Cancer in his mother; Heart attack in his father; Heart disease in his brother.   ROS General: Negative; No fevers, chills, or night sweats;  HEENT: Negative; No changes in vision or hearing, sinus congestion, difficulty swallowing Pulmonary: Negative; No cough, wheezing, shortness of breath, hemoptysis Cardiovascular: See HPI GI: Negative; No nausea, vomiting, diarrhea, or abdominal pain GU: Negative; No dysuria, hematuria, or difficulty voiding Musculoskeletal: Negative; no myalgias, joint pain, or weakness Hematologic/Oncology: Negative; no easy bruising, bleeding Endocrine: Negative; no heat/cold intolerance; no diabetes Neuro: Negative; no changes in balance, headaches Skin: Negative; No rashes or skin lesions Psychiatric: Negative; No behavioral problems, depression Sleep: Complex severe sleep apnea sleep apnea with  snoring and previous documented significant nocturnal hypoxemia and nocturia; no bruxism, restless legs, hypnogognic hallucinations, no cataplexy Other comprehensive 14 point system review is negative.   PHYSICAL EXAM:   VS:  BP 108/60   Pulse 76   Ht 6' (1.829 m)   Wt 226 lb 12.8 oz (102.9 kg)   SpO2 98%   BMI 30.76 kg/m     Repeat blood pressure by me was 106/60  Wt Readings from Last 3 Encounters:  05/18/22 226 lb 12.8 oz (102.9 kg)  02/21/22 227 lb 9.6 oz (103.2 kg)  12/23/21 226 lb  (102.5 kg)     General: Alert, oriented, no distress.  Skin: normal turgor, no rashes, warm and dry HEENT: Normocephalic, atraumatic. Pupils equal round and reactive to light; sclera anicteric; extraocular muscles intact;  Nose without nasal septal hypertrophy Mouth/Parynx benign; Mallinpatti scale 3/4 Neck: No JVD, no carotid bruits; normal carotid upstroke Lungs: clear to ausculatation and percussion; no wheezing or rales Chest wall: without tenderness to palpitation Heart: PMI not displaced, irregular irregular rhythm consistent with atrial fibrillation, s1 s2 normal, 1/6 systolic murmur, no diastolic murmur, no rubs, gallops, thrills, or heaves Abdomen: soft, nontender; no hepatosplenomehaly, BS+; abdominal aorta nontender and not dilated by palpation. Back: no CVA tenderness Pulses 2+ Musculoskeletal: full range of motion, normal strength, no joint deformities Extremities: no clubbing cyanosis or edema, Homan's sign negative  Neurologic: grossly nonfocal; Cranial nerves grossly wnl Psychologic: Normal mood and affect    Studies/Labs Reviewed:   May 18, 2022 ECG (independently read by me): Atrial fibrillation at 76, inferior Q waves PRWP  February 21, 2022 ECG (independently read by me): Atrial fibrillation at 69, LAD, Inferior Q waves, PRWP anteriorly  I personally reviewed his last ECG from December 09, 2020 which demonstrates atrial fibrillation at 71 bpm, QS complex V1 through V4 and Q waves in lead III consistent with prior anterior wall myocardial infarction.  Recent Labs:    Latest Ref Rng & Units 06/17/2021    1:57 PM 12/02/2019   10:22 AM 11/28/2018   12:15 PM  BMP  Glucose 70 - 99 mg/dL 93  103  99   BUN 8 - 23 mg/dL _0 Creatinine 0.61 -  1.24 mg/dL 1.17  1.26  1.21   BUN/Creat Ratio 10 - 24  16    Sodium 135 - 145 mmol/L 138  139  134   Potassium 3.5 - 5.1 mmol/L 4.4  4.7  4.4   Chloride 98 - 111 mmol/L 104  102  100   CO2 22 - 32 mmol/L _0 Calcium 8.9 - 10.3 mg/dL 9.6  9.5  9.3         Latest Ref Rng & Units 11/16/2018    1:44 PM 06/19/2018    3:25 AM  Hepatic Function  Total Protein 6.5 - 8.1 g/dL 7.2  7.1   Albumin 3.5 - 5.0 g/dL 3.0  3.8   AST 15 - 41 U/L 33  19   ALT 0 - 44 U/L 48  18   Alk Phosphatase 38 - 126 U/L 98  47   Total Bilirubin 0.3 - 1.2 mg/dL 1.0  1.4        Latest Ref Rng & Units 11/28/2018   12:15 PM 11/16/2018    1:44 PM 06/20/2018    2:36 PM  CBC  WBC 4.0 - 10.5 K/uL 7.1  9.0  11.0   Hemoglobin 13.0 - 17.0 g/dL 15.4  14.6  15.0   Hematocrit 39.0 - 52.0 % 46.0  45.4  46.2   Platelets 150.0 - 400.0 K/uL 304.0  300  131    Lab Results  Component Value Date   MCV 93.9 11/28/2018   MCV 94.8 11/16/2018   MCV 96.5 06/20/2018   Lab Results  Component Value Date   TSH 0.564 06/19/2018   No results found for: "HGBA1C"   BNP    Component Value Date/Time   BNP 362.7 (H) 06/19/2018 0325    ProBNP    Component Value Date/Time   PROBNP 817 (H) 12/02/2019 1022   PROBNP 209.0 (H) 11/28/2018 1215     Lipid Panel  No results found for: "CHOL", "TRIG", "HDL", "CHOLHDL", "VLDL", "LDLCALC", "LDLDIRECT", "LABVLDL"   RADIOLOGY: No results found.   Additional studies/ records that were reviewed today include:    01/20/2017 CLINICAL INFORMATION The patient is referred for a split night study with BPAP.   MEDICATIONS aspirin 81 MG tablet bisoprolol (ZEBETA) 5 MG tablet calcium carbonate (TUMS - DOSED IN MG ELEMENTAL CALCIUM) 500 MG chewable tablet finasteride (PROSCAR) 5 MG tablet losartan (COZAAR) 50 MG tablet nitroGLYCERIN (NITROSTAT) 0.4 MG SL tablet omega-3 acid ethyl esters (LOVAZA) 1 G capsule rosuvastatin (CRESTOR) 20 MG tablet spironolactone (ALDACTONE) 25 MG tablet warfarin (COUMADIN) 5 MG tablet   Medications self-administered by patient taken the night of the study : N/A   SLEEP STUDY TECHNIQUE As per the AASM Manual for the Scoring of Sleep and Associated Events  v2.3 (April 2016) with a hypopnea requiring 4% desaturations.   The channels recorded and monitored were frontal, central and occipital EEG, electrooculogram (EOG), submentalis EMG (chin), nasal and oral airflow, thoracic and abdominal wall motion, anterior tibialis EMG, snore microphone, electrocardiogram, and pulse oximetry. Bi-level positive airway pressure (BiPAP) was initiated when the patient met split night criteria and was titrated according to treat sleep-disordered breathing.   RESPIRATORY PARAMETERS   Diagnostic Total AHI (/hr):            68.7     RDI (/hr):         71.2     OA Index (/hr):  24.9     CA Index (/hr):      8.5 REM AHI (/hr):            53.3     NREM AHI (/hr):          69.8     Supine AHI (/hr):         69.2     Non-supine AHI (/hr):        66.67 Min O2 Sat (%):          72.00   Mean O2 (%):  92.21   Time below 88% (min):           14.0                    Titration Optimal IPAP Pressure (cm):             Optimal EPAP Pressure (cm):                        AHI at Optimal Pressure (/hr):          N/A      Min O2 at Optimal Pressure (%):       84.00 Sleep % at Optimal (%):         N/A      Supine % at Optimal (%):       N/A                                            SLEEP ARCHITECTURE The study was initiated at 9:49:40 PM and terminated at 5:03:38 AM. The total recorded time was 434.0 minutes. EEG confirmed total sleep time was 329.0 minutes yielding a sleep efficiency of 75.8%. Sleep onset after lights out was 33.3 minutes with a REM latency of 150.0 minutes. The patient spent 10.03% of the night in stage N1 sleep, 74.47% in stage N2 sleep, 0.00% in stage N3 and 15.50% in REM. Wake after sleep onset (WASO) was 71.7 minutes. The Arousal Index was 39.9/hour.   LEG MOVEMENT DATA The total Periodic Limb Movements of Sleep (PLMS) were 0. The PLMS index was 0.00 .   CARDIAC DATA The 2 lead EKG demonstrated sinus rhythm. The mean heart rate was 54.91 beats per minute.  Other EKG findings include: PVCs.   IMPRESSIONS - Severe obstructive sleep apnea occurred during the diagnostic portion of the study (AHI = 68.7 /hour). An optimal BPAP pressure could not be selected for this patient based on the available study data. The patient was only titrated up to a BiPAP pressure of 12/6 and AHI remained elevated at 31.7/h with an oxygen nadir at 89%. - Moderate central sleep apnea occurred during the diagnostic portion of the study (CAI = 8.5/hour). - Moderate oxygen desaturation was noted during the diagnostic portion of the study to a nadir of 83% during NREM and 72% during REM sleep. - The patient snored with Loud snoring volume during the diagnostic portion of the study. - EKG findings include PVCs. - Clinically significant periodic limb movements of sleep did not occur during the study.   DIAGNOSIS - Obstructive Sleep Apnea (327.23 [G47.33 ICD-10])   RECOMMENDATIONS - Recommend an initial trial of BiPAP Auto with and EPAP min of 7, PS of 5 and IPAP max of 25 with heated humidification. - Effort should be made to optimize  nasal and oral pharyngeal patency. - Effort should be made to optimize the patient's volume status in this patient with a significant cardiomyopathy and central apneic events. - Avoid alcohol, sedatives and other CNS depressants that may worsen sleep apnea and disrupt normal sleep architecture. - Sleep hygiene should be reviewed to assess factors that may improve sleep quality. - Weight management and regular exercise should be initiated or continued. - Recommend a download be obtained in 2-4 weeks to assess initial and subsequent efficacy and plan for sleep clinic evaluation after 4 weeks of therapy.   ASSESSMENT:    1. Complex sleep apnea syndrome   2. Persistent atrial fibrillation ([place])   3. Ischemic cardiomyopathy   4. Automatic implantable cardioverter-defibrillator in situ   5. Anticoagulated on warfarin   6. Mixed hyperlipidemia       PLAN:  Mr. Kia Stavros is a 71 year-old gentleman who is retired from the [employer].  He has a history of an ischemic cardiomyopathy, CAD, and underwent ICD implantation for markedly reduced LV function.  An echo Doppler study in April 2018 showed an EF of 25 to 30%.  He has a history of permanent atrial fibrillation and is followed by [name] for primary cardiology care and previously saw Dr. Thompson Grayer for EP.  His sleep apnea was originally diagnosed at [facility] in 2014.  In 2018 a follow-up study confirmed severe obstructive sleep apnea as well as frequent central events.  Initially he was started on BiPAP therapy with subsequent transitioning to BiPAP auto ST.  When he reestablish care with me in 2022 after not having seen him since 2018, he had not used treatment in over 3 years.  At that time, I provided him with a new ResMed N30i mask as a sample remotely had used nasal pillow which had significant mask leak.  Presently, he continues not to use CPAP and states mainly this was due to mask discomfort.  When I last saw him, he was experiencing nocturia 3-4 times per night, and admitted to snoring, waking up gasping for breath, his sleep is nonrestorative, and he feels fatigued throughout the day.  I had a very lengthy discussion with him regarding the severity of his sleep apnea with previously documented significant nocturnal hypoxemia to a nadir of 72%.  He is in permanent atrial fibrillation.  I discussed potential adverse cardiovascular consequences of untreated sleep apnea with reference to blood pressure, nocturnal arrhythmias, the risk for atrial fibrillation, as well as potential increased inflammation, insulin resistance, GERD, and potential for nocturnal hypoxemia contributing to ischemia.  He is not sleeping for adequate sleep duration giving himself only approximately 6 hours of sleep maximally.  I discussed optimal sleep duration at 7 and 9 hours.  We  discussed the pathophysiology concerning central sleep apnea versus obstructive sleep apnea and the importance of maintaining euvolemia.  During that evaluation I also discussed potential alternative treatment including Inspire.  Presently, usage continues to be suboptimal.  Average use is only 5 hours and 34 minutes.  He has been using a ResMed N30i and does not believe he sleeps with mouth open.  However with his high pressure requirement he may benefit potentially from transitioning to an F30i mask.  I have recommended that we change his EPAP pressure from a fixed pressure at 8 and potentially allow this to have an EPAP max pressure of 13.  This was unable to be done in the office today.  Apparently we do not have  any data since April 19, 2022 even though he admits that he continues to still use treatment.  This may be a ResMed issue.  I have suggested he contact [facility] and [facility]Med since we cannot obtain data from his machine.  His blood pressure has been low and recently his spironolactone dose was reduced from 25 mg down to 12.5 mg.  If blood pressure continues to be low he may need to change this to every other day rather than daily.  He had seen Dr. Diona Fanti yesterday and was taken off finasteride since he has apparent normal prostate size.  He continues to be on aspirin and rosuvastatin for hyperlipidemia.  He is on warfarin for his atrial fibrillation.  He is on [place].  He will follow-up with Dr. Domenic Polite.  He undergoes remote defibrillator checks.  His atrial fibrillation rate is controlled.   I will see him in several months for follow-up evaluation after his machine can get reconnected.   Medication Adjustments/Labs and Tests Ordered: Current medicines are reviewed at length with the patient today.  Concerns regarding medicines are outlined above.  Medication changes, Labs and Tests ordered today are listed in the Patient Instructions below. Patient Instructions  Medication  Instructions:  The current medical regimen is effective;  continue present plan and medications.  *If you need a refill on your cardiac medications before your next appointment, please call your pharmacy*   Follow-Up: At [facility], you and your health needs are our priority.  As part of our continuing mission to provide you with exceptional heart care, we have created designated Provider Care Teams.  These Care Teams include your primary Cardiologist (physician) and Advanced Practice Providers (APPs -  Physician Assistants and Nurse Practitioners) who all work together to provide you with the care you need, when you need it.  We recommend signing up for the patient portal called "MyChart".  Sign up information is provided on this After Visit Summary.  MyChart is used to connect with patients for Virtual Visits (Telemedicine).  Patients are able to view lab/test results, encounter notes, upcoming appointments, etc.  Non-urgent messages can be sent to your provider as well.   To learn more about what you can do with MyChart, go to [email or web address].    Your next appointment:   After we get machine connected.   The format for your next appointment:   In Person  Provider:   Shelva Majestic, MD  Go see [facility]- about your machine, attempt to get it connected so we can make adjustments to the machine. We would like to change from a EPAP of 8 to a EPAP min of 8 and EPAP max of 13. Continue with IPAP. If you have any questions please call us [phone].          Signed, Shelva Majestic, MD  05/19/2022 12:34 PM    [facility] [address], [place], [place], [place]  [address] Phone: [phone]

## 2022-05-18 NOTE — Patient Instructions (Addendum)
Medication Instructions:  The current medical regimen is effective;  continue present plan and medications.  *If you need a refill on your cardiac medications before your next appointment, please call your pharmacy*   Follow-Up: At [facility], you and your health needs are our priority.  As part of our continuing mission to provide you with exceptional heart care, we have created designated Provider Care Teams.  These Care Teams include your primary Cardiologist (physician) and Advanced Practice Providers (APPs -  Physician Assistants and Nurse Practitioners) who all work together to provide you with the care you need, when you need it.  We recommend signing up for the patient portal called "MyChart".  Sign up information is provided on this After Visit Summary.  MyChart is used to connect with patients for Virtual Visits (Telemedicine).  Patients are able to view lab/test results, encounter notes, upcoming appointments, etc.  Non-urgent messages can be sent to your provider as well.   To learn more about what you can do with MyChart, go to [email or web address].    Your next appointment:   After we get machine connected.   The format for your next appointment:   In Person  Provider:   Shelva Majestic, MD  Go see [facility]- about your machine, attempt to get it connected so we can make adjustments to the machine. We would like to change from a EPAP of 8 to a EPAP min of 8 and EPAP max of 13. Continue with IPAP. If you have any questions please call us [phone].

## 2022-05-19 ENCOUNTER — Encounter: Payer: Self-pay | Admitting: Cardiovascular Disease

## 2022-05-30 ENCOUNTER — Encounter: Payer: Self-pay | Admitting: Internal Medicine

## 2022-05-30 ENCOUNTER — Ambulatory Visit (INDEPENDENT_AMBULATORY_CARE_PROVIDER_SITE_OTHER): Payer: BC Managed Care – PPO

## 2022-05-30 DIAGNOSIS — I5022 Chronic systolic (congestive) heart failure: Secondary | ICD-10-CM | POA: Diagnosis not present

## 2022-05-30 DIAGNOSIS — Z95810 Presence of automatic (implantable) cardiac defibrillator: Secondary | ICD-10-CM | POA: Diagnosis not present

## 2022-05-31 ENCOUNTER — Ambulatory Visit: Payer: BC Managed Care – PPO | Attending: Cardiology | Admitting: *Deleted

## 2022-05-31 DIAGNOSIS — I4891 Unspecified atrial fibrillation: Secondary | ICD-10-CM | POA: Diagnosis not present

## 2022-05-31 DIAGNOSIS — I480 Paroxysmal atrial fibrillation: Secondary | ICD-10-CM

## 2022-05-31 DIAGNOSIS — Z5181 Encounter for therapeutic drug level monitoring: Secondary | ICD-10-CM | POA: Diagnosis not present

## 2022-05-31 LAB — POCT INR: INR: 2.1 (ref 2.0–3.0)

## 2022-05-31 NOTE — Patient Instructions (Signed)
Continue warfarin 1/2 tablet daily except 1 tablet on Tuesdays, Thursdays and Saturdays.  Recheck in 7 weeks. Continue greens Pt has decided to stay on warfarin for now. 

## 2022-06-01 NOTE — Progress Notes (Signed)
EPIC Encounter for ICM Monitoring  Patient Name: [name] is a 71 y.o. male Date: 06/01/2022 Primary Care Physican: [place] Nation, MD Primary Cardiologist: Domenic Polite Electrophysiologist: Mealor 01/13/2022 Weight: 220 lbs 03/21/2022 Weight:  218-219 lbs 04/26/2022 Weight: 220 lbs    Spoke with patient and heart failure questions reviewed.  Pt asymptomatic for fluid accumulation but has a slight cold.  He will be traveling to [place] next month and concerned about being at a higher altitude.  Advised will ask PA for any precautions needed.   Corvue Thoracic impedance suggesting the start of possible fluid accumulation the day of the report.      Prescribed:  Spironolactone 25 mg take 0.5 tablet (12.5 mg total) daily. Jardiance 10 mg take 1 tablet by mouth before breakfast   Recommendations: Advised to limit salt intake.   No changes and encouraged to call if experiencing any fluid symptoms.   Follow-up plan: ICM clinic phone appointment on 07/04/2022.   91 day device clinic remote transmission 08/18/2022.       EP/Cardiology Office visits:  07/26/2022 with Dr Myles Gip.   06/28/2022 with Dr Domenic Polite.   Copy of ICM check sent to Dr. Myles Gip.  3 month ICM trend: 05/31/2022.    12-14 Month ICM trend:     Rosalene Billings, RN 06/01/2022 11:24 AM

## 2022-06-01 NOTE — Progress Notes (Signed)
Attempted call to patient and left message advising the PA for EP said there are no extra precautions that he needs to take when in high altitude but do not overexert him self.

## 2022-06-06 ENCOUNTER — Other Ambulatory Visit: Payer: Self-pay | Admitting: Cardiology

## 2022-06-06 ENCOUNTER — Ambulatory Visit (INDEPENDENT_AMBULATORY_CARE_PROVIDER_SITE_OTHER): Payer: BC Managed Care – PPO

## 2022-06-06 DIAGNOSIS — I480 Paroxysmal atrial fibrillation: Secondary | ICD-10-CM | POA: Diagnosis not present

## 2022-06-07 ENCOUNTER — Other Ambulatory Visit: Payer: Self-pay | Admitting: Cardiology

## 2022-06-07 LAB — CUP PACEART REMOTE DEVICE CHECK
Battery Remaining Longevity: 59 mo
Battery Remaining Percentage: 57 %
Battery Voltage: 2.96 V
Brady Statistic RV Percent Paced: 1 %
Date Time Interrogation Session: 20230918125420
HighPow Impedance: 48 Ohm
HighPow Impedance: 48 Ohm
Implantable Lead Implant Date: 20020111
Implantable Lead Location: 753860
Implantable Lead Model: 148
Implantable Lead Serial Number: 116740
Implantable Pulse Generator Implant Date: 20180503
Lead Channel Impedance Value: 650 Ohm
Lead Channel Pacing Threshold Amplitude: 1 V
Lead Channel Pacing Threshold Pulse Width: 0.5 ms
Lead Channel Sensing Intrinsic Amplitude: 12 mV
Lead Channel Setting Pacing Amplitude: 2.5 V
Lead Channel Setting Pacing Pulse Width: 0.5 ms
Lead Channel Setting Sensing Sensitivity: 0.5 mV
Pulse Gen Serial Number: 7421148

## 2022-06-08 ENCOUNTER — Encounter: Payer: Self-pay | Admitting: *Deleted

## 2022-06-16 ENCOUNTER — Ambulatory Visit: Payer: BC Managed Care – PPO | Admitting: Cardiology

## 2022-06-21 NOTE — Progress Notes (Signed)
Remote ICD transmission.   

## 2022-06-22 ENCOUNTER — Ambulatory Visit: Payer: BC Managed Care – PPO | Attending: Cardiology

## 2022-06-22 DIAGNOSIS — I255 Ischemic cardiomyopathy: Secondary | ICD-10-CM | POA: Diagnosis not present

## 2022-06-22 LAB — ECHOCARDIOGRAM COMPLETE
AR max vel: 1.84 cm2
AV Peak grad: 5.4 mmHg
AV Vena cont: 0.39 cm
Ao pk vel: 1.16 m/s
Area-P 1/2: 4.02 cm2
Calc EF: 37.7 %
MV M vel: 3.91 m/s
MV Peak grad: 61 mmHg
P 1/2 time: 1328 msec
S' Lateral: 4.39 cm
Single Plane A2C EF: 32.5 %
Single Plane A4C EF: 40.8 %

## 2022-06-22 MED ORDER — PERFLUTREN LIPID MICROSPHERE
1.0000 mL | INTRAVENOUS | Status: AC | PRN
  Administered 2022-06-22: 2 mL via INTRAVENOUS

## 2022-06-23 ENCOUNTER — Telehealth: Payer: Self-pay | Admitting: *Deleted

## 2022-06-23 NOTE — Telephone Encounter (Signed)
Order for replacement BIPAP machine has been sent to [facility].

## 2022-06-24 ENCOUNTER — Encounter: Payer: BC Managed Care – PPO | Admitting: Internal Medicine

## 2022-06-27 NOTE — Progress Notes (Unsigned)
Cardiology Office Note  Date: 06/27/2022   ID: [name], DOB January 12, 1951, MRN [id number]  PCP:  [place] Nation, MD  Cardiologist:  Rozann Lesches, MD Electrophysiologist:  Thompson Grayer, MD   No chief complaint on file.   History of Present Illness: [name] is a 71 y.o. male last seen in April.  St. Jude ICD in place with follow-up by EP.  He is on Coumadin with follow-up in anticoagulation clinic.  Recent follow-up echocardiogram showed LVEF 30 to 35% range, slow flow at the LV apex without mural thrombus, normal RV contraction, mild mitral regurgitation, and aortic valve sclerosis without stenosis.  He had a visit with Dr. [name] in August for follow-up of OSA.  Past Medical History:  Diagnosis Date   AICD (automatic cardioverter/defibrillator) present 2006   ICD   Anterior myocardial infarction ([place])    BCC (basal cell carcinoma) superficial 11/22/2016   Left back sholder   Chronic systolic heart failure ([place])    Coronary atherosclerosis of native coronary artery    a. s/p BMS to LAD and OM1 b. 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[place])    Ischemic cardiomyopathy    LVEF 30%, status post ICD   OSA (obstructive sleep apnea)    unable to tolerate CPAP   Paroxysmal atrial fibrillation ([facility])    On coumadin   Pseudoaneurysm ([facility])    SCC (squamous cell carcinoma) 08/04/2006   right shoulder (CX35FU)   SCC (squamous cell carcinoma) well differentiated 12/13/2011   right temple   Squamous cell carcinoma in situ (SCCIS) 08/04/2006   right sholder   Squamous cell carcinoma of skin 11/09/2020   in situ- left upper back   Squamous cell carcinoma of skin 11/09/2020   KA-right dorsal hand   Stroke [facility])     Past Surgical History:  Procedure Laterality Date   AICD implantation  2011   St Jude ICD implanted for primary prevention of sudden death   BIOPSY  2020-05-07    Procedure: BIOPSY;  Surgeon: [name], MD;  [facility];  Service: Endoscopy;;   CATARACT EXTRACTION W/PHACO Left 03/27/2017   Procedure: CATARACT EXTRACTION PHACO AND INTRAOCULAR LENS PLACEMENT ([place]);  Surgeon: [name], MD;  Location: [facility] ORS;  Service: Ophthalmology;  Laterality: Left;  CDE: 8.48   CATARACT EXTRACTION W/PHACO Right 05/29/2017   Procedure: CATARACT EXTRACTION PHACO AND INTRAOCULAR LENS PLACEMENT ([facility]);  Surgeon: [name], MD;  Location: [facility] ORS;  Service: Ophthalmology;  Laterality: Right;  CDE: 7.48   COLONOSCOPY N/A 2020-05-07   Scattered medium-mouthed diverticula in sigmoid, three sessile polyps in hepatic flexure and cecum. 5-7 mm in size. Tubular adenomas. Benign colonic mucosa. 3 year surveillance.    EYE SURGERY     ICD GENERATOR CHANGEOUT N/A 01/19/2017   SJM Ellipse VR ICD generator change by Dr [name]   LAPAROSCOPY N/A 10/23/2017   Procedure: LAPAROSCOPY DIAGNOSTIC;  Surgeon: [name], MD;  Location: [place];  Service: General;  Laterality: N/A;   LEFT HEART CATH AND CORONARY ANGIOGRAPHY N/A 06/21/2018   Procedure: LEFT HEART CATH AND CORONARY ANGIOGRAPHY;  Surgeon: [name], MD;  Location: [place] CV LAB;  Service: Cardiovascular;  Laterality: N/A;   POLYPECTOMY  2020/05/07   Procedure: POLYPECTOMY;  Surgeon: [name], MD;  [facility];  Service: Endoscopy;;   VENTRAL HERNIA REPAIR N/A 10/23/2017  Procedure: VENTRAL HERNIA REPAIR;  Surgeon: [name], MD;  Location: [place];  Service: General;  Laterality: N/A;    Current Outpatient Medications  Medication Sig Dispense Refill   aspirin 81 MG tablet Take 81 mg by mouth daily.     bisoprolol (ZEBETA) 5 MG tablet TAKE 1 TABLET (5 MG TOTAL) BY MOUTH DAILY. 90 tablet 2   dicyclomine (BENTYL) 10 MG capsule Take 1 capsule (10 mg total) by mouth 4 (four) times daily -  before meals and at bedtime. For frequent stools. Start off with just twice a day. 90 capsule 0    ENTRESTO 24-26 MG TAKE 1 TABLET BY MOUTH TWICE A DAY 180 tablet 3   finasteride (PROSCAR) 5 MG tablet TAKE 1 TABLET BY MOUTH EVERY DAY 90 tablet 3   JARDIANCE 10 MG TABS tablet TAKE 1 TABLET BY MOUTH DAILY BEFORE BREAKFAST. 30 tablet 11   lipase/protease/amylase (CREON) 36000 UNITS CPEP capsule Take 2 capsules (72,000 Units total) by mouth 3 (three) times daily with meals. May also take 1 capsule (36,000 Units total) as needed (with snacks). 240 capsule 11   nitroGLYCERIN (NITROSTAT) 0.4 MG SL tablet Place 1 tablet (0.4 mg total) under the tongue every 5 (five) minutes as needed for chest pain. 25 tablet 3   rosuvastatin (CRESTOR) 20 MG tablet TAKE 1 TABLET (20 MG TOTAL) BY MOUTH DAILY. 90 tablet 3   spironolactone (ALDACTONE) 25 MG tablet TAKE 1 TABLET BY MOUTH EVERY DAY (Patient taking differently: Take 12.5 mg by mouth daily.) 90 tablet 1   warfarin (COUMADIN) 5 MG tablet TAKE 1/2 TABLET DAILY EXCEPT 1 TABLET ON SUNDAYS, TUESDAYS AND THURSDAYS OR AS DIRECTED 90 tablet 3   No current facility-administered medications for this visit.   Allergies:  Patient has no known allergies.   Social History: The patient  reports that he has never smoked. He has never used smokeless tobacco. He reports current alcohol use. He reports that he does not use drugs.   Family History: The patient's family history includes Cancer in his mother; Heart attack in his father; Heart disease in his brother.   ROS:  Please see the history of present illness. Otherwise, complete review of systems is positive for {NONE DEFAULTED:18576}.  All other systems are reviewed and negative.   Physical Exam: VS:  There were no vitals taken for this visit., BMI There is no height or weight on file to calculate BMI.  Wt Readings from Last 3 Encounters:  05/18/22 226 lb 12.8 oz (102.9 kg)  02/21/22 227 lb 9.6 oz (103.2 kg)  12/23/21 226 lb (102.5 kg)    General: Patient appears comfortable at rest. HEENT: Conjunctiva and lids  normal, oropharynx clear with moist mucosa. Neck: Supple, no elevated JVP or carotid bruits, no thyromegaly. Lungs: Clear to auscultation, nonlabored breathing at rest. Cardiac: Regular rate and rhythm, no S3 or significant systolic murmur, no pericardial rub. Abdomen: Soft, nontender, no hepatomegaly, bowel sounds present, no guarding or rebound. Extremities: No pitting edema, distal pulses 2+. Skin: Warm and dry. Musculoskeletal: No kyphosis. Neuropsychiatric: Alert and oriented x3, affect grossly appropriate.  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  Recent Labwork:  06/17/2021: BUN 22; Creatinine, Ser 1.17; Potassium 4.4; Sodium 138   Other Studies Reviewed Today:  Echocardiogram 06/22/2022:  1. Left ventricular ejection fraction, by estimation, is 30 to 35%. The  left ventricle has  moderately decreased function. The left ventricle  demonstrates regional wall motion abnormalities (see scoring  diagram/findings for description). The left  ventricular internal cavity size was mildly dilated. Left ventricular  diastolic parameters are indeterminate. The average left ventricular  global longitudinal strain is -6.9 %. The global longitudinal strain is  abnormal.   2. Slow flow at LV apex without formed mural thormbus by Definity  contrast.   3. Right ventricular systolic function is normal. The right ventricular  size is normal. The estimated right ventricular systolic pressure is 74.1  mmHg.   4. Left atrial size was mildly dilated.   5. The mitral valve is abnormal. Mild mitral valve regurgitation.   6. The aortic valve is tricuspid. There is mild calcification of the  aortic valve. Aortic valve regurgitation is trivial. Aortic valve  sclerosis/calcification is present, without any evidence of aortic  stenosis.   7. The inferior vena cava is normal in  size with greater than 50%  respiratory variability, suggesting right atrial pressure of 3 mmHg.   Assessment and Plan:    Medication Adjustments/Labs and Tests Ordered: Current medicines are reviewed at length with the patient today.  Concerns regarding medicines are outlined above.   Tests Ordered: No orders of the defined types were placed in this encounter.   Medication Changes: No orders of the defined types were placed in this encounter.   Disposition:  Follow up {follow up:15908}  Signed, Satira Sark, MD, [facility] 06/27/2022 10:16 AM    [facility] [place], [place], [place] [address] Phone: [phone]; Fax: [phone]

## 2022-06-28 ENCOUNTER — Ambulatory Visit: Payer: BC Managed Care – PPO | Attending: Cardiology | Admitting: Cardiology

## 2022-06-28 ENCOUNTER — Encounter: Payer: Self-pay | Admitting: Cardiology

## 2022-06-28 ENCOUNTER — Encounter: Payer: Self-pay | Admitting: *Deleted

## 2022-06-28 VITALS — BP 92/72 | HR 75 | Ht 72.0 in | Wt 226.2 lb

## 2022-06-28 DIAGNOSIS — I25119 Atherosclerotic heart disease of native coronary artery with unspecified angina pectoris: Secondary | ICD-10-CM

## 2022-06-28 DIAGNOSIS — I4819 Other persistent atrial fibrillation: Secondary | ICD-10-CM | POA: Diagnosis not present

## 2022-06-28 DIAGNOSIS — I5020 Unspecified systolic (congestive) heart failure: Secondary | ICD-10-CM

## 2022-06-28 DIAGNOSIS — I255 Ischemic cardiomyopathy: Secondary | ICD-10-CM

## 2022-06-28 NOTE — Patient Instructions (Addendum)

## 2022-07-01 ENCOUNTER — Other Ambulatory Visit: Payer: Self-pay | Admitting: Cardiology

## 2022-07-02 ENCOUNTER — Other Ambulatory Visit: Payer: Self-pay | Admitting: Cardiology

## 2022-07-04 ENCOUNTER — Ambulatory Visit (INDEPENDENT_AMBULATORY_CARE_PROVIDER_SITE_OTHER): Payer: BC Managed Care – PPO

## 2022-07-04 DIAGNOSIS — Z95810 Presence of automatic (implantable) cardiac defibrillator: Secondary | ICD-10-CM | POA: Diagnosis not present

## 2022-07-04 DIAGNOSIS — I5022 Chronic systolic (congestive) heart failure: Secondary | ICD-10-CM | POA: Diagnosis not present

## 2022-07-08 ENCOUNTER — Telehealth: Payer: Self-pay

## 2022-07-08 NOTE — Progress Notes (Signed)
EPIC Encounter for ICM Monitoring  Patient Name: [name] is a 71 y.o. male Date: 07/08/2022 Primary Care Physican:  Nation, MD Primary Cardiologist: Domenic Polite Electrophysiologist: Mealor 01/13/2022 Weight: 220 lbs 03/21/2022 Weight:  218-219 lbs 04/26/2022 Weight: 220 lbs     Attempted call to patient and unable to reach.  Left detailed message per DPR regarding transmission. Transmission reviewed.    Corvue Thoracic impedance suggesting normal fluid levels.      Prescribed:  Spironolactone 25 mg take 0.5 tablet (12.5 mg total) daily. Jardiance 10 mg take 1 tablet by mouth before breakfast   Recommendations: Left voice mail with ICM number and encouraged to call if experiencing any fluid symptoms.   Follow-up plan: ICM clinic phone appointment on 08/08/2022.   91 day device clinic remote transmission 08/18/2022.       EP/Cardiology Office visits:  07/26/2022 with Dr Myles Gip.   01/16/2023 with Dr Domenic Polite.   Copy of ICM check sent to Dr. Myles Gip.   3 month ICM trend: 07/04/2022.    12-14 Month ICM trend:     Rosalene Billings, RN 07/08/2022 3:38 PM

## 2022-07-08 NOTE — Telephone Encounter (Signed)
Remote ICM transmission received.  Attempted call to patient regarding ICM remote transmission and left detailed message per DPR.  Advised to return call for any fluid symptoms or questions. Next ICM remote transmission scheduled 08/08/2022.

## 2022-07-19 ENCOUNTER — Ambulatory Visit: Payer: BC Managed Care – PPO | Attending: Cardiology | Admitting: *Deleted

## 2022-07-19 DIAGNOSIS — Z5181 Encounter for therapeutic drug level monitoring: Secondary | ICD-10-CM | POA: Diagnosis not present

## 2022-07-19 DIAGNOSIS — I480 Paroxysmal atrial fibrillation: Secondary | ICD-10-CM

## 2022-07-19 DIAGNOSIS — I4891 Unspecified atrial fibrillation: Secondary | ICD-10-CM

## 2022-07-19 LAB — POCT INR: INR: 2.7 (ref 2.0–3.0)

## 2022-07-19 NOTE — Patient Instructions (Signed)
Continue warfarin 1/2 tablet daily except 1 tablet on Tuesdays, Thursdays and Saturdays.  Recheck in 7 weeks. Continue greens Pt has decided to stay on warfarin for now.

## 2022-07-26 ENCOUNTER — Ambulatory Visit: Payer: BC Managed Care – PPO | Attending: Internal Medicine | Admitting: Cardiovascular Disease

## 2022-07-26 ENCOUNTER — Encounter: Payer: Self-pay | Admitting: Cardiovascular Disease

## 2022-07-26 VITALS — BP 92/58 | HR 61 | Ht 72.0 in | Wt 226.4 lb

## 2022-07-26 DIAGNOSIS — I255 Ischemic cardiomyopathy: Secondary | ICD-10-CM

## 2022-07-26 DIAGNOSIS — Z95810 Presence of automatic (implantable) cardiac defibrillator: Secondary | ICD-10-CM

## 2022-07-26 NOTE — Patient Instructions (Signed)
Medication Instructions:  Continue all current medications.  Labwork: none  Testing/Procedures: none  Follow-Up: 1 year - Dr.  Mealor    Any Other Special Instructions Will Be Listed Below (If Applicable).   If you need a refill on your cardiac medications before your next appointment, please call your pharmacy.  

## 2022-07-26 NOTE — Progress Notes (Signed)
PCP: [place] Nation, MD Primary Cardiologist: Dr Domenic Polite Primary EP: Dr Tana Coast is a 71 y.o. male who presents today for routine electrophysiology followup.  He has a St Jude/Abbott ICD implanted for primary prevention.  Since last being seen in our clinic, the patient reports doing very well.    Today, he denies symptoms of palpitations, chest pain, shortness of breath,  lower extremity edema, dizziness, presyncope, syncope, or ICD shocks. he has no device related complaints -- no new tenderness, drainage, redness.  The patient is otherwise without complaint today.   Past Medical History:  Diagnosis Date   AICD (automatic cardioverter/defibrillator) present 2006   ICD   Anterior myocardial infarction ([place])    BCC (basal cell carcinoma) superficial 11/22/2016   Left back sholder   Chronic systolic heart failure ([place])    Coronary atherosclerosis of native coronary artery    a. s/p BMS to LAD and OM1 b. 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[place])    Ischemic cardiomyopathy    LVEF 30%, status post ICD   OSA (obstructive sleep apnea)    unable to tolerate CPAP   Paroxysmal atrial fibrillation ([facility])    On coumadin   Pseudoaneurysm ([facility])    SCC (squamous cell carcinoma) 08/04/2006   right shoulder (CX35FU)   SCC (squamous cell carcinoma) well differentiated 12/13/2011   right temple   Squamous cell carcinoma in situ (SCCIS) 08/04/2006   right sholder   Squamous cell carcinoma of skin 11/09/2020   in situ- left upper back   Squamous cell carcinoma of skin 11/09/2020   KA-right dorsal hand   Stroke [facility])    Past Surgical History:  Procedure Laterality Date   AICD implantation  2011   St Jude ICD implanted for primary prevention of sudden death   BIOPSY  05/13/2020   Procedure: BIOPSY;  Surgeon: [name], MD;  [facility];  Service: Endoscopy;;   CATARACT  EXTRACTION W/PHACO Left 03/27/2017   Procedure: CATARACT EXTRACTION PHACO AND INTRAOCULAR LENS PLACEMENT ([place]);  Surgeon: [name], MD;  Location[facility];  Service: Ophthalmology;  Laterality: Left;  CDE: 8.48   CATARACT EXTRACTION W/PHACO Right 05/29/2017   Procedure: CATARACT EXTRACTION PHACO AND INTRAOCULAR LENS PLACEMENT ([facility]);  Surgeon: [name], MD;  Location: [facility];  Service: Ophthalmology;  Laterality: Right;  CDE: 7.48   COLONOSCOPY N/A May 13, 2020   Scattered medium-mouthed diverticula in sigmoid, three sessile polyps in hepatic flexure and cecum. 5-7 mm in size. Tubular adenomas. Benign colonic mucosa. 3 year surveillance.    EYE SURGERY     ICD GENERATOR CHANGEOUT N/A 01/19/2017   SJM Ellipse VR ICD generator change by Dr [name]   LAPAROSCOPY N/A 10/23/2017   Procedure: LAPAROSCOPY DIAGNOSTIC;  Surgeon: [name], MD;  Location: [place];  Service: General;  Laterality: N/A;   LEFT HEART CATH AND CORONARY ANGIOGRAPHY N/A 06/21/2018   Procedure: LEFT HEART CATH AND CORONARY ANGIOGRAPHY;  Surgeon: [name], MD;  Location: [facility];  Service: Cardiovascular;  Laterality: N/A;   POLYPECTOMY  05/13/2020   Procedure: POLYPECTOMY;  Surgeon: [name], MD;  [facility];  Service: Endoscopy;;   VENTRAL HERNIA REPAIR N/A 10/23/2017   Procedure: VENTRAL HERNIA REPAIR;  Surgeon: [name], MD;  Location: [place];  Service: General;  Laterality: N/A;    ROS- all systems are  reviewed and negative except as per HPI above  Current Outpatient Medications  Medication Sig Dispense Refill   aspirin 81 MG tablet Take 81 mg by mouth daily.     bisoprolol (ZEBETA) 5 MG tablet TAKE 1 TABLET (5 MG TOTAL) BY MOUTH DAILY. 90 tablet 2   dicyclomine (BENTYL) 10 MG capsule Take 1 capsule (10 mg total) by mouth 4 (four) times daily -  before meals and at bedtime. For frequent stools. Start off with just twice a day. 90 capsule 0   ENTRESTO 24-26 MG TAKE 1 TABLET BY  MOUTH TWICE A DAY 180 tablet 3   JARDIANCE 10 MG TABS tablet TAKE 1 TABLET BY MOUTH DAILY BEFORE BREAKFAST. 30 tablet 11   lipase/protease/amylase (CREON) 36000 UNITS CPEP capsule Take 2 capsules (72,000 Units total) by mouth 3 (three) times daily with meals. May also take 1 capsule (36,000 Units total) as needed (with snacks). 240 capsule 11   nitroGLYCERIN (NITROSTAT) 0.4 MG SL tablet Place 1 tablet (0.4 mg total) under the tongue every 5 (five) minutes as needed for chest pain. 25 tablet 3   rosuvastatin (CRESTOR) 20 MG tablet TAKE 1 TABLET BY MOUTH EVERY DAY 90 tablet 3   spironolactone (ALDACTONE) 25 MG tablet Take 12.5 mg by mouth daily.     warfarin (COUMADIN) 5 MG tablet TAKE 1/2 TABLET DAILY EXCEPT 1 TABLET ON TUESDAYS, THURSDAYS AND SATURDAYS OR AS DIRECTED 90 tablet 3   No current facility-administered medications for this visit.    Physical Exam: Vitals:   07/26/22 0956  BP: (!) 92/58  Pulse: 61  SpO2: 94%  Weight: 226 lb 6.4 oz (102.7 kg)  Height: 6' (1.829 m)     GEN- The patient is well appearing, alert and oriented x 3 today.   Head- normocephalic, atraumatic Eyes-  Sclera clear, conjunctiva pink Ears- hearing intact Oropharynx- clear Lungs- Clear to ausculation bilaterally, normal work of breathing Chest- ICD pocket is well healed Heart- Regular rate and rhythm, no murmurs, rubs or gallops, PMI not laterally displaced GI- soft, NT, ND, + BS Extremities- no clubbing, cyanosis, or edema  ICD interrogation- reviewed in detail today,  See PACEART report   Wt Readings from Last 3 Encounters:  06/28/22 226 lb 3.2 oz (102.6 kg)  05/18/22 226 lb 12.8 oz (102.9 kg)  02/21/22 227 lb 9.6 oz (103.2 kg)    Assessment and Plan:  1.  Chronic systolic dysfunction/ CAD/ ischemic CM euvolemic today I reviewed TTE report from 06/22/2022 Stable on an appropriate medical regimen Normal ICD function See Pace Art report No changes today he is not device dependant today; <  1% V-paced followed in ICM device clinic  2. Persistent afib Rate controlled Chads2vasc score is 6. On coumadin    Risks, benefits and potential toxicities for medications prescribed and/or refilled reviewed with patient today.   Return in a year  Melida Quitter, MD  07/26/2022 9:34 AM

## 2022-08-06 ENCOUNTER — Other Ambulatory Visit: Payer: Self-pay | Admitting: Urology

## 2022-08-06 DIAGNOSIS — N401 Enlarged prostate with lower urinary tract symptoms: Secondary | ICD-10-CM

## 2022-08-08 ENCOUNTER — Ambulatory Visit (INDEPENDENT_AMBULATORY_CARE_PROVIDER_SITE_OTHER): Payer: BC Managed Care – PPO

## 2022-08-08 DIAGNOSIS — I5022 Chronic systolic (congestive) heart failure: Secondary | ICD-10-CM

## 2022-08-08 DIAGNOSIS — Z95810 Presence of automatic (implantable) cardiac defibrillator: Secondary | ICD-10-CM | POA: Diagnosis not present

## 2022-08-10 NOTE — Progress Notes (Signed)
EPIC Encounter for ICM Monitoring  Patient Name: [name] is a 71 y.o. male Date: 08/10/2022 Primary Care Physican: [place] Nation, MD Primary Cardiologist: Domenic Polite Electrophysiologist: Mealor 01/13/2022 Weight: 220 lbs 03/21/2022 Weight:  218-219 lbs 04/26/2022 Weight: 220 lbs 08/10/2022 Weight: 218 lbs   Spoke with patient and heart failure questions reviewed.  Transmission results reviewed.  Pt asymptomatic for fluid accumulation.  Reports feeling well at this time and voices no complaints.     Corvue Thoracic impedance suggesting normal fluid levels.      Prescribed:  Spironolactone 25 mg take 0.5 tablet (12.5 mg total) daily. Jardiance 10 mg take 1 tablet by mouth before breakfast   Recommendations: No changes and encouraged to call if experiencing any fluid symptoms.   Follow-up plan: ICM clinic phone appointment on 09/13/2022.   91 day device clinic remote transmission 09/05/2022.       EP/Cardiology Office visits:  Recall 07/26/2023. with Dr Myles Gip.   01/16/2023 with Dr Domenic Polite.   Copy of ICM check sent to Dr. Myles Gip.    3 month ICM trend: 08/08/2022.    12-14 Month ICM trend:     Rosalene Billings, RN 08/10/2022 1:35 PM

## 2022-08-16 ENCOUNTER — Ambulatory Visit: Payer: BC Managed Care – PPO | Admitting: Urology

## 2022-08-31 DIAGNOSIS — H01004 Unspecified blepharitis left upper eyelid: Secondary | ICD-10-CM | POA: Diagnosis not present

## 2022-08-31 DIAGNOSIS — H01001 Unspecified blepharitis right upper eyelid: Secondary | ICD-10-CM | POA: Diagnosis not present

## 2022-08-31 DIAGNOSIS — H01002 Unspecified blepharitis right lower eyelid: Secondary | ICD-10-CM | POA: Diagnosis not present

## 2022-08-31 DIAGNOSIS — H01005 Unspecified blepharitis left lower eyelid: Secondary | ICD-10-CM | POA: Diagnosis not present

## 2022-09-05 ENCOUNTER — Ambulatory Visit (INDEPENDENT_AMBULATORY_CARE_PROVIDER_SITE_OTHER): Payer: BC Managed Care – PPO

## 2022-09-05 DIAGNOSIS — I255 Ischemic cardiomyopathy: Secondary | ICD-10-CM | POA: Diagnosis not present

## 2022-09-05 DIAGNOSIS — I5022 Chronic systolic (congestive) heart failure: Secondary | ICD-10-CM

## 2022-09-06 ENCOUNTER — Ambulatory Visit: Payer: BC Managed Care – PPO | Attending: Cardiology | Admitting: *Deleted

## 2022-09-06 DIAGNOSIS — Z5181 Encounter for therapeutic drug level monitoring: Secondary | ICD-10-CM | POA: Diagnosis not present

## 2022-09-06 DIAGNOSIS — I480 Paroxysmal atrial fibrillation: Secondary | ICD-10-CM

## 2022-09-06 LAB — POCT INR: INR: 3.7 — AB (ref 2.0–3.0)

## 2022-09-06 LAB — CUP PACEART REMOTE DEVICE CHECK
Battery Remaining Longevity: 56 mo
Battery Remaining Percentage: 55 %
Battery Voltage: 2.95 V
Brady Statistic RV Percent Paced: 1 %
Date Time Interrogation Session: 20231218202907
HighPow Impedance: 49 Ohm
HighPow Impedance: 49 Ohm
Implantable Lead Connection Status: 753985
Implantable Lead Implant Date: 20020111
Implantable Lead Location: 753860
Implantable Lead Model: 148
Implantable Lead Serial Number: 116740
Implantable Pulse Generator Implant Date: 20180503
Lead Channel Impedance Value: 650 Ohm
Lead Channel Pacing Threshold Amplitude: 1.25 V
Lead Channel Pacing Threshold Pulse Width: 0.5 ms
Lead Channel Sensing Intrinsic Amplitude: 12 mV
Lead Channel Setting Pacing Amplitude: 2.5 V
Lead Channel Setting Pacing Pulse Width: 0.5 ms
Lead Channel Setting Sensing Sensitivity: 0.5 mV
Pulse Gen Serial Number: 7421148

## 2022-09-06 NOTE — Patient Instructions (Signed)
Hold warfarin tonight then resume 1/2 tablet daily except 1 tablet on Tuesdays, Thursdays and Saturdays.  Recheck in 4 weeks. Continue greens Pt has decided to stay on warfarin for now. 

## 2022-09-13 ENCOUNTER — Ambulatory Visit (INDEPENDENT_AMBULATORY_CARE_PROVIDER_SITE_OTHER): Payer: BC Managed Care – PPO

## 2022-09-13 DIAGNOSIS — Z95810 Presence of automatic (implantable) cardiac defibrillator: Secondary | ICD-10-CM

## 2022-09-13 DIAGNOSIS — I5022 Chronic systolic (congestive) heart failure: Secondary | ICD-10-CM

## 2022-09-14 NOTE — Progress Notes (Signed)
EPIC Encounter for ICM Monitoring  Patient Name: [name] is a 71 y.o. male Date: 09/14/2022 Primary Care Physican: [place] Nation, MD Primary Cardiologist: Domenic Polite Electrophysiologist: Mealor 01/13/2022 Weight: 220 lbs 03/21/2022 Weight:  218-219 lbs 04/26/2022 Weight: 220 lbs 08/10/2022 Weight: 218 lbs   Spoke with patient and heart failure questions reviewed.  Transmission results reviewed.  Pt asymptomatic for fluid accumulation.  Reports feeling well at this time and voices no complaints.      Corvue Thoracic impedance suggesting normal fluid levels with exception of possible fluid accumulation from 11/26-12/5 and 12/15-12/21.   Prescribed:  Spironolactone 25 mg take 0.5 tablet (12.5 mg total) daily. Jardiance 10 mg take 1 tablet by mouth before breakfast   Recommendations:  No changes and encouraged to call if experiencing any fluid symptoms.   Follow-up plan: ICM clinic phone appointment on 10/17/2022.   91 day device clinic remote transmission 12/05/2022.       EP/Cardiology Office visits:  Recall 07/26/2023. with Dr Myles Gip.   01/16/2023 with Dr Domenic Polite.   Copy of ICM check sent to Dr. Myles Gip.    3 month ICM trend: 09/13/2022.    12-14 Month ICM trend:     Rosalene Billings, RN 09/14/2022 10:26 AM

## 2022-10-04 ENCOUNTER — Ambulatory Visit: Payer: Commercial Managed Care - PPO | Attending: Cardiology | Admitting: *Deleted

## 2022-10-04 DIAGNOSIS — I480 Paroxysmal atrial fibrillation: Secondary | ICD-10-CM

## 2022-10-04 DIAGNOSIS — Z5181 Encounter for therapeutic drug level monitoring: Secondary | ICD-10-CM

## 2022-10-04 LAB — POCT INR: INR: 2.1 (ref 2.0–3.0)

## 2022-10-04 NOTE — Patient Instructions (Signed)
Continue warfarin 1/2 tablet daily except 1 tablet on Tuesdays, Thursdays and Saturdays.  Recheck in 4 weeks. Continue greens Pt has decided to stay on warfarin for now.

## 2022-10-12 NOTE — Progress Notes (Signed)
Remote ICD transmission.   

## 2022-10-21 ENCOUNTER — Ambulatory Visit: Payer: Commercial Managed Care - PPO | Attending: Cardiovascular Disease

## 2022-10-21 DIAGNOSIS — Z95810 Presence of automatic (implantable) cardiac defibrillator: Secondary | ICD-10-CM | POA: Diagnosis not present

## 2022-10-21 DIAGNOSIS — I5022 Chronic systolic (congestive) heart failure: Secondary | ICD-10-CM

## 2022-10-21 NOTE — Progress Notes (Signed)
EPIC Encounter for ICM Monitoring  Patient Name: [name] is a 72 y.o. male Date: 10/21/2022 Primary Care Physican: Torrington Nation, MD Primary Cardiologist: Domenic Polite Electrophysiologist: Mealor 01/13/2022 Weight: 220 lbs 03/21/2022 Weight:  218-219 lbs 04/26/2022 Weight: 220 lbs 08/10/2022 Weight: 218 lbs 10/21/2022 Weight: 218-220 lbs   Spoke with patient and heart failure questions reviewed.  Transmission results reviewed.  Pt asymptomatic for fluid accumulation.  Reports feeling well at this time and voices no complaints.     Corvue Thoracic impedance suggesting normal fluid levels with exception of possible fluid accumulation from 1/5-1/19.   Prescribed:  Spironolactone 25 mg take 0.5 tablet (12.5 mg total) daily. Jardiance 10 mg take 1 tablet by mouth before breakfast   Recommendations:  No changes and encouraged to call if experiencing any fluid symptoms.   Follow-up plan: ICM clinic phone appointment on 11/21/2022.   91 day device clinic remote transmission 12/05/2022.       EP/Cardiology Office visits:  Recall 07/26/2023. with Dr Myles Gip.   01/16/2023 with Dr Domenic Polite.   Copy of ICM check sent to Dr. Myles Gip.    3 month ICM trend: 10/17/2022.    12-14 Month ICM trend:     Rosalene Billings, RN 10/21/2022 7:05 AM

## 2022-11-01 ENCOUNTER — Ambulatory Visit: Payer: Commercial Managed Care - PPO | Attending: Cardiology | Admitting: *Deleted

## 2022-11-01 DIAGNOSIS — I480 Paroxysmal atrial fibrillation: Secondary | ICD-10-CM

## 2022-11-01 DIAGNOSIS — Z5181 Encounter for therapeutic drug level monitoring: Secondary | ICD-10-CM

## 2022-11-01 LAB — POCT INR: INR: 3.2 — AB (ref 2.0–3.0)

## 2022-11-01 NOTE — Patient Instructions (Signed)
Hold warfarin tonight then resume 1/2 tablet daily except 1 tablet on Tuesdays, Thursdays and Saturdays.  Recheck in 4 weeks. Continue greens Pt has decided to stay on warfarin for now.

## 2022-11-21 ENCOUNTER — Telehealth: Payer: Self-pay

## 2022-11-21 ENCOUNTER — Ambulatory Visit: Payer: Commercial Managed Care - PPO | Attending: Cardiovascular Disease

## 2022-11-21 DIAGNOSIS — Z95810 Presence of automatic (implantable) cardiac defibrillator: Secondary | ICD-10-CM

## 2022-11-21 DIAGNOSIS — I5022 Chronic systolic (congestive) heart failure: Secondary | ICD-10-CM

## 2022-11-21 NOTE — Progress Notes (Unsigned)
EPIC Encounter for ICM Monitoring  Patient Name: [name] is a 72 y.o. male Date: 11/21/2022 Primary Care Physican:  Nation, MD Primary Cardiologist: Domenic Polite Electrophysiologist: Mealor 01/13/2022 Weight: 220 lbs 03/21/2022 Weight:  218-219 lbs 04/26/2022 Weight: 220 lbs 08/10/2022 Weight: 218 lbs 10/21/2022 Weight: 218-220 lbs   Attempted call to patient and unable to reach.  Left detailed message per DPR regarding transmission. Transmission reviewed.     Corvue Thoracic impedance suggesting possible fluid accumulation starting 3/2.   Prescribed:  Spironolactone 25 mg take 0.5 tablet (12.5 mg total) daily. Jardiance 10 mg take 1 tablet by mouth before breakfast   Recommendations:  Left voice mail with ICM number and encouraged to call if experiencing any fluid symptoms.   Follow-up plan: ICM clinic phone appointment on 11/28/2022 to recheck fluid levels.   91 day device clinic remote transmission 12/05/2022.       EP/Cardiology Office visits:  Recall 07/26/2023. with Dr Myles Gip.   01/16/2023 with Dr Domenic Polite.   Copy of ICM check sent to Dr. Myles Gip.  Will send to Dr Domenic Polite for review if patient is reached.   3 month ICM trend: 11/21/2022.    12-14 Month ICM trend:     Rosalene Billings, RN 11/21/2022 3:01 PM

## 2022-11-21 NOTE — Telephone Encounter (Signed)
Remote ICM transmission received.  Attempted call to patient regarding ICM remote transmission and left detailed message per DPR.  Advised to return call for any fluid symptoms or questions. Next ICM remote transmission scheduled 11/28/2022.

## 2022-11-28 ENCOUNTER — Ambulatory Visit: Payer: Commercial Managed Care - PPO | Attending: Cardiovascular Disease

## 2022-11-28 ENCOUNTER — Telehealth: Payer: Self-pay

## 2022-11-28 DIAGNOSIS — I5022 Chronic systolic (congestive) heart failure: Secondary | ICD-10-CM

## 2022-11-28 DIAGNOSIS — Z95810 Presence of automatic (implantable) cardiac defibrillator: Secondary | ICD-10-CM

## 2022-11-28 NOTE — Telephone Encounter (Signed)
Spoke with patient and assisted in sending manual remote transmission for fluid level review.

## 2022-11-28 NOTE — Progress Notes (Signed)
EPIC Encounter for ICM Monitoring  Patient Name: [name] is a 72 y.o. male Date: 11/28/2022 Primary Care Physican: Brinkley Nation, MD Primary Cardiologist: Domenic Polite Electrophysiologist: Mealor 01/13/2022 Weight: 220 lbs 03/21/2022 Weight:  218-219 lbs 04/26/2022 Weight: 220 lbs 08/10/2022 Weight: 218 lbs 10/21/2022 Weight: 218-220 lbs   ASpoke with patient and heart failure questions reviewed.  Transmission results reviewed.  Pt asymptomatic for fluid accumulation.  Reports feeling well at this time and voices no complaints.   He reports traveling some during decreased impedance and eating differently than normal   Corvue Thoracic impedance suggesting possible fluid accumulation starting 3/2 returned to baseline followed by return of possible fluid accumulation over the weekend.   Prescribed:  Spironolactone 25 mg take 0.5 tablet (12.5 mg total) daily. Jardiance 10 mg take 1 tablet by mouth before breakfast   Recommendations:  Advised to limit salt intake and call for any fluid symptoms.    Follow-up plan: ICM clinic phone appointment on 12/26/2022.   91 day device clinic remote transmission 12/05/2022.       EP/Cardiology Office visits:  Recall 07/26/2023. with Dr Myles Gip.   01/16/2023 with Dr Domenic Polite.   Copy of ICM check sent to Dr. Myles Gip.   3 month ICM trend: 11/28/2022.    12-14 Month ICM trend:     Rosalene Billings, RN 11/28/2022 10:31 AM

## 2022-12-05 ENCOUNTER — Ambulatory Visit (INDEPENDENT_AMBULATORY_CARE_PROVIDER_SITE_OTHER): Payer: Commercial Managed Care - PPO

## 2022-12-05 DIAGNOSIS — I5022 Chronic systolic (congestive) heart failure: Secondary | ICD-10-CM | POA: Diagnosis not present

## 2022-12-07 LAB — CUP PACEART REMOTE DEVICE CHECK
Battery Remaining Longevity: 55 mo
Battery Remaining Percentage: 54 %
Battery Voltage: 2.95 V
Brady Statistic RV Percent Paced: 1 %
Date Time Interrogation Session: 20240318180345
HighPow Impedance: 49 Ohm
HighPow Impedance: 49 Ohm
Implantable Lead Connection Status: 753985
Implantable Lead Implant Date: 20020111
Implantable Lead Location: 753860
Implantable Lead Model: 148
Implantable Lead Serial Number: 116740
Implantable Pulse Generator Implant Date: 20180503
Lead Channel Impedance Value: 660 Ohm
Lead Channel Pacing Threshold Amplitude: 1.25 V
Lead Channel Pacing Threshold Pulse Width: 0.5 ms
Lead Channel Sensing Intrinsic Amplitude: 12 mV
Lead Channel Setting Pacing Amplitude: 2.5 V
Lead Channel Setting Pacing Pulse Width: 0.5 ms
Lead Channel Setting Sensing Sensitivity: 0.5 mV
Pulse Gen Serial Number: 7421148

## 2022-12-15 ENCOUNTER — Ambulatory Visit: Payer: Commercial Managed Care - PPO | Attending: Cardiology | Admitting: *Deleted

## 2022-12-15 DIAGNOSIS — Z5181 Encounter for therapeutic drug level monitoring: Secondary | ICD-10-CM | POA: Diagnosis not present

## 2022-12-15 DIAGNOSIS — I480 Paroxysmal atrial fibrillation: Secondary | ICD-10-CM

## 2022-12-15 LAB — POCT INR: INR: 3.5 — AB (ref 2.0–3.0)

## 2022-12-15 NOTE — Patient Instructions (Signed)
Hold warfarin tonight then decrease dose to 1/2 tablet daily except 1 tablet on Tuesdays and Saturdays.  Recheck in 3 weeks. Continue greens Pt has decided to stay on warfarin for now.

## 2022-12-26 ENCOUNTER — Ambulatory Visit: Payer: Commercial Managed Care - PPO | Attending: Cardiovascular Disease

## 2022-12-26 DIAGNOSIS — Z95810 Presence of automatic (implantable) cardiac defibrillator: Secondary | ICD-10-CM | POA: Diagnosis not present

## 2022-12-26 DIAGNOSIS — I5022 Chronic systolic (congestive) heart failure: Secondary | ICD-10-CM | POA: Diagnosis not present

## 2022-12-28 NOTE — Progress Notes (Signed)
EPIC Encounter for ICM Monitoring  Patient Name: [name] is a 72 y.o. male Date: 12/28/2022 Primary Care Physican: Donetta Potts, MD Primary Cardiologist: Diona Browner Electrophysiologist: Mealor 08/10/2022 Weight: 218 lbs 10/21/2022 Weight: 218-220 lbs   Spoke with patient and heart failure questions reviewed.  Transmission results reviewed.  Pt asymptomatic for fluid accumulation.  Reports feeling well at this time and voices no complaints.      Corvue Thoracic impedance suggesting normal fluid levels.   Prescribed:  Spironolactone 25 mg take 0.5 tablet (12.5 mg total) daily. Jardiance 10 mg take 1 tablet by mouth before breakfast   Recommendations:  No changes and encouraged to call if experiencing any fluid symptoms.   Follow-up plan: ICM clinic phone appointment on 01/30/2023.   91 day device clinic remote transmission 03/06/2023.       EP/Cardiology Office visits:  Recall 07/26/2023. with Dr Nelly Laurence.   01/16/2023 with Dr Diona Browner.   Copy of ICM check sent to Dr. Nelly Laurence.   3 month ICM trend: 12/26/2022.    12-14 Month ICM trend:     Karie Soda, RN 12/28/2022 3:51 PM

## 2022-12-29 ENCOUNTER — Other Ambulatory Visit: Payer: Self-pay | Admitting: Cardiology

## 2023-01-05 ENCOUNTER — Ambulatory Visit: Payer: Commercial Managed Care - PPO | Attending: Cardiology | Admitting: *Deleted

## 2023-01-05 DIAGNOSIS — I480 Paroxysmal atrial fibrillation: Secondary | ICD-10-CM

## 2023-01-05 DIAGNOSIS — Z5181 Encounter for therapeutic drug level monitoring: Secondary | ICD-10-CM | POA: Diagnosis not present

## 2023-01-05 LAB — POCT INR: INR: 3 (ref 2.0–3.0)

## 2023-01-05 NOTE — Patient Instructions (Signed)
Continue warfarin 1/2 tablet daily except 1 tablet on Tuesdays and Saturdays.  Recheck in 4 weeks. Continue greens Pt has decided to stay on warfarin for now.

## 2023-01-15 NOTE — Progress Notes (Unsigned)
Cardiology Office Note  Date: 01/16/2023   ID: [name], DOB 21-Jun-1951, MRN [id number]  History of Present Illness: [name] is a 72 y.o. male last seen in October 2023.  He is here for a routine visit.  Reports no major change from a cardiac perspective, specifically no angina, stable NYHA class I-II dyspnea, no palpitations or syncope.  No leg swelling or major change in weight.  St. Jude ICD in place with followed by Dr. Nelly Laurence.  Recent device interrogation in March revealed normal function.  He does not report any device shocks.  He remains on Coumadin with follow-up in the anticoagulation clinic.  No spontaneous bleeding problems.  Recent INR 3.0.  He has had some trouble with left hip/buttock pain with walking.  Plans to see his PCP in the next few weeks, suggested that he ask for referral to see a [facility] based orthopedist.  He also plans to follow-up with the sleep medicine clinic, typically sees Dr. Tresa Endo.  Physical Exam: VS:  BP (!) 100/50   Pulse 73   Ht 6' (1.829 m)   Wt 229 lb (103.9 kg)   SpO2 96%   BMI 31.06 kg/m , BMI Body mass index is 31.06 kg/m.  Wt Readings from Last 3 Encounters:  01/16/23 229 lb (103.9 kg)  07/26/22 226 lb 6.4 oz (102.7 kg)  06/28/22 226 lb 3.2 oz (102.6 kg)    General: Patient appears comfortable at rest. HEENT: Conjunctiva and lids normal. Lungs: Clear to auscultation, nonlabored breathing at rest. Cardiac: Regular rate and rhythm, no S3 or significant systolic murmur. Extremities: No pitting edema.  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  Labwork:  May 2023: BUN 18, creatinine 1.09, potassium 4.7, AST 20, ALT 15, hemoglobin 16.3, platelets 140, cholesterol 141, triglycerides 101, HDL 53, LDL 69, TSH 2.16, hemoglobin A1c 6.2%  Other Studies Reviewed Today:  Echocardiogram 06/22/2022:  1. Left  ventricular ejection fraction, by estimation, is 30 to 35%. The  left ventricle has moderately decreased function. The left ventricle  demonstrates regional wall motion abnormalities (see scoring  diagram/findings for description). The left  ventricular internal cavity size was mildly dilated. Left ventricular  diastolic parameters are indeterminate. The average left ventricular  global longitudinal strain is -6.9 %. The global longitudinal strain is  abnormal.   2. Slow flow at LV apex without formed mural thormbus by Definity  contrast.   3. Right ventricular systolic function is normal. The right ventricular  size is normal. The estimated right ventricular systolic pressure is 21.7  mmHg.   4. Left atrial size was mildly dilated.   5. The mitral valve is abnormal. Mild mitral valve regurgitation.   6. The aortic valve is tricuspid. There is mild calcification of the  aortic valve. Aortic valve regurgitation is trivial. Aortic valve  sclerosis/calcification is present, without any evidence of aortic  stenosis.   7. The inferior vena cava is normal in size with greater than 50%  respiratory variability, suggesting right atrial pressure of 3 mmHg.   Assessment and Plan:  1.  CAD status post previous anterior wall infarct in 2002 with BMS to the LAD and OM1.  Follow-up cardiac catheterization in 2019 revealed patent stent sites.  He is doing well without active angina.  Remains on aspirin and Crestor.  As needed nitroglycerin available.  2.  HFrEF with ischemic cardiomyopathy, LVEF  30 to 35% by echocardiogram in October 2023.  No formed mural thrombus noted with Definity contrast.  Clinically stable with NYHA class I-II dyspnea and no significant fluid retention.  Continue bisoprolol, Entresto, Jardiance, and Aldactone.  He will have lab work with PCP in the next few weeks.  3.  Persistent atrial fibrillation with CHA2DS2-VASc score of 6.  He has preferred Coumadin to DOAC's and continues  with follow-up in the anticoagulation clinic.  He remains asymptomatic.  4.  St. 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 Nelly Laurence.  No device shocks or syncope.  5.  OSA on CPAP, he plans to schedule a follow-up in the sleep medicine clinic.  Sees Dr. Tresa Endo.  Disposition:  Follow up  6 months.  Signed, Jonelle Sidle, M.D., F.A.C.C. [facility]

## 2023-01-16 ENCOUNTER — Encounter: Payer: Self-pay | Admitting: Cardiology

## 2023-01-16 ENCOUNTER — Ambulatory Visit: Payer: Commercial Managed Care - PPO | Attending: Cardiology | Admitting: Cardiology

## 2023-01-16 VITALS — BP 100/50 | HR 73 | Ht 72.0 in | Wt 229.0 lb

## 2023-01-16 DIAGNOSIS — I5020 Unspecified systolic (congestive) heart failure: Secondary | ICD-10-CM

## 2023-01-16 DIAGNOSIS — I25119 Atherosclerotic heart disease of native coronary artery with unspecified angina pectoris: Secondary | ICD-10-CM | POA: Diagnosis not present

## 2023-01-16 DIAGNOSIS — Z95810 Presence of automatic (implantable) cardiac defibrillator: Secondary | ICD-10-CM

## 2023-01-16 DIAGNOSIS — I4819 Other persistent atrial fibrillation: Secondary | ICD-10-CM | POA: Diagnosis not present

## 2023-01-16 NOTE — Patient Instructions (Addendum)

## 2023-01-17 NOTE — Progress Notes (Signed)
Remote ICD transmission.   

## 2023-01-30 ENCOUNTER — Ambulatory Visit: Payer: Commercial Managed Care - PPO | Attending: Cardiovascular Disease

## 2023-01-30 DIAGNOSIS — I5022 Chronic systolic (congestive) heart failure: Secondary | ICD-10-CM | POA: Diagnosis not present

## 2023-01-30 DIAGNOSIS — Z95810 Presence of automatic (implantable) cardiac defibrillator: Secondary | ICD-10-CM

## 2023-02-01 NOTE — Progress Notes (Signed)
EPIC Encounter for ICM Monitoring  Patient Name: [name] is a 72 y.o. male Date: 02/01/2023 Primary Care Physican: Donetta Potts, MD Primary Cardiologist: Diona Browner Electrophysiologist: Mealor 08/10/2022 Weight: 218 lbs 10/21/2022 Weight: 218-220 lbs 02/01/2023 Weight: 218 lbs   Spoke with patient and heart failure questions reviewed.  Transmission results reviewed.  Pt asymptomatic for fluid accumulation.  Reports feeling well at this time and voices no complaints.      Corvue Thoracic impedance suggesting normal fluid levels.   Prescribed:  Spironolactone 25 mg take 0.5 tablet (12.5 mg total) daily. Jardiance 10 mg take 1 tablet by mouth before breakfast   Recommendations:  No changes and encouraged to call if experiencing any fluid symptoms.   Follow-up plan: ICM clinic phone appointment on 03/07/2023.   91 day device clinic remote transmission 03/06/2023.       EP/Cardiology Office visits:  Recall 07/26/2023. with Dr Nelly Laurence.   07/24/2023 with Dr Diona Browner.   Copy of ICM check sent to Dr. Nelly Laurence.   3 month ICM trend: 01/30/2023.    12-14 Month ICM trend:     Karie Soda, RN 02/01/2023 10:18 AM

## 2023-02-02 ENCOUNTER — Ambulatory Visit: Payer: Commercial Managed Care - PPO | Attending: Cardiology | Admitting: *Deleted

## 2023-02-02 DIAGNOSIS — I480 Paroxysmal atrial fibrillation: Secondary | ICD-10-CM | POA: Diagnosis not present

## 2023-02-02 DIAGNOSIS — Z5181 Encounter for therapeutic drug level monitoring: Secondary | ICD-10-CM

## 2023-02-02 LAB — POCT INR: POC INR: 3.1

## 2023-02-02 NOTE — Patient Instructions (Signed)
Description   Continue warfarin 1/2 tablet daily except 1 tablet on Tuesdays and Saturdays.  Recheck in 4 weeks. Eat a salad today.  Pt has decided to stay on warfarin for now.

## 2023-03-02 ENCOUNTER — Ambulatory Visit: Payer: Commercial Managed Care - PPO | Attending: Cardiology | Admitting: *Deleted

## 2023-03-02 DIAGNOSIS — I480 Paroxysmal atrial fibrillation: Secondary | ICD-10-CM | POA: Diagnosis not present

## 2023-03-02 DIAGNOSIS — Z5181 Encounter for therapeutic drug level monitoring: Secondary | ICD-10-CM

## 2023-03-02 LAB — POCT INR: INR: 2 (ref 2.0–3.0)

## 2023-03-02 NOTE — Patient Instructions (Signed)
Continue warfarin 1/2 tablet daily except 1 tablet on Tuesdays and Saturdays.  Recheck in 6 weeks.   Pt has decided to stay on warfarin for now.

## 2023-03-04 ENCOUNTER — Other Ambulatory Visit: Payer: Self-pay | Admitting: Cardiology

## 2023-03-06 ENCOUNTER — Ambulatory Visit (INDEPENDENT_AMBULATORY_CARE_PROVIDER_SITE_OTHER): Payer: Commercial Managed Care - PPO

## 2023-03-06 DIAGNOSIS — I255 Ischemic cardiomyopathy: Secondary | ICD-10-CM

## 2023-03-07 ENCOUNTER — Ambulatory Visit: Payer: Commercial Managed Care - PPO | Attending: Cardiovascular Disease

## 2023-03-07 DIAGNOSIS — I5022 Chronic systolic (congestive) heart failure: Secondary | ICD-10-CM | POA: Diagnosis not present

## 2023-03-07 DIAGNOSIS — Z95810 Presence of automatic (implantable) cardiac defibrillator: Secondary | ICD-10-CM

## 2023-03-07 LAB — CUP PACEART REMOTE DEVICE CHECK
Battery Remaining Longevity: 53 mo
Battery Remaining Percentage: 51 %
Battery Voltage: 2.95 V
Brady Statistic RV Percent Paced: 1 %
Date Time Interrogation Session: 20240617063606
HighPow Impedance: 48 Ohm
HighPow Impedance: 48 Ohm
Implantable Lead Connection Status: 753985
Implantable Lead Implant Date: 20020111
Implantable Lead Location: 753860
Implantable Lead Model: 148
Implantable Lead Serial Number: 116740
Implantable Pulse Generator Implant Date: 20180503
Lead Channel Impedance Value: 650 Ohm
Lead Channel Pacing Threshold Amplitude: 1.25 V
Lead Channel Pacing Threshold Pulse Width: 0.5 ms
Lead Channel Sensing Intrinsic Amplitude: 12 mV
Lead Channel Setting Pacing Amplitude: 2.5 V
Lead Channel Setting Pacing Pulse Width: 0.5 ms
Lead Channel Setting Sensing Sensitivity: 0.5 mV
Pulse Gen Serial Number: 7421148

## 2023-03-10 NOTE — Progress Notes (Signed)
EPIC Encounter for ICM Monitoring  Patient Name: [name] is a 72 y.o. male Date: 03/10/2023 Primary Care Physican: Donetta Potts, MD Primary Cardiologist: Diona Browner Electrophysiologist: Mealor 10/21/2022 Weight: 218-220 lbs 02/01/2023 Weight: 218 lbs 03/10/2023 Weight: 218 lbs   Spoke with patient and heart failure questions reviewed.  Transmission results reviewed.  Pt asymptomatic for fluid accumulation.  Reports feeling well at this time and voices no complaints.      Corvue Thoracic impedance suggesting normal fluid levels.   Prescribed:  Spironolactone 25 mg take 0.5 tablet (12.5 mg total) daily. Jardiance 10 mg take 1 tablet by mouth before breakfast   Recommendations:  No changes and encouraged to call if experiencing any fluid symptoms.   Follow-up plan: ICM clinic phone appointment on 04/10/2023.   91 day device clinic remote transmission 06/05/2023.       EP/Cardiology Office visits:  Recall 07/26/2023. with Dr Nelly Laurence.   07/24/2023 with Dr Diona Browner.   Copy of ICM check sent to Dr. Nelly Laurence.   3 month ICM trend: 03/09/2023.    12-14 Month ICM trend:     Karie Soda, RN 03/10/2023 4:27 PM

## 2023-03-27 NOTE — Progress Notes (Signed)
Remote ICD transmission.   

## 2023-04-02 ENCOUNTER — Other Ambulatory Visit: Payer: Self-pay | Admitting: Cardiology

## 2023-04-03 ENCOUNTER — Encounter: Payer: Self-pay | Admitting: *Deleted

## 2023-04-10 ENCOUNTER — Ambulatory Visit: Payer: Commercial Managed Care - PPO

## 2023-04-10 DIAGNOSIS — Z95810 Presence of automatic (implantable) cardiac defibrillator: Secondary | ICD-10-CM | POA: Diagnosis not present

## 2023-04-10 DIAGNOSIS — I5022 Chronic systolic (congestive) heart failure: Secondary | ICD-10-CM

## 2023-04-11 ENCOUNTER — Telehealth: Payer: Self-pay

## 2023-04-11 NOTE — Telephone Encounter (Signed)
Remote ICM transmission received.  Attempted call to patient regarding ICM remote transmission and left detailed message per DPR.  Left ICM phone number and advised to return call for any fluid symptoms or questions. Next ICM remote transmission scheduled 05/15/2023.

## 2023-04-11 NOTE — Progress Notes (Signed)
EPIC Encounter for ICM Monitoring  Patient Name: [name] is a 72 y.o. male Date: 04/11/2023 Primary Care Physican: Donetta Potts, MD Primary Cardiologist: Diona Browner Electrophysiologist: Mealor 10/21/2022 Weight: 218-220 lbs 02/01/2023 Weight: 218 lbs 03/10/2023 Weight: 218 lbs   Attempted call to patient and unable to reach.  Left detailed message per DPR regarding transmission. Transmission reviewed.    Corvue Thoracic impedance suggesting normal fluid levels with the exception of possible fluid accumulation from 7/13-7/19.   Prescribed:  Spironolactone 25 mg take 0.5 tablet (12.5 mg total) daily. Jardiance 10 mg take 1 tablet by mouth before breakfast   Recommendations:  Left voice mail with ICM number and encouraged to call if experiencing any fluid symptoms.   Follow-up plan: ICM clinic phone appointment on 05/15/2023.   91 day device clinic remote transmission 06/05/2023.       EP/Cardiology Office visits:  Recall 07/26/2023. with Dr Nelly Laurence.   07/24/2023 with Dr Diona Browner.   Copy of ICM check sent to Dr. Nelly Laurence.   3 month ICM trend: 04/10/2023.    12-14 Month ICM trend:     Karie Soda, RN 04/11/2023 1:49 PM

## 2023-04-17 ENCOUNTER — Ambulatory Visit: Payer: Commercial Managed Care - PPO | Attending: Cardiology | Admitting: *Deleted

## 2023-04-17 DIAGNOSIS — Z5181 Encounter for therapeutic drug level monitoring: Secondary | ICD-10-CM | POA: Diagnosis not present

## 2023-04-17 DIAGNOSIS — I480 Paroxysmal atrial fibrillation: Secondary | ICD-10-CM

## 2023-04-17 LAB — POCT INR: INR: 2.8 (ref 2.0–3.0)

## 2023-04-17 NOTE — Patient Instructions (Signed)
Continue warfarin 1/2 tablet daily except 1 tablet on Tuesdays and Saturdays.  Recheck in 6 weeks.   Pt has decided to stay on warfarin for now.

## 2023-05-15 ENCOUNTER — Ambulatory Visit: Payer: Commercial Managed Care - PPO | Attending: Cardiovascular Disease

## 2023-05-15 DIAGNOSIS — I5022 Chronic systolic (congestive) heart failure: Secondary | ICD-10-CM

## 2023-05-15 DIAGNOSIS — Z95810 Presence of automatic (implantable) cardiac defibrillator: Secondary | ICD-10-CM

## 2023-05-18 NOTE — Progress Notes (Signed)
EPIC Encounter for ICM Monitoring  Patient Name: [name] is a 72 y.o. male Date: 05/18/2023 Primary Care Physican: Donetta Potts, MD Primary Cardiologist: Diona Browner Electrophysiologist: Mealor 10/21/2022 Weight: 218-220 lbs 02/01/2023 Weight: 218 lbs 03/10/2023 Weight: 218 lbs 05/18/2023 Weight: 218 lbs   Spoke with patient and heart failure questions reviewed.  Transmission results reviewed.  Pt asymptomatic for fluid accumulation.  Reports feeling well at this time and voices no complaints.     Corvue Thoracic impedance suggesting normal fluid levels within the last month.   Prescribed:  Spironolactone 25 mg take 0.5 tablet (12.5 mg total) daily. Jardiance 10 mg take 1 tablet by mouth before breakfast   Recommendations: No changes and encouraged to call if experiencing any fluid symptoms.   Follow-up plan: ICM clinic phone appointment on 06/26/2023.   91 day device clinic remote transmission 06/05/2023.       EP/Cardiology Office visits:  Recall 07/26/2023. with Dr Nelly Laurence.   07/24/2023 with Dr Diona Browner.   Copy of ICM check sent to Dr. Nelly Laurence.   3 month ICM trend: 05/15/2023.    12-14 Month ICM trend:     Karie Soda, RN 05/18/2023 2:50 PM

## 2023-05-29 ENCOUNTER — Ambulatory Visit: Payer: Commercial Managed Care - PPO | Attending: Cardiology | Admitting: *Deleted

## 2023-05-29 DIAGNOSIS — Z5181 Encounter for therapeutic drug level monitoring: Secondary | ICD-10-CM | POA: Diagnosis not present

## 2023-05-29 DIAGNOSIS — I480 Paroxysmal atrial fibrillation: Secondary | ICD-10-CM

## 2023-05-29 LAB — POCT INR: INR: 2 (ref 2.0–3.0)

## 2023-05-29 NOTE — Patient Instructions (Signed)
Continue warfarin 1/2 tablet daily except 1 tablet on Tuesdays and Saturdays.  Recheck in 6 weeks.   Pt has decided to stay on warfarin for now.

## 2023-06-05 ENCOUNTER — Ambulatory Visit (INDEPENDENT_AMBULATORY_CARE_PROVIDER_SITE_OTHER): Payer: Commercial Managed Care - PPO

## 2023-06-05 DIAGNOSIS — I495 Sick sinus syndrome: Secondary | ICD-10-CM

## 2023-06-05 DIAGNOSIS — I255 Ischemic cardiomyopathy: Secondary | ICD-10-CM | POA: Diagnosis not present

## 2023-06-06 LAB — CUP PACEART REMOTE DEVICE CHECK
Battery Remaining Longevity: 52 mo
Battery Remaining Percentage: 50 %
Battery Voltage: 2.95 V
Brady Statistic RV Percent Paced: 1 %
Date Time Interrogation Session: 20240916210741
HighPow Impedance: 52 Ohm
HighPow Impedance: 52 Ohm
Implantable Lead Connection Status: 753985
Implantable Lead Implant Date: 20020111
Implantable Lead Location: 753860
Implantable Lead Model: 148
Implantable Lead Serial Number: 116740
Implantable Pulse Generator Implant Date: 20180503
Lead Channel Impedance Value: 690 Ohm
Lead Channel Pacing Threshold Amplitude: 1.25 V
Lead Channel Pacing Threshold Pulse Width: 0.5 ms
Lead Channel Sensing Intrinsic Amplitude: 12 mV
Lead Channel Setting Pacing Amplitude: 2.5 V
Lead Channel Setting Pacing Pulse Width: 0.5 ms
Lead Channel Setting Sensing Sensitivity: 0.5 mV
Pulse Gen Serial Number: 7421148

## 2023-06-11 ENCOUNTER — Other Ambulatory Visit: Payer: Self-pay | Admitting: Cardiology

## 2023-06-13 ENCOUNTER — Encounter: Payer: Self-pay | Admitting: Gastroenterology

## 2023-06-13 ENCOUNTER — Ambulatory Visit: Payer: Commercial Managed Care - PPO | Admitting: Gastroenterology

## 2023-06-13 VITALS — BP 108/73 | HR 83 | Temp 97.9°F | Ht 72.0 in | Wt 228.6 lb

## 2023-06-13 DIAGNOSIS — K529 Noninfective gastroenteritis and colitis, unspecified: Secondary | ICD-10-CM | POA: Diagnosis not present

## 2023-06-13 DIAGNOSIS — R197 Diarrhea, unspecified: Secondary | ICD-10-CM

## 2023-06-13 MED ORDER — DICYCLOMINE HCL 10 MG PO CAPS: 10.0000 mg | ORAL_CAPSULE | Freq: Three times a day (TID) | ORAL | 1 refills | Status: DC

## 2023-06-13 NOTE — Progress Notes (Signed)
Gastroenterology Office Note     Primary Care Physician:  Donetta Potts, MD  Primary Gastroenterologist: Dr. Jena Gauss    Chief Complaint   Chief Complaint  Patient presents with   Follow-up    Ov before colonoscopy and follow up     History of Present Illness   [name] is a 72 y.o. male presenting today with a history of postprandial formed stool with urgency, recommended to take fiber, with  colonoscopy completed in Aug 2021 with scattered medium-mouthed diverticula in sigmoid, three sessile polyps in hepatic flexure and cecum. 5-7 mm in size. Tubular adenomas. Benign colonic mucosa. 3 year surveillance due now.  In regards to bowel habits, he had no significant improvement with Xifaxan. Less severe on pancreatic enzymes but still present. Dicyclomine in the past but has no more left currently. Celiac serologies negative in 2022. TSH normal last year. He has had no weight loss.   In morning will have more frequent urgency postprandially. When getting up and moving around will have a BM, then if eating decent size breakfast will be more water. Stool is not oily or greasy. He limits food intake if going out places as he knows he will have urgency for a few episodes then resolve. No overt GI bleeding.    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 s/p BMS to LAD and OM1 b. 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HCC)    Ischemic cardiomyopathy    LVEF 30%, status post ICD   OSA (obstructive sleep apnea)    unable to tolerate CPAP   Paroxysmal atrial fibrillation (HCC)    On coumadin   Pseudoaneurysm (HCC)    SCC  (squamous cell carcinoma) 08/04/2006   right shoulder (CX35FU)   SCC (squamous cell carcinoma) well differentiated 12/13/2011   right temple   Squamous cell carcinoma in situ (SCCIS) 08/04/2006   right sholder   Squamous cell carcinoma of skin 11/09/2020   in situ- left upper back   Squamous cell carcinoma of skin 11/09/2020   KA-right dorsal hand   Stroke [facility])     Past Surgical History:  Procedure Laterality Date   AICD implantation  2011   St Jude ICD implanted for primary prevention of sudden death   BIOPSY  05-21-20   Procedure: BIOPSY;  Surgeon: Corbin Ade, MD;  Location: [facility] ENDO SUITE;  Service: Endoscopy;;   CATARACT EXTRACTION W/PHACO Left 03/27/2017   Procedure: CATARACT EXTRACTION PHACO AND INTRAOCULAR LENS PLACEMENT (IOC);  Surgeon: Gemma Payor, MD;  Location: [facility] ORS;  Service: Ophthalmology;  Laterality: Left;  CDE: 8.48   CATARACT EXTRACTION W/PHACO Right 05/29/2017   Procedure: CATARACT EXTRACTION PHACO AND INTRAOCULAR LENS PLACEMENT (IOC);  Surgeon: Gemma Payor, MD;  Location: [facility] ORS;  Service: Ophthalmology;  Laterality: Right;  CDE: 7.48   COLONOSCOPY N/A 05-21-20   Scattered medium-mouthed diverticula in sigmoid, three sessile polyps in hepatic flexure and cecum. 5-7 mm in size. Tubular adenomas. Benign colonic mucosa. 3 year surveillance.    EYE SURGERY     ICD GENERATOR CHANGEOUT N/A 01/19/2017   SJM [place]se  VR ICD generator change by Dr Johney Frame   LAPAROSCOPY N/A 10/23/2017   Procedure: LAPAROSCOPY DIAGNOSTIC;  Surgeon: Emelia Loron, MD;  Location: [facility] OR;  Service: General;  Laterality: N/A;   LEFT HEART CATH AND CORONARY ANGIOGRAPHY N/A 06/21/2018   Procedure: LEFT HEART CATH AND CORONARY ANGIOGRAPHY;  Surgeon: Marykay Lex, MD;  Location: [facility] INVASIVE CV LAB;  Service: Cardiovascular;  Laterality: N/A;   POLYPECTOMY  04/21/2020   Procedure: POLYPECTOMY;  Surgeon: Corbin Ade, MD;  [facility];  Service: Endoscopy;;   VENTRAL HERNIA REPAIR  N/A 10/23/2017   Procedure: VENTRAL HERNIA REPAIR;  Surgeon: Emelia Loron, MD;  Location: [facility] OR;  Service: General;  Laterality: N/A;    Current Outpatient Medications  Medication Sig Dispense Refill   aspirin 81 MG tablet Take 81 mg by mouth daily.     bisoprolol (ZEBETA) 5 MG tablet TAKE 1 TABLET (5 MG TOTAL) BY MOUTH DAILY. 90 tablet 2   ENTRESTO 24-26 MG TAKE 1 TABLET BY MOUTH TWICE A DAY 180 tablet 3   finasteride (PROSCAR) 5 MG tablet Take 5 mg by mouth daily.     JARDIANCE 10 MG TABS tablet TAKE 1 TABLET BY MOUTH EVERY DAY BEFORE BREAKFAST 30 tablet 11   spironolactone (ALDACTONE) 25 MG tablet Take 0.5 tablets (12.5 mg total) by mouth daily. 45 tablet 1   warfarin (COUMADIN) 5 MG tablet TAKE 1/2 TABLET DAILY EXCEPT 1 TABLET ON TUESDAYS, THURSDAYS AND SATURDAYS OR AS DIRECTED 90 tablet 3   dicyclomine (BENTYL) 10 MG capsule Take 1 capsule (10 mg total) by mouth 4 (four) times daily -  before meals and at bedtime. For frequent stools. Start off with just twice a day. (Patient not taking: Reported on 06/13/2023) 90 capsule 0   lipase/protease/amylase (CREON) 36000 UNITS CPEP capsule Take 2 capsules (72,000 Units total) by mouth 3 (three) times daily with meals. May also take 1 capsule (36,000 Units total) as needed (with snacks). (Patient not taking: Reported on 06/13/2023) 240 capsule 11   nitroGLYCERIN (NITROSTAT) 0.4 MG SL tablet Place 1 tablet (0.4 mg total) under the tongue every 5 (five) minutes as needed for chest pain. (Patient not taking: Reported on 06/13/2023) 25 tablet 3   rosuvastatin (CRESTOR) 20 MG tablet TAKE 1 TABLET BY MOUTH EVERY DAY (Patient not taking: Reported on 06/13/2023) 90 tablet 3   No current facility-administered medications for this visit.    Allergies as of 06/13/2023   (No Known Allergies)    Family History  Problem Relation Age of Onset   Cancer Mother        lymphoma    Heart attack Father    Heart disease Brother    Colon cancer Neg Hx    Colon  polyps Neg Hx     Social History   Socioeconomic History   Marital status: Married    Spouse name: Not on file   Number of children: Not on file   Years of education: Not on file   Highest education level: Not on file  Occupational History   Occupation: retired    Comment: Banking  Tobacco Use   Smoking status: Never    Passive exposure: Never   Smokeless tobacco: Never  Vaping Use   Vaping status: Never Used  Substance and Sexual Activity   Alcohol use: Yes    Comment: occasional "moderate"   Drug use: No   Sexual activity: Yes  Other Topics Concern   Not on file  Social  History Narrative   Not on file   Social Determinants of Health   Financial Resource Strain: Not on file  Food Insecurity: Not on file  Transportation Needs: Not on file  Physical Activity: Not on file  Stress: Not on file  Social Connections: Not on file  Intimate Partner Violence: Not on file     Review of Systems   Gen: Denies any fever, chills, fatigue, weight loss, lack of appetite.  CV: Denies chest pain, heart palpitations, peripheral edema, syncope.  Resp: Denies shortness of breath at rest or with exertion. Denies wheezing or cough.  GI: Denies dysphagia or odynophagia. Denies jaundice, hematemesis, fecal incontinence. GU : Denies urinary burning, urinary frequency, urinary hesitancy MS: Denies joint pain, muscle weakness, cramps, or limitation of movement.  Derm: Denies rash, itching, dry skin Psych: Denies depression, anxiety, memory loss, and confusion Heme: Denies bruising, bleeding, and enlarged lymph nodes.   Physical Exam   BP 108/73   Pulse 83   Temp 97.9 F (36.6 C)   Ht 6' (1.829 m)   Wt 228 lb 9.6 oz (103.7 kg)   BMI 31.00 kg/m  General:   Alert and oriented. Pleasant and cooperative. Well-nourished and well-developed.  Head:  Normocephalic and atraumatic. Eyes:  Without icterus Abdomen:  +BS, soft, non-tender and non-distended. No HSM noted. No guarding or  rebound. No masses appreciated.  Rectal:  Deferred  Msk:  Symmetrical without gross deformities. Normal posture. Extremities:  Without edema. Neurologic:  Alert and  oriented x4;  grossly normal neurologically. Skin:  Intact without significant lesions or rashes. Psych:  Alert and cooperative. Normal mood and affect.   Assessment   [name] is a  delightful 72 y.o. male presenting today with a history of postprandial formed stool with urgency for many years, tubular adenomas in 2021 with 3 year surveillance due now, on Coumadin with history of afib, stroke in past, and has ICD.    As noted in HPI, he has had negative celiac serologies, normal TSH, normal colonic biopsies, no improvement with Xifaxan and only modest with pancreatic enzymes. He does recall dicyclomine has been helpful in the past and would like to trial again. No weight loss or abdominal pain. We have not performed CT, which we had discussed in the past. He desires to do this.   We will also reach out to the Coumadin clinic, as they graciously assisted in lovenox bridging for last colonoscopy in 2021.   PLAN    Resume dicyclomine up to TID; start with BID at first. Side effects discussed Hold Coumadin X 4 days prior and will need Lovenox bridge: reaching out to Coumadin clinic to assist as they have done this historically and much appreciated! CT in near future Proceed with colonoscopy by Dr. Jena Gauss in near future: the risks, benefits, and alternatives have been discussed with the patient in detail. The patient states understanding and desires to proceed. Has ICD.     Gelene Mink, PhD, ANP-BC [facility]

## 2023-06-13 NOTE — Patient Instructions (Signed)
We are arranging a CT scan in the near future! Please have blood work done when you are able.   You will stop Coumadin 4 days prior and do a lovenox bridging. We will ask the Coumadin clinic to help with this.  For colonoscopy, you will need to stop Jardiance 72 hours prior.   You can take dicyclomine up to 3 times a day before meals for frequent stools. Start out just once per day. This can cause dry mouth, constipation, confusion as side effects. Take smallest dose that helps your symptoms.   Further recommendations to follow!  I enjoyed seeing you again today! I value our relationship and want to provide genuine, compassionate, and quality care. You may receive a survey regarding your visit with me, and I welcome your feedback! Thanks so much for taking the time to complete this. I look forward to seeing you again.      Gelene Mink, PhD, ANP-BC [facility]

## 2023-06-14 ENCOUNTER — Encounter: Payer: Self-pay | Admitting: *Deleted

## 2023-06-14 ENCOUNTER — Telehealth: Payer: Self-pay | Admitting: *Deleted

## 2023-06-14 ENCOUNTER — Ambulatory Visit: Payer: Commercial Managed Care - PPO | Admitting: Orthopaedic Surgery

## 2023-06-14 ENCOUNTER — Other Ambulatory Visit (INDEPENDENT_AMBULATORY_CARE_PROVIDER_SITE_OTHER): Payer: Commercial Managed Care - PPO

## 2023-06-14 ENCOUNTER — Encounter: Payer: Self-pay | Admitting: Orthopaedic Surgery

## 2023-06-14 ENCOUNTER — Other Ambulatory Visit: Payer: Self-pay | Admitting: *Deleted

## 2023-06-14 DIAGNOSIS — M79605 Pain in left leg: Secondary | ICD-10-CM

## 2023-06-14 DIAGNOSIS — M1612 Unilateral primary osteoarthritis, left hip: Secondary | ICD-10-CM | POA: Insufficient documentation

## 2023-06-14 MED ORDER — NA SULFATE-K SULFATE-MG SULF 17.5-3.13-1.6 GM/177ML PO SOLN: ORAL | 0 refills | Status: DC

## 2023-06-14 NOTE — Telephone Encounter (Signed)
[name], is he needing clearance?

## 2023-06-14 NOTE — Telephone Encounter (Signed)
I don't need clearance unless that is their protocol when holding Coumadin. In the past, the pharmacist helped to do this so it was done correctly.

## 2023-06-14 NOTE — Telephone Encounter (Signed)
Patient scheduled for Colonoscopy 08/09/23. He will need to hold his coumadin x 4 days prior. Dewayne Hatch would like to know if you can help with lovenox bridge is he needs it. Thanks!

## 2023-06-14 NOTE — Progress Notes (Signed)
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  He comes in with left hip pain but also low back pain.  His pain in his hip is definitely in the groin area.  It starts in his back as well.  It does not radiate into his foot.  He walks with a limp.  He is a retired Psychologist, occupational.  He is on Coumadin having had a history of a heart attack and a stroke.  He sees cardiologist regularly.  In November he does have a colonoscopy coming up.  He has to stop Coumadin he said for 4 days and be bridged with Lovenox.  This would likely be something similar for having hip replacement if he decides to go that route.  At this point his left hip pain is starting to detrimentally affect his mobility, his quality of life and his activities of daily living.  I was able to review all of his medications and past medical history within epic.  On exam his left hip has significant limitations with internal and external rotation.  The hip is very stiff and has a lot of pain with rotation.  The right hip moves more smoothly.  He has good strength in his bilateral lower extremities and normal sensation today.  He has a negative straight leg raise as well.  He does have some pain with flexion and extension of the lumbar spine.  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  It is bone-on-bone wear with complete loss of joint space and flattening of the femoral head as well as sclerotic changes.  There is some slight narrowing of the right hip joint which is moderate arthritis.  2 views of the lumbar spine show a grade 1 spondylolisthesis between L4 and L5 with otherwise no acute findings.  We had a long and thorough discussion about hip replacement surgery.  He would like to come back to [place] in late November or early December to discuss this further after he gets to his colonoscopy.  He is welcome to call us anytime before then and talk about this further.  I  did give him a handout about hip replacement surgery and talked to him about this in detail and we can discuss this again at his next visit.  All questions concerns were addressed and answered.

## 2023-06-15 ENCOUNTER — Encounter: Payer: Self-pay | Admitting: *Deleted

## 2023-06-15 ENCOUNTER — Other Ambulatory Visit: Payer: Self-pay | Admitting: *Deleted

## 2023-06-15 DIAGNOSIS — Z01818 Encounter for other preprocedural examination: Secondary | ICD-10-CM

## 2023-06-15 LAB — SEDIMENTATION RATE: Sed Rate: 11 mm/hr (ref 0–30)

## 2023-06-15 LAB — C-REACTIVE PROTEIN: CRP: <1 mg/L (ref 0–10)

## 2023-06-15 NOTE — Telephone Encounter (Signed)
Spoke with patient aware of labwork BMP will go to [facility] for labs. Patient was also told he has appointment with Dr. Diona Browner 11-4 prior to 11-20 procedure and will let you know if that will be clearance appointment.

## 2023-06-15 NOTE — Telephone Encounter (Signed)
Preoperative team, please contact patient and let him know that he will need to have a BMP prior to receiving recommendations for holding anticoagulation.  Please also include details for requesting office and fax number.  Please order BMP after contact patient.  Thank you for your help.  Thomasene Ripple. Britiney Blahnik NP-C     06/15/2023, 9:43 AM [facility] [address] Office [phone] Fax [phone]

## 2023-06-15 NOTE — Telephone Encounter (Signed)
Patient with diagnosis of A fib on warfarin for anticoagulation.    Patient needs updated BMP  Procedure: colonoscopy Date of procedure: 08/09/23   CHA2DS2-VASc Score = 6  This indicates a 9.7% annual risk of stroke. The patient's score is based upon: CHF History: 1 HTN History: 1 Diabetes History: 0 Stroke History: 2 Vascular Disease History: 1 Age Score: 1 Gender Score: 0    CrCl Do not see recent BMP on file Platelet count 150K  Patient will need updated lab work before clearance can be given  **This guidance is not considered finalized until pre-operative APP has relayed final recommendations.**

## 2023-06-15 NOTE — Telephone Encounter (Signed)
Spoke with patient aware of labwork BMP will go to [facility] for labs. Patient was also told he has appointment with Dr. Diona Browner 11-4 prior to 11-20 procedure and will let you know.

## 2023-06-16 NOTE — Telephone Encounter (Signed)
Pt has appt with Dr. Diona Browner 07/24/23. Will update all parties involved. Will add need pre op clearance to appt notes.

## 2023-06-21 ENCOUNTER — Ambulatory Visit (HOSPITAL_COMMUNITY)
Admission: RE | Admit: 2023-06-21 | Discharge: 2023-06-21 | Disposition: A | Discharge status: Home / Self Care | Payer: Commercial Managed Care - PPO | Source: Ambulatory Visit | Attending: Gastroenterology | Admitting: Gastroenterology

## 2023-06-21 DIAGNOSIS — R197 Diarrhea, unspecified: Secondary | ICD-10-CM | POA: Diagnosis present

## 2023-06-21 MED ORDER — IOHEXOL 300 MG/ML  SOLN
100.0000 mL | Freq: Once | INTRAMUSCULAR | Status: AC | PRN
  Administered 2023-06-21: 100 mL via INTRAVENOUS

## 2023-06-21 NOTE — Progress Notes (Signed)
Remote ICD transmission.   

## 2023-06-26 ENCOUNTER — Ambulatory Visit: Payer: Commercial Managed Care - PPO

## 2023-06-26 DIAGNOSIS — I5022 Chronic systolic (congestive) heart failure: Secondary | ICD-10-CM | POA: Diagnosis not present

## 2023-06-26 DIAGNOSIS — Z95810 Presence of automatic (implantable) cardiac defibrillator: Secondary | ICD-10-CM

## 2023-06-30 NOTE — Progress Notes (Signed)
EPIC Encounter for ICM Monitoring  Patient Name: [name] is a 72 y.o. male Date: 06/30/2023 Primary Care Physican: Donetta Potts, MD Primary Cardiologist: Diona Browner Electrophysiologist: Mealor 10/21/2022 Weight: 218-220 lbs 02/01/2023 Weight: 218 lbs 03/10/2023 Weight: 218 lbs 05/18/2023 Weight: 218 lbs 06/30/2023 Weight: 221 lbs    Spoke with patient and heart failure questions reviewed.  Transmission results reviewed.  Pt asymptomatic for fluid accumulation.  Reports feeling well at this time and voices no complaints.     [place]vue Thoracic impedance suggesting normal fluid levels with the exception of possible fluid accumulation from 9/20-9/26.   Prescribed:  Spironolactone 25 mg take 0.5 tablet (12.5 mg total) daily. Jardiance 10 mg take 1 tablet by mouth before breakfast   Recommendations:   No changes and encouraged to call if experiencing any fluid symptoms.   Follow-up plan: ICM clinic phone appointment on 07/31/2023.   91 day device clinic remote transmission 09/04/2023.       EP/Cardiology Office visits:  07/28/2023. with Dr Nelly Laurence.   07/24/2023 with Dr Diona Browner.   Copy of ICM check sent to Dr. Nelly Laurence.   3 month ICM trend: 06/26/2023.    12-14 Month ICM trend:     Karie Soda, RN 06/30/2023 3:22 PM

## 2023-07-01 ENCOUNTER — Other Ambulatory Visit: Payer: Self-pay | Admitting: Cardiology

## 2023-07-05 ENCOUNTER — Ambulatory Visit: Payer: Commercial Managed Care - PPO | Attending: Cardiology | Admitting: Cardiology

## 2023-07-05 ENCOUNTER — Encounter: Payer: Self-pay | Admitting: Cardiology

## 2023-07-05 VITALS — BP 118/70 | HR 80 | Ht 73.0 in | Wt 229.2 lb

## 2023-07-05 DIAGNOSIS — Z01810 Encounter for preprocedural cardiovascular examination: Secondary | ICD-10-CM

## 2023-07-05 DIAGNOSIS — I5020 Unspecified systolic (congestive) heart failure: Secondary | ICD-10-CM | POA: Diagnosis not present

## 2023-07-05 DIAGNOSIS — I25119 Atherosclerotic heart disease of native coronary artery with unspecified angina pectoris: Secondary | ICD-10-CM

## 2023-07-05 DIAGNOSIS — I4819 Other persistent atrial fibrillation: Secondary | ICD-10-CM

## 2023-07-05 DIAGNOSIS — E782 Mixed hyperlipidemia: Secondary | ICD-10-CM

## 2023-07-05 DIAGNOSIS — I5022 Chronic systolic (congestive) heart failure: Secondary | ICD-10-CM

## 2023-07-05 DIAGNOSIS — Z95810 Presence of automatic (implantable) cardiac defibrillator: Secondary | ICD-10-CM | POA: Diagnosis not present

## 2023-07-05 MED ORDER — ROSUVASTATIN CALCIUM 20 MG PO TABS: 20.0000 mg | ORAL_TABLET | Freq: Every day | ORAL | 2 refills | Status: DC

## 2023-07-05 NOTE — Progress Notes (Signed)
Cardiology Office Note  Date: 07/05/2023   ID: [name], DOB 07-Jul-1951, MRN [id number]  History of Present Illness: [name] is a 72 y.o. male last seen in April.  He is here for a routine visit.  Reports stable NYHA class I-II dyspnea, no fluid retention, no angina.  He has had pain in his left hip and was seen recently by Dr. Magnus Ivan, considering left hip replacement via anterior approach later in the year.  He is scheduled to undergo colonoscopy in November.  Plan is to come off Coumadin with Lovenox bridge which will be coordinated through our anticoagulation clinic.  INR was 2.0 in September.  BMET is scheduled and pending.  I reviewed his medications.  Current cardiac regimen includes aspirin, Coumadin, bisoprolol, Entresto, Jardiance, Aldactone, Crestor, and as needed nitroglycerin.  St. Jude ICD in place with follow-up with Dr. Nelly Laurence.  Device interrogation in September revealed normal function.  Recent thoracic impedance indicated normal fluid status recently.  He does not report any device shocks or syncope.  ECG today shows rate controlled atrial fibrillation with left anterior fascicular block, old anterior infarct pattern, and low voltage.  RCRI perioperative cardiac risk is class III, approximately 10% chance of major adverse cardiac event.  Physical Exam: VS:  BP 118/70 (BP Location: Left Arm)   Pulse 80   Ht 6\' 1"  (1.854 m)   Wt 229 lb 3.2 oz (104 kg)   SpO2 96%   BMI 30.24 kg/m , BMI Body mass index is 30.24 kg/m.  Wt Readings from Last 3 Encounters:  07/05/23 229 lb 3.2 oz (104 kg)  06/13/23 228 lb 9.6 oz (103.7 kg)  01/16/23 229 lb (103.9 kg)    General: Patient appears comfortable at rest. HEENT: Conjunctiva and lids normal. Neck: Supple, no elevated JVP or carotid bruits. Lungs: Clear to auscultation, nonlabored breathing at rest. Cardiac: Regular rate and rhythm, no S3 or significant systolic murmur. Extremities: No pitting  edema.  ECG:  An ECG dated 05/18/2022 was personally reviewed today and demonstrated:  Rate controlled atrial fibrillation with left anterior fascicular block, old anterior infarct pattern, low voltage.  Labwork:  May 2023: BUN 18, creatinine 1.09, potassium 4.7, AST 20, ALT 15, hemoglobin 16.3, platelets 140, cholesterol 141, triglycerides 101, HDL 53, LDL 69, TSH 2.16, hemoglobin A1c 6.2%  September 2024: ESR 11, CRP less than 1  Other Studies Reviewed Today:  Echocardiogram 06/22/2022:  1. Left ventricular ejection fraction, by estimation, is 30 to 35%. The  left ventricle has moderately decreased function. The left ventricle  demonstrates regional wall motion abnormalities (see scoring  diagram/findings for description). The left  ventricular internal cavity size was mildly dilated. Left ventricular  diastolic parameters are indeterminate. The average left ventricular  global longitudinal strain is -6.9 %. The global longitudinal strain is  abnormal.   2. Slow flow at LV apex without formed mural thormbus by Definity  contrast.   3. Right ventricular systolic function is normal. The right ventricular  size is normal. The estimated right ventricular systolic pressure is 21.7  mmHg.   4. Left atrial size was mildly dilated.   5. The mitral valve is abnormal. Mild mitral valve regurgitation.   6. The aortic valve is tricuspid. There is mild calcification of the  aortic valve. Aortic valve regurgitation is trivial. Aortic valve  sclerosis/calcification is present, without any evidence of aortic  stenosis.   7. The inferior vena cava is normal in size with greater than 50%  respiratory variability, suggesting right atrial pressure of 3 mmHg.   Assessment and Plan:  1.  CAD status post previous anterior wall infarct in 2002 with BMS to the LAD and OM1.  Follow-up cardiac catheterization in 2019 revealed patent stent sites.  He does not report any angina at this time, no nitroglycerin  use.  Continue aspirin and Crestor.  2.  Patient pending colonoscopy in November.  Plan to transition off Coumadin with Lovenox bridge through the anticoagulation clinic.  RCRI perioperative cardiac risk index is class III, approximately 10% chance of major adverse cardiac event.  He is clinically stable and should be able to proceed with endoscopy.  Same risk applies to potential left hip replacement later in the year, although at this point not sure about type of anesthesia.   3.  HFrEF with ischemic cardiomyopathy, LVEF 30 to 35% by echocardiogram in October 2023.  No formed mural thrombus noted with Definity contrast.  He is clinically stable with NYHA class I-II dyspnea and no fluid retention.  Continue bisoprolol, Entresto, Jardiance, and Aldactone.  BMET pending.   4.  Persistent atrial fibrillation with CHA2DS2-VASc score of 6.  He has preferred Coumadin to DOAC's and continues with follow-up in the anticoagulation clinic.  5.  Mixed hyperlipidemia.  Continue Crestor, last LDL 69 in May.   6.  St. 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 Nelly Laurence.  No device shocks or syncope.   7.  OSA with difficulty tolerating CPAP.  Keep follow-up in the sleep medicine clinic.  Disposition:  Follow up  6 months.  Signed, Jonelle Sidle, M.D., F.A.C.C. [facility]

## 2023-07-05 NOTE — Patient Instructions (Addendum)
Medication Instructions:  Your physician recommends that you continue on your current medications as directed. Please refer to the Current Medication list given to you today.  Labwork: BMET today at [facility] ([address] [place]. [place]) Non-fasting  Testing/Procedures: none  Follow-Up: Your physician recommends that you schedule a follow-up appointment in: 6 months  Any Other Special Instructions Will Be Listed Below (If Applicable).  If you need a refill on your cardiac medications before your next appointment, please call your pharmacy.

## 2023-07-07 ENCOUNTER — Other Ambulatory Visit: Payer: Self-pay | Admitting: Gastroenterology

## 2023-07-07 LAB — BASIC METABOLIC PANEL
BUN/Creatinine Ratio: 13 (ref 10–24)
BUN: 16 mg/dL (ref 8–27)
CO2: 24 mmol/L (ref 20–29)
Calcium: 9.7 mg/dL (ref 8.6–10.2)
Chloride: 100 mmol/L (ref 96–106)
Creatinine, Ser: 1.21 mg/dL (ref 0.76–1.27)
Glucose: 94 mg/dL (ref 70–99)
Potassium: 4.5 mmol/L (ref 3.5–5.2)
Sodium: 140 mmol/L (ref 134–144)
eGFR: 63 mL/min/{1.73_m2} (ref >59)

## 2023-07-10 ENCOUNTER — Telehealth: Payer: Self-pay | Admitting: Cardiology

## 2023-07-10 ENCOUNTER — Ambulatory Visit: Payer: Commercial Managed Care - PPO | Attending: Cardiology | Admitting: *Deleted

## 2023-07-10 DIAGNOSIS — I480 Paroxysmal atrial fibrillation: Secondary | ICD-10-CM | POA: Diagnosis not present

## 2023-07-10 DIAGNOSIS — Z5181 Encounter for therapeutic drug level monitoring: Secondary | ICD-10-CM

## 2023-07-10 LAB — POCT INR: INR: 2.3 (ref 2.0–3.0)

## 2023-07-10 NOTE — Telephone Encounter (Signed)
Pt is having a hard time getting his Entresto refilled.

## 2023-07-10 NOTE — Telephone Encounter (Signed)
Call placed to pharmacy - states his Sherryll Burger needs prior authorization.  States they have faxed multiple times but not seeing this in his chart.  Will fwd for PA.

## 2023-07-10 NOTE — Patient Instructions (Signed)
Continue warfarin 1/2 tablet daily except 1 tablet on Tuesdays and Saturdays.  Recheck in 1 wk before colonoscopy.  Will hold warfarin 5 days before procedure and bridge with Lovenox. Pt has decided to stay on warfarin for now.

## 2023-07-11 ENCOUNTER — Other Ambulatory Visit (HOSPITAL_COMMUNITY): Payer: Self-pay

## 2023-07-11 ENCOUNTER — Telehealth: Payer: Self-pay | Admitting: Pharmacy Technician

## 2023-07-11 MED ORDER — ENTRESTO 24-26 MG PO TABS: 1.0000 | ORAL_TABLET | Freq: Two times a day (BID) | ORAL | 3 refills | Status: DC

## 2023-07-11 NOTE — Telephone Encounter (Signed)
Pharmacy Patient Advocate Encounter  Received notification from [facility] that Prior Authorization for entresto has been APPROVED from 07/11/23 to 07/09/24   PA #/Case ID/Reference #: [id number]

## 2023-07-11 NOTE — Telephone Encounter (Signed)
Pharmacy Patient Advocate Encounter   Received notification from Pt Calls Messages that prior authorization for entresto is required/requested.   Insurance verification completed.   The patient is insured through [facility] .   Per test claim: PA required; PA submitted to [facility] via CoverMyMeds Key/confirmation #/EOC [id number] Status is pending

## 2023-07-11 NOTE — Telephone Encounter (Signed)
Pt notified Entresto PA has been approved.

## 2023-07-24 ENCOUNTER — Ambulatory Visit: Payer: Commercial Managed Care - PPO | Admitting: Cardiology

## 2023-07-28 ENCOUNTER — Ambulatory Visit: Payer: Commercial Managed Care - PPO | Attending: Cardiovascular Disease | Admitting: Cardiovascular Disease

## 2023-07-28 ENCOUNTER — Encounter: Payer: Self-pay | Admitting: Cardiovascular Disease

## 2023-07-28 VITALS — BP 110/70 | HR 84 | Ht 72.0 in | Wt 227.1 lb

## 2023-07-28 DIAGNOSIS — I4811 Longstanding persistent atrial fibrillation: Secondary | ICD-10-CM | POA: Diagnosis not present

## 2023-07-28 DIAGNOSIS — I255 Ischemic cardiomyopathy: Secondary | ICD-10-CM | POA: Diagnosis not present

## 2023-07-28 NOTE — Patient Instructions (Addendum)

## 2023-07-28 NOTE — Progress Notes (Signed)
   PCP: Donetta Potts, MD Primary Cardiologist: Dr Diona Browner Primary EP: Dr Marijo File is a 72 y.o. male who presents today for routine electrophysiology followup.  He has a St Jude/Abbott ICD implanted for primary prevention.  Since last being seen in our clinic, the patient reports doing very well.    Today, he denies symptoms of palpitations, chest pain, shortness of breath,  lower extremity edema, dizziness, presyncope, syncope, or ICD shocks. he has no device related complaints -- no new tenderness, drainage, redness.  The patient is otherwise without complaint today.    Physical Exam: Vitals:   07/28/23 0905  BP: 110/70  Pulse: 84  SpO2: 95%  Weight: 227 lb 1.6 oz (103 kg)  Height: 6' (1.829 m)     Gen: Appears comfortable, well-nourished CV: RRR, no dependent edema The device site is normal -- no tenderness, edema, drainage, redness, threatened erosion.  Pulm: breathing easily   ICD interrogation- reviewed in detail today,  See PACEART report   Wt Readings from Last 3 Encounters:  07/28/23 227 lb 1.6 oz (103 kg)  07/05/23 229 lb 3.2 oz (104 kg)  06/13/23 228 lb 9.6 oz (103.7 kg)    Assessment and Plan:  Chronic systolic dysfunction/ CAD/ ischemic CM euvolemic today I reviewed TTE report from 06/22/2022 Stable on an appropriate medical regimen Normal ICD function See Pace Art report No changes today he is not device dependant today; < 1% V-paced followed in ICM device clinic  Persistent afib Rate controlled Chads2vasc score is 6. On coumadin    Risks, benefits and potential toxicities for medications prescribed and/or refilled reviewed with patient today.   Return in a year  Maurice Small, MD  07/28/2023 10:08 AM

## 2023-07-31 ENCOUNTER — Ambulatory Visit: Payer: Commercial Managed Care - PPO | Attending: Cardiovascular Disease

## 2023-07-31 ENCOUNTER — Telehealth: Payer: Self-pay | Admitting: *Deleted

## 2023-07-31 DIAGNOSIS — Z95810 Presence of automatic (implantable) cardiac defibrillator: Secondary | ICD-10-CM

## 2023-07-31 DIAGNOSIS — I5022 Chronic systolic (congestive) heart failure: Secondary | ICD-10-CM | POA: Diagnosis not present

## 2023-07-31 NOTE — Telephone Encounter (Signed)
[facility] PA: Case ID# [id number] was submitted on 07-31-2023

## 2023-08-01 ENCOUNTER — Ambulatory Visit: Payer: Commercial Managed Care - PPO | Attending: Cardiology | Admitting: *Deleted

## 2023-08-01 ENCOUNTER — Telehealth: Payer: Self-pay

## 2023-08-01 DIAGNOSIS — Z5181 Encounter for therapeutic drug level monitoring: Secondary | ICD-10-CM

## 2023-08-01 DIAGNOSIS — I480 Paroxysmal atrial fibrillation: Secondary | ICD-10-CM | POA: Diagnosis not present

## 2023-08-01 LAB — POCT INR: INR: 2.7 (ref 2.0–3.0)

## 2023-08-01 MED ORDER — ENOXAPARIN SODIUM 150 MG/ML IJ SOSY: 150.0000 mg | PREFILLED_SYRINGE | INTRAMUSCULAR | 0 refills | Status: DC

## 2023-08-01 NOTE — Patient Instructions (Addendum)
08/09/23  Colonoscopy   Labs:  07/06/23  Scr 1.21  CrCl 79.21               01/26/23   Hgb 16.9  Hct 49.9  Plts 150  Weight 103kg  11/14  Last dose of warfarin 11/15  No lovenox or warfarin 11/16 - 11/19  Lovenox 150mg  sq at 6am 11/20  No lovenox in am-----procedure-----warfarin 5mg  pm (If ok with MD) 11/21  Lovenox 150mg  SQ at 6am and Warfarin 5mg  pm 11/22   Lovenox 150mg  SQ at 6am and Warfarin 2.5mg  pm 11/23   Lovenox 150mg  SQ at 6am and Warfarin 5mg  pm 11/24 - 11/25   Lovenox 150mg  SQ at 6am and Warfarin 2.5mg  pm 11/26   Lovenox 150mg  SQ at 6am and INR appt at 9:15am

## 2023-08-01 NOTE — Progress Notes (Signed)
EPIC Encounter for ICM Monitoring  Patient Name: [name] is a 71 y.o. male Date: 08/01/2023 Primary Care Physican: Donetta Potts, MD Primary Cardiologist: Diona Browner Electrophysiologist: Mealor 10/21/2022 Weight: 218-220 lbs 02/01/2023 Weight: 218 lbs 03/10/2023 Weight: 218 lbs 05/18/2023 Weight: 218 lbs 06/30/2023 Weight: 221 lbs     Attempted call to patient and unable to reach.  Left detailed message per DPR regarding transmission. Transmission reviewed.    Corvue Thoracic impedance suggesting possible fluid accumulation starting 11/10.   Prescribed:  Spironolactone 25 mg take 0.5 tablet (12.5 mg total) daily. Jardiance 10 mg take 1 tablet by mouth before breakfast   Recommendations:   Unable to reach.     Follow-up plan: ICM clinic phone appointment on 08/07/2023 to recheck fluid levels.   91 day device clinic remote transmission 09/04/2023.       EP/Cardiology Office visits:  07/26/2024 with Dr Nelly Laurence.   Recall 01/01/2024 with Dr Diona Browner.   Copy of ICM check sent to Dr. Nelly Laurence.   3 month ICM trend: 08/01/2023.    12-14 Month ICM trend:     Karie Soda, RN 08/01/2023 3:30 PM

## 2023-08-01 NOTE — Telephone Encounter (Signed)
Remote ICM transmission received.  Attempted call to patient regarding ICM remote transmission and no answer.  Voice mail box is full.

## 2023-08-02 NOTE — Telephone Encounter (Signed)
[facility] PA: Aerial Case ID: [id number] Aerial Resource [id number] Aerial Case Status: Approved

## 2023-08-04 NOTE — Patient Instructions (Signed)
[name]  08/04/2023     @PREFPERIOPPHARMACY @   Your procedure is scheduled on 08/09/2023.  Report to [facility] at 8:15 A.M.  Call this number if you have problems the morning of surgery:  [phone]  If you experience any cold or flu symptoms such as cough, fever, chills, shortness of breath, etc. between now and your scheduled surgery, please notify us at the above number.   Remember:  Do not eat anything after midnight.  You may drink clear liquids until 6:15am .  Clear liquids allowed are:                    Water, Carbonated beverages (diabetics please choose diet or no sugar options), Clear Tea (No creamer, milk, or cream, including half & half and powdered creamer), Black Coffee Only (No creamer, milk or cream, including half & half and powdered creamer), and Clear Sports drink (No red color; diabetics please choose diet or no sugar options)    Take these medicines the morning of surgery with A SIP OF WATER : Bisoprolol Entresto   Last dose of Coumadin should be on 08/04/23 and start bridge with Lovenox per MD instrucitons.   Last dose of Jardiance should be on 08/05/2023.   Do not take any diabetic medications the am of the procedure.     Do not wear jewelry, make-up or nail polish, including gel polish,  artificial nails, or any other type of covering on natural nails (fingers and  toes).  Do not wear lotions, powders, or perfumes, or deodorant.  Do not shave 48 hours prior to surgery.  Men may shave face and neck.  Do not bring valuables to the hospital.  [facility] is not responsible for any belongings or valuables.  Contacts, dentures or bridgework may not be worn into surgery.  Leave your suitcase in the car.  After surgery it may be brought to your room.  For patients admitted to the hospital, discharge time will be determined by your treatment team.  Patients discharged the day of surgery will not be allowed to drive home.   Name and phone number  of your driver:   Family Special instructions:  N/A  Please read over the following fact sheets that you were given. Care and Recovery After Surgery  Colonoscopy, Adult A colonoscopy is a procedure to look at the entire large intestine. This procedure is done using a long, thin, flexible tube that has a camera on the end. You may have a colonoscopy: As a part of normal colorectal screening. If you have certain symptoms, such as: A low number of red blood cells in your blood (anemia). Diarrhea that does not go away. Pain in your abdomen. Blood in your stool. A colonoscopy can help screen for and diagnose medical problems, including: An abnormal growth of cells or tissue (tumor). Abnormal growths within the lining of your intestine (polyps). Inflammation. Areas of bleeding. Tell your health care provider about: Any allergies you have. All medicines you are taking, including vitamins, herbs, eye drops, creams, and over-the-counter medicines. Any problems you or family members have had with anesthetic medicines. Any bleeding problems you have. Any surgeries you have had. Any medical conditions you have. Any problems you have had with having bowel movements. Whether you are pregnant or may be pregnant. What are the risks? Generally, this is a safe procedure. However, problems may occur, including: Bleeding. Damage to your intestine. Allergic reactions to medicines given during the  procedure. Infection. This is rare. What happens before the procedure? Eating and drinking restrictions Follow instructions from your health care provider about eating or drinking restrictions, which may include: A few days before the procedure: Follow a low-fiber diet. Avoid nuts, seeds, dried fruit, raw fruits, and vegetables. 1-3 days before the procedure: Eat only gelatin dessert or ice pops. Drink only clear liquids, such as water, clear juice, clear broth or bouillon, black coffee or tea, or  clear soft drinks or sports drinks. Avoid liquids that contain red or purple dye. The day of the procedure: Do not eat solid foods. You may continue to drink clear liquids until up to 2 hours before the procedure. Do not eat or drink anything starting 2 hours before the procedure, or within the time period that your health care provider recommends. Bowel prep If you were prescribed a bowel prep to take by mouth (orally) to clean out your colon: Take it as told by your health care provider. Starting the day before your procedure, you will need to drink a large amount of liquid medicine. The liquid will cause you to have many bowel movements of loose stool until your stool becomes almost clear or light green. If your skin or the opening between the buttocks (anus) gets irritated from diarrhea, you may relieve the irritation using: Wipes with medicine in them, such as adult wet wipes with aloe and vitamin E. A product to soothe skin, such as petroleum jelly. If you vomit while drinking the bowel prep: Take a break for up to 60 minutes. Begin the bowel prep again. Call your health care provider if you keep vomiting or you cannot take the bowel prep without vomiting. To clean out your colon, you may also be given: Laxative medicines. These help you have a bowel movement. Instructions for enema use. An enema is liquid medicine injected into your rectum. Medicines Ask your health care provider about: Changing or stopping your regular medicines or supplements. This is especially important if you are taking iron supplements, diabetes medicines, or blood thinners. Taking medicines such as aspirin and ibuprofen. These medicines can thin your blood. Do not take these medicines unless your health care provider tells you to take them. Taking over-the-counter medicines, vitamins, herbs, and supplements. General instructions Ask your health care provider what steps will be taken to help prevent infection.  These may include washing skin with a germ-killing soap. If you will be going home right after the procedure, plan to have a responsible adult: Take you home from the hospital or clinic. You will not be allowed to drive. Care for you for the time you are told. What happens during the procedure?  An IV will be inserted into one of your veins. You will be given a medicine to make you fall asleep (general anesthetic). You will lie on your side with your knees bent. A lubricant will be put on the tube. Then the tube will be: Inserted into your anus. Gently eased through all parts of your large intestine. Air will be sent into your colon to keep it open. This may cause some pressure or cramping. Images will be taken with the camera and will appear on a screen. A small tissue sample may be removed to be looked at under a microscope (biopsy). The tissue may be sent to a lab for testing if any signs of problems are found. If small polyps are found, they may be removed and checked for cancer cells. When the procedure is  finished, the tube will be removed. The procedure may vary among health care providers and hospitals. What happens after the procedure? Your blood pressure, heart rate, breathing rate, and blood oxygen level will be monitored until you leave the hospital or clinic. You may have a small amount of blood in your stool. You may pass gas and have mild cramping or bloating in your abdomen. This is caused by the air that was used to open your colon during the exam. If you were given a sedative during the procedure, it can affect you for several hours. Do not drive or operate machinery until your health care provider says that it is safe. It is up to you to get the results of your procedure. Ask your health care provider, or the department that is doing the procedure, when your results will be ready. Summary A colonoscopy is a procedure to look at the entire large intestine. Follow  instructions from your health care provider about eating and drinking before the procedure. If you were prescribed an oral bowel prep to clean out your colon, take it as told by your health care provider. During the colonoscopy, a flexible tube with a camera on its end is inserted into the anus and then passed into all parts of the large intestine. This information is not intended to replace advice given to you by your health care provider. Make sure you discuss any questions you have with your health care provider. Document Revised: 10/18/2022 Document Reviewed: 04/28/2021 Elsevier Patient Education  2024 [employer]  Monitored Anesthesia Care Anesthesia refers to the techniques, procedures, and medicines that help a person stay safe and comfortable during surgery. Monitored anesthesia care, or sedation, is one type of anesthesia. You may have sedation if you do not need to be asleep for your procedure. Procedures that use sedation may include: Surgery to remove cataracts from your eyes. A dental procedure. A biopsy. This is when a tissue sample is removed and looked at under a microscope. You will be watched closely during your procedure. Your level of sedation or type of anesthesia may be changed to fit your needs. Tell a health care provider about: Any allergies you have. All medicines you are taking, including vitamins, herbs, eye drops, creams, and over-the-counter medicines. Any problems you or family members have had with anesthesia. Any bleeding problems you have. Any surgeries you have had. Any medical conditions or illnesses you have. This includes sleep apnea, cough, fever, or the flu. Whether you are pregnant or may be pregnant. Whether you use cigarettes, alcohol, or drugs. Any use of steroids, whether by mouth or as a cream. What are the risks? Your health care provider will talk with you about risks. These may include: Getting too much medicine  (oversedation). Nausea. Allergic reactions to medicines. Trouble breathing. If this happens, a breathing tube may be used to help you breathe. It will be removed when you are awake and breathing on your own. Heart trouble. Lung trouble. Confusion that gets better with time (emergence delirium). What happens before the procedure? When to stop eating and drinking Follow instructions from your health care provider about what you may eat and drink. These may include: 8 hours before your procedure Stop eating most foods. Do not eat meat, fried foods, or fatty foods. Eat only light foods, such as toast or crackers. All liquids are okay except energy drinks and alcohol. 6 hours before your procedure Stop eating. Drink only clear liquids, such as water, clear fruit  juice, black coffee, plain tea, and sports drinks. Do not drink energy drinks or alcohol. 2 hours before your procedure Stop drinking all liquids. You may be allowed to take medicines with small sips of water. If you do not follow your health care provider's instructions, your procedure may be delayed or canceled. Medicines Ask your health care provider about: Changing or stopping your regular medicines. These include any diabetes medicines or blood thinners you take. Taking medicines such as aspirin and ibuprofen. These medicines can thin your blood. Do not take them unless your health care provider tells you to. Taking over-the-counter medicines, vitamins, herbs, and supplements. Testing You may have an exam or testing. You may have a blood or urine sample taken. General instructions Do not use any products that contain nicotine or tobacco for at least 4 weeks before the procedure. These products include cigarettes, chewing tobacco, and vaping devices, such as e-cigarettes. If you need help quitting, ask your health care provider. If you will be going home right after the procedure, plan to have a responsible adult: Take you  home from the hospital or clinic. You will not be allowed to drive. Care for you for the time you are told. What happens during the procedure?  Your blood pressure, heart rate, breathing, level of pain, and blood oxygen level will be monitored. An IV will be inserted into one of your veins. You may be given: A sedative. This helps you relax. Anesthesia. This will: Numb certain areas of your body. Make you fall asleep for surgery. You will be given medicines as needed to keep you comfortable. The more medicine you are given, the deeper your level of sedation will be. Your level of sedation may be changed to fit your needs. There are three levels of sedation: Mild sedation. At this level, you may feel awake and relaxed. You will be able to follow directions. Moderate sedation. At this level, you will be sleepy. You may not remember the procedure. Deep sedation. At this level, you will be asleep. You will not remember the procedure. How you get the medicines will depend on your age and the procedure. They may be given as: A pill. This may be taken by mouth (orally) or inserted into the rectum. An injection. This may be into a vein or muscle. A spray through the nose. After your procedure is over, the medicine will be stopped. The procedure may vary among health care providers and hospitals. What happens after the procedure? Your blood pressure, heart rate, breathing rate, and blood oxygen level will be monitored until you leave the hospital or clinic. You may feel sleepy, clumsy, or nauseous. You may not remember what happened during or after the procedure. Sedation can affect you for several hours. Do not drive or use machinery until your health care provider says that it is safe. This information is not intended to replace advice given to you by your health care provider. Make sure you discuss any questions you have with your health care provider. Document Revised: 01/31/2022 Document  Reviewed: 01/31/2022 Elsevier Patient Education  2024 [employer].

## 2023-08-07 ENCOUNTER — Ambulatory Visit: Payer: Commercial Managed Care - PPO | Attending: Cardiovascular Disease

## 2023-08-07 ENCOUNTER — Encounter (HOSPITAL_COMMUNITY)
Admission: RE | Admit: 2023-08-07 | Discharge: 2023-08-07 | Disposition: A | Discharge status: Home / Self Care | Payer: Commercial Managed Care - PPO | Source: Ambulatory Visit | Attending: Internal Medicine | Admitting: Internal Medicine

## 2023-08-07 ENCOUNTER — Encounter (HOSPITAL_COMMUNITY): Payer: Self-pay

## 2023-08-07 DIAGNOSIS — Z95810 Presence of automatic (implantable) cardiac defibrillator: Secondary | ICD-10-CM

## 2023-08-07 DIAGNOSIS — I5022 Chronic systolic (congestive) heart failure: Secondary | ICD-10-CM

## 2023-08-08 NOTE — Progress Notes (Signed)
EPIC Encounter for ICM Monitoring  Patient Name: [name] is a 72 y.o. male Date: 08/08/2023 Primary Care Physican: Donetta Potts, MD Primary Cardiologist: Diona Browner Electrophysiologist: Mealor 10/21/2022 Weight: 218-220 lbs 02/01/2023 Weight: 218 lbs 03/10/2023 Weight: 218 lbs 05/18/2023 Weight: 218 lbs 06/30/2023 Weight: 221 lbs     Transmission reviewed.    Corvue Thoracic impedance suggesting fluid levels returned to normal.   Prescribed:  Spironolactone 25 mg take 0.5 tablet (12.5 mg total) daily. Jardiance 10 mg take 1 tablet by mouth before breakfast   Recommendations:   No changes.   Follow-up plan: ICM clinic phone appointment on 09/11/2023.   91 day device clinic remote transmission 09/04/2023.       EP/Cardiology Office visits:  07/26/2024 with Dr Nelly Laurence.   Recall 01/01/2024 with Dr Diona Browner.   Copy of ICM check sent to Dr. Nelly Laurence.   3 month ICM trend: 08/07/2023.    12-14 Month ICM trend:     Karie Soda, RN 08/08/2023 6:19 AM

## 2023-08-09 ENCOUNTER — Encounter (HOSPITAL_COMMUNITY)
Admission: RE | Disposition: A | Discharge status: Home / Self Care | Payer: Self-pay | Source: Home / Self Care | Attending: Internal Medicine

## 2023-08-09 ENCOUNTER — Ambulatory Visit (HOSPITAL_BASED_OUTPATIENT_CLINIC_OR_DEPARTMENT_OTHER): Payer: Commercial Managed Care - PPO | Admitting: Anesthesiology

## 2023-08-09 ENCOUNTER — Ambulatory Visit (HOSPITAL_COMMUNITY)
Admission: RE | Admit: 2023-08-09 | Discharge: 2023-08-09 | Disposition: A | Discharge status: Home / Self Care | Payer: Commercial Managed Care - PPO | Attending: Internal Medicine | Admitting: Internal Medicine

## 2023-08-09 ENCOUNTER — Ambulatory Visit (HOSPITAL_COMMUNITY): Payer: Commercial Managed Care - PPO | Admitting: Anesthesiology

## 2023-08-09 DIAGNOSIS — Z1211 Encounter for screening for malignant neoplasm of colon: Secondary | ICD-10-CM | POA: Diagnosis present

## 2023-08-09 DIAGNOSIS — G4730 Sleep apnea, unspecified: Secondary | ICD-10-CM | POA: Insufficient documentation

## 2023-08-09 DIAGNOSIS — I2510 Atherosclerotic heart disease of native coronary artery without angina pectoris: Secondary | ICD-10-CM | POA: Insufficient documentation

## 2023-08-09 DIAGNOSIS — Z7901 Long term (current) use of anticoagulants: Secondary | ICD-10-CM | POA: Diagnosis not present

## 2023-08-09 DIAGNOSIS — Z8673 Personal history of transient ischemic attack (TIA), and cerebral infarction without residual deficits: Secondary | ICD-10-CM | POA: Diagnosis not present

## 2023-08-09 DIAGNOSIS — I110 Hypertensive heart disease with heart failure: Secondary | ICD-10-CM | POA: Insufficient documentation

## 2023-08-09 DIAGNOSIS — I4891 Unspecified atrial fibrillation: Secondary | ICD-10-CM | POA: Diagnosis not present

## 2023-08-09 DIAGNOSIS — Z860100 Personal history of colon polyps, unspecified: Secondary | ICD-10-CM

## 2023-08-09 DIAGNOSIS — I252 Old myocardial infarction: Secondary | ICD-10-CM | POA: Insufficient documentation

## 2023-08-09 DIAGNOSIS — Z860101 Personal history of adenomatous and serrated colon polyps: Secondary | ICD-10-CM | POA: Insufficient documentation

## 2023-08-09 DIAGNOSIS — I5022 Chronic systolic (congestive) heart failure: Secondary | ICD-10-CM | POA: Diagnosis not present

## 2023-08-09 HISTORY — PX: FLEXIBLE SIGMOIDOSCOPY: SHX5431

## 2023-08-09 SURGERY — SIGMOIDOSCOPY, FLEXIBLE
Anesthesia: General

## 2023-08-09 MED ORDER — LIDOCAINE HCL (PF) 2 % IJ SOLN
INTRAMUSCULAR | Status: DC | PRN
  Administered 2023-08-09: 50 mg via INTRADERMAL

## 2023-08-09 MED ORDER — LACTATED RINGERS IV SOLN: INTRAVENOUS | Status: DC

## 2023-08-09 MED ORDER — PHENYLEPHRINE 80 MCG/ML (10ML) SYRINGE FOR IV PUSH (FOR BLOOD PRESSURE SUPPORT)
PREFILLED_SYRINGE | INTRAVENOUS | Status: AC
  Filled 2023-08-09: qty 10

## 2023-08-09 MED ORDER — EPHEDRINE 5 MG/ML INJ
INTRAVENOUS | Status: AC
  Filled 2023-08-09: qty 5

## 2023-08-09 MED ORDER — EPHEDRINE SULFATE-NACL 50-0.9 MG/10ML-% IV SOSY
PREFILLED_SYRINGE | INTRAVENOUS | Status: DC | PRN
  Administered 2023-08-09: 5 mg via INTRAVENOUS

## 2023-08-09 MED ORDER — PHENYLEPHRINE 80 MCG/ML (10ML) SYRINGE FOR IV PUSH (FOR BLOOD PRESSURE SUPPORT)
PREFILLED_SYRINGE | INTRAVENOUS | Status: DC | PRN
  Administered 2023-08-09 (×4): 160 ug via INTRAVENOUS

## 2023-08-09 MED ORDER — PROPOFOL 10 MG/ML IV BOLUS
INTRAVENOUS | Status: DC | PRN
  Administered 2023-08-09: 70 mg via INTRAVENOUS

## 2023-08-09 MED ORDER — PROPOFOL 500 MG/50ML IV EMUL
INTRAVENOUS | Status: DC | PRN
  Administered 2023-08-09: 80 ug/kg/min via INTRAVENOUS

## 2023-08-09 NOTE — Anesthesia Preprocedure Evaluation (Signed)
Anesthesia Evaluation  Patient identified by MRN, date of birth, ID band Patient awake    Reviewed: Allergy & Precautions, H&P , NPO status , Patient's Chart, lab work & pertinent test results, reviewed documented beta blocker date and time   Airway Mallampati: II  TM Distance: >3 FB Neck ROM: full    Dental no notable dental hx.    Pulmonary neg pulmonary ROS, sleep apnea , pneumonia   Pulmonary exam normal breath sounds clear to auscultation       Cardiovascular Exercise Tolerance: Good hypertension, + angina  + CAD, + Past MI and + DOE  + dysrhythmias Atrial Fibrillation + Cardiac Defibrillator  Rhythm:regular Rate:Normal     Neuro/Psych CVA negative neurological ROS  negative psych ROS   GI/Hepatic negative GI ROS, Neg liver ROS, hiatal hernia,,,  Endo/Other  negative endocrine ROS    Renal/GU negative Renal ROS  negative genitourinary   Musculoskeletal negative musculoskeletal ROS (+)    Abdominal   Peds negative pediatric ROS (+)  Hematology negative hematology ROS (+)   Anesthesia Other Findings   Reproductive/Obstetrics negative OB ROS                             Anesthesia Physical Anesthesia Plan  ASA: 3  Anesthesia Plan: General   Post-op Pain Management:    Induction:   PONV Risk Score and Plan: Propofol infusion  Airway Management Planned:   Additional Equipment:   Intra-op Plan:   Post-operative Plan:   Informed Consent: I have reviewed the patients History and Physical, chart, labs and discussed the procedure including the risks, benefits and alternatives for the proposed anesthesia with the patient or authorized representative who has indicated his/her understanding and acceptance.     Dental Advisory Given  Plan Discussed with: CRNA  Anesthesia Plan Comments:        Anesthesia Quick Evaluation

## 2023-08-09 NOTE — Anesthesia Procedure Notes (Signed)
Date/Time: 08/09/2023 10:46 AM  Performed by: Julian Reil, CRNAPre-anesthesia Checklist: Patient identified, Emergency Drugs available, Patient being monitored and Suction available Patient Re-evaluated:Patient Re-evaluated prior to induction Oxygen Delivery Method: Nasal cannula Induction Type: IV induction Placement Confirmation: positive ETCO2 Comments: Optiflow High Flow [place] O2 used due to H/O OSA.

## 2023-08-09 NOTE — Transfer of Care (Signed)
Immediate Anesthesia Transfer of Care Note  Patient: [name]  Procedure(s) Performed: FLEXIBLE SIGMOIDOSCOPY  Patient Location: Short Stay  Anesthesia Type:General  Level of Consciousness: awake, alert , and oriented  Airway & Oxygen Therapy: Patient Spontanous Breathing  Post-op Assessment: Report given to RN and Post -op Vital signs reviewed and stable  Post vital signs: Reviewed and stable  Last Vitals:  Vitals Value Taken Time  BP    Temp    Pulse    Resp    SpO2      Last Pain:  Vitals:   08/09/23 0937  PainSc: 0-No pain         Complications: No notable events documented.

## 2023-08-09 NOTE — H&P (Signed)
@LOGO @   Primary Care Physician:  Donetta Potts, MD Primary Gastroenterologist:  Dr. Jena Gauss  Pre-Procedure History & Physical: HPI:  [name] is a 72 y.o. male here for surveillance colonoscopy history colonic adenomas.  Chronic diarrhea.  Lithiasis on ultrasound no colonic wall thickening, etc.  Coumadin held.  Bridged with Lovenox.  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 s/p BMS to LAD and OM1 b. 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   right temple   Squamous cell carcinoma in situ (SCCIS) 08/04/2006   right sholder   Squamous cell carcinoma of skin 11/09/2020   in situ- left upper back   Squamous cell carcinoma of skin 11/09/2020   KA-right dorsal hand   Stroke [facility])     Past Surgical History:  Procedure Laterality Date   AICD implantation  2011   St Jude ICD implanted for primary prevention of sudden death   BIOPSY  2020-05-03   Procedure: BIOPSY;  Surgeon: Corbin Ade, MD;  Location: [facility];  Service: Endoscopy;;   CATARACT EXTRACTION W/PHACO Left 03/27/2017   Procedure: CATARACT EXTRACTION PHACO AND INTRAOCULAR LENS PLACEMENT (IOC);  Surgeon: Gemma Payor, MD;  Location: [facility] ORS;  Service: Ophthalmology;  Laterality: Left;  CDE: 8.48   CATARACT EXTRACTION W/PHACO Right  05/29/2017   Procedure: CATARACT EXTRACTION PHACO AND INTRAOCULAR LENS PLACEMENT (IOC);  Surgeon: Gemma Payor, MD;  Location: [facility] ORS;  Service: Ophthalmology;  Laterality: Right;  CDE: 7.48   COLONOSCOPY N/A May 03, 2020   Scattered medium-mouthed diverticula in sigmoid, three sessile polyps in hepatic flexure and cecum. 5-7 mm in size. Tubular adenomas. Benign colonic mucosa. 3 year surveillance.    EYE SURGERY     ICD GENERATOR CHANGEOUT N/A 01/19/2017   SJM Ellipse VR ICD generator change by Dr Johney Frame   LAPAROSCOPY N/A 10/23/2017   Procedure: LAPAROSCOPY DIAGNOSTIC;  Surgeon: Emelia Loron, MD;  Location: [facility] OR;  Service: General;  Laterality: N/A;   LEFT HEART CATH AND CORONARY ANGIOGRAPHY N/A 06/21/2018   Procedure: LEFT HEART CATH AND CORONARY ANGIOGRAPHY;  Surgeon: Marykay Lex, MD;  Location: [facility] INVASIVE CV LAB;  Service: Cardiovascular;  Laterality: N/A;   POLYPECTOMY  2020-05-03   Procedure: POLYPECTOMY;  Surgeon: Corbin Ade, MD;  Location: [facility];  Service: Endoscopy;;   VENTRAL HERNIA REPAIR N/A 10/23/2017   Procedure: VENTRAL HERNIA REPAIR;  Surgeon: Emelia Loron, MD;  Location: [facility] OR;  Service: General;  Laterality: N/A;    Prior to Admission medications   Medication Sig Start Date End Date Taking? Authorizing Provider  bisoprolol (ZEBETA) 5 MG tablet TAKE 1 TABLET (5 MG TOTAL) BY MOUTH DAILY. 03/06/23  Yes BranchDorothe Pea, MD  enoxaparin (LOVENOX) 150 MG/ML injection Inject 1 mL (  150 mg total) into the skin daily. At 6am 08/01/23  Yes Jonelle Sidle, MD  rosuvastatin (CRESTOR) 20 MG tablet Take 1 tablet (20 mg total) by mouth daily. 07/05/23  Yes Jonelle Sidle, MD  sacubitril-valsartan (ENTRESTO) 24-26 MG Take 1 tablet by mouth 2 (two) times daily. 07/11/23  Yes Jonelle Sidle, MD  spironolactone (ALDACTONE) 25 MG tablet TAKE 1/2 TABLET BY MOUTH EVERY DAY 07/03/23  Yes Jonelle Sidle, MD  aspirin 81 MG tablet Take 81 mg by mouth daily.     [provider]  dicyclomine (BENTYL) 10 MG capsule TAKE 1 CAPSULE (10 MG TOTAL) BY MOUTH 3 (THREE) TIMES DAILY BEFORE MEALS. FOR FREQUENT STOOLS. 07/10/23   Gelene Mink, NP  JARDIANCE 10 MG TABS tablet TAKE 1 TABLET BY MOUTH EVERY DAY BEFORE BREAKFAST 06/12/23   Jonelle Sidle, MD  Na Sulfate-K Sulfate-Mg Sulf 17.5-3.13-1.6 GM/177ML SOLN As directed 06/14/23   Kian Ottaviano, Gerrit Friends, MD  nitroGLYCERIN (NITROSTAT) 0.4 MG SL tablet Place 0.4 mg under the tongue every 5 (five) minutes x 3 doses as needed for chest pain (dissolve under tongue if no relief after 3rd dose proceed to ED or call 911).    [provider]  warfarin (COUMADIN) 5 MG tablet TAKE 1/2 TABLET DAILY EXCEPT 1 TABLET ON TUESDAYS, THURSDAYS AND SATURDAYS OR AS DIRECTED 07/04/22   Jonelle Sidle, MD    Allergies as of 06/14/2023   (No Known Allergies)    Family History  Problem Relation Age of Onset   Cancer Mother        lymphoma    Heart attack Father    Heart disease Brother    Colon cancer Neg Hx    Colon polyps Neg Hx     Social History   Socioeconomic History   Marital status: Married    Spouse name: Not on file   Number of children: Not on file   Years of education: Not on file   Highest education level: Not on file  Occupational History   Occupation: retired    Comment: Banking  Tobacco Use   Smoking status: Never    Passive exposure: Never   Smokeless tobacco: Never  Vaping Use   Vaping status: Never Used  Substance and Sexual Activity   Alcohol use: Yes    Comment: occasional "moderate"   Drug use: No   Sexual activity: Yes  Other Topics Concern   Not on file  Social History Narrative   Not on file   Social Determinants of Health   Financial Resource Strain: Not on file  Food Insecurity: Not on file  Transportation Needs: Not on file  Physical Activity: Not on file  Stress: Not on file  Social Connections: Not on file  Intimate Partner Violence: Not on file     Review of Systems: See HPI, otherwise negative ROS  Physical Exam: BP 97/76 (BP Location: Right Arm)   Pulse 86   Temp 97.8 F (36.6 C)   Resp 12   SpO2 97%  General:   Alert,  Well-developed, well-nourished, pleasant and cooperative in NAD Neck:  Supple; no masses or thyromegaly. No significant cervical adenopathy. Lungs:  Clear throughout to auscultation.   No wheezes, crackles, or rhonchi. No acute distress. Heart:  Regular rate and rhythm; no murmurs, clicks, rubs,  or gallops. Abdomen: Non-distended, normal bowel sounds.  Soft and nontender without appreciable mass or hepatosplenomegaly.   Impression/Plan: History of polyps.  Here for surveillance colonoscopy  The risks, benefits, limitations, alternatives and imponderables have been reviewed with the patient. Questions have been answered. All parties are agreeable.       Notice: This dictation was prepared with Dragon dictation along with smaller phrase technology. Any transcriptional errors that result from this process are unintentional and may not be corrected upon review.

## 2023-08-09 NOTE — Discharge Instructions (Addendum)
  Sigmoidoscopy Discharge Instructions  Read the instructions outlined below and refer to this sheet in the next few weeks. These discharge instructions provide you with general information on caring for yourself after you leave the hospital. Your doctor may also give you specific instructions. While your treatment has been planned according to the most current medical practices available, unavoidable complications occasionally occur. If you have any problems or questions after discharge, call Dr. Jena Gauss at [phone]. ACTIVITY You may resume your regular activity, but move at a slower pace for the next 24 hours.  Take frequent rest periods for the next 24 hours.  Walking will help get rid of the air and reduce the bloated feeling in your belly (abdomen).  No driving for 24 hours (because of the medicine (anesthesia) used during the test).   Do not sign any important legal documents or operate any machinery for 24 hours (because of the anesthesia used during the test).  NUTRITION Drink plenty of fluids.  You may resume your normal diet as instructed by your doctor.  Begin with a light meal and progress to your normal diet. Heavy or fried foods are harder to digest and may make you feel sick to your stomach (nauseated).  Avoid alcoholic beverages for 24 hours or as instructed.  MEDICATIONS You may resume your normal medications unless your doctor tells you otherwise.  WHAT YOU CAN EXPECT TODAY Some feelings of bloating in the abdomen.  Passage of more gas than usual.  Spotting of blood in your stool or on the toilet paper.  IF YOU HAD POLYPS REMOVED DURING THE COLONOSCOPY: No aspirin products for 7 days or as instructed.  No alcohol for 7 days or as instructed.  Eat a soft diet for the next 24 hours.  FINDING OUT THE RESULTS OF YOUR TEST Not all test results are available during your visit. If your test results are not back during the visit, make an appointment with your caregiver to find out  the results. Do not assume everything is normal if you have not heard from your caregiver or the medical facility. It is important for you to follow up on all of your test results.  SEEK IMMEDIATE MEDICAL ATTENTION IF: You have more than a spotting of blood in your stool.  Your belly is swollen (abdominal distention).  You are nauseated or vomiting.  You have a temperature over 101.  You have abdominal pain or discomfort that is severe or gets worse throughout the day.    Your colon was not cleaned out.  Therefore, we could not complete your colonoscopy today.  My office will be in touch about rescheduling.  At patient request, I called Kasan Lightle at [phone] unable to make contact  Resume Lovenox and Coumadin today.  Continue Lovenox until instructed to stop per the Coumadin clinic.

## 2023-08-09 NOTE — Op Note (Signed)
[facility] Patient Name: [name] Procedure Date: 08/09/2023 10:22 AM MRN: [id number] Date of Birth: [birth year] Attending MD: Gennette Pac , MD, [id number] CSN: [id number] Age: 72 Admit Type: Outpatient Procedure:                Attempted colonoscopy (sigmoidoscopy) Indications:              High risk colon cancer surveillance: Personal                            history of colonic polyps Providers:                Gennette Pac, MD, Nena Polio, RN, Francoise Ceo RN, RN, Elinor Parkinson Referring MD:              Medicines:                Propofol per Anesthesia Complications:            No immediate complications. No immediate                            complications. Estimated blood loss: None. Estimated Blood Loss:     Estimated blood loss: none. Procedure:                Pre-Anesthesia Assessment:                           - Prior to the procedure, a History and Physical                            was performed, and patient medications and                            allergies were reviewed. The patient's tolerance of                            previous anesthesia was also reviewed. The risks                            and benefits of the procedure and the sedation                            options and risks were discussed with the patient.                            All questions were answered, and informed consent                            was obtained. Prior Anticoagulants: The patient has                            taken no anticoagulant or antiplatelet agents. ASA  Grade Assessment: III - A patient with severe                            systemic disease. After reviewing the risks and                            benefits, the patient was deemed in satisfactory                            condition to undergo the procedure.                           After obtaining informed consent, the colonoscope                             was passed under direct vision. Throughout the                            procedure, the patient's blood pressure, pulse, and                            oxygen saturations were monitored continuously. The                            [id number]) scope was introduced through the                            anus with the intention of advancing to the cecum.                            The scope was advanced to the sigmoid colon before                            the procedure was aborted. Medications were given.                            The entire colon was well visualized. Scope In: 10:52:35 AM Scope Out: 10:55:54 AM Total Procedure Duration: 0 hours 3 minutes 19 seconds  Findings:      The perianal and digital rectal examinations were normal.      Endoscopic findings: Poor prep with formed stool in the rectum trailing       up into the sigmoid. Procedure aborted. Impression:               -Procedure aborted. An adequate preparation Moderate Sedation:      Moderate (conscious) sedation was personally administered by an       anesthesia professional. The following parameters were monitored: oxygen       saturation, heart rate, blood pressure, respiratory rate, EKG, adequacy       of pulmonary ventilation, and response to care. Recommendation:           - Patient has a contact number available for                            emergencies. The signs  and symptoms of potential                            delayed complications were discussed with the                            patient. Return to normal activities tomorrow.                            Written discharge instructions were provided to the                            patient.                           - Advance diet as tolerated.                           - Continue present medications.                           -Reschedule ASAP.[name] Procedure Code(s):        --- Professional ---                            [id number], 53, Colonoscopy, flexible; diagnostic,                            including collection of specimen(s) by brushing or                            washing, when performed (separate procedure) Diagnosis Code(s):        --- Professional ---                           Z86.010, Personal history of colonic polyps CPT copyright 2022 American Medical Association. All rights reserved. The codes documented in this report are preliminary and upon coder review may  be revised to meet current compliance requirements. Gerrit Friends. Jannetta Massey, MD Gennette Pac, MD 08/09/2023 11:11:01 AM This report has been signed electronically. Number of Addenda: 0

## 2023-08-10 NOTE — Anesthesia Postprocedure Evaluation (Signed)
Anesthesia Post Note  Patient: [name]  Procedure(s) Performed: FLEXIBLE SIGMOIDOSCOPY  Patient location during evaluation: Phase II Anesthesia Type: General Level of consciousness: awake Pain management: pain level controlled Vital Signs Assessment: post-procedure vital signs reviewed and stable Respiratory status: spontaneous breathing and respiratory function stable Cardiovascular status: blood pressure returned to baseline and stable Postop Assessment: no headache and no apparent nausea or vomiting Anesthetic complications: no Comments: Late entry   No notable events documented.   Last Vitals:  Vitals:   08/09/23 1107 08/09/23 1110  BP: (!) 87/50 111/75  Pulse:    Resp:    Temp:    SpO2:      Last Pain:  Vitals:   08/09/23 1104  TempSrc: Oral  PainSc: 0-No pain                 Windell Norfolk

## 2023-08-15 ENCOUNTER — Ambulatory Visit: Payer: Commercial Managed Care - PPO | Attending: Cardiology | Admitting: *Deleted

## 2023-08-15 ENCOUNTER — Encounter (HOSPITAL_COMMUNITY): Payer: Self-pay | Admitting: Internal Medicine

## 2023-08-15 DIAGNOSIS — I480 Paroxysmal atrial fibrillation: Secondary | ICD-10-CM

## 2023-08-15 DIAGNOSIS — Z5181 Encounter for therapeutic drug level monitoring: Secondary | ICD-10-CM

## 2023-08-15 LAB — POCT INR: INR: 1.9 — AB (ref 2.0–3.0)

## 2023-08-15 NOTE — Patient Instructions (Signed)
Take warfarin 1 1/2 tablets tonight then resume 1/2 tablet daily except 1 tablet on Tuesdays and Saturdays.  Stop Lovenox S/P colonoscopy on 08/09/23.  Procedure aborted due to poor prep. Pending hip surgery.  Date TBD

## 2023-08-18 ENCOUNTER — Other Ambulatory Visit: Payer: Self-pay | Admitting: Cardiology

## 2023-08-21 NOTE — Telephone Encounter (Signed)
Refill request for warfarin:  Last INR was 1.9 on 08/15/23 Next INR due 08/29/23 LOV was 07/28/23  Refill approved.

## 2023-08-23 ENCOUNTER — Encounter: Payer: Self-pay | Admitting: Orthopaedic Surgery

## 2023-08-23 ENCOUNTER — Ambulatory Visit: Payer: Commercial Managed Care - PPO | Admitting: Orthopaedic Surgery

## 2023-08-23 DIAGNOSIS — M1612 Unilateral primary osteoarthritis, left hip: Secondary | ICD-10-CM

## 2023-08-23 NOTE — Progress Notes (Signed)
HPI: [name] returns today to discuss left hip replacement.  He presents today with his wife and wants for her to see the x-rays of his hip.  He states that the left hip pain is keeping him from doing activities that he would like to be involved in but even activities of daily living including donning shoes and socks.  He has severe pain in the left hip at times.  Has had no new injury to the hip.  Radiographs of his left hip shows severe end-stage osteoarthritis left hip.  He is diabetic but reports good control of his diabetes.  Does have sleep apnea but does not use a CPAP machine.  He is also on chronic Coumadin and is followed by Dr. Diona Browner for A-fib, coronary artery disease and chronic systolic heart failure.  He does have an in situ cardio defibrillator implant.  Review of systems: He denies any chest pain, shortness of breath, fevers or chills.  Physical exam: General: Well-developed well-nourished male no acute distress mood and affect appropriate Psych: Alert and oriented x 3 Respirations: Unlabored Bilateral hips: Left hip virtually no internal or external rotation.  He has limited internal rotation of the right hip without pain.  Good external rotation of the right hip.  Impression: End-stage osteoarthritis left hip  Plan: Given the fact that his left hip arthritis is affecting his quality of life he would like to proceed with a left total hip arthroplasty.  He has reviewed the literature.  He does have questions today about hip surgery in regards to the perioperative and postoperative protocol.  Risk benefits of surgery were discussed with the patient at length.  Risk include but are not limited to leg length discrepancy, nerve vessel injury, blood loss, wound healing problems, infection, worsening pain and DVT/PE.  He will need cardiac clearance by Dr. Diona Browner and recommendations on anticoagulation.  Questions were encouraged and answered by Dr. Raye Sorrow myself.  Will work on scheduling  for near future for left total hip arthroplasty.

## 2023-08-29 ENCOUNTER — Ambulatory Visit: Payer: Commercial Managed Care - PPO | Attending: Cardiology | Admitting: *Deleted

## 2023-08-29 DIAGNOSIS — I6350 Cerebral infarction due to unspecified occlusion or stenosis of unspecified cerebral artery: Secondary | ICD-10-CM

## 2023-08-29 DIAGNOSIS — Z5181 Encounter for therapeutic drug level monitoring: Secondary | ICD-10-CM

## 2023-08-29 DIAGNOSIS — I4891 Unspecified atrial fibrillation: Secondary | ICD-10-CM

## 2023-08-29 LAB — POCT INR: INR: 2.1 (ref 2.0–3.0)

## 2023-08-29 NOTE — Patient Instructions (Signed)
Continue warfarin 1/2 tablet daily except 1 tablet on Tuesdays and Saturdays.  Pending hip surgery.  Date TBD  Will need to hold warfarin and bridge with Lovenox.

## 2023-09-04 ENCOUNTER — Ambulatory Visit (INDEPENDENT_AMBULATORY_CARE_PROVIDER_SITE_OTHER): Payer: BC Managed Care – PPO

## 2023-09-04 DIAGNOSIS — I255 Ischemic cardiomyopathy: Secondary | ICD-10-CM

## 2023-09-04 DIAGNOSIS — I5022 Chronic systolic (congestive) heart failure: Secondary | ICD-10-CM

## 2023-09-04 LAB — CUP PACEART REMOTE DEVICE CHECK
Battery Remaining Longevity: 49 mo
Battery Remaining Percentage: 47 %
Battery Voltage: 2.95 V
Brady Statistic RV Percent Paced: 1 %
Date Time Interrogation Session: 20241216063915
HighPow Impedance: 48 Ohm
HighPow Impedance: 48 Ohm
Implantable Lead Connection Status: 753985
Implantable Lead Implant Date: 20020111
Implantable Lead Location: 753860
Implantable Lead Model: 148
Implantable Lead Serial Number: 116740
Implantable Pulse Generator Implant Date: 20180503
Lead Channel Impedance Value: 650 Ohm
Lead Channel Pacing Threshold Amplitude: 1.25 V
Lead Channel Pacing Threshold Pulse Width: 0.5 ms
Lead Channel Sensing Intrinsic Amplitude: 12 mV
Lead Channel Setting Pacing Amplitude: 2.5 V
Lead Channel Setting Pacing Pulse Width: 0.5 ms
Lead Channel Setting Sensing Sensitivity: 0.5 mV
Pulse Gen Serial Number: 7421148

## 2023-09-07 ENCOUNTER — Telehealth: Payer: Self-pay | Admitting: Internal Medicine

## 2023-09-07 NOTE — Telephone Encounter (Signed)
noted 

## 2023-09-07 NOTE — Telephone Encounter (Signed)
Tobi Bastos says pt needs to have OV or can do virtual visit.

## 2023-09-07 NOTE — Telephone Encounter (Signed)
Pt had colonoscopy by RMR on 08/09/2023, RMR recommendations were to reschedule ASAP. Did you get this already?

## 2023-09-08 ENCOUNTER — Telehealth: Payer: Self-pay

## 2023-09-08 DIAGNOSIS — Z7901 Long term (current) use of anticoagulants: Secondary | ICD-10-CM

## 2023-09-08 NOTE — Telephone Encounter (Signed)
   Pre-operative Risk Assessment    Patient Name: [name]  DOB: 1951/04/11 MRN: [id number]    LOV: 07/28/23 - DR. MEALOR NOV: 07/26/24 [facility]   Request for Surgical Clearance    Procedure:   L TOTAL HIP ARTHROPLASTY  Date of Surgery:  Clearance TBD                                 Surgeon:  DR. Cristal Deer  Surgeon's Group or Practice Name:  [facility] [place] Phone number:  [phone] Fax number:  [phone]   Type of Clearance Requested:   - Pharmacy:  Hold Warfarin (Coumadin) NEEDS INSTRUCTIONS    Type of Anesthesia:  Spinal   Additional requests/questions:    Signed, Michaelle Copas   09/08/2023, 5:00 PM

## 2023-09-11 ENCOUNTER — Other Ambulatory Visit: Payer: Self-pay | Admitting: *Deleted

## 2023-09-11 ENCOUNTER — Ambulatory Visit: Payer: Commercial Managed Care - PPO | Attending: Cardiovascular Disease

## 2023-09-11 DIAGNOSIS — Z95810 Presence of automatic (implantable) cardiac defibrillator: Secondary | ICD-10-CM | POA: Diagnosis not present

## 2023-09-11 DIAGNOSIS — I5022 Chronic systolic (congestive) heart failure: Secondary | ICD-10-CM

## 2023-09-11 NOTE — Telephone Encounter (Signed)
Patient with diagnosis of afib on warfarin for anticoagulation.    Procedure:  L TOTAL HIP ARTHROPLASTY  Date of procedure: TBD   CHA2DS2-VASc Score = 6   This indicates a 9.7% annual risk of stroke. The patient's score is based upon: CHF History: 1 HTN History: 1 Diabetes History: 0 Stroke History: 2 Vascular Disease History: 1 Age Score: 1 Gender Score: 0      CrCl 67 ml/min Platelet count: Patient will need updated CBC prior to bridge  Per office protocol, patient can hold warfarin for 5 days prior to procedure.    Patient WILL need bridging with Lovenox (enoxaparin) around procedure.  **This guidance is not considered finalized until pre-operative APP has relayed final recommendations.**

## 2023-09-11 NOTE — Telephone Encounter (Signed)
S/w pt is aware only needs CBC for clearance. Pt will ask at coumadin appt on 09/26/23  when pt should get CBC drawn.  Order placed and released.

## 2023-09-11 NOTE — Telephone Encounter (Signed)
Preop team, please order CBC. He will need updated labs prior to making bridging recommendations. Thank you for your help.   Thomasene Ripple. Asna Muldrow NP-C     09/11/2023, 3:12 PM [facility] [address] Office [phone] Fax [phone]

## 2023-09-12 NOTE — Progress Notes (Signed)
EPIC Encounter for ICM Monitoring  Patient Name: [name] is a 72 y.o. male Date: 09/12/2023 Primary Care Physican: Donetta Potts, MD Primary Cardiologist: Diona Browner Electrophysiologist: Mealor 10/21/2022 Weight: 218-220 lbs 02/01/2023 Weight: 218 lbs 03/10/2023 Weight: 218 lbs 05/18/2023 Weight: 218 lbs 06/30/2023 Weight: 221 lbs 09/12/2023 Weight: 221 lbs     Spoke with patient and heart failure questions reviewed.  Transmission results reviewed.  Pt asymptomatic for fluid accumulation.  Reports feeling well at this time and voices no complaints.   He will be scheduling total hip replacement within the next 6 weeks.     [facility]vue Thoracic impedance suggesting possible fluid accumulation starting 12/21 and trending back to baseline.     Prescribed:  Spironolactone 25 mg take 0.5 tablet (12.5 mg total) daily. Jardiance 10 mg take 1 tablet by mouth before breakfast   Recommendations:   No changes and encouraged to call if experiencing any fluid symptoms.   Follow-up plan: ICM clinic phone appointment on 10/16/2023.   91 day device clinic remote transmission 12/04/2023.       EP/Cardiology Office visits:  07/26/2024 with Dr Nelly Laurence.   Recall 01/01/2024 with Dr Diona Browner.   Copy of ICM check sent to Dr. Nelly Laurence.   3 month ICM trend: 09/11/2023.    12-14 Month ICM trend:     Karie Soda, RN 09/12/2023 8:08 AM

## 2023-09-26 ENCOUNTER — Ambulatory Visit: Payer: Commercial Managed Care - PPO | Attending: Cardiology | Admitting: *Deleted

## 2023-09-26 DIAGNOSIS — I4891 Unspecified atrial fibrillation: Secondary | ICD-10-CM

## 2023-09-26 DIAGNOSIS — I6350 Cerebral infarction due to unspecified occlusion or stenosis of unspecified cerebral artery: Secondary | ICD-10-CM | POA: Diagnosis not present

## 2023-09-26 DIAGNOSIS — Z5181 Encounter for therapeutic drug level monitoring: Secondary | ICD-10-CM

## 2023-09-26 LAB — POCT INR: INR: 1.6 — AB (ref 2.0–3.0)

## 2023-09-26 NOTE — Patient Instructions (Signed)
 Take warfarin 1 1/2 tablets tonight, 1 tablet tomorrow night then resume 1/2 tablet daily except 1 tablet on Tuesdays and Saturdays.  Pending hip surgery.  Date TBD  Will need to hold warfarin and bridge with Lovenox. Pt will call me with surgical date.

## 2023-09-27 NOTE — Telephone Encounter (Signed)
 Spoke with pt, he misunderstood and thought Misty Stanley told him it wasn't a rush.  I did explain that we needed the CBC in order to give him clearance.  Per pt, he will get it tomorrow.

## 2023-09-27 NOTE — Telephone Encounter (Signed)
 Helping in preop today. Does not appear patient had CBC done yesterday. Will route to callback to help make sure patient knows to obtain. Then anticipate VV.

## 2023-09-28 LAB — CBC
Hematocrit: 49.4 % (ref 37.5–51.0)
Hemoglobin: 16.2 g/dL (ref 13.0–17.7)
MCH: 31.3 pg (ref 26.6–33.0)
MCHC: 32.8 g/dL (ref 31.5–35.7)
MCV: 96 fL (ref 79–97)
Platelets: 177 10*3/uL (ref 150–450)
RBC: 5.17 x10E6/uL (ref 4.14–5.80)
RDW: 12.4 % (ref 11.6–15.4)
WBC: 7.9 10*3/uL (ref 3.4–10.8)

## 2023-09-29 ENCOUNTER — Telehealth: Payer: Self-pay | Admitting: *Deleted

## 2023-09-29 NOTE — Telephone Encounter (Signed)
 CORRECTION ON SURGEON NAME:  DR. Doneen Poisson   Left message today to call back to schedule tele preop appt.

## 2023-09-29 NOTE — Telephone Encounter (Signed)
   Name: [name]  DOB: 1950/11/04  MRN: [id number]  Primary Cardiologist: Jayson Sierras, MD  Chart reviewed as part of pre-operative protocol coverage. Because of [name]'s past medical history and time since last visit, he will require a follow-up telephone visit in order to better assess preoperative cardiovascular risk.  Pre-op covering staff: - Please schedule appointment and call patient to inform them. If patient already had an upcoming appointment within acceptable timeframe, please add pre-op clearance to the appointment notes so provider is aware. - Please contact requesting surgeon's office via preferred method (i.e, phone, fax) to inform them of need for appointment prior to surgery.  Per office protocol, patient can hold warfarin for 5 days prior to procedure.     Patient WILL need bridging with Lovenox  (enoxaparin ) around procedure.  CBC WNL   Marcus LOISE Fabry, PA-C  09/29/2023, 9:10 AM

## 2023-09-29 NOTE — Telephone Encounter (Signed)
 Pt called back and has been scheduled his tele pre op appt 10/11/23 @ 1:40. Pt will see coumadin  clinic in the AM for Warfarin hold and set up of Lovenox  bridging.    I will update Dr. Vernetta as [name].       Patient Consent for Virtual Visit        [name] has provided verbal consent on 09/29/2023 for a virtual visit (video or telephone).   CONSENT FOR VIRTUAL VISIT FOR:  [name]  By participating in this virtual visit I agree to the following:  I hereby voluntarily request, consent and authorize [facility] and its employed or contracted physicians, physician assistants, nurse practitioners or other licensed health care professionals (the Practitioner), to provide me with telemedicine health care services (the "Services) as deemed necessary by the treating Practitioner. I acknowledge and consent to receive the Services by the Practitioner via telemedicine. I understand that the telemedicine visit will involve communicating with the Practitioner through live audiovisual communication technology and the disclosure of certain medical information by electronic transmission. I acknowledge that I have been given the opportunity to request an in-person assessment or other available alternative prior to the telemedicine visit and am voluntarily participating in the telemedicine visit.  I understand that I have the right to withhold or withdraw my consent to the use of telemedicine in the course of my care at any time, without affecting my right to future care or treatment, and that the Practitioner or I may terminate the telemedicine visit at any time. I understand that I have the right to inspect all information obtained and/or recorded in the course of the telemedicine visit and may receive copies of available information for a reasonable fee.  I understand that some of the potential risks of receiving the Services via telemedicine include:  Delay or interruption in medical  evaluation due to technological equipment failure or disruption; Information transmitted may not be sufficient (e.g. poor resolution of images) to allow for appropriate medical decision making by the Practitioner; and/or  In rare instances, security protocols could fail, causing a breach of personal health information.  Furthermore, I acknowledge that it is my responsibility to provide information about my medical history, conditions and care that is complete and accurate to the best of my ability. I acknowledge that Practitioner's advice, recommendations, and/or decision may be based on factors not within their control, such as incomplete or inaccurate data provided by me or distortions of diagnostic images or specimens that may result from electronic transmissions. I understand that the practice of medicine is not an exact science and that Practitioner makes no warranties or guarantees regarding treatment outcomes. I acknowledge that a copy of this consent can be made available to me via my patient portal [facility] MyChart), or I can request a printed copy by calling the office of [facility].    I understand that my insurance will be billed for this visit.   I have read or had this consent read to me. I understand the contents of this consent, which adequately explains the benefits and risks of the Services being provided via telemedicine.  I have been provided ample opportunity to ask questions regarding this consent and the Services and have had my questions answered to my satisfaction. I give my informed consent for the services to be provided through the use of telemedicine in my medical care

## 2023-09-29 NOTE — Telephone Encounter (Signed)
 Pt called back and has been scheduled his tele pre op appt 10/11/23 @ 1:40. Pt will see coumadin clinic in the AM for Warfarin hold and set up of Lovenox bridging.   I will update Dr. Magnus Ivan as Lorain Childes.

## 2023-10-03 ENCOUNTER — Encounter: Payer: Self-pay | Admitting: Gastroenterology

## 2023-10-03 ENCOUNTER — Ambulatory Visit: Payer: Commercial Managed Care - PPO | Admitting: Gastroenterology

## 2023-10-03 VITALS — BP 94/60 | HR 56 | Temp 98.4°F | Ht 73.0 in | Wt 228.8 lb

## 2023-10-03 DIAGNOSIS — Z860101 Personal history of adenomatous and serrated colon polyps: Secondary | ICD-10-CM | POA: Diagnosis not present

## 2023-10-03 DIAGNOSIS — Z09 Encounter for follow-up examination after completed treatment for conditions other than malignant neoplasm: Secondary | ICD-10-CM | POA: Diagnosis not present

## 2023-10-03 DIAGNOSIS — Z860100 Personal history of colon polyps, unspecified: Secondary | ICD-10-CM

## 2023-10-03 NOTE — Patient Instructions (Signed)
 Let's try Benefiber 2 teaspoons each day in beverage of your choice.  You can take dicyclomine  as needed.   Please message when you know the surgery date, and then we can plan ahead for the colonoscopy!  We will do a different prep next time and an extra 1/2 day of clear liquids.   Further recommendations to follow!   I enjoyed seeing you again today! I value our relationship and want to provide genuine, compassionate, and quality care. You may receive a survey regarding your visit with me, and I welcome your feedback! Thanks so much for taking the time to complete this. I look forward to seeing you again.      Therisa MICAEL Stager, PhD, ANP-BC [facility]

## 2023-10-03 NOTE — Progress Notes (Signed)
 Gastroenterology Office Note     Primary Care Physician:  Trudy Vaughn FALCON, MD  Primary Gastroenterologist:   Chief Complaint   Chief Complaint  Patient presents with   Follow-up    Follow up to reschedule Colonoscopy     History of Present Illness   [name] is a 73 y.o. male presenting today with a history of postprandial formed stool with urgency, recommended to take fiber, with  colonoscopy completed in Aug 2021 with scattered medium-mouthed diverticula in sigmoid, three sessile polyps in hepatic flexure and cecum. 5-7 mm in size. Tubular adenomas. Benign colonic mucosa. 3 year surveillance due now. Celiac serologies negative in 2022.   Unfortunately, recent attempt at surveillance colonoscopy was unsuccessful due to poor prep. He completed the prep as outlined, following strictly. He was not taking any dicyclomine  during this time, as he has only taken sparingly when going out of town. He used Suprep for last procedure. In 2021, he used Clenpiq .   Needs hip surgery. Meeting with Olam to schedule a day for Lovenox  bridging. Hip surgery upcoming. Dr. Vernetta. Would like to do hip surgery first.    Has only taking dicyclomine  sparingly when going out of town. Trying to eat high fiber diet. Has not been taking supplemental fiber. Usually will have urgency in the morning postprandially, then tapers throughout the day.        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 s/p BMS to LAD and OM1 b. 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HCC)    Ischemic cardiomyopathy    LVEF 30%, status post ICD   OSA (obstructive  sleep apnea)    unable to tolerate CPAP   Paroxysmal atrial fibrillation (HCC)    On coumadin    Pseudoaneurysm (HCC)    SCC (squamous cell carcinoma) 08/04/2006   right shoulder (CX35FU)   SCC (squamous cell carcinoma) well differentiated 12/13/2011   right temple   Squamous cell carcinoma in situ (SCCIS) 08/04/2006   right sholder   Squamous cell carcinoma of skin 11/09/2020   in situ- left upper back   Squamous cell carcinoma of skin 11/09/2020   KA-right dorsal hand   Stroke [facility])     Past Surgical History:  Procedure Laterality Date   AICD implantation  2011   St Jude ICD implanted for primary prevention of sudden death   BIOPSY  04/29/20   Procedure: BIOPSY;  Surgeon: Shaaron Lamar HERO, MD;  [facility];  Service: Endoscopy;;   CATARACT EXTRACTION W/PHACO Left 03/27/2017   Procedure: CATARACT EXTRACTION PHACO AND INTRAOCULAR LENS PLACEMENT (IOC);  Surgeon: Perley Hamilton, MD;  Location: [facility] ORS;  Service: Ophthalmology;  Laterality: Left;  CDE: 8.48   CATARACT EXTRACTION W/PHACO Right 05/29/2017   Procedure: CATARACT EXTRACTION PHACO AND INTRAOCULAR LENS PLACEMENT (IOC);  Surgeon: Perley Hamilton, MD;  Location: [facility] ORS;  Service: Ophthalmology;  Laterality: Right;  CDE: 7.48   COLONOSCOPY N/A 04/29/2020   Scattered medium-mouthed diverticula in sigmoid, three sessile polyps in hepatic flexure and cecum. 5-7 mm in size. Tubular adenomas. Benign colonic mucosa. 3 year surveillance.  EYE SURGERY     FLEXIBLE SIGMOIDOSCOPY N/A 08/09/2023   Procedure: FLEXIBLE SIGMOIDOSCOPY;  Surgeon: Shaaron Lamar HERO, MD;  [facility];  Service: Endoscopy;  Laterality: N/A;  10:15AM, ASA 3   ICD GENERATOR CHANGEOUT N/A 01/19/2017   SJM Ellipse VR ICD generator change by Dr Kelsie   LAPAROSCOPY N/A 10/23/2017   Procedure: LAPAROSCOPY DIAGNOSTIC;  Surgeon: Ebbie Cough, MD;  Location: [facility] OR;  Service: General;  Laterality: N/A;   LEFT HEART CATH AND CORONARY ANGIOGRAPHY N/A 06/21/2018    Procedure: LEFT HEART CATH AND CORONARY ANGIOGRAPHY;  Surgeon: Anner Alm ORN, MD;  Location: [facility] INVASIVE CV LAB;  Service: Cardiovascular;  Laterality: N/A;   POLYPECTOMY  04/21/2020   Procedure: POLYPECTOMY;  Surgeon: Shaaron Lamar HERO, MD;  [facility];  Service: Endoscopy;;   VENTRAL HERNIA REPAIR N/A 10/23/2017   Procedure: VENTRAL HERNIA REPAIR;  Surgeon: Ebbie Cough, MD;  Location: [facility] OR;  Service: General;  Laterality: N/A;    Current Outpatient Medications  Medication Sig Dispense Refill   aspirin  81 MG tablet Take 81 mg by mouth daily.     bisoprolol  (ZEBETA ) 5 MG tablet TAKE 1 TABLET (5 MG TOTAL) BY MOUTH DAILY. 90 tablet 2   dicyclomine  (BENTYL ) 10 MG capsule TAKE 1 CAPSULE (10 MG TOTAL) BY MOUTH 3 (THREE) TIMES DAILY BEFORE MEALS. FOR FREQUENT STOOLS. 270 capsule 1   JARDIANCE  10 MG TABS tablet TAKE 1 TABLET BY MOUTH EVERY DAY BEFORE BREAKFAST 30 tablet 11   nitroGLYCERIN  (NITROSTAT ) 0.4 MG SL tablet Place 0.4 mg under the tongue every 5 (five) minutes x 3 doses as needed for chest pain (dissolve under tongue if no relief after 3rd dose proceed to ED or call 911).     rosuvastatin  (CRESTOR ) 20 MG tablet Take 1 tablet (20 mg total) by mouth daily. 90 tablet 2   sacubitril -valsartan  (ENTRESTO ) 24-26 MG Take 1 tablet by mouth 2 (two) times daily. 180 tablet 3   spironolactone  (ALDACTONE ) 25 MG tablet TAKE 1/2 TABLET BY MOUTH EVERY DAY 45 tablet 1   warfarin (COUMADIN ) 5 MG tablet TAKE 1/2 TABLET DAILY EXCEPT 1 TABLET ON TUESDAYS AND SATURDAYS OR AS DIRECTED 90 tablet 3   No current facility-administered medications for this visit.    Allergies as of 10/03/2023   (No Known Allergies)    Family History  Problem Relation Age of Onset   Cancer Mother        lymphoma    Heart attack Father    Heart disease Brother    Colon cancer Neg Hx    Colon polyps Neg Hx     Social History   Socioeconomic History   Marital status: Married    Spouse name: Not on file    Number of children: Not on file   Years of education: Not on file   Highest education level: Not on file  Occupational History   Occupation: retired    Comment: Banking  Tobacco Use   Smoking status: Never    Passive exposure: Never   Smokeless tobacco: Never  Vaping Use   Vaping status: Never Used  Substance and Sexual Activity   Alcohol  use: Yes    Comment: occasional moderate   Drug use: No   Sexual activity: Yes  Other Topics Concern   Not on file  Social History Narrative   Not on file   Social Drivers of Health   Financial Resource Strain: Not on file  Food Insecurity: Not on  file  Transportation Needs: Not on file  Physical Activity: Not on file  Stress: Not on file  Social Connections: Not on file  Intimate Partner Violence: Not on file     Review of Systems   Gen: Denies any fever, chills, fatigue, weight loss, lack of appetite.  CV: Denies chest pain, heart palpitations, peripheral edema, syncope.  Resp: Denies shortness of breath at rest or with exertion. Denies wheezing or cough.  GI: Denies dysphagia or odynophagia. Denies jaundice, hematemesis, fecal incontinence. GU : Denies urinary burning, urinary frequency, urinary hesitancy MS: Denies joint pain, muscle weakness, cramps, or limitation of movement.  Derm: Denies rash, itching, dry skin Psych: Denies depression, anxiety, memory loss, and confusion Heme: Denies bruising, bleeding, and enlarged lymph nodes.   Physical Exam   BP 94/60   Pulse (!) 56   Temp 98.4 F (36.9 C)   Ht 6' 1 (1.854 m)   Wt 228 lb 12.8 oz (103.8 kg)   BMI 30.19 kg/m  General:   Alert and oriented. Pleasant and cooperative. Well-nourished and well-developed.  Head:  Normocephalic and atraumatic. Eyes:  Without icterus Abdomen:  +BS, soft, non-tender and non-distended. No HSM noted. No guarding or rebound. No masses appreciated.  Rectal:  Deferred  Msk:  Symmetrical without gross deformities. Normal  posture. Extremities:  Without edema. Neurologic:  Alert and  oriented x4;  grossly normal neurologically. Skin:  Intact without significant lesions or rashes. Psych:  Alert and cooperative. Normal mood and affect.   Assessment   [name] is a 73 y.o. male presenting today with a history of postprandial urgency, adenomas with surveillance due now and recent colonoscopy incomplete due to stool remaining in colon.  Interestingly, he notes that he completed the prep as outlined and followed strictly. I query if he has more of an overflow urgency with underlying constipation. We have done a prior evaluation as noted in past visits, and I have asked him to start incorporating supplemental fiber in his diet. He takes dicyclomine  extremely sparingly and only if traveling.   Colonoscopy will need to be rescheduled, but he has an upcoming hip surgery that needs to be accomplished first. He will reach out to us  once he has this scheduled.   He will need to hold Coumadin  and have lovenox  bridging; we appreciate Olam Reid's (RN) assistance with this from the Coumadin  clinic.      PLAN    Add supplemental fiber daily Sparingly dicyclomine  Message when surgery date is scheduled Will need to arrange colonoscopy once recovered from orthopedic procedure Will need to hold coumadin  prior and lovenox  bridge Will trial different prep at next colonoscopy with an extra half day of clear liquids   Therisa MICAEL Stager, PhD, ANP-BC [facility]

## 2023-10-10 NOTE — Progress Notes (Signed)
Remote ICD transmission.   

## 2023-10-11 ENCOUNTER — Ambulatory Visit (INDEPENDENT_AMBULATORY_CARE_PROVIDER_SITE_OTHER): Payer: Commercial Managed Care - PPO

## 2023-10-11 ENCOUNTER — Ambulatory Visit: Payer: Commercial Managed Care - PPO | Attending: Cardiology | Admitting: *Deleted

## 2023-10-11 DIAGNOSIS — I6350 Cerebral infarction due to unspecified occlusion or stenosis of unspecified cerebral artery: Secondary | ICD-10-CM

## 2023-10-11 DIAGNOSIS — Z5181 Encounter for therapeutic drug level monitoring: Secondary | ICD-10-CM

## 2023-10-11 DIAGNOSIS — Z01810 Encounter for preprocedural cardiovascular examination: Secondary | ICD-10-CM | POA: Diagnosis not present

## 2023-10-11 DIAGNOSIS — I4891 Unspecified atrial fibrillation: Secondary | ICD-10-CM

## 2023-10-11 LAB — POCT INR: INR: 2.4 (ref 2.0–3.0)

## 2023-10-11 NOTE — Patient Instructions (Signed)
Continue warfarin 1/2 tablet daily except 1 tablet on Tuesdays and Saturdays.  Pending hip surgery.  Date TBD  Will need to hold warfarin and bridge with Lovenox. Pt will call me with surgical date.

## 2023-10-11 NOTE — Progress Notes (Signed)
Virtual Visit via Telephone Note   Because of [name]'s co-morbid illnesses, he is at least at moderate risk for complications without adequate follow up.  This format is felt to be most appropriate for this patient at this time.  The patient did not have access to video technology/had technical difficulties with video requiring transitioning to audio format only (telephone).  All issues noted in this document were discussed and addressed.  No physical exam could be performed with this format.  Please refer to the patient's chart for his consent to telehealth for [facility].  Evaluation Performed:  Preoperative cardiovascular risk assessment _____________   Date:  10/11/2023   Patient ID:  [name], DOB [birth year], MRN [id number] Patient Location:  Home Provider location:   Office  Primary Care Provider:  Donetta Potts, MD Primary Cardiologist:  Nona Dell, MD  Chief Complaint / Patient Profile   73 y.o. y/o male with a h/o CAD s/p BMS to LAD and OM1, HFrEF with [facility] Jude ICD for primary prevention, paroxysmal AF, HLD, OSA 2002 who is pending left total hip arthroplasty and presents today for telephonic preoperative cardiovascular risk assessment.  History of Present Illness    [name] is a 73 y.o. male who presents via audio/video conferencing for a telehealth visit today.  Pt was last seen in cardiology clinic on 07/05/2023 by Diona Browner.  At that time [name] was doing well with no complaints of angina or CHF symptoms were stable.  The patient is now pending procedure as outlined above. Since his last visit, he has been doing well with no new cardiac complaints.  He has been staying active but is limited with his hip pain especially with the colder weather.  He denies chest pain, shortness of breath, lower extremity edema, fatigue, palpitations, melena, hematuria, hemoptysis, diaphoresis, weakness, presyncope, syncope, orthopnea,  and PND.    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 s/p BMS to LAD and OM1 b. 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   right temple   Squamous cell carcinoma in situ (SCCIS) 08/04/2006   right sholder   Squamous cell carcinoma of skin 11/09/2020   in situ- left upper back   Squamous cell carcinoma of skin 11/09/2020   KA-right dorsal hand   Stroke [facility])    Past Surgical History:  Procedure Laterality Date   AICD implantation  2011   St Jude ICD implanted for primary prevention of sudden death   BIOPSY  19-May-2020   Procedure: BIOPSY;  Surgeon: Corbin Ade, MD;  [facility];  Service: Endoscopy;;   CATARACT EXTRACTION W/PHACO Left 03/27/2017   Procedure: CATARACT EXTRACTION PHACO AND INTRAOCULAR LENS  PLACEMENT (IOC);  Surgeon: Gemma Payor, MD;  Location: [facility] ORS;  Service: Ophthalmology;  Laterality: Left;  CDE: 8.48   CATARACT EXTRACTION W/PHACO Right 05/29/2017   Procedure: CATARACT EXTRACTION PHACO AND INTRAOCULAR LENS PLACEMENT (IOC);  Surgeon: Gemma Payor, MD;  Location: [facility] ORS;  Service: Ophthalmology;  Laterality: Right;  CDE: 7.48   COLONOSCOPY N/A 04/21/2020   Scattered medium-mouthed diverticula in sigmoid, three sessile polyps in hepatic flexure and cecum. 5-7 mm  in size. Tubular adenomas. Benign colonic mucosa. 3 year surveillance.    EYE SURGERY     FLEXIBLE SIGMOIDOSCOPY N/A 08/09/2023   Procedure: FLEXIBLE SIGMOIDOSCOPY;  Surgeon: Corbin Ade, MD;  [facility];  Service: Endoscopy;  Laterality: N/A;  10:15AM, ASA 3   ICD GENERATOR CHANGEOUT N/A 01/19/2017   SJM Ellipse VR ICD generator change by Dr Johney Frame   LAPAROSCOPY N/A 10/23/2017   Procedure: LAPAROSCOPY DIAGNOSTIC;  Surgeon: Emelia Loron, MD;  Location: [facility] OR;  Service: General;  Laterality: N/A;   LEFT HEART CATH AND CORONARY ANGIOGRAPHY N/A 06/21/2018   Procedure: LEFT HEART CATH AND CORONARY ANGIOGRAPHY;  Surgeon: Marykay Lex, MD;  Location: [facility] INVASIVE CV LAB;  Service: Cardiovascular;  Laterality: N/A;   POLYPECTOMY  04/21/2020   Procedure: POLYPECTOMY;  Surgeon: Corbin Ade, MD;  [facility];  Service: Endoscopy;;   VENTRAL HERNIA REPAIR N/A 10/23/2017   Procedure: VENTRAL HERNIA REPAIR;  Surgeon: Emelia Loron, MD;  Location: [facility] OR;  Service: General;  Laterality: N/A;    Allergies  No Known Allergies  Home Medications    Prior to Admission medications   Medication Sig Start Date End Date Taking? Authorizing Provider  aspirin 81 MG tablet Take 81 mg by mouth daily.    [provider]  bisoprolol (ZEBETA) 5 MG tablet TAKE 1 TABLET (5 MG TOTAL) BY MOUTH DAILY. 03/06/23   Antoine Poche, MD  dicyclomine (BENTYL) 10 MG capsule TAKE 1 CAPSULE (10 MG TOTAL) BY MOUTH 3 (THREE) TIMES DAILY BEFORE MEALS. FOR FREQUENT STOOLS. 07/10/23   Gelene Mink, NP  JARDIANCE 10 MG TABS tablet TAKE 1 TABLET BY MOUTH EVERY DAY BEFORE BREAKFAST 06/12/23   Jonelle Sidle, MD  nitroGLYCERIN (NITROSTAT) 0.4 MG SL tablet Place 0.4 mg under the tongue every 5 (five) minutes x 3 doses as needed for chest pain (dissolve under tongue if no relief after 3rd dose proceed to ED or call 911).    [provider]  rosuvastatin (CRESTOR) 20 MG tablet Take  1 tablet (20 mg total) by mouth daily. 07/05/23   Jonelle Sidle, MD  sacubitril-valsartan (ENTRESTO) 24-26 MG Take 1 tablet by mouth 2 (two) times daily. 07/11/23   Jonelle Sidle, MD  spironolactone (ALDACTONE) 25 MG tablet TAKE 1/2 TABLET BY MOUTH EVERY DAY 07/03/23   Jonelle Sidle, MD  warfarin (COUMADIN) 5 MG tablet TAKE 1/2 TABLET DAILY EXCEPT 1 TABLET ON TUESDAYS AND SATURDAYS OR AS DIRECTED 08/21/23   Jonelle Sidle, MD    Physical Exam    Vital Signs:  [name] does not have vital signs available for review today.  Given telephonic nature of communication, physical exam is limited. AAOx3. NAD. Normal affect.  Speech and respirations are unlabored.  Accessory Clinical Findings    None  Assessment & Plan    1.  Preoperative Cardiovascular Risk Assessment: -Patient's RCRI score is 11%  The patient affirms he has been doing well without any new cardiac symptoms. They  are able to achieve 6 METS without cardiac limitations. Therefore, based on ACC/AHA guidelines, the patient would be at acceptable risk for the planned procedure without further cardiovascular testing. The patient was advised that if he develops new symptoms prior to surgery to contact our office to arrange for a follow-up visit, and he verbalized understanding.   The patient was advised that if he develops new symptoms prior to surgery to contact our office to arrange for a follow-up visit, and he verbalized understanding.  Per office protocol, patient can hold warfarin for 5 days prior to procedure.  Patient will need bridging with Lovenox  A copy of this note will be routed to requesting surgeon.  Time:   Today, I have spent 10 minutes with the patient with telehealth technology discussing medical history, symptoms, and management plan.     Napoleon Form, Leodis Rains, NP  10/11/2023, 7:45 AM

## 2023-10-16 ENCOUNTER — Ambulatory Visit: Payer: Commercial Managed Care - PPO | Attending: Cardiovascular Disease

## 2023-10-16 DIAGNOSIS — I5022 Chronic systolic (congestive) heart failure: Secondary | ICD-10-CM

## 2023-10-16 DIAGNOSIS — Z95810 Presence of automatic (implantable) cardiac defibrillator: Secondary | ICD-10-CM | POA: Diagnosis not present

## 2023-10-20 ENCOUNTER — Telehealth: Payer: Self-pay | Admitting: Cardiology

## 2023-10-20 NOTE — Telephone Encounter (Signed)
Patient called to report the date for his surgery is Friday, 11/24/23.  Patient wants a call back from RN Misty Stanley to confirm when he will have his Lovamax bridge.

## 2023-10-20 NOTE — Progress Notes (Signed)
EPIC Encounter for ICM Monitoring  Patient Name: [name] is a 73 y.o. male Date: 10/20/2023 Primary Care Physican: Donetta Potts, MD Primary Cardiologist: Diona Browner Electrophysiologist: Mealor 10/21/2022 Weight: 218-220 lbs 02/01/2023 Weight: 218 lbs 03/10/2023 Weight: 218 lbs 05/18/2023 Weight: 218 lbs 06/30/2023 Weight: 221 lbs 09/12/2023 Weight: 221 lbs    Spoke with patient and heart failure questions reviewed.  Transmission results reviewed.  Pt asymptomatic for fluid accumulation.  Reports feeling well at this time and voices no complaints.   He will be having total hip replacement on 3/7.     [facility] Thoracic impedance suggesting normal fluid levels since 10/01/2023.     Prescribed:  Spironolactone 25 mg take 0.5 tablet (12.5 mg total) daily. Jardiance 10 mg take 1 tablet by mouth before breakfast   Recommendations:   No changes and encouraged to call if experiencing any fluid symptoms.   Follow-up plan: ICM clinic phone appointment on 11/20/2023.   91 day device clinic remote transmission 12/04/2023.       EP/Cardiology Office visits:  07/26/2024 with Dr Nelly Laurence.   Recall 01/01/2024 with Dr Diona Browner.   Copy of ICM check sent to Dr. Nelly Laurence.   3 month ICM trend: 10/16/2023.    12-14 Month ICM trend:     Karie Soda, RN 10/20/2023 2:37 PM

## 2023-10-23 NOTE — Telephone Encounter (Signed)
Called and spoke with patient.  He has I INR appt on 11/01/23.  Told him we would get evrything arrange for his Lovenox bridge when I see him on 11/01/23.  He verbalized understanding.

## 2023-11-01 ENCOUNTER — Ambulatory Visit: Payer: Commercial Managed Care - PPO | Attending: Cardiology | Admitting: *Deleted

## 2023-11-01 DIAGNOSIS — Z5181 Encounter for therapeutic drug level monitoring: Secondary | ICD-10-CM

## 2023-11-01 DIAGNOSIS — I6350 Cerebral infarction due to unspecified occlusion or stenosis of unspecified cerebral artery: Secondary | ICD-10-CM | POA: Diagnosis not present

## 2023-11-01 DIAGNOSIS — I4891 Unspecified atrial fibrillation: Secondary | ICD-10-CM | POA: Diagnosis not present

## 2023-11-01 LAB — POCT INR: INR: 1.7 — AB (ref 2.0–3.0)

## 2023-11-01 MED ORDER — ENOXAPARIN SODIUM 150 MG/ML IJ SOSY: 150.0000 mg | PREFILLED_SYRINGE | INTRAMUSCULAR | 0 refills | Status: DC

## 2023-11-01 NOTE — Patient Instructions (Signed)
Take warfarin 1 tablet tonight and tomorrow night then resume 1/2 tablet daily except 1 tablet on Tuesdays and Saturdays.  Pending hip surgery 3/7  Will need to hold warfarin and bridge with Lovenox. Recheck INR on 2/27

## 2023-11-07 NOTE — Progress Notes (Deleted)
 Sent message, via epic in basket, requesting orders in epic from Careers adviser.

## 2023-11-13 ENCOUNTER — Encounter: Payer: Self-pay | Admitting: Cardiovascular Disease

## 2023-11-13 NOTE — Progress Notes (Signed)
 COVID Vaccine received:  []  No [x]  Yes Date of any COVID positive Test in last 90 days: none  PCP - Mitzi Hansen, MD  Cardiologist - Nona Dell, MD , Robin Searing NP  cardiac clearance in 10-11-23 Epic note EP- York Pellant, MD  Chest x-ray - 01-03-2019  2v  Epic EKG - 07-05-2023  Epic  Stress Test -  ECHO - 06-22-2022  Epic Cardiac Cath - 06-21-2018  LHC/ Cors  BMS x 2  Dr. Herbie Baltimore  PCR screen: []  Ordered & Completed []   No Order but Needs PROFEND     []   N/A for this surgery  Surgery Plan:  []  Ambulatory   [x]  Outpatient in bed  []  Admit Anesthesia:    []  General  []  Spinal  [x]   Choice []   MAC  Pacemaker / ICD device []  No [x]  Yes  St Jude Ellipse VR placed 01-19-2017  device orders in [place],  [place] notified of case 11-14-23.  Jessica copied on IB msg.  Spinal Cord Stimulator:[x]  No []  Yes       History of Sleep Apnea? []  No [x]  Yes   mild OSA CPAP used?- [x]  No []  Yes    Does the patient monitor blood sugar?   [x]  N/A   []  No []  Yes  Patient has: [x]  NO Hx DM   []  Pre-DM   []  DM1  []   DM2 SGLT-2 inhibitors / usual dose - Take Jardiance d/t CHF. Will hold x 72 hours  Blood Thinner / Instructions: Coumadin- hold after 11-18-23, will be on Lovenox bridge as per [facility] Vashti Hey, RN) Aspirin Instructions:  ASA 81mg -  will ask [facility]- Vashti Hey, RN on Thursday 11-16-23  ERAS Protocol Ordered: []  No  [x]  Yes PRE-SURGERY [x]  ENSURE  []  G2  Patient is to be NPO after:   Dental hx: []  Dentures:  [x]  N/A      []  Bridge or Partial:                   []  Loose or Damaged teeth:   Comments: Patient was given the 5 CHG shower / bath instructions for THA surgery along with 2 bottles of the CHG soap. Patient will start this on: 11-20-2023 All questions were asked and answered, Patient voiced understanding of this process.   Activity level: Patient is able to climb a flight of stairs without difficulty; [x]  No CP  [x]  No SOB, but would have leg pain.  Patient can perform  ADLs without assistance.   Anesthesia review: CHF, has ICD- last check 09-04-23, requested device orders, HTN, Hx CVA, A.fib, OSA-  no CPAP.    Patient denies shortness of breath, fever, cough and chest pain at PAT appointment.  Patient verbalized understanding and agreement to the Pre-Surgical Instructions that were given to them at this PAT appointment. Patient was also educated of the need to review these PAT instructions again prior to his surgery.I reviewed the appropriate phone numbers to call if they have any and questions or concerns.

## 2023-11-13 NOTE — Progress Notes (Signed)
 PERIOPERATIVE PRESCRIPTION FOR IMPLANTED CARDIAC DEVICE PROGRAMMING  Patient Information: Name:  [name]  DOB:  June 15, 1951  MRN:  [id number]    Planned Procedure:  Left anterior Total hip arthroplasty  Surgeon: Dr. Allie Bossier  Date of Procedure: 11-24-2023  Cautery will be used.  Position during surgery: Supine   Please send documentation back to:  [facility] (Fax# [phone]) or Respond to the IB message.  Let me know if the Company Rep. Needs to be contacted.  Device Information:  Clinic EP Physician:  Dr. York Pellant   Device Type:  Defibrillator Manufacturer and Phone #:  St. Jude/Abbott: [phone] Pacemaker Dependent?:  No. Date of Last Device Check:  10/23/23  Normal Device Function?:  Yes.    Electrophysiologist's Recommendations:  Have magnet available. Provide continuous ECG monitoring when magnet is used or reprogramming is to be performed.  Procedure should not interfere with device function.  No device programming or magnet placement needed.  Per Device Clinic Standing Orders, Lenor Coffin, RN  1:58 PM 11/13/2023 PERIOPERATIVE PRESCRIPTION FOR IMPLANTED CARDIAC DEVICE PROGRAMMING

## 2023-11-13 NOTE — Patient Instructions (Signed)
 SURGICAL WAITING ROOM VISITATION Patients having surgery or a procedure may have no more than 2 support people in the waiting area - these visitors may rotate in the visitor waiting room.   Due to an increase in RSV and influenza rates and associated hospitalizations, children ages 53 and under may not visit patients in [facility] hospitals. If the patient needs to stay at the hospital during part of their recovery, the visitor guidelines for inpatient rooms apply.  PRE-OP VISITATION  Pre-op nurse will coordinate an appropriate time for 1 support person to accompany the patient in pre-op.  This support person may not rotate.  This visitor will be contacted when the time is appropriate for the visitor to come back in the pre-op area.  Please refer to the [facility] website for the visitor guidelines for Inpatients (after your surgery is over and you are in a regular room).  You are not required to quarantine at this time prior to your surgery. However, you must do this: Hand Hygiene often Do NOT share personal items Notify your provider if you are in close contact with someone who has COVID or you develop fever 100.4 or greater, new onset of sneezing, cough, sore throat, shortness of breath or body aches.  If you test positive for Covid or have been in contact with anyone that has tested positive in the last 10 days please notify you surgeon.    Your procedure is scheduled on:  Friday  November 24, 2023  Report to [facility] Main Entrance: [facility] entrance where the [employer] is available.   Report to admitting at: 07:30 AM  Call this number if you have any questions or problems the morning of surgery [phone]  Do not eat food after Midnight the night prior to your surgery/procedure.  After Midnight you may have the following liquids until  07:00 AM DAY OF SURGERY  Clear Liquid Diet Water Black Coffee (sugar ok, NO MILK/CREAM OR CREAMERS)  Tea (sugar ok, NO MILK/CREAM  OR CREAMERS) regular and decaf                             Plain Jell-O  with no fruit (NO RED)                                           Fruit ices (not with fruit pulp, NO RED)                                     Popsicles (NO RED)                                                                  Juice: NO CITRUS JUICES: only apple, WHITE grape, WHITE cranberry Sports drinks like Gatorade or Powerade (NO RED)                   The day of surgery:  Drink ONE (1) Pre-Surgery Clear Ensure at  07:00  AM the morning of surgery. Drink in one  sitting. Do not sip.  This drink was given to you during your hospital pre-op appointment visit. Nothing else to drink after completing the Pre-Surgery Clear Ensure : No candy, chewing gum or throat lozenges.    FOLLOW ANY ADDITIONAL PRE OP INSTRUCTIONS YOU RECEIVED FROM YOUR SURGEON'S OFFICE!!!   Oral Hygiene is also important to reduce your risk of infection.        Remember - BRUSH YOUR TEETH THE MORNING OF SURGERY WITH YOUR REGULAR TOOTHPASTE  Do NOT smoke after Midnight the night before surgery.  STOP TAKING all Vitamins, Herbs and supplements 1 week before your surgery.   JARDIANCE- Stop taking this medication 72 hours before your surgery.  Last Dose will be taken on Monday November 20, 2023  DO NOT TAKE your Spironolactone OR Entresto the morning of your surgery.   Take ONLY these medicines the morning of surgery with A SIP OF WATER: Bisoprolol  HS?   If You have been diagnosed with Sleep Apnea - Bring CPAP mask and tubing day of surgery. We will provide you with a CPAP machine on the day of your surgery.                   You may not have any metal on your body including  jewelry, and body piercing  Do not wear  lotions, powders, cologne, or deodorant  Men may shave face and neck.  Contacts, Hearing Aids, dentures or bridgework may not be worn into surgery. DENTURES WILL BE REMOVED PRIOR TO SURGERY PLEASE DO NOT APPLY "Poly grip" OR  ADHESIVES!!!  You may bring a small overnight bag with you on the day of surgery, only pack items that are not valuable. [facility] IS NOT RESPONSIBLE   FOR VALUABLES THAT ARE LOST OR STOLEN.   Do not bring your home medications to the hospital. The Pharmacy will dispense medications listed on your medication list to you during your admission in the Hospital.  Special Instructions: Bring a copy of your healthcare power of attorney and living will documents the day of surgery, if you wish to have them scanned into your [facility] Records- EPIC  Please read over the following fact sheets you were given: IF YOU HAVE QUESTIONS ABOUT YOUR PRE-OP INSTRUCTIONS, PLEASE CALL [phone].     Pre-operative 5 CHG Bath Instructions   You can play a key role in reducing the risk of infection after surgery. Your skin needs to be as free of germs as possible. You can reduce the number of germs on your skin by washing with CHG (chlorhexidine gluconate) soap before surgery. CHG is an antiseptic soap that kills germs and continues to kill germs even after washing.   DO NOT use if you have an allergy to chlorhexidine/CHG or antibacterial soaps. If your skin becomes reddened or irritated, stop using the CHG and notify one of our RNs at [phone]  Please shower with the CHG soap starting 4 days before surgery using the following schedule: START SHOWERS ON Monday November 20, 2023  Please keep in mind the following:  DO NOT shave, including legs and underarms, starting the day of your first shower.   You may shave your face at any point before/day of surgery.   Place clean sheets on your bed the day you start using CHG soap. Use a clean washcloth (not used since being washed) for each shower. DO NOT sleep with pets once you start  using the CHG.   CHG Shower Instructions:  If you choose to wash your hair and private area, wash first with your normal shampoo/soap.  After you use shampoo/soap, rinse your hair and body thoroughly to remove shampoo/soap residue.  Turn the water OFF and apply about 3 tablespoons (45 ml) of CHG soap to a CLEAN washcloth.  Apply CHG soap ONLY FROM YOUR NECK DOWN TO YOUR TOES (washing for 3-5 minutes)  DO NOT use CHG soap on face, private areas, open wounds, or sores.  Pay special attention to the area where your surgery is being performed.  If you are having back surgery, having someone wash your back for you may be helpful.  Wait 2 minutes after CHG soap is applied, then you may rinse off the CHG soap.  Pat dry with a clean towel  Put on clean clothes/pajamas   If you choose to wear lotion, please use ONLY the CHG-compatible lotions on the back of this paper.     Additional instructions for the day of surgery: DO NOT APPLY any lotions, deodorants, cologne, or perfumes.   Put on clean/comfortable clothes.  Brush your teeth.  Ask your nurse before applying any prescription medications to the skin.      CHG Compatible Lotions   Aveeno Moisturizing lotion  Cetaphil Moisturizing Cream  Cetaphil Moisturizing Lotion  Clairol Herbal Essence Moisturizing Lotion, Dry Skin  Clairol Herbal Essence Moisturizing Lotion, Extra Dry Skin  Clairol Herbal Essence Moisturizing Lotion, Normal Skin  Curel Age Defying Therapeutic Moisturizing Lotion with Alpha Hydroxy  Curel Extreme Care Body Lotion  Curel Soothing Hands Moisturizing Hand Lotion  Curel Therapeutic Moisturizing Cream, Fragrance-Free  Curel Therapeutic Moisturizing Lotion, Fragrance-Free  Curel Therapeutic Moisturizing Lotion, Original Formula  Eucerin Daily Replenishing Lotion  Eucerin Dry Skin Therapy Plus Alpha Hydroxy Crme  Eucerin Dry Skin Therapy Plus Alpha Hydroxy Lotion  Eucerin Original Crme  Eucerin Original Lotion   Eucerin Plus Crme Eucerin Plus Lotion  Eucerin TriLipid Replenishing Lotion  Keri Anti-Bacterial Hand Lotion  Keri Deep Conditioning Original Lotion Dry Skin Formula Softly Scented  Keri Deep Conditioning Original Lotion, Fragrance Free Sensitive Skin Formula  Keri Lotion Fast Absorbing Fragrance Free Sensitive Skin Formula  Keri Lotion Fast Absorbing Softly Scented Dry Skin Formula  Keri Original Lotion  Keri Skin Renewal Lotion Keri Silky Smooth Lotion  Keri Silky Smooth Sensitive Skin Lotion  Nivea Body Creamy Conditioning Oil  Nivea Body Extra Enriched Lotion  Nivea Body Original Lotion  Nivea Body Sheer Moisturizing Lotion Nivea Crme  Nivea Skin Firming Lotion  NutraDerm 30 Skin Lotion  NutraDerm Skin Lotion  NutraDerm Therapeutic Skin Cream  NutraDerm Therapeutic Skin Lotion  ProShield Protective Hand Cream  Provon moisturizing lotion   FAILURE TO FOLLOW THESE INSTRUCTIONS MAY RESULT IN THE CANCELLATION OF YOUR SURGERY  PATIENT SIGNATURE_________________________________  NURSE SIGNATURE__________________________________  ________________________________________________________________________        [name]    An incentive spirometer is a tool that can help keep your lungs clear and active. This tool measures how well you are filling your lungs with each breath.  Taking long deep breaths may help reverse or decrease the chance of developing breathing (pulmonary) problems (especially infection) following: A long period of time when you are unable to move or be active. BEFORE THE PROCEDURE  If the spirometer includes an indicator to show your best effort, your nurse or respiratory therapist will set it to a desired goal. If possible, sit up straight or lean slightly forward. Try not to slouch. Hold the incentive spirometer in an upright position. INSTRUCTIONS FOR USE  Sit on the edge of your bed if possible, or sit up as far as you can in bed or on a  chair. Hold the incentive spirometer in an upright position. Breathe out normally. Place the mouthpiece in your mouth and seal your lips tightly around it. Breathe in slowly and as deeply as possible, raising the piston or the ball toward the top of the column. Hold your breath for 3-5 seconds or for as long as possible. Allow the piston or ball to fall to the bottom of the column. Remove the mouthpiece from your mouth and breathe out normally. Rest for a few seconds and repeat Steps 1 through 7 at least 10 times every 1-2 hours when you are awake. Take your time and take a few normal breaths between deep breaths. The spirometer may include an indicator to show your best effort. Use the indicator as a goal to work toward during each repetition. After each set of 10 deep breaths, practice coughing to be sure your lungs are clear. If you have an incision (the cut made at the time of surgery), support your incision when coughing by placing a pillow or rolled up towels firmly against it. Once you are able to get out of bed, walk around indoors and cough well. You may stop using the incentive spirometer when instructed by your caregiver.  RISKS AND COMPLICATIONS Take your time so you do not get dizzy or light-headed. If you are in pain, you may need to take or ask for pain medication before doing incentive spirometry. It is harder to take a deep breath if you are having pain. AFTER USE Rest and breathe slowly and easily. It can be helpful to keep track of a log of your progress. Your caregiver can provide you with a simple table to help with this. If you are using the spirometer at home, follow these instructions: SEEK MEDICAL CARE IF:  You are having difficultly using the spirometer. You have trouble using the spirometer as often as instructed. Your pain medication is not giving enough relief while using the spirometer. You develop fever of 100.5 F (38.1 C) or higher.                                                                                                     SEEK IMMEDIATE MEDICAL CARE IF:  You cough up bloody sputum that had not been present before. You develop fever of 102 F (38.9 C) or greater. You develop worsening pain at or near the incision site. MAKE SURE YOU:  Understand these instructions. Will watch your  condition. Will get help right away if you are not doing well or get worse. Document Released: 01/16/2007 Document Revised: 11/28/2011 Document Reviewed: 03/19/2007 [facility] Patient Information 2014 [place], [place].        WHAT IS A BLOOD TRANSFUSION? Blood Transfusion Information  A transfusion is the replacement of blood or some of its parts. Blood is made up of multiple cells which provide different functions. Red blood cells carry oxygen and are used for blood loss replacement. White blood cells fight against infection. Platelets control bleeding. Plasma helps clot blood. Other blood products are available for specialized needs, such as hemophilia or other clotting disorders. BEFORE THE TRANSFUSION  Who gives blood for transfusions?  Healthy volunteers who are fully evaluated to make sure their blood is safe. This is blood bank blood. Transfusion therapy is the safest it has ever been in the practice of medicine. Before blood is taken from a donor, a complete history is taken to make sure that person has no history of diseases nor engages in risky social behavior (examples are intravenous drug use or sexual activity with multiple partners). The donor's travel history is screened to minimize risk of transmitting infections, such as malaria. The donated blood is tested for signs of infectious diseases, such as HIV and hepatitis. The blood is then tested to be sure it is compatible with you in order to minimize the chance of a transfusion reaction. If you or a relative donates blood, this is often done in anticipation of surgery and is not appropriate for  emergency situations. It takes many days to process the donated blood. RISKS AND COMPLICATIONS Although transfusion therapy is very safe and saves many lives, the main dangers of transfusion include:  Getting an infectious disease. Developing a transfusion reaction. This is an allergic reaction to something in the blood you were given. Every precaution is taken to prevent this. The decision to have a blood transfusion has been considered carefully by your caregiver before blood is given. Blood is not given unless the benefits outweigh the risks. AFTER THE TRANSFUSION Right after receiving a blood transfusion, you will usually feel much better and more energetic. This is especially true if your red blood cells have gotten low (anemic). The transfusion raises the level of the red blood cells which carry oxygen, and this usually causes an energy increase. The nurse administering the transfusion will monitor you carefully for complications. HOME CARE INSTRUCTIONS  No special instructions are needed after a transfusion. You may find your energy is better. Speak with your caregiver about any limitations on activity for underlying diseases you may have. SEEK MEDICAL CARE IF:  Your condition is not improving after your transfusion. You develop redness or irritation at the intravenous (IV) site. SEEK IMMEDIATE MEDICAL CARE IF:  Any of the following symptoms occur over the next 12 hours: Shaking chills. You have a temperature by mouth above 102 F (38.9 C), not controlled by medicine. Chest, back, or muscle pain. People around you feel you are not acting correctly or are confused. Shortness of breath or difficulty breathing. Dizziness and fainting. You get a rash or develop hives. You have a decrease in urine output. Your urine turns a dark color or changes to pink, red, or brown. Any of the following symptoms occur over the next 10 days: You have a temperature by mouth above 102 F (38.9 C), not  controlled by medicine. Shortness of breath. Weakness after normal activity. The white part of the eye turns yellow (jaundice).  You have a decrease in the amount of urine or are urinating less often. Your urine turns a dark color or changes to pink, red, or brown. Document Released: 09/02/2000 Document Revised: 11/28/2011 Document Reviewed: 04/21/2008 [facility] Patient Information 2014 [place], [place].  _______________________________________________________________________          If you would like to see a video about joint replacement:   [email or web address]

## 2023-11-14 ENCOUNTER — Encounter (HOSPITAL_COMMUNITY)
Admission: RE | Admit: 2023-11-14 | Discharge: 2023-11-14 | Disposition: A | Discharge status: Home / Self Care | Payer: Commercial Managed Care - PPO | Source: Ambulatory Visit | Attending: Orthopaedic Surgery | Admitting: Orthopaedic Surgery

## 2023-11-14 VITALS — BP 100/65 | HR 78 | Temp 98.3°F | Resp 14 | Ht 72.0 in | Wt 223.0 lb

## 2023-11-14 DIAGNOSIS — I10 Essential (primary) hypertension: Secondary | ICD-10-CM | POA: Diagnosis not present

## 2023-11-14 DIAGNOSIS — M1612 Unilateral primary osteoarthritis, left hip: Secondary | ICD-10-CM | POA: Insufficient documentation

## 2023-11-14 DIAGNOSIS — Z01812 Encounter for preprocedural laboratory examination: Secondary | ICD-10-CM | POA: Insufficient documentation

## 2023-11-14 DIAGNOSIS — I2510 Atherosclerotic heart disease of native coronary artery without angina pectoris: Secondary | ICD-10-CM | POA: Diagnosis not present

## 2023-11-14 DIAGNOSIS — Z01818 Encounter for other preprocedural examination: Secondary | ICD-10-CM

## 2023-11-14 LAB — BASIC METABOLIC PANEL
Anion gap: 10 (ref 5–15)
BUN: 23 mg/dL (ref 8–23)
CO2: 25 mmol/L (ref 22–32)
Calcium: 9.5 mg/dL (ref 8.9–10.3)
Chloride: 103 mmol/L (ref 98–111)
Creatinine, Ser: 1.21 mg/dL (ref 0.61–1.24)
GFR, Estimated: >60 mL/min (ref >60)
Glucose, Bld: 99 mg/dL (ref 70–99)
Potassium: 4.8 mmol/L (ref 3.5–5.1)
Sodium: 138 mmol/L (ref 135–145)

## 2023-11-14 LAB — CBC
HCT: 51.8 % (ref 39.0–52.0)
Hemoglobin: 16.5 g/dL (ref 13.0–17.0)
MCH: 31.5 pg (ref 26.0–34.0)
MCHC: 31.9 g/dL (ref 30.0–36.0)
MCV: 99 fL (ref 80.0–100.0)
Platelets: 159 10*3/uL (ref 150–400)
RBC: 5.23 MIL/uL (ref 4.22–5.81)
RDW: 13.2 % (ref 11.5–15.5)
WBC: 8.2 10*3/uL (ref 4.0–10.5)
nRBC: 0 % (ref 0.0–0.2)

## 2023-11-14 LAB — SURGICAL PCR SCREEN
MRSA, PCR: NEGATIVE
Staphylococcus aureus: NEGATIVE

## 2023-11-16 ENCOUNTER — Ambulatory Visit: Payer: Commercial Managed Care - PPO | Attending: Cardiology | Admitting: *Deleted

## 2023-11-16 DIAGNOSIS — I4891 Unspecified atrial fibrillation: Secondary | ICD-10-CM

## 2023-11-16 DIAGNOSIS — Z5181 Encounter for therapeutic drug level monitoring: Secondary | ICD-10-CM

## 2023-11-16 DIAGNOSIS — I6350 Cerebral infarction due to unspecified occlusion or stenosis of unspecified cerebral artery: Secondary | ICD-10-CM | POA: Diagnosis not present

## 2023-11-16 LAB — POCT INR: INR: 2.3 (ref 2.0–3.0)

## 2023-11-16 NOTE — Anesthesia Preprocedure Evaluation (Addendum)
 Anesthesia Evaluation  Patient identified by MRN, date of birth, ID band Patient awake    Reviewed: Allergy & Precautions, NPO status , Patient's Chart, lab work & pertinent test results  History of Anesthesia Complications Negative for: history of anesthetic complications  Airway Mallampati: II  TM Distance: >3 FB Neck ROM: Full    Dental no notable dental hx. (+) Teeth Intact, Dental Advisory Given   Pulmonary sleep apnea    Pulmonary exam normal breath sounds clear to auscultation       Cardiovascular hypertension, Pt. on medications and Pt. on home beta blockers + Cardiac Stents (2013) and +CHF (HFrEF)  Normal cardiovascular exam+ dysrhythmias Atrial Fibrillation + Cardiac Defibrillator  Rhythm:Regular Rate:Normal  Echo 06/22/2022 1. Left ventricular ejection fraction, by estimation, is 30 to 35%. The  left ventricle has moderately decreased function. The left ventricle  demonstrates regional wall motion abnormalities (see scoring  diagram/findings for description). The left  ventricular internal cavity size was mildly dilated. Left ventricular  diastolic parameters are indeterminate. The average left ventricular  global longitudinal strain is -6.9 %. The global longitudinal strain is  abnormal.   2. Slow flow at LV apex without formed mural thormbus by Definity  contrast.   3. Right ventricular systolic function is normal. The right ventricular  size is normal. The estimated right ventricular systolic pressure is 21.7  mmHg.   4. Left atrial size was mildly dilated.   5. The mitral valve is abnormal. Mild mitral valve regurgitation.   6. The aortic valve is tricuspid. There is mild calcification of the  aortic valve. Aortic valve regurgitation is trivial. Aortic valve  sclerosis/calcification is present, without any evidence of aortic  stenosis.   7. The inferior vena cava is normal in size with greater than 50%   respiratory variability, suggesting right atrial pressure of 3 mmHg.     Neuro/Psych CVA    GI/Hepatic Neg liver ROS,,,  Endo/Other  negative endocrine ROS    Renal/GU negative Renal ROSLab Results      Component                Value               Date                      K                        4.8                 11/14/2023                CO2                      25                  11/14/2023                BUN                      23                  11/14/2023                CREATININE               1.21  11/14/2023                GFRNONAA                 >60                 11/14/2023                           Musculoskeletal  (+) Arthritis , Osteoarthritis,    Abdominal   Peds  Hematology Pt on lovenox  Lab Results      Component                Value               Date                      WBC                      8.2                 11/14/2023                HGB                      16.5                11/14/2023                HCT                      51.8                11/14/2023                MCV                      99.0                11/14/2023                PLT                      159                 11/14/2023              Anesthesia Other Findings   Reproductive/Obstetrics                             Anesthesia Physical Anesthesia Plan  ASA: 3  Anesthesia Plan: General   Post-op Pain Management:    Induction: Intravenous  PONV Risk Score and Plan: Treatment may vary due to age or medical condition, Ondansetron, Midazolam and Dexamethasone  Airway Management Planned: Video Laryngoscope Planned  Additional Equipment: ClearSight  Intra-op Plan:   Post-operative Plan: Extubation in OR  Informed Consent:      Dental advisory given  Plan Discussed with: CRNA and Surgeon  Anesthesia Plan Comments: (See PAT note 11/14/2023)       Anesthesia Quick Evaluation

## 2023-11-16 NOTE — Patient Instructions (Addendum)
 11/24/23  Hip replacement  Labs  11/14/23:  Hgb 16.5  Hct 51.8  Plts 159  Scr 1.21  CrCl 79.21  Wt 103.8kg  3/1  Take last dose of warfarin 3/2  No Lovenox or warfarin 3/3 - 3-6  Lovenox 150mg  sq at 7am 3/7  No Lovenox -----surgery ---------Warfarin 5mg  pm 3/8  Lovenox 150mg  sq at 7am and warfarin 7.5mg  pm 3/9 - 3/10  Lovenox 150mg  sq at 7am and warfarin 2.5mg  pm 3/11  Lovenox 150mg  sq at 7am and warfarin 5mg  pm 3/12   Lovenox 150mg  sq at 7am and INR appt at 10:45am

## 2023-11-16 NOTE — Progress Notes (Signed)
 Anesthesia Chart Review   Case: [id number] Date/Time: 11/24/23 0945   Procedure: LEFT TOTAL HIP ARTHROPLASTY ANTERIOR APPROACH (Left: Hip)   Anesthesia type: Choice   Pre-op diagnosis: endstage osteoarthritis left hip   Location: [facility] / [facility]   Surgeons: Kathryne Hitch, MD       DISCUSSION:72 y.o. never smoker with h/o HTN, OSA, Stroke, h/o CAD s/p BMS to LAD and OM1, HFrEF with Saint Jude ICD for primary prevention (device orders in 11/13/2023 progress note), paroxysmal AF, left hip OA scheduled for above procedure 11/24/2023 with Dr. Doneen Poisson.   Per cardiology preoperative evaluation 10/11/23,  "Preoperative Cardiovascular Risk Assessment: -Patient's RCRI score is 11%   The patient affirms he has been doing well without any new cardiac symptoms. They are able to achieve 6 METS without cardiac limitations. Therefore, based on ACC/AHA guidelines, the patient would be at acceptable risk for the planned procedure without further cardiovascular testing. The patient was advised that if he develops new symptoms prior to surgery to contact our office to arrange for a follow-up visit, and he verbalized understanding.    The patient was advised that if he develops new symptoms prior to surgery to contact our office to arrange for a follow-up visit, and he verbalized understanding.   Per office protocol, patient can hold warfarin for 5 days prior to procedure.  Patient will need bridging with Lovenox"  Last dose of Warfarin 11/18/2023 with instructions for bridge instructions outlined in nursing note 11/16/2023.  VS: BP 100/65 Comment: right arm sitting  Pulse 78   Temp 36.8 C (Oral)   Resp 14   Ht 6' (1.829 m)   Wt 101.2 kg   SpO2 96%   BMI 30.24 kg/m   PROVIDERS: Donetta Potts, MD is PCP   Primary Cardiologist:  Nona Dell, MD  LABS: Labs reviewed: Acceptable for surgery. (all labs ordered are listed, but only abnormal results are displayed)  Labs  Reviewed  SURGICAL PCR SCREEN  CBC  BASIC METABOLIC PANEL  TYPE AND SCREEN     IMAGES:   EKG:   CV: Cardiac Cath 06/21/2018  There is severe left ventricular systolic dysfunction. The left ventricular ejection fraction is 25-35% by visual estimate. LV end diastolic pressure is normal. Minimal disease with underlying two-vessel disease involving LAD and circumflex: Ost Cx to Prox Cx stent is 5% stenosed. Prox LAD stent is 10% stenosed. Mid Cx lesion is 40% stenosed.   Angiographically minimal disease with widely patent stents in the circumflex and LAD. Severely reduced LVEF of 30% with regional wall motion normality in the anterior and inferior apex with swirling of flow. Normal LVEDP  Echo 06/22/2022 1. Left ventricular ejection fraction, by estimation, is 30 to 35%. The  left ventricle has moderately decreased function. The left ventricle  demonstrates regional wall motion abnormalities (see scoring  diagram/findings for description). The left  ventricular internal cavity size was mildly dilated. Left ventricular  diastolic parameters are indeterminate. The average left ventricular  global longitudinal strain is -6.9 %. The global longitudinal strain is  abnormal.   2. Slow flow at LV apex without formed mural thormbus by Definity  contrast.   3. Right ventricular systolic function is normal. The right ventricular  size is normal. The estimated right ventricular systolic pressure is 21.7  mmHg.   4. Left atrial size was mildly dilated.   5. The mitral valve is abnormal. Mild mitral valve regurgitation.   6. The aortic valve is tricuspid.  There is mild calcification of the  aortic valve. Aortic valve regurgitation is trivial. Aortic valve  sclerosis/calcification is present, without any evidence of aortic  stenosis.   7. The inferior vena cava is normal in size with greater than 50%  respiratory variability, suggesting right atrial pressure of 3 mmHg.  Past Medical  History:  Diagnosis Date   AICD (automatic cardioverter/defibrillator) present 2006   ICD   Anterior myocardial infarction (HCC)    BCC (basal cell carcinoma) superficial 11/22/2016   Left back sholder   Chronic systolic heart failure (HCC)    Coronary atherosclerosis of native coronary artery    a. s/p BMS to LAD and OM1 b. 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   right temple   Squamous cell carcinoma in situ (SCCIS) 08/04/2006   right sholder   Squamous cell carcinoma of skin 11/09/2020   in situ- left upper back   Squamous cell carcinoma of skin 11/09/2020   KA-right dorsal hand   Stroke [facility])     Past Surgical History:  Procedure Laterality Date   AICD implantation  2011   St Jude ICD implanted for primary prevention of sudden death   BIOPSY  2020/05/10   Procedure: BIOPSY;  Surgeon: Corbin Ade, MD;  Location: [facility];  Service: Endoscopy;;   CATARACT EXTRACTION W/PHACO Left 03/27/2017   Procedure: CATARACT EXTRACTION PHACO AND INTRAOCULAR LENS PLACEMENT (IOC);  Surgeon: Gemma Payor, MD;  Location: [facility];  Service: Ophthalmology;  Laterality: Left;  CDE: 8.48   CATARACT EXTRACTION W/PHACO Right 05/29/2017   Procedure: CATARACT EXTRACTION PHACO AND INTRAOCULAR LENS PLACEMENT (IOC);  Surgeon: Gemma Payor, MD;  Location: [facility];  Service: Ophthalmology;  Laterality: Right;  CDE: 7.48   COLONOSCOPY N/A 10-May-2020   Scattered medium-mouthed diverticula in sigmoid, three sessile polyps in hepatic flexure and cecum. 5-7 mm in size. Tubular adenomas. Benign colonic mucosa. 3  year surveillance.    EYE SURGERY     FLEXIBLE SIGMOIDOSCOPY N/A 08/09/2023   Procedure: FLEXIBLE SIGMOIDOSCOPY;  Surgeon: Corbin Ade, MD;  Location: [facility];  Service: Endoscopy;  Laterality: N/A;  10:15AM, ASA 3   ICD GENERATOR CHANGEOUT N/A 01/19/2017   SJM Ellipse VR ICD generator change by Dr Johney Frame   LAPAROSCOPY N/A 10/23/2017   Procedure: LAPAROSCOPY DIAGNOSTIC;  Surgeon: Emelia Loron, MD;  Location: [facility] OR;  Service: General;  Laterality: N/A;   LEFT HEART CATH AND CORONARY ANGIOGRAPHY N/A 06/21/2018   Procedure: LEFT HEART CATH AND CORONARY ANGIOGRAPHY;  Surgeon: Marykay Lex, MD;  Location: [facility] INVASIVE CV LAB;  Service: Cardiovascular;  Laterality: N/A;   POLYPECTOMY  2020/05/10   Procedure: POLYPECTOMY;  Surgeon: Corbin Ade, MD;  [facility];  Service: Endoscopy;;   VENTRAL HERNIA REPAIR N/A 10/23/2017   Procedure: VENTRAL HERNIA REPAIR;  Surgeon: Emelia Loron, MD;  Location: [facility] OR;  Service: General;  Laterality: N/A;    MEDICATIONS:  aspirin EC 81 MG tablet   bisoprolol (ZEBETA) 5 MG tablet   dicyclomine (BENTYL)  10 MG capsule   enoxaparin (LOVENOX) 150 MG/ML injection   JARDIANCE 10 MG TABS tablet   nitroGLYCERIN (NITROSTAT) 0.4 MG SL tablet   rosuvastatin (CRESTOR) 20 MG tablet   sacubitril-valsartan (ENTRESTO) 24-26 MG   spironolactone (ALDACTONE) 25 MG tablet   warfarin (COUMADIN) 5 MG tablet   No current facility-administered medications for this encounter.    Jodell Cipro Ward, PA-C [facility]-Surgical [facility] [phone]

## 2023-11-20 ENCOUNTER — Ambulatory Visit: Payer: Commercial Managed Care - PPO | Attending: Cardiovascular Disease

## 2023-11-20 DIAGNOSIS — Z95810 Presence of automatic (implantable) cardiac defibrillator: Secondary | ICD-10-CM | POA: Diagnosis not present

## 2023-11-20 DIAGNOSIS — I5022 Chronic systolic (congestive) heart failure: Secondary | ICD-10-CM

## 2023-11-21 NOTE — Progress Notes (Signed)
 EPIC Encounter for ICM Monitoring  Patient Name: [name] is a 73 y.o. male Date: 11/21/2023 Primary Care Physican: Donetta Potts, MD Primary Cardiologist: Diona Browner Electrophysiologist: Mealor 02/01/2023 Weight: 218 lbs 06/30/2023 Weight: 221 lbs 09/12/2023 Weight: 221 lbs  11/21/2023 Weight: 221-223 lbs   Spoke with patient and heart failure questions reviewed.  Transmission results reviewed.  Pt asymptomatic for fluid accumulation.  Reports feeling well at this time and voices no complaints.   total hip replacement on 3/7.     Corvue Thoracic impedance suggesting normal fluid levels within the last month but starting downward trend on 3/2.     Prescribed:  Spironolactone 25 mg take 0.5 tablet (12.5 mg total) daily. Jardiance 10 mg take 1 tablet by mouth before breakfast   Recommendations:   Advised to limit salt intake.  No changes and encouraged to call if experiencing any fluid symptoms.   Follow-up plan: ICM clinic phone appointment on 11/29/2023 to check fluid levels post hip surgery.   91 day device clinic remote transmission 12/04/2023.       EP/Cardiology Office visits:  07/26/2024 with Dr Nelly Laurence.    12/19/2023 with Dr Diona Browner.   Copy of ICM check sent to Dr. Nelly Laurence.   3 month ICM trend: 11/20/2023.    12-14 Month ICM trend:     Karie Soda, RN 11/21/2023 7:23 AM

## 2023-11-23 NOTE — H&P (Signed)
 TOTAL HIP ADMISSION H&P  Patient is admitted for left total hip arthroplasty.  Subjective:  Chief Complaint: left hip pain  HPI: [name], 73 y.o. male, has a history of pain and functional disability in the left hip(s) due to arthritis and patient has failed non-surgical conservative treatments for greater than 12 weeks to include activity modification.  Onset of symptoms was gradual starting 2 years ago with gradually worsening course since that time.The patient noted no past surgery on the left hip(s).  Patient currently rates pain in the left hip at 10 out of 10 with activity. Patient has night pain, worsening of pain with activity and weight bearing, trendelenberg gait, pain that interfers with activities of daily living, and crepitus. Patient has evidence of subchondral cysts, subchondral sclerosis, periarticular osteophytes, and joint space narrowing by imaging studies. This condition presents safety issues increasing the risk of falls.  There is no current active infection.  Patient Active Problem List   Diagnosis Date Noted   Unilateral primary osteoarthritis, left hip 06/14/2023   Frequent stools 06/22/2021   OSA (obstructive sleep apnea)    Change in bowel habits 02/26/2020   Benign prostatic hyperplasia without lower urinary tract symptoms 01/16/2020   Upper airway cough syndrome 01/03/2019   CAP (community acquired pneumonia) 11/28/2018   Hemoptysis 11/16/2018   Abnormal nuclear stress test    Atypical angina (HCC)    Chest pain 06/19/2018   Synovial cyst of popliteal space 08/24/2017   Sprain of left ankle 08/24/2017   Unspecified abdominal hernia without obstruction or gangrene 07/04/2017   Encounter for therapeutic drug monitoring 11/13/2013   Pure hypercholesterolemia 11/07/2013   Other lacunar syndromes 11/07/2013   Disorder of prostate 11/07/2013   Cerebral artery occlusion with cerebral infarction (HCC) 11/07/2013   Automatic implantable  cardioverter-defibrillator in situ 04/14/2011   Long term (current) use of anticoagulants 12/16/2010   Chronic systolic heart failure (HCC) 09/06/2010   DOE (dyspnea on exertion) 09/06/2010   Mixed hyperlipidemia 12/04/2009   Atrial fibrillation (HCC) 11/24/2009   Cardiomyopathy, ischemic 08/05/2008   Coronary atherosclerosis due to calcified coronary lesion 08/05/2008   Past Medical History:  Diagnosis Date   AICD (automatic cardioverter/defibrillator) present 2006   ICD   Anterior myocardial infarction (HCC)    BCC (basal cell carcinoma) superficial 11/22/2016   Left back sholder   Chronic systolic heart failure (HCC)    Coronary atherosclerosis of native coronary artery    a. s/p BMS to LAD and OM1 b. 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   right temple   Squamous cell carcinoma in situ (SCCIS) 08/04/2006   right sholder   Squamous cell carcinoma of skin 11/09/2020   in situ- left upper back   Squamous cell carcinoma of skin 11/09/2020   KA-right dorsal hand   Stroke [facility])     Past Surgical History:  Procedure Laterality Date   AICD implantation  2011   St Jude ICD implanted for primary prevention of sudden death   BIOPSY  05-11-20   Procedure:  BIOPSY;  Surgeon: Corbin Ade, MD;  [facility];  Service: Endoscopy;;   CATARACT EXTRACTION W/PHACO Left 03/27/2017   Procedure: CATARACT EXTRACTION PHACO AND INTRAOCULAR LENS PLACEMENT (IOC);  Surgeon: Gemma Payor, MD;  Location: [facility];  Service: Ophthalmology;  Laterality: Left;  CDE: 8.48   CATARACT EXTRACTION  W/PHACO Right 05/29/2017   Procedure: CATARACT EXTRACTION PHACO AND INTRAOCULAR LENS PLACEMENT (IOC);  Surgeon: Gemma Payor, MD;  Location: [facility];  Service: Ophthalmology;  Laterality: Right;  CDE: 7.48   COLONOSCOPY N/A 04/21/2020   Scattered medium-mouthed diverticula in sigmoid, three sessile polyps in hepatic flexure and cecum. 5-7 mm in size. Tubular adenomas. Benign colonic mucosa. 3 year surveillance.    EYE SURGERY     FLEXIBLE SIGMOIDOSCOPY N/A 08/09/2023   Procedure: FLEXIBLE SIGMOIDOSCOPY;  Surgeon: Corbin Ade, MD;  Location[facility];  Service: Endoscopy;  Laterality: N/A;  10:15AM, ASA 3   ICD GENERATOR CHANGEOUT N/A 01/19/2017   SJM Ellipse VR ICD generator change by Dr Johney Frame   LAPAROSCOPY N/A 10/23/2017   Procedure: LAPAROSCOPY DIAGNOSTIC;  Surgeon: Emelia Loron, MD;  Location[facility] OR;  Service: General;  Laterality: N/A;   LEFT HEART CATH AND CORONARY ANGIOGRAPHY N/A 06/21/2018   Procedure: LEFT HEART CATH AND CORONARY ANGIOGRAPHY;  Surgeon: Marykay Lex, MD;  Location: [facility] INVASIVE CV LAB;  Service: Cardiovascular;  Laterality: N/A;   POLYPECTOMY  04/21/2020   Procedure: POLYPECTOMY;  Surgeon: Corbin Ade, MD;  Location[facility];  Service: Endoscopy;;   VENTRAL HERNIA REPAIR N/A 10/23/2017   Procedure: VENTRAL HERNIA REPAIR;  Surgeon: Emelia Loron, MD;  Location: [facility] OR;  Service: General;  Laterality: N/A;    No current facility-administered medications for this encounter.   Current Outpatient Medications  Medication Sig Dispense Refill Last Dose/Taking   aspirin EC 81 MG tablet Take 81 mg by mouth in the morning.   Taking   bisoprolol (ZEBETA) 5 MG tablet TAKE 1 TABLET (5 MG TOTAL) BY MOUTH DAILY. (Patient taking differently: Take 5 mg by mouth every evening.) 90 tablet 2 Taking Differently   dicyclomine (BENTYL) 10 MG capsule TAKE 1 CAPSULE (10 MG TOTAL) BY MOUTH 3 (THREE) TIMES DAILY BEFORE MEALS. FOR FREQUENT STOOLS. (Patient taking differently:  Take 10 mg by mouth 3 (three) times daily as needed (frequent stools/digestive issues).) 270 capsule 1 Taking Differently   JARDIANCE 10 MG TABS tablet TAKE 1 TABLET BY MOUTH EVERY DAY BEFORE BREAKFAST 30 tablet 11 Taking   rosuvastatin (CRESTOR) 20 MG tablet Take 1 tablet (20 mg total) by mouth daily. (Patient taking differently: Take 20 mg by mouth every evening.) 90 tablet 2 Taking Differently   sacubitril-valsartan (ENTRESTO) 24-26 MG Take 1 tablet by mouth 2 (two) times daily. 180 tablet 3 Taking   spironolactone (ALDACTONE) 25 MG tablet TAKE 1/2 TABLET BY MOUTH EVERY DAY 45 tablet 1 Taking   warfarin (COUMADIN) 5 MG tablet TAKE 1/2 TABLET DAILY EXCEPT 1 TABLET ON TUESDAYS AND SATURDAYS OR AS DIRECTED 90 tablet 3 Taking   enoxaparin (LOVENOX) 150 MG/ML injection Inject 1 mL (150 mg total) into the skin daily. At 7am. 10 mL 0    nitroGLYCERIN (NITROSTAT) 0.4 MG SL tablet Place 0.4 mg under the tongue every 5 (five) minutes x 3 doses as needed for chest pain (dissolve under tongue if no relief after 3rd dose proceed to ED or call 911).      No Known Allergies  Social History   Tobacco Use  Smoking status: Never    Passive exposure: Never   Smokeless tobacco: Never  Substance Use Topics   Alcohol use: Yes    Comment: occasional "moderate"    Family History  Problem Relation Age of Onset   Cancer Mother        lymphoma    Heart attack Father    Heart disease Brother    Colon cancer Neg Hx    Colon polyps Neg Hx      Review of Systems  Objective:  Physical Exam Vitals reviewed.  Constitutional:      Appearance: Normal appearance. He is normal weight.  HENT:     Head: Normocephalic and atraumatic.  Eyes:     Extraocular Movements: Extraocular movements intact.     Pupils: Pupils are equal, round, and reactive to light.  Cardiovascular:     Rate and Rhythm: Normal rate. Rhythm irregular.  Pulmonary:     Effort: Pulmonary effort is normal.  Abdominal:     Palpations:  Abdomen is soft.  Musculoskeletal:     Cervical back: Normal range of motion and neck supple.     Left hip: Tenderness and bony tenderness present. Decreased range of motion. Decreased strength.  Neurological:     Mental Status: He is alert and oriented to person, place, and time.  Psychiatric:        Behavior: Behavior normal.     Vital signs in last 24 hours:    Labs:   Estimated body mass index is 30.24 kg/m as calculated from the following:   Height as of 11/14/23: 6' (1.829 m).   Weight as of 11/14/23: 101.2 kg.   Imaging Review Plain radiographs demonstrate severe degenerative joint disease of the left hip(s). The bone quality appears to be good for age and reported activity level.      Assessment/Plan:  End stage arthritis, left hip(s)  The patient history, physical examination, clinical judgement of the provider and imaging studies are consistent with end stage degenerative joint disease of the left hip(s) and total hip arthroplasty is deemed medically necessary. The treatment options including medical management, injection therapy, arthroscopy and arthroplasty were discussed at length. The risks and benefits of total hip arthroplasty were presented and reviewed. The risks due to aseptic loosening, infection, stiffness, dislocation/subluxation,  thromboembolic complications and other imponderables were discussed.  The patient acknowledged the explanation, agreed to proceed with the plan and consent was signed. Patient is being admitted for inpatient treatment for surgery, pain control, PT, OT, prophylactic antibiotics, VTE prophylaxis, progressive ambulation and ADL's and discharge planning.The patient is planning to be discharged home with home health services

## 2023-11-24 ENCOUNTER — Other Ambulatory Visit: Payer: Self-pay

## 2023-11-24 ENCOUNTER — Inpatient Hospital Stay (HOSPITAL_COMMUNITY)
Admission: AD | Admit: 2023-11-24 | Discharge: 2023-11-28 | DRG: 470 | Disposition: A | Discharge status: Home Health | Payer: Commercial Managed Care - PPO | Attending: Orthopaedic Surgery | Admitting: Orthopaedic Surgery

## 2023-11-24 ENCOUNTER — Ambulatory Visit (HOSPITAL_COMMUNITY): Payer: Self-pay | Admitting: Anesthesiology

## 2023-11-24 ENCOUNTER — Encounter (HOSPITAL_COMMUNITY)
Admission: AD | Disposition: A | Discharge status: Home Health | Payer: Self-pay | Source: Home / Self Care | Attending: Orthopaedic Surgery

## 2023-11-24 ENCOUNTER — Encounter (HOSPITAL_COMMUNITY): Payer: Self-pay | Admitting: Orthopaedic Surgery

## 2023-11-24 ENCOUNTER — Ambulatory Visit (HOSPITAL_COMMUNITY): Payer: Self-pay | Admitting: Physician Assistant

## 2023-11-24 ENCOUNTER — Ambulatory Visit (HOSPITAL_COMMUNITY)

## 2023-11-24 ENCOUNTER — Observation Stay (HOSPITAL_COMMUNITY)

## 2023-11-24 DIAGNOSIS — Z85828 Personal history of other malignant neoplasm of skin: Secondary | ICD-10-CM

## 2023-11-24 DIAGNOSIS — I480 Paroxysmal atrial fibrillation: Secondary | ICD-10-CM | POA: Diagnosis present

## 2023-11-24 DIAGNOSIS — I110 Hypertensive heart disease with heart failure: Secondary | ICD-10-CM | POA: Diagnosis present

## 2023-11-24 DIAGNOSIS — E782 Mixed hyperlipidemia: Secondary | ICD-10-CM | POA: Diagnosis present

## 2023-11-24 DIAGNOSIS — I952 Hypotension due to drugs: Secondary | ICD-10-CM | POA: Diagnosis not present

## 2023-11-24 DIAGNOSIS — I5022 Chronic systolic (congestive) heart failure: Secondary | ICD-10-CM | POA: Diagnosis not present

## 2023-11-24 DIAGNOSIS — Z7984 Long term (current) use of oral hypoglycemic drugs: Secondary | ICD-10-CM

## 2023-11-24 DIAGNOSIS — Z8673 Personal history of transient ischemic attack (TIA), and cerebral infarction without residual deficits: Secondary | ICD-10-CM

## 2023-11-24 DIAGNOSIS — Z860100 Personal history of colon polyps, unspecified: Secondary | ICD-10-CM

## 2023-11-24 DIAGNOSIS — Z8249 Family history of ischemic heart disease and other diseases of the circulatory system: Secondary | ICD-10-CM

## 2023-11-24 DIAGNOSIS — Z95810 Presence of automatic (implantable) cardiac defibrillator: Secondary | ICD-10-CM

## 2023-11-24 DIAGNOSIS — M1612 Unilateral primary osteoarthritis, left hip: Secondary | ICD-10-CM | POA: Diagnosis not present

## 2023-11-24 DIAGNOSIS — R110 Nausea: Secondary | ICD-10-CM | POA: Diagnosis not present

## 2023-11-24 DIAGNOSIS — G4733 Obstructive sleep apnea (adult) (pediatric): Secondary | ICD-10-CM | POA: Diagnosis present

## 2023-11-24 DIAGNOSIS — Z807 Family history of other malignant neoplasms of lymphoid, hematopoietic and related tissues: Secondary | ICD-10-CM

## 2023-11-24 DIAGNOSIS — I252 Old myocardial infarction: Secondary | ICD-10-CM

## 2023-11-24 DIAGNOSIS — Z96642 Presence of left artificial hip joint: Secondary | ICD-10-CM

## 2023-11-24 DIAGNOSIS — Z7901 Long term (current) use of anticoagulants: Secondary | ICD-10-CM

## 2023-11-24 DIAGNOSIS — T50995A Adverse effect of other drugs, medicaments and biological substances, initial encounter: Secondary | ICD-10-CM | POA: Diagnosis not present

## 2023-11-24 DIAGNOSIS — I2510 Atherosclerotic heart disease of native coronary artery without angina pectoris: Secondary | ICD-10-CM | POA: Diagnosis present

## 2023-11-24 DIAGNOSIS — N400 Enlarged prostate without lower urinary tract symptoms: Secondary | ICD-10-CM | POA: Diagnosis present

## 2023-11-24 DIAGNOSIS — Z86008 Personal history of in-situ neoplasm of other site: Secondary | ICD-10-CM

## 2023-11-24 DIAGNOSIS — Z79899 Other long term (current) drug therapy: Secondary | ICD-10-CM

## 2023-11-24 DIAGNOSIS — Y92239 Unspecified place in hospital as the place of occurrence of the external cause: Secondary | ICD-10-CM | POA: Diagnosis not present

## 2023-11-24 DIAGNOSIS — I255 Ischemic cardiomyopathy: Secondary | ICD-10-CM | POA: Diagnosis present

## 2023-11-24 DIAGNOSIS — Z7982 Long term (current) use of aspirin: Secondary | ICD-10-CM

## 2023-11-24 LAB — TYPE AND SCREEN
ABO/RH(D): O POS
Antibody Screen: NEGATIVE

## 2023-11-24 LAB — PROTIME-INR
INR: 1.3 — ABNORMAL HIGH (ref 0.8–1.2)
Prothrombin Time: 16.1 s — ABNORMAL HIGH (ref 11.4–15.2)

## 2023-11-24 LAB — ABO/RH: ABO/RH(D): O POS

## 2023-11-24 LAB — APTT: aPTT: 34 s (ref 24–36)

## 2023-11-24 SURGERY — ARTHROPLASTY, HIP, TOTAL, ANTERIOR APPROACH
Anesthesia: General | Site: Hip | Laterality: Left

## 2023-11-24 MED ORDER — SODIUM CHLORIDE 0.9 % IR SOLN
Status: DC | PRN
  Administered 2023-11-24: 1000 mL

## 2023-11-24 MED ORDER — MENTHOL 3 MG MT LOZG: 1.0000 | LOZENGE | OROMUCOSAL | Status: DC | PRN

## 2023-11-24 MED ORDER — ONDANSETRON HCL 4 MG/2ML IJ SOLN
INTRAMUSCULAR | Status: DC | PRN
  Administered 2023-11-24: 4 mg via INTRAVENOUS

## 2023-11-24 MED ORDER — DEXAMETHASONE SODIUM PHOSPHATE 10 MG/ML IJ SOLN
INTRAMUSCULAR | Status: DC | PRN
  Administered 2023-11-24: 8 mg via INTRAVENOUS

## 2023-11-24 MED ORDER — SODIUM CHLORIDE 0.9 % IV BOLUS
500.0000 mL | Freq: Once | INTRAVENOUS | Status: AC
  Administered 2023-11-24: 500 mL via INTRAVENOUS

## 2023-11-24 MED ORDER — ORAL CARE MOUTH RINSE: 15.0000 mL | Freq: Once | OROMUCOSAL | Status: AC

## 2023-11-24 MED ORDER — CHLORHEXIDINE GLUCONATE 0.12 % MT SOLN
15.0000 mL | Freq: Once | OROMUCOSAL | Status: AC
  Administered 2023-11-24: 15 mL via OROMUCOSAL

## 2023-11-24 MED ORDER — METHOCARBAMOL 1000 MG/10ML IJ SOLN: 500.0000 mg | Freq: Four times a day (QID) | INTRAMUSCULAR | Status: DC | PRN

## 2023-11-24 MED ORDER — HYDROMORPHONE HCL 1 MG/ML IJ SOLN: 0.5000 mg | INTRAMUSCULAR | Status: DC | PRN

## 2023-11-24 MED ORDER — ONDANSETRON HCL 4 MG/2ML IJ SOLN
INTRAMUSCULAR | Status: AC
  Filled 2023-11-24: qty 2

## 2023-11-24 MED ORDER — PHENYLEPHRINE HCL-NACL 20-0.9 MG/250ML-% IV SOLN
INTRAVENOUS | Status: AC
  Filled 2023-11-24: qty 250

## 2023-11-24 MED ORDER — ACETAMINOPHEN 10 MG/ML IV SOLN: 1000.0000 mg | Freq: Once | INTRAVENOUS | Status: DC | PRN

## 2023-11-24 MED ORDER — PROPOFOL 10 MG/ML IV BOLUS
INTRAVENOUS | Status: AC
  Filled 2023-11-24: qty 20

## 2023-11-24 MED ORDER — HYDROMORPHONE HCL 1 MG/ML IJ SOLN
0.2500 mg | INTRAMUSCULAR | Status: DC | PRN
  Administered 2023-11-24: 0.5 mg via INTRAVENOUS

## 2023-11-24 MED ORDER — SACUBITRIL-VALSARTAN 24-26 MG PO TABS
1.0000 | ORAL_TABLET | Freq: Two times a day (BID) | ORAL | Status: DC
  Administered 2023-11-24 – 2023-11-27 (×6): 1 via ORAL
  Filled 2023-11-24 (×8): qty 1

## 2023-11-24 MED ORDER — ROCURONIUM BROMIDE 10 MG/ML (PF) SYRINGE
PREFILLED_SYRINGE | INTRAVENOUS | Status: AC
  Filled 2023-11-24: qty 10

## 2023-11-24 MED ORDER — WARFARIN - PHARMACIST DOSING INPATIENT: Freq: Every day | Status: DC

## 2023-11-24 MED ORDER — NITROGLYCERIN 0.4 MG SL SUBL: 0.4000 mg | SUBLINGUAL_TABLET | SUBLINGUAL | Status: DC | PRN

## 2023-11-24 MED ORDER — DIPHENHYDRAMINE HCL 12.5 MG/5ML PO ELIX: 12.5000 mg | ORAL_SOLUTION | ORAL | Status: DC | PRN

## 2023-11-24 MED ORDER — ALUM & MAG HYDROXIDE-SIMETH 200-200-20 MG/5ML PO SUSP: 30.0000 mL | ORAL | Status: DC | PRN

## 2023-11-24 MED ORDER — WARFARIN SODIUM 5 MG PO TABS
5.0000 mg | ORAL_TABLET | Freq: Once | ORAL | Status: AC
  Administered 2023-11-24: 5 mg via ORAL
  Filled 2023-11-24: qty 1

## 2023-11-24 MED ORDER — CEFAZOLIN SODIUM-DEXTROSE 2-4 GM/100ML-% IV SOLN
2.0000 g | Freq: Four times a day (QID) | INTRAVENOUS | Status: AC
  Administered 2023-11-24 (×2): 2 g via INTRAVENOUS
  Filled 2023-11-24 (×2): qty 100

## 2023-11-24 MED ORDER — LIDOCAINE HCL (PF) 2 % IJ SOLN
INTRAMUSCULAR | Status: AC
  Filled 2023-11-24: qty 5

## 2023-11-24 MED ORDER — ACETAMINOPHEN 10 MG/ML IV SOLN
INTRAVENOUS | Status: AC
  Administered 2023-11-24: 1000 mg via INTRAVENOUS
  Filled 2023-11-24: qty 100

## 2023-11-24 MED ORDER — EPHEDRINE SULFATE-NACL 50-0.9 MG/10ML-% IV SOSY
PREFILLED_SYRINGE | INTRAVENOUS | Status: DC | PRN
Start: 2023-11-24 — End: 2023-11-24
  Administered 2023-11-24: 5 mg via INTRAVENOUS

## 2023-11-24 MED ORDER — METOCLOPRAMIDE HCL 5 MG/ML IJ SOLN: 5.0000 mg | Freq: Three times a day (TID) | INTRAMUSCULAR | Status: DC | PRN

## 2023-11-24 MED ORDER — ROSUVASTATIN CALCIUM 20 MG PO TABS
20.0000 mg | ORAL_TABLET | Freq: Every evening | ORAL | Status: DC
  Administered 2023-11-24 – 2023-11-27 (×4): 20 mg via ORAL
  Filled 2023-11-24 (×4): qty 1

## 2023-11-24 MED ORDER — METHOCARBAMOL 500 MG PO TABS: 500.0000 mg | ORAL_TABLET | Freq: Four times a day (QID) | ORAL | Status: DC | PRN

## 2023-11-24 MED ORDER — ONDANSETRON HCL 4 MG PO TABS
4.0000 mg | ORAL_TABLET | Freq: Four times a day (QID) | ORAL | Status: DC | PRN
  Administered 2023-11-27: 4 mg via ORAL
  Filled 2023-11-24: qty 1

## 2023-11-24 MED ORDER — METOCLOPRAMIDE HCL 5 MG PO TABS: 5.0000 mg | ORAL_TABLET | Freq: Three times a day (TID) | ORAL | Status: DC | PRN

## 2023-11-24 MED ORDER — TRANEXAMIC ACID-NACL 1000-0.7 MG/100ML-% IV SOLN
1000.0000 mg | INTRAVENOUS | Status: AC
  Administered 2023-11-24: 1000 mg via INTRAVENOUS
  Filled 2023-11-24: qty 100

## 2023-11-24 MED ORDER — FENTANYL CITRATE (PF) 100 MCG/2ML IJ SOLN
INTRAMUSCULAR | Status: DC | PRN
  Administered 2023-11-24 (×2): 50 ug via INTRAVENOUS

## 2023-11-24 MED ORDER — ONDANSETRON HCL 4 MG/2ML IJ SOLN
4.0000 mg | Freq: Four times a day (QID) | INTRAMUSCULAR | Status: DC | PRN
  Administered 2023-11-24: 4 mg via INTRAVENOUS
  Filled 2023-11-24: qty 2

## 2023-11-24 MED ORDER — OXYCODONE HCL 5 MG PO TABS: 5.0000 mg | ORAL_TABLET | ORAL | Status: DC | PRN

## 2023-11-24 MED ORDER — SODIUM CHLORIDE 0.9 % IV SOLN: INTRAVENOUS | Status: DC

## 2023-11-24 MED ORDER — PROPOFOL 1000 MG/100ML IV EMUL
INTRAVENOUS | Status: AC
  Filled 2023-11-24: qty 100

## 2023-11-24 MED ORDER — HYDROMORPHONE HCL 1 MG/ML IJ SOLN
INTRAMUSCULAR | Status: AC
  Administered 2023-11-24: 0.5 mg via INTRAVENOUS
  Filled 2023-11-24: qty 1

## 2023-11-24 MED ORDER — PHENOL 1.4 % MT LIQD: 1.0000 | OROMUCOSAL | Status: DC | PRN

## 2023-11-24 MED ORDER — AMISULPRIDE (ANTIEMETIC) 5 MG/2ML IV SOLN: 10.0000 mg | Freq: Once | INTRAVENOUS | Status: DC | PRN

## 2023-11-24 MED ORDER — BISOPROLOL FUMARATE 5 MG PO TABS
5.0000 mg | ORAL_TABLET | Freq: Every evening | ORAL | Status: DC
  Administered 2023-11-25 – 2023-11-27 (×3): 5 mg via ORAL
  Filled 2023-11-24 (×3): qty 1

## 2023-11-24 MED ORDER — FENTANYL CITRATE (PF) 100 MCG/2ML IJ SOLN
INTRAMUSCULAR | Status: AC
  Filled 2023-11-24: qty 2

## 2023-11-24 MED ORDER — OXYCODONE HCL 5 MG PO TABS
ORAL_TABLET | ORAL | Status: AC
  Filled 2023-11-24: qty 1

## 2023-11-24 MED ORDER — SPIRONOLACTONE 12.5 MG HALF TABLET
12.5000 mg | ORAL_TABLET | Freq: Every day | ORAL | Status: DC
  Administered 2023-11-24 – 2023-11-27 (×4): 12.5 mg via ORAL
  Filled 2023-11-24 (×4): qty 1

## 2023-11-24 MED ORDER — ENOXAPARIN SODIUM 150 MG/ML IJ SOSY
150.0000 mg | PREFILLED_SYRINGE | INTRAMUSCULAR | Status: DC
  Administered 2023-11-25 – 2023-11-28 (×4): 150 mg via SUBCUTANEOUS
  Filled 2023-11-24 (×4): qty 1

## 2023-11-24 MED ORDER — PHENYLEPHRINE 80 MCG/ML (10ML) SYRINGE FOR IV PUSH (FOR BLOOD PRESSURE SUPPORT)
PREFILLED_SYRINGE | INTRAVENOUS | Status: DC | PRN
  Administered 2023-11-24: 160 ug via INTRAVENOUS

## 2023-11-24 MED ORDER — TRAMADOL HCL 50 MG PO TABS
100.0000 mg | ORAL_TABLET | Freq: Four times a day (QID) | ORAL | Status: DC | PRN
  Administered 2023-11-25 – 2023-11-28 (×6): 100 mg via ORAL
  Filled 2023-11-24 (×6): qty 2

## 2023-11-24 MED ORDER — 0.9 % SODIUM CHLORIDE (POUR BTL) OPTIME
TOPICAL | Status: DC | PRN
  Administered 2023-11-24: 1000 mL

## 2023-11-24 MED ORDER — HYDROMORPHONE HCL 1 MG/ML IJ SOLN
INTRAMUSCULAR | Status: DC | PRN
  Administered 2023-11-24 (×4): .5 mg via INTRAVENOUS

## 2023-11-24 MED ORDER — OXYCODONE HCL 5 MG/5ML PO SOLN: 5.0000 mg | Freq: Once | ORAL | Status: AC | PRN

## 2023-11-24 MED ORDER — ROCURONIUM BROMIDE 10 MG/ML (PF) SYRINGE
PREFILLED_SYRINGE | INTRAVENOUS | Status: DC | PRN
  Administered 2023-11-24: 80 mg via INTRAVENOUS

## 2023-11-24 MED ORDER — OXYCODONE HCL 5 MG PO TABS
5.0000 mg | ORAL_TABLET | Freq: Once | ORAL | Status: AC | PRN
  Administered 2023-11-24: 5 mg via ORAL

## 2023-11-24 MED ORDER — HYDROCODONE-ACETAMINOPHEN 5-325 MG PO TABS
1.0000 | ORAL_TABLET | Freq: Four times a day (QID) | ORAL | Status: DC | PRN
  Administered 2023-11-25 – 2023-11-26 (×3): 2 via ORAL
  Administered 2023-11-26: 1 via ORAL
  Administered 2023-11-27: 2 via ORAL
  Administered 2023-11-27: 1 via ORAL
  Filled 2023-11-24 (×2): qty 2
  Filled 2023-11-24: qty 1
  Filled 2023-11-24 (×3): qty 2
  Filled 2023-11-24: qty 1

## 2023-11-24 MED ORDER — PHENYLEPHRINE HCL-NACL 20-0.9 MG/250ML-% IV SOLN
INTRAVENOUS | Status: DC | PRN
  Administered 2023-11-24: 25 ug/min via INTRAVENOUS

## 2023-11-24 MED ORDER — CEFAZOLIN SODIUM-DEXTROSE 2-4 GM/100ML-% IV SOLN
2.0000 g | INTRAVENOUS | Status: AC
  Administered 2023-11-24: 2 g via INTRAVENOUS
  Filled 2023-11-24: qty 100

## 2023-11-24 MED ORDER — LACTATED RINGERS IV SOLN: INTRAVENOUS | Status: DC

## 2023-11-24 MED ORDER — PROPOFOL 10 MG/ML IV BOLUS
INTRAVENOUS | Status: DC | PRN
  Administered 2023-11-24: 100 mg via INTRAVENOUS

## 2023-11-24 MED ORDER — OXYCODONE HCL 5 MG PO TABS: 10.0000 mg | ORAL_TABLET | ORAL | Status: DC | PRN

## 2023-11-24 MED ORDER — EMPAGLIFLOZIN 10 MG PO TABS
10.0000 mg | ORAL_TABLET | Freq: Every day | ORAL | Status: DC
  Administered 2023-11-25 – 2023-11-28 (×4): 10 mg via ORAL
  Filled 2023-11-24 (×4): qty 1

## 2023-11-24 MED ORDER — ONDANSETRON HCL 4 MG/2ML IJ SOLN: 4.0000 mg | Freq: Once | INTRAMUSCULAR | Status: DC | PRN

## 2023-11-24 MED ORDER — ACETAMINOPHEN 325 MG PO TABS
325.0000 mg | ORAL_TABLET | Freq: Four times a day (QID) | ORAL | Status: DC | PRN
  Administered 2023-11-27: 650 mg via ORAL
  Filled 2023-11-24: qty 2

## 2023-11-24 MED ORDER — LIDOCAINE HCL (CARDIAC) PF 100 MG/5ML IV SOSY
PREFILLED_SYRINGE | INTRAVENOUS | Status: DC | PRN
  Administered 2023-11-24: 100 mg via INTRAVENOUS

## 2023-11-24 MED ORDER — HYDROMORPHONE HCL 2 MG/ML IJ SOLN
INTRAMUSCULAR | Status: AC
  Filled 2023-11-24: qty 1

## 2023-11-24 MED ORDER — SUGAMMADEX SODIUM 200 MG/2ML IV SOLN
INTRAVENOUS | Status: DC | PRN
  Administered 2023-11-24: 200 mg via INTRAVENOUS

## 2023-11-24 MED ORDER — DEXAMETHASONE SODIUM PHOSPHATE 10 MG/ML IJ SOLN
INTRAMUSCULAR | Status: AC
  Filled 2023-11-24: qty 1

## 2023-11-24 MED ORDER — PANTOPRAZOLE SODIUM 40 MG PO TBEC
40.0000 mg | DELAYED_RELEASE_TABLET | Freq: Every day | ORAL | Status: DC
  Administered 2023-11-25 – 2023-11-28 (×4): 40 mg via ORAL
  Filled 2023-11-24 (×4): qty 1

## 2023-11-24 MED ORDER — DOCUSATE SODIUM 100 MG PO CAPS
100.0000 mg | ORAL_CAPSULE | Freq: Two times a day (BID) | ORAL | Status: DC
  Administered 2023-11-25 – 2023-11-28 (×6): 100 mg via ORAL
  Filled 2023-11-24 (×7): qty 1

## 2023-11-24 SURGICAL SUPPLY — 37 items
ARTICULEZE HEAD (Hips) ×1 IMPLANT
BAG COUNTER SPONGE SURGICOUNT (BAG) ×1 IMPLANT
BAG ZIPLOCK 12X15 (MISCELLANEOUS) IMPLANT
BENZOIN TINCTURE PRP APPL 2/3 (GAUZE/BANDAGES/DRESSINGS) IMPLANT
BLADE SAW SGTL 18X1.27X75 (BLADE) ×1 IMPLANT
COVER PERINEAL POST (MISCELLANEOUS) ×1 IMPLANT
COVER SURGICAL LIGHT HANDLE (MISCELLANEOUS) ×1 IMPLANT
CUP ACET PINNACLE SECTR 60MM (Hips) IMPLANT
DRAPE FOOT SWITCH (DRAPES) ×1 IMPLANT
DRAPE STERI IOBAN 125X83 (DRAPES) ×1 IMPLANT
DRAPE U-SHAPE 47X51 STRL (DRAPES) ×2 IMPLANT
DRSG AQUACEL AG ADV 3.5X10 (GAUZE/BANDAGES/DRESSINGS) ×1 IMPLANT
DURAPREP 26ML APPLICATOR (WOUND CARE) ×1 IMPLANT
ELECT REM PT RETURN 15FT ADLT (MISCELLANEOUS) ×1 IMPLANT
GAUZE XEROFORM 1X8 LF (GAUZE/BANDAGES/DRESSINGS) IMPLANT
GLOVE BIO SURGEON STRL SZ7.5 (GLOVE) ×1 IMPLANT
GLOVE BIOGEL PI IND STRL 8 (GLOVE) ×2 IMPLANT
GLOVE ECLIPSE 8.0 STRL XLNG CF (GLOVE) ×1 IMPLANT
GOWN STRL REUS W/ TWL XL LVL3 (GOWN DISPOSABLE) ×2 IMPLANT
HEAD ARTICULEZE (Hips) IMPLANT
HOLDER FOLEY CATH W/STRAP (MISCELLANEOUS) ×1 IMPLANT
KIT TURNOVER KIT A (KITS) IMPLANT
LINER NEUTRAL 58X36MM PLUS4 IMPLANT
PACK ANTERIOR HIP CUSTOM (KITS) ×1 IMPLANT
PINNSECTOR W/GRIP ACE CUP 60MM (Hips) ×1 IMPLANT
SET HNDPC FAN SPRY TIP SCT (DISPOSABLE) ×1 IMPLANT
STAPLER SKIN PROX WIDE 3.9 (STAPLE) IMPLANT
STEM FEMORAL SZ5 HIGH ACTIS (Stem) IMPLANT
STRIP CLOSURE SKIN 1/2X4 (GAUZE/BANDAGES/DRESSINGS) IMPLANT
SUT ETHIBOND NAB CT1 #1 30IN (SUTURE) ×1 IMPLANT
SUT ETHILON 2 0 PS N (SUTURE) IMPLANT
SUT MNCRL AB 4-0 PS2 18 (SUTURE) IMPLANT
SUT VIC AB 0 CT1 36 (SUTURE) ×1 IMPLANT
SUT VIC AB 1 CT1 36 (SUTURE) ×1 IMPLANT
SUT VIC AB 2-0 CT1 TAPERPNT 27 (SUTURE) ×2 IMPLANT
TRAY FOLEY MTR SLVR 16FR STAT (SET/KITS/TRAYS/PACK) IMPLANT
YANKAUER SUCT BULB TIP NO VENT (SUCTIONS) ×1 IMPLANT

## 2023-11-24 NOTE — Interval H&P Note (Signed)
 History and Physical Interval Note: The patient understands that he is here today for a left total hip replacement to treat his significant left hip pain and arthritis.  There has been no acute or interval change in his medical status.  The risks and benefits of surgery have been discussed in detail and informed consent has been obtained.  The left operative hip has been marked.  11/24/2023 8:21 AM  [name]  has presented today for surgery, with the diagnosis of endstage osteoarthritis left hip.  The various methods of treatment have been discussed with the patient and family. After consideration of risks, benefits and other options for treatment, the patient has consented to  Procedure(s): LEFT TOTAL HIP ARTHROPLASTY ANTERIOR APPROACH (Left) as a surgical intervention.  The patient's history has been reviewed, patient examined, no change in status, stable for surgery.  I have reviewed the patient's chart and labs.  Questions were answered to the patient's satisfaction.     Kathryne Hitch

## 2023-11-24 NOTE — Anesthesia Postprocedure Evaluation (Signed)
 Anesthesia Post Note  Patient: [name]  Procedure(s) Performed: LEFT TOTAL HIP ARTHROPLASTY ANTERIOR APPROACH (Left: Hip)     Patient location during evaluation: PACU Anesthesia Type: General Level of consciousness: awake and alert Pain management: pain level controlled Vital Signs Assessment: post-procedure vital signs reviewed and stable Respiratory status: spontaneous breathing, nonlabored ventilation, respiratory function stable and patient connected to nasal cannula oxygen Cardiovascular status: blood pressure returned to baseline and stable Postop Assessment: no apparent nausea or vomiting Anesthetic complications: no  No notable events documented.  Last Vitals:  Vitals:   11/24/23 1300 11/24/23 1336  BP: 96/61 98/73  Pulse: 81 86  Resp: 13 18  Temp: 36.7 C (!) 36.3 C  SpO2: 98% 90%    Last Pain:  Vitals:   11/24/23 1336  TempSrc: Oral  PainSc:                  Trevor Iha

## 2023-11-24 NOTE — Anesthesia Procedure Notes (Signed)
 Procedure Name: Intubation Date/Time: 11/24/2023 9:43 AM  Performed by: Willson Lipa D, CRNAPre-anesthesia Checklist: Patient identified, Emergency Drugs available, Suction available and Patient being monitored Patient Re-evaluated:Patient Re-evaluated prior to induction Oxygen Delivery Method: Circle system utilized Preoxygenation: Pre-oxygenation with 100% oxygen Induction Type: IV induction Ventilation: Mask ventilation without difficulty Laryngoscope Size: Mac and 4 Grade View: Grade I Tube type: Oral Number of attempts: 1 Airway Equipment and Method: Stylet and Oral airway Placement Confirmation: ETT inserted through vocal cords under direct vision, positive ETCO2 and breath sounds checked- equal and bilateral Secured at: 23 cm Tube secured with: Tape Dental Injury: Teeth and Oropharynx as per pre-operative assessment

## 2023-11-24 NOTE — Op Note (Signed)
 Operative Note  Date of operation: 11/24/2023 Preoperative diagnosis: Left hip primary osteoarthritis Postoperative diagnosis: Same  Procedure: Left direct anterior total hip arthroplasty  Implants: Implant Name Type Inv. Item Serial No. Manufacturer Lot No. LRB No. Used Action  PINNSECTOR W/GRIP ACE CUP - [id number] Hips PINNSECTOR W/GRIP ACE CUP  DEPU[employer] [id number] Left 1 Implanted  LINER NEUTRAL 58X36MM PLUS4 - [id number]  LINER NEUTRAL 58X36MM PLUS4  DEPUY ORTHOPAEDICS [id number] Left 1 Implanted  STEM FEMORAL SZ5 HIGH ACTIS - [id number] Stem STEM FEMORAL SZ5 HIGH ACTIS  DEPUY ORTHOPAEDICS [id number] Left 1 Implanted  ARTICULEZE HEAD - [id number] Hips ARTICULEZE HEAD  DEPUY ORTHOPA[employer]ICS [id number] Left 1 Implanted   Surgeon: Vanita Panda. Magnus Ivan, MD Assistant: Rexene Edison, PA-C  Anesthesia: General EBL: 250 cc Antibiotics: IV Ancef Complications: None  Indications: The patient is a 73 year old gentleman with debilitating arthritis involving his left hip.  His x-rays show complete bone-on-bone wear of that left hip.  His mobility is limited and he has daily pain with left hip.  It is detrimentally affecting his quality of life and his actives daily living.  He has tried and failed all forms conservative treatment and does wish to proceed with a hip replacement we agree with this as well.  He has received cardiac clearance for the surgery.  He does have a defibrillator.  We talked in length in detail the risks of acute blood loss anemia, nerve or vessel injury, fracture, infection, dislocation, DVT, implant failure, leg length differences and wound healing issues.  He understands that our goals are hopefully decreased pain, improved mobility, and improved quality of life.  Procedure description: After informed consent was obtained and appropriate left hip was marked the patient was brought to the operating room and and general anesthesia was obtained while he was on the  stretcher.  Traction boots were placed on both his feet and next he was placed supine on the Hana fracture table with a perineal post and placed in both legs and inline skeletal traction devices but no traction applied.  His left operative hip and pelvis were assessed radiographically.  The left hip was prepped and draped with DuraPrep and sterile drapes.  A timeout was called and he was identified as correct patient and correct left hip.  An incision was then made just inferior and posterior to the ASIS and carried slightly obliquely down the leg.  Dissection was carried down to the tensor fascia lata muscle and the tensor fascia was then divided longitudinally to proceed with a direct anterior approach to the hip.  Circumflex vessels were identified and cauterized.  The hip capsule was identified and opened up in L-type format finding a large joint effusion.  There was significant osteophytes around the lateral femoral head neck.  Cobra retractors were placed around the medial and lateral femoral neck and a femoral neck cut was made with oscillating saw just proximal to the lesser trochanter.  This cut was completed with an osteotome.  A corkscrew guide was placed in the femoral head the femoral head was removed its entirety.  Is a very large femoral head and completely devoid of cartilage.  A bent Hohmann was then placed over the medial acetabular rim and remnants of the acetabular labrum and other debris removed.  Reaming was then initiated from a size 43 reamer and stepwise increments going up to a size 59 reamer with all reamers placed under direct visualization and the last reamer placed under direct fluoroscopy in order to  obtain the depth reaming, the inclination and the anteversion.  The real DePuy sector GRIPTION acetabular component size 60 was then placed without difficulty followed by a 36+4 polythene liner for that size 60 acetabular component.  Attention was then turned to the femur.  With the left  leg externally rotated to 120 degrees, extended and adducted, a Mueller retractors placed medially and Hohmann retractor behind the greater trochanter.  The lateral joint capsule was released and a box cutting osteotome was used into the femoral canal.  Broaching was then initiated using the Actis broaching system from a size 0 going up to a size 5.  With a size 5 in place we trialed a high offset femoral neck based off his anatomy and a 36+1.5 trial head ball.  The right leg was brought over and up and with traction and internal rotation reduced in the pelvis and felt like we just needed a little more leg length.  Overall we are pleased with implant sizing.  We dislocated the hip remove the trial components.  We placed the real Actis femoral component with high offset size 5 and went with a real 36+5 metal head ball.  Again this reduced in the acetabulum and we are pleased with leg length, offset, range of motion and stability assessed clinically and radiographically.  The soft tissue was then irrigated normal saline solution.  Remnants of the joint capsule were closed with interrupted #1 Ethibond suture followed by number Vicryl close the tensor fascia.  0 Vicryl was used to close deep tissue and 2-0 Vicryl was used to close subcutaneous tissue.  The skin was closed with staples.  An Aquacel dressing was applied.  The patient was taken off the Hana table, awakened, extubated and taken to the recovery room.  Rexene Edison, PA-C did assist from beginning to end during the whole case and his assistance was critically and medically necessary for soft tissue management and retraction, helping guide implant placement and a layered closure of the wound.

## 2023-11-24 NOTE — Progress Notes (Signed)
 PHARMACY - ANTICOAGULATION CONSULT NOTE  Pharmacy Consult for warfarin Indication: atrial fibrillation  No Known Allergies  Patient Measurements: Height: 6' (182.9 cm) Weight: 101.2 kg (223 lb) IBW/kg (Calculated) : 77.6  Vital Signs: Temp: 97.3 F (36.3 C) (03/07 1336) Temp Source: Oral (03/07 1336) BP: 98/73 (03/07 1336) Pulse Rate: 86 (03/07 1336)  Labs: Recent Labs    11/24/23 0753  APTT 34  LABPROT 16.1*  INR 1.3*    Estimated Creatinine Clearance: 66.9 mL/min (by C-G formula based on SCr of 1.21 mg/dL).   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 s/p BMS to LAD and OM1 b. 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   right temple   Squamous cell carcinoma in situ (SCCIS) 08/04/2006   right sholder   Squamous cell carcinoma of skin 11/09/2020   in situ- left upper back   Squamous cell carcinoma of skin 11/09/2020   KA-right dorsal hand   Stroke (HCC)     Medications:  On warfarin prior to admission for atrial fibrillation; instructed to hold on 3/1 PTA warfarin regimen: 5 mg every Tue & Sat; 2.5 mg all other days  Prescribed enoxaparin on 2/12 as bridge prior to surgery while warfarin on hold preoperatively, last dose 3/6 0700  Assessment: Pharmacy to dose warfarin  which is being resumed for this 73 yo male s/p left total hip arthroplasty today. Today's INR 1.3 2/25 Hemoglobin WNL and platelets WNL  Goal of Therapy:  Prophylaxis for CVA and systemic embolism Monitor platelets by anticoagulation protocol: Yes   Plan:  Warfarin 5 mg po x 1 today Monitor daily INR, CBC, signs/symptoms of bleeding   Thank you for allowing pharmacy to be a part of this patient's care.  Selinda Eon, PharmD, BCPS Clinical Pharmacist [place] 11/24/2023 3:00 PM

## 2023-11-24 NOTE — Evaluation (Signed)
 Physical Therapy Evaluation Patient Details Name: NAJI MEHRINGER MRN: [id number] DOB: 11-24-1950 Today's Date: 11/24/2023  History of Present Illness  73 yo male presents to therapy s/p L THA, anterior approach on 11/24/2023 due to failure of conservative measures. Pt PMH includes but is not limited to: OSA, angina, HLD, lacunar syndromes, cerebral artery occulusion with infract, cardioverter-defibrillator in situ, sCHF, A-fib, and HTN.  Clinical Impression    RAQUON MILLEDGE is a 73 y.o. male POD 0 s/p L THA. Patient reports IND with mobility at baseline. Patient is now limited by functional impairments (see PT problem list below) and requires CGA for supine to sit and min A for sit to supine for bed mobility and unable to safely complete transfer nor gait  tasks due to hypotension.  Patient instructed in exercise to facilitate ROM and circulation to manage edema. Patient will benefit from continued skilled PT interventions to address impairments and progress towards PLOF. Acute PT will follow to progress mobility and stair training in preparation for safe discharge home with family support and [facility] services . Bp s/p reports of feeling light headed and nauseated seated EOB 96/72 (65 PR) Bp s/p 2 min seated EOB 81/69 (56 PR) Bp s/p return to supine 92/67 (69 PR)      If plan is discharge home, recommend the following: A little help with walking and/or transfers;A little help with bathing/dressing/bathroom;Assistance with cooking/housework;Assist for transportation;Help with stairs or ramp for entrance   Can travel by private vehicle        Equipment Recommendations Rolling walker (2 wheels)  Recommendations for Other Services       Functional Status Assessment Patient has had a recent decline in their functional status and demonstrates the ability to make significant improvements in function in a reasonable and predictable amount of time.     Precautions / Restrictions  Precautions Precautions: Fall Restrictions Weight Bearing Restrictions Per Provider Order: No      Mobility  Bed Mobility Overal bed mobility: Needs Assistance Bed Mobility: Supine to Sit, Sit to Supine     Supine to sit: Contact guard, HOB elevated, Used rails Sit to supine: Min assist   General bed mobility comments: cues, increased time for supine to sit, sit to supine with min A for LEs    Transfers                   General transfer comment: NT due to hypotension    Ambulation/Gait               General Gait Details: NT due to hypotension  Stairs            Wheelchair Mobility     Tilt Bed    Modified Rankin (Stroke Patients Only)       Balance Overall balance assessment: Needs assistance Sitting-balance support: Feet supported, Single extremity supported Sitting balance-Leahy Scale: Poor Sitting balance - Comments: mod to min A with pt pushing to the R to offload L LE in sitting unable to maintain midline                                     Pertinent Vitals/Pain Pain Assessment Pain Assessment: 0-10 Pain Score: 6  Pain Location: L LE and hip Pain Descriptors / Indicators: Aching, Discomfort, Grimacing, Operative site guarding (pt offloading in sitting EOB with strong R lateral lean) Pain Intervention(s): Limited activity within  patient's tolerance, Monitored during session, Premedicated before session, Repositioned, Ice applied    Home Living Family/patient expects to be discharged to:: Private residence Living Arrangements: Spouse/significant other Available Help at Discharge: Family Type of Home: House Home Access: Stairs to enter (none from the garage) Entrance Stairs-Rails: None Secretary/administrator of Steps: 1 Alternate Level Stairs-Number of Steps: flight Home Layout: Two level;Bed/bath upstairs Home Equipment: None      Prior Function Prior Level of Function : Independent/Modified  Independent;Driving             Mobility Comments: IND no AD for all ADLs, self care tasks and IADLs       Extremity/Trunk Assessment        Lower Extremity Assessment Lower Extremity Assessment: LLE deficits/detail LLE Deficits / Details: ankle DF/PF 5/5 LLE Sensation: WNL    Cervical / Trunk Assessment Cervical / Trunk Assessment: Normal  Communication   Communication Communication: No apparent difficulties    Cognition Arousal: Alert Behavior During Therapy: WFL for tasks assessed/performed   PT - Cognitive impairments: No apparent impairments                         Following commands: Intact       Cueing       General Comments      Exercises Total Joint Exercises Ankle Circles/Pumps: AROM, Both, 10 reps   Assessment/Plan    PT Assessment Patient needs continued PT services  PT Problem List Decreased strength;Decreased range of motion;Decreased activity tolerance;Decreased balance;Decreased mobility;Decreased coordination;Pain       PT Treatment Interventions DME instruction;Gait training;Stair training;Functional mobility training;Therapeutic activities;Therapeutic exercise;Balance training;Neuromuscular re-education;Patient/family education;Modalities    PT Goals (Current goals can be found in the Care Plan section)  Acute Rehab PT Goals Patient Stated Goal: walk out in the yard. PT Goal Formulation: With patient Time For Goal Achievement: 12/08/23 Potential to Achieve Goals: Good    Frequency 7X/week     Co-evaluation               AM-PAC PT "6 Clicks" Mobility  Outcome Measure Help needed turning from your back to your side while in a flat bed without using bedrails?: A Little Help needed moving from lying on your back to sitting on the side of a flat bed without using bedrails?: A Little Help needed moving to and from a bed to a chair (including a wheelchair)?: A Little Help needed standing up from a chair using your  arms (e.g., wheelchair or bedside chair)?: Total Help needed to walk in hospital room?: Total Help needed climbing 3-5 steps with a railing? : Total 6 Click Score: 12    End of Session Equipment Utilized During Treatment: Gait belt Activity Tolerance: Treatment limited secondary to medical complications (Comment) (hypotension) Patient left: in bed;with call bell/phone within reach;with nursing/sitter in room;with family/visitor present Nurse Communication: Mobility status;Other (comment) (bp) PT Visit Diagnosis: Unsteadiness on feet (R26.81);Other abnormalities of gait and mobility (R26.89);Muscle weakness (generalized) (M62.81);Difficulty in walking, not elsewhere classified (R26.2);Pain Pain - Right/Left: Left Pain - part of body: Hip;Leg    Time: 5284-1324 PT Time Calculation (min) (ACUTE ONLY): 27 min   Charges:   PT Evaluation $PT Eval Low Complexity: 1 Low PT Treatments $Therapeutic Activity: 8-22 mins PT General Charges $$ ACUTE PT VISIT: 1 Visit         Johnny Bridge, PT Acute Rehab   Jacqualyn Posey 11/24/2023, 5:10 PM

## 2023-11-24 NOTE — Transfer of Care (Signed)
 Immediate Anesthesia Transfer of Care Note  Patient: [name]  Procedure(s) Performed: LEFT TOTAL HIP ARTHROPLASTY ANTERIOR APPROACH (Left: Hip)  Patient Location: PACU  Anesthesia Type:General  Level of Consciousness: awake and alert   Airway & Oxygen Therapy: Patient Spontanous Breathing and Patient connected to face mask oxygen  Post-op Assessment: Report given to RN and Post -op Vital signs reviewed and stable  Post vital signs: Reviewed and stable  Last Vitals:  Vitals Value Taken Time  BP 116/81 11/24/23 1122  Temp    Pulse 84 11/24/23 1124  Resp 17 11/24/23 1124  SpO2 98 % 11/24/23 1124  Vitals shown include unfiled device data.  Last Pain:  Vitals:   11/24/23 0747  TempSrc:   PainSc: 0-No pain         Complications: No notable events documented.

## 2023-11-25 LAB — CBC
HCT: 41.1 % (ref 39.0–52.0)
Hemoglobin: 13 g/dL (ref 13.0–17.0)
MCH: 31.8 pg (ref 26.0–34.0)
MCHC: 31.6 g/dL (ref 30.0–36.0)
MCV: 100.5 fL — ABNORMAL HIGH (ref 80.0–100.0)
Platelets: 125 10*3/uL — ABNORMAL LOW (ref 150–400)
RBC: 4.09 MIL/uL — ABNORMAL LOW (ref 4.22–5.81)
RDW: 13.4 % (ref 11.5–15.5)
WBC: 11.7 10*3/uL — ABNORMAL HIGH (ref 4.0–10.5)
nRBC: 0 % (ref 0.0–0.2)

## 2023-11-25 LAB — BASIC METABOLIC PANEL
Anion gap: 7 (ref 5–15)
BUN: 15 mg/dL (ref 8–23)
CO2: 24 mmol/L (ref 22–32)
Calcium: 8.4 mg/dL — ABNORMAL LOW (ref 8.9–10.3)
Chloride: 104 mmol/L (ref 98–111)
Creatinine, Ser: 1.04 mg/dL (ref 0.61–1.24)
GFR, Estimated: >60 mL/min (ref >60)
Glucose, Bld: 174 mg/dL — ABNORMAL HIGH (ref 70–99)
Potassium: 4.7 mmol/L (ref 3.5–5.1)
Sodium: 135 mmol/L (ref 135–145)

## 2023-11-25 LAB — PROTIME-INR
INR: 1.2 (ref 0.8–1.2)
Prothrombin Time: 15.3 s — ABNORMAL HIGH (ref 11.4–15.2)

## 2023-11-25 MED ORDER — SODIUM CHLORIDE 0.9 % IV BOLUS
500.0000 mL | Freq: Once | INTRAVENOUS | Status: AC
  Administered 2023-11-25: 500 mL via INTRAVENOUS

## 2023-11-25 MED ORDER — WARFARIN SODIUM 5 MG PO TABS
7.5000 mg | ORAL_TABLET | Freq: Once | ORAL | Status: AC
  Administered 2023-11-25: 7.5 mg via ORAL
  Filled 2023-11-25: qty 1

## 2023-11-25 MED ORDER — TRAMADOL HCL 50 MG PO TABS
50.0000 mg | ORAL_TABLET | Freq: Four times a day (QID) | ORAL | 0 refills | Status: DC | PRN
Start: 2023-11-25 — End: 2023-11-28

## 2023-11-25 NOTE — TOC Transition Note (Signed)
 Transition of Care [facility]) - Discharge Note   Patient Details  Name: [name] MRN: [id number] Date of Birth: [birth year]  Transition of [facility]) CM/SW Contact:  Diona Browner, LCSW Phone Number: 11/25/2023, 2:49 PM   Clinical Narrative:    Pt will d/c home w/ spouse. Plan for therapy is HHPT w/ [facility]. No DME needs. No TOC needs.  Final next level of care: Home w Home Health Services Barriers to Discharge: No Barriers Identified   Patient Goals and CMS Choice Patient states their goals for this hospitalization and ongoing recovery are:: return home   Choice offered to / list presented to : NA [place] ownership interest in [facility].provided to::  (NA)    Discharge Placement                       Discharge Plan and Services Additional resources added to the After Visit Summary for                    DME Agency: NA                  Social Drivers of Health (SDOH) Interventions SDOH Screenings   Food Insecurity: No Food Insecurity (11/24/2023)  Housing: Low Risk  (11/24/2023)  Transportation Needs: No Transportation Needs (11/24/2023)  Utilities: Not At Risk (11/24/2023)  Social Connections: Socially Integrated (11/24/2023)  Tobacco Use: Low Risk  (11/24/2023)     Readmission Risk Interventions     No data to display

## 2023-11-25 NOTE — Plan of Care (Signed)
  Problem: Education: Goal: Knowledge of General Education information will improve Description: Including pain rating scale, medication(s)/side effects and non-pharmacologic comfort measures Outcome: Progressing   Problem: Health Behavior/Discharge Planning: Goal: Ability to manage health-related needs will improve Outcome: Progressing   Problem: Clinical Measurements: Goal: Ability to maintain clinical measurements within normal limits will improve Outcome: Progressing Goal: Will remain free from infection Outcome: Progressing Goal: Diagnostic test results will improve Outcome: Progressing Goal: Respiratory complications will improve Outcome: Progressing Goal: Cardiovascular complication will be avoided Outcome: Progressing   Problem: Activity: Goal: Risk for activity intolerance will decrease Outcome: Progressing   Problem: Nutrition: Goal: Adequate nutrition will be maintained Outcome: Progressing   Problem: Coping: Goal: Level of anxiety will decrease Outcome: Progressing   Problem: Elimination: Goal: Will not experience complications related to bowel motility Outcome: Progressing Goal: Will not experience complications related to urinary retention Outcome: Completed/Met   Problem: Pain Managment: Goal: General experience of comfort will improve and/or be controlled Outcome: Progressing   Problem: Safety: Goal: Ability to remain free from injury will improve Outcome: Progressing   Problem: Skin Integrity: Goal: Risk for impaired skin integrity will decrease Outcome: Progressing   Problem: Education: Goal: Knowledge of the prescribed therapeutic regimen will improve Outcome: Progressing Goal: Understanding of discharge needs will improve Outcome: Progressing Goal: Individualized Educational Video(s) Outcome: Completed/Met   Problem: Activity: Goal: Ability to avoid complications of mobility impairment will improve Outcome: Progressing Goal: Ability to  tolerate increased activity will improve Outcome: Progressing   Problem: Clinical Measurements: Goal: Postoperative complications will be avoided or minimized Outcome: Progressing   Problem: Pain Management: Goal: Pain level will decrease with appropriate interventions Outcome: Progressing   Problem: Skin Integrity: Goal: Will show signs of wound healing Outcome: Progressing

## 2023-11-25 NOTE — Progress Notes (Signed)
 Subjective: 1 Day Post-Op Procedure(s) (LRB): LEFT TOTAL HIP ARTHROPLASTY ANTERIOR APPROACH (Left) Patient reports pain as mild.  His blood pressure has been running soft.  We did give him a fluid bolus but do understand that he has significant cardiac issues so we do not want to fluid overload him.  He is not nauseous like he was yesterday.  He is sitting at the bedside chair and appears comfortable.  He is talkative.  His family is in the room as well.  Objective: Vital signs in last 24 hours: Temp:  [97.3 F (36.3 C)-98 F (36.7 C)] 98 F (36.7 C) (03/08 0558) Pulse Rate:  [74-90] 85 (03/08 0920) Resp:  [9-18] 16 (03/08 0558) BP: (88-116)/(46-81) 96/46 (03/08 0920) SpO2:  [90 %-99 %] 97 % (03/08 0920)  Intake/Output from previous day: 03/07 0701 - 03/08 0700 In: 2940.4 [P.O.:720; I.V.:1895.4; IV Piggyback:325] Out: 1645 [Urine:1395; Blood:250] Intake/Output this shift: Total I/O In: 120 [P.O.:120] Out: 250 [Urine:250]  Recent Labs    11/25/23 0315  HGB 13.0   Recent Labs    11/25/23 0315  WBC 11.7*  RBC 4.09*  HCT 41.1  PLT 125*   Recent Labs    11/25/23 0315  NA 135  K 4.7  CL 104  CO2 24  BUN 15  CREATININE 1.04  GLUCOSE 174*  CALCIUM 8.4*   Recent Labs    11/24/23 0753 11/25/23 0315  INR 1.3* 1.2    Sensation intact distally Intact pulses distally Dorsiflexion/Plantar flexion intact Incision: scant drainage   Assessment/Plan: 1 Day Post-Op Procedure(s) (LRB): LEFT TOTAL HIP ARTHROPLASTY ANTERIOR APPROACH (Left) Up with therapy  He will definitely need to stay today given his orthostasis and needing just to watch him closely given his age and his limited mobility.  He is in need of further physical therapy before being discharged to home.    Kathryne Hitch 11/25/2023, 11:10 AM

## 2023-11-25 NOTE — Discharge Instructions (Signed)
 Per [facility] clinic policy, our goal is ensure optimal postoperative pain control with a multimodal pain management strategy. For all [facility] patients, our goal is to wean post-operative narcotic medications by 6 weeks post-operatively. If this is not possible due to utilization of pain medication prior to surgery, your [facility] doctor will support your acute post-operative pain control for the first 6 weeks postoperatively, with a plan to transition you back to your primary pain team following that. [name] will work to ensure a Therapist, occupational.  INSTRUCTIONS AFTER JOINT REPLACEMENT   Remove items at home which could result in a fall. This includes throw rugs or furniture in walking pathways ICE to the affected joint every three hours while awake for 30 minutes at a time, for at least the first 3-5 days, and then as needed for pain and swelling.  Continue to use ice for pain and swelling. You may notice swelling that will progress down to the foot and ankle.  This is normal after surgery.  Elevate your leg when you are not up walking on it.   Continue to use the breathing machine you got in the hospital (incentive spirometer) which will help keep your temperature down.  It is common for your temperature to cycle up and down following surgery, especially at night when you are not up moving around and exerting yourself.  The breathing machine keeps your lungs expanded and your temperature down.   DIET:  As you were doing prior to hospitalization, we recommend a well-balanced diet.  DRESSING / WOUND CARE / SHOWERING  Keep the surgical dressing until follow up.  The dressing is water proof, so you can shower without any extra covering.  IF THE DRESSING FALLS OFF or the wound gets wet inside, change the dressing with sterile gauze.  Please use good hand washing techniques before changing the dressing.  Do not use any lotions or creams on the incision until instructed by your surgeon.     ACTIVITY  Increase activity slowly as tolerated, but follow the weight bearing instructions below.   No driving for 6 weeks or until further direction given by your physician.  You cannot drive while taking narcotics.  No lifting or carrying greater than 10 lbs. until further directed by your surgeon. Avoid periods of inactivity such as sitting longer than an hour when not asleep. This helps prevent blood clots.  You may return to work once you are authorized by your doctor.     WEIGHT BEARING   Weight bearing as tolerated with assist device (walker, cane, etc) as directed, use it as long as suggested by your surgeon or therapist, typically at least 4-6 weeks.   EXERCISES  Results after joint replacement surgery are often greatly improved when you follow the exercise, range of motion and muscle strengthening exercises prescribed by your doctor. Safety measures are also important to protect the joint from further injury. Any time any of these exercises cause you to have increased pain or swelling, decrease what you are doing until you are comfortable again and then slowly increase them. If you have problems or questions, call your caregiver or physical therapist for advice.   Rehabilitation is important following a joint replacement. After just a few days of immobilization, the muscles of the leg can become weakened and shrink (atrophy).  These exercises are designed to build up the tone and strength of the thigh and leg muscles and to improve motion. Often times heat used for twenty to thirty minutes before  working out will loosen up your tissues and help with improving the range of motion but do not use heat for the first two weeks following surgery (sometimes heat can increase post-operative swelling).   These exercises can be done on a training (exercise) mat, on the floor, on a table or on a bed. Use whatever works the best and is most comfortable for you.    Use music or television  while you are exercising so that the exercises are a pleasant break in your day. This will make your life better with the exercises acting as a break in your routine that you can look forward to.   Perform all exercises about fifteen times, three times per day or as directed.  You should exercise both the operative leg and the other leg as well.  Exercises include:   Quad Sets - Tighten up the muscle on the front of the thigh (Quad) and hold for 5-10 seconds.   Straight Leg Raises - With your knee straight (if you were given a brace, keep it on), lift the leg to 60 degrees, hold for 3 seconds, and slowly lower the leg.  Perform this exercise against resistance later as your leg gets stronger.  Leg Slides: Lying on your back, slowly slide your foot toward your buttocks, bending your knee up off the floor (only go as far as is comfortable). Then slowly slide your foot back down until your leg is flat on the floor again.  Angel Wings: Lying on your back spread your legs to the side as far apart as you can without causing discomfort.  Hamstring Strength:  Lying on your back, push your heel against the floor with your leg straight by tightening up the muscles of your buttocks.  Repeat, but this time bend your knee to a comfortable angle, and push your heel against the floor.  You may put a pillow under the heel to make it more comfortable if necessary.   A rehabilitation program following joint replacement surgery can speed recovery and prevent re-injury in the future due to weakened muscles. Contact your doctor or a physical therapist for more information on knee rehabilitation.    CONSTIPATION  Constipation is defined medically as fewer than three stools per week and severe constipation as less than one stool per week.  Even if you have a regular bowel pattern at home, your normal regimen is likely to be disrupted due to multiple reasons following surgery.  Combination of anesthesia, postoperative  narcotics, change in appetite and fluid intake all can affect your bowels.   YOU MUST use at least one of the following options; they are listed in order of increasing strength to get the job done.  They are all available over the counter, and you may need to use some, POSSIBLY even all of these options:    Drink plenty of fluids (prune juice may be helpful) and high fiber foods Colace 100 mg by mouth twice a day  Senokot for constipation as directed and as needed Dulcolax (bisacodyl), take with full glass of water  Miralax (polyethylene glycol) once or twice a day as needed.  If you have tried all these things and are unable to have a bowel movement in the first 3-4 days after surgery call either your surgeon or your primary doctor.    If you experience loose stools or diarrhea, hold the medications until you stool forms back up.  If your symptoms do not get better within 1 week  or if they get worse, check with your doctor.  If you experience "the worst abdominal pain ever" or develop nausea or vomiting, please contact the office immediately for further recommendations for treatment.   ITCHING:  If you experience itching with your medications, try taking only a single pain pill, or even half a pain pill at a time.  You can also use Benadryl over the counter for itching or also to help with sleep.   TED HOSE STOCKINGS:  Use stockings on both legs until for at least 2 weeks or as directed by physician office. They may be removed at night for sleeping.  MEDICATIONS:  See your medication summary on the "After Visit Summary" that nursing will review with you.  You may have some home medications which will be placed on hold until you complete the course of blood thinner medication.  It is important for you to complete the blood thinner medication as prescribed.  PRECAUTIONS:  If you experience chest pain or shortness of breath - call 911 immediately for transfer to the hospital emergency department.    If you develop a fever greater that 101 F, purulent drainage from wound, increased redness or drainage from wound, foul odor from the wound/dressing, or calf pain - CONTACT YOUR SURGEON.                                                   FOLLOW-UP APPOINTMENTS:  If you do not already have a post-op appointment, please call the office for an appointment to be seen by your surgeon.  Guidelines for how soon to be seen are listed in your "After Visit Summary", but are typically between 1-4 weeks after surgery.  OTHER INSTRUCTIONS:   Knee Replacement:  Do not place pillow under knee, focus on keeping the knee straight while resting. CPM instructions: 0-90 degrees, 2 hours in the morning, 2 hours in the afternoon, and 2 hours in the evening. Place foam block, curve side up under heel at all times except when in CPM or when walking.  DO NOT modify, tear, cut, or change the foam block in any way.  POST-OPERATIVE OPIOID TAPER INSTRUCTIONS: It is important to wean off of your opioid medication as soon as possible. If you do not need pain medication after your surgery it is ok to stop day one. Opioids include: Codeine, Hydrocodone(Norco, Vicodin), Oxycodone(Percocet, oxycontin) and hydromorphone amongst others.  Long term and even short term use of opiods can cause: Increased pain response Dependence Constipation Depression Respiratory depression And more.  Withdrawal symptoms can include Flu like symptoms Nausea, vomiting And more Techniques to manage these symptoms Hydrate well Eat regular healthy meals Stay active Use relaxation techniques(deep breathing, meditating, yoga) Do Not substitute Alcohol to help with tapering If you have been on opioids for less than two weeks and do not have pain than it is ok to stop all together.  Plan to wean off of opioids This plan should start within one week post op of your joint replacement. Maintain the same interval or time between taking each dose  and first decrease the dose.  Cut the total daily intake of opioids by one tablet each day Next start to increase the time between doses. The last dose that should be eliminated is the evening dose.   MAKE SURE YOU:  Understand these instructions.  Get help right away if you are not doing well or get worse.    Thank you for letting us be a part of your medical care team.  It is a privilege we respect greatly.  We hope these instructions will help you stay on track for a fast and full recovery!      Dental Antibiotics:  In most cases prophylactic antibiotics for Dental procdeures after total joint surgery are not necessary.  Exceptions are as follows:  1. History of prior total joint infection  2. Severely immunocompromised (Organ Transplant, cancer chemotherapy, Rheumatoid biologic meds such as Humera)  3. Poorly controlled diabetes (A1C &gt; 8.0, blood glucose over 200)  If you have one of these conditions, contact your surgeon for an antibiotic prescription, prior to your dental procedure.

## 2023-11-25 NOTE — Progress Notes (Signed)
 Physical Therapy Treatment Patient Details Name: [name] MRN: [id number] DOB: August 12, 1951 Today's Date: 11/25/2023   History of Present Illness 73 yo male presents to therapy s/p L THA, anterior approach on 11/24/2023 due to failure of conservative measures. Pt PMH includes but is not limited to: OSA, angina, HLD, lacunar syndromes, cerebral artery occulusion with infract, cardioverter-defibrillator in situ, sCHF, A-fib, and HTN.    PT Comments  POD # 1 pm session Pt OOB in recliner.  Assisted with amb in hallway.  General Gait Details: Tolerated an increased distance 45 feet with walker Supervision/Contact Guard.  NO c/o dizziness until the end of his walk BP 109/67 "just starting to feel a little bit" (light).   Then returned to room to perform some TE's following HEP handout.  Instructed on proper tech, freq as well as use of ICE.   Pt staying an extra night due to slow progress, orthostasis and Cardiac Hx.      If plan is discharge home, recommend the following: A little help with walking and/or transfers;A little help with bathing/dressing/bathroom;Assistance with cooking/housework;Assist for transportation;Help with stairs or ramp for entrance   Can travel by private vehicle        Equipment Recommendations  Rolling walker (2 wheels)    Recommendations for Other Services       Precautions / Restrictions Precautions Precautions: Fall Precaution/Restrictions Comments: monitor BP (Cardiology likes it low) Restrictions Weight Bearing Restrictions Per Provider Order: No LLE Weight Bearing Per Provider Order: Weight bearing as tolerated     Mobility  Bed Mobility Overal bed mobility: Needs Assistance Bed Mobility: Supine to Sit     Supine to sit: Mod assist, Min assist     General bed mobility comments: OOB in recliner    Transfers Overall transfer level: Needs assistance Equipment used: Rolling walker (2 wheels) Transfers: Sit to/from Stand Sit to Stand:  Supervision           General transfer comment: VC's on proper hand placement and increased time to rise.  He's tall.    Ambulation/Gait Ambulation/Gait assistance: Supervision, Contact guard assist Gait Distance (Feet): 45 Feet Assistive device: Rolling walker (2 wheels) Gait Pattern/deviations: Step-to pattern, Decreased stance time - left, Decreased step length - left, Decreased step length - right Gait velocity: decreased     General Gait Details: Tolerated an increased distance 45 feet with walker Supervision/Contact Guard.  NO c/o dizziness until the end of his walk BP 109/67.   Stairs             Wheelchair Mobility     Tilt Bed    Modified Rankin (Stroke Patients Only)       Balance                                            Communication Communication Communication: No apparent difficulties  Cognition   Behavior During Therapy: WFL for tasks assessed/performed   PT - Cognitive impairments: No apparent impairments                       PT - Cognition Comments: AxO x 3 pleasant and motivated.  Lives home with Spouse. Following commands: Intact      Cueing    Exercises  Total Hip Replacement TE's following HEP Handout 10 reps ankle pumps 05 reps knee presses 05 reps heel slides  05 reps SAQ's 05 reps ABD Instructed how to use a belt loop to assist  Followed by ICE     General Comments        Pertinent Vitals/Pain Pain Assessment Pain Assessment: 0-10 Pain Score: 7  Pain Location: L LE and hip Pain Descriptors / Indicators: Aching, Discomfort, Grimacing, Operative site guarding Pain Intervention(s): Monitored during session, Patient requesting pain meds-RN notified, Repositioned, Ice applied    Home Living                          Prior Function            PT Goals (current goals can now be found in the care plan section) Progress towards PT goals: Progressing toward goals     Frequency    7X/week      PT Plan      Co-evaluation              AM-PAC PT "6 Clicks" Mobility   Outcome Measure  Help needed turning from your back to your side while in a flat bed without using bedrails?: A Little Help needed moving from lying on your back to sitting on the side of a flat bed without using bedrails?: A Little Help needed moving to and from a bed to a chair (including a wheelchair)?: A Little Help needed standing up from a chair using your arms (e.g., wheelchair or bedside chair)?: A Little Help needed to walk in hospital room?: A Little Help needed climbing 3-5 steps with a railing? : A Lot 6 Click Score: 17    End of Session Equipment Utilized During Treatment: Gait belt Activity Tolerance: Patient limited by pain Patient left: in chair;with call bell/phone within reach;with family/visitor present Nurse Communication: Mobility status PT Visit Diagnosis: Unsteadiness on feet (R26.81);Other abnormalities of gait and mobility (R26.89);Muscle weakness (generalized) (M62.81);Difficulty in walking, not elsewhere classified (R26.2);Pain Pain - Right/Left: Left Pain - part of body: Hip;Leg     Time: 3086-5784 PT Time Calculation (min) (ACUTE ONLY): 33 min  Charges:    $Gait Training: 8-22 mins $Therapeutic Exercise: 8-22 mins PT General Charges $$ ACUTE PT VISIT: 1 Visit                    Felecia Shelling  PTA Acute  Rehabilitation Services Office M-F          [phone]

## 2023-11-25 NOTE — Progress Notes (Signed)
 PHARMACY - ANTICOAGULATION CONSULT NOTE  Pharmacy Consult for warfarin Indication: atrial fibrillation  No Known Allergies  Patient Measurements: Height: 6' (182.9 cm) Weight: 101.2 kg (223 lb) IBW/kg (Calculated) : 77.6  Vital Signs: Temp: 98 F (36.7 C) (03/08 0558) Temp Source: Oral (03/08 0140) BP: 96/46 (03/08 0920) Pulse Rate: 85 (03/08 0920)  Labs: Recent Labs    11/24/23 0753 11/25/23 0315  HGB  --  13.0  HCT  --  41.1  PLT  --  125*  APTT 34  --   LABPROT 16.1* 15.3*  INR 1.3* 1.2  CREATININE  --  1.04    Estimated Creatinine Clearance: 77.8 mL/min (by C-G formula based on SCr of 1.04 mg/dL).   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 s/p BMS to LAD and OM1 b. 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 PTA warfarin regimen: 5 mg every Tue & Sat; 2.5 mg all other days  Prescribed enoxaparin on 2/12  as bridge prior to surgery while warfarin on hold preoperatively, last dose PTA was 3/6 @0700 .  Per anticoag note on 2/27, periop anticoag plan is as follows:  3/1  Take last dose of warfarin 3/2  No Lovenox or warfarin 3/3 - 3-6  Lovenox 150mg  sq at 7am 3/7  No Lovenox -----surgery ---------Warfarin 5mg  pm 3/8  Lovenox 150mg  sq at 7am and warfarin 7.5mg  pm 3/9 - 3/10  Lovenox 150mg  sq at 7am and warfarin 2.5mg  pm 3/11  Lovenox 150mg  sq at 7am and warfarin 5mg  pm 3/12   Lovenox 150mg  sq at 7am and INR appt at 10:45am  Assessment: Pharmacy to dose warfarin, which was resumed for this 73 YO male s/p left total hip arthroplasty on 3/7.  Today, 11/25/23 INR subtherapeutic at 1.2 Hgb 13--stable, plts 125--low, stable (plts were 159 on 2/25). Per RN,no s/sx of bleeding, incision site is clean and dry with no bleeding.   Goal of Therapy:  INR 2-3 Monitor platelets by anticoagulation protocol: Yes   Plan:  Warfarin 7.5 mg po x1 this evening Enoxaparin 150mg  [place] x1 given this AM Monitor daily INR, CBC, signs/symptoms of bleeding   Thank you for allowing pharmacy to be a part of this patient's care.  Cherylin Mylar, PharmD Clinical Pharmacist  3/8/20259:56 AM

## 2023-11-25 NOTE — Progress Notes (Addendum)
 Physical Therapy Treatment Patient Details Name: [name] MRN: [id number] DOB: 04-Jun-1951 Today's Date: 11/25/2023   History of Present Illness 73 yo male presents to therapy s/p L THA, anterior approach on 11/24/2023 due to failure of conservative measures. Pt PMH includes but is not limited to: OSA, angina, HLD, lacunar syndromes, cerebral artery occulusion with infract, cardioverter-defibrillator in situ, sCHF, A-fib, and HTN.    PT Comments  POD # 1 am session PT - Cognition Comments: AxO x 3 pleasant and motivated.  Lives home with Spouse. Assisted OOB to amb while taking Orthostatic vitals.  Pt completed Bolus. Supine      BP 96/46  HR 88 EOB          BP 99/70  HR 93 Standing   BP 102/70  HR 95 3 min amb BP 106/80  HR 95 feeling "light headed" unable to amb past 10 feet but also reports pain 8/10  Returned to room in recliner.  Performed a few TE's followed by ICE. Will see Pt again later today.   If plan is discharge home, recommend the following: A little help with walking and/or transfers;A little help with bathing/dressing/bathroom;Assistance with cooking/housework;Assist for transportation;Help with stairs or ramp for entrance   Can travel by private vehicle        Equipment Recommendations  Rolling walker (2 wheels)    Recommendations for Other Services       Precautions / Restrictions Precautions Precautions: Fall Restrictions Weight Bearing Restrictions Per Provider Order: No LLE Weight Bearing Per Provider Order: Weight bearing as tolerated     Mobility  Bed Mobility Overal bed mobility: Needs Assistance Bed Mobility: Supine to Sit     Supine to sit: Mod assist, Min assist     General bed mobility comments: demonstarted and Educated how to use a belt to self assist LE off bed.  Pt struggled and required asisst esp with scooting to EOB.    Transfers Overall transfer level: Needs assistance Equipment used: Rolling walker (2 wheels) Transfers: Sit  to/from Stand Sit to Stand: Contact guard assist, Min assist           General transfer comment: VC's on proper hand placement and increased time to rise.  Initial unsteady with increased c/o pain with WBing.    Ambulation/Gait Ambulation/Gait assistance: Min assist, Mod assist Gait Distance (Feet): 10 Feet Assistive device: Rolling walker (2 wheels) Gait Pattern/deviations: Step-to pattern, Decreased stance time - left, Decreased step length - left, Decreased step length - right Gait velocity: decreased     General Gait Details: VERY limited amb distance due to increased c/o "light headed" as well as increased pain from 5/10 EOB to 8/10 with amb.  Recliner following closely behind.   Stairs             Wheelchair Mobility     Tilt Bed    Modified Rankin (Stroke Patients Only)       Balance                                            Communication Communication Communication: No apparent difficulties  Cognition   Behavior During Therapy: WFL for tasks assessed/performed   PT - Cognitive impairments: No apparent impairments                       PT - Cognition  Comments: AxO x 3 pleasant and motivated.  Lives home with Spouse. Following commands: Intact      Cueing    Exercises  15 reps AP   10 reps gluteal squeezes    General Comments        Pertinent Vitals/Pain Pain Assessment Pain Assessment: 0-10 Pain Score: 8  Pain Location: L LE and hip Pain Descriptors / Indicators: Aching, Discomfort, Grimacing, Operative site guarding Pain Intervention(s): Monitored during session, Premedicated before session, Repositioned, Ice applied    Home Living                          Prior Function            PT Goals (current goals can now be found in the care plan section) Progress towards PT goals: Progressing toward goals    Frequency    7X/week      PT Plan      Co-evaluation               AM-PAC PT "6 Clicks" Mobility   Outcome Measure  Help needed turning from your back to your side while in a flat bed without using bedrails?: A Lot Help needed moving from lying on your back to sitting on the side of a flat bed without using bedrails?: A Lot Help needed moving to and from a bed to a chair (including a wheelchair)?: A Lot Help needed standing up from a chair using your arms (e.g., wheelchair or bedside chair)?: A Lot Help needed to walk in hospital room?: A Lot Help needed climbing 3-5 steps with a railing? : A Lot 6 Click Score: 12    End of Session Equipment Utilized During Treatment: Gait belt Activity Tolerance: Patient limited by pain Patient left: in chair;with call bell/phone within reach;with family/visitor present Nurse Communication: Mobility status PT Visit Diagnosis: Unsteadiness on feet (R26.81);Other abnormalities of gait and mobility (R26.89);Muscle weakness (generalized) (M62.81);Difficulty in walking, not elsewhere classified (R26.2);Pain Pain - Right/Left: Left Pain - part of body: Hip;Leg     Time: 9604-5409 PT Time Calculation (min) (ACUTE ONLY): 25 min  Charges:    $Gait Training: 8-22 mins $Therapeutic Activity: 8-22 mins PT General Charges $$ ACUTE PT VISIT: 1 Visit                     Felecia Shelling  PTA Acute  Rehabilitation Services Office M-F          [phone]

## 2023-11-26 DIAGNOSIS — Z8249 Family history of ischemic heart disease and other diseases of the circulatory system: Secondary | ICD-10-CM | POA: Diagnosis not present

## 2023-11-26 DIAGNOSIS — Z7982 Long term (current) use of aspirin: Secondary | ICD-10-CM | POA: Diagnosis not present

## 2023-11-26 DIAGNOSIS — G4733 Obstructive sleep apnea (adult) (pediatric): Secondary | ICD-10-CM | POA: Diagnosis present

## 2023-11-26 DIAGNOSIS — M1612 Unilateral primary osteoarthritis, left hip: Secondary | ICD-10-CM | POA: Diagnosis present

## 2023-11-26 DIAGNOSIS — R110 Nausea: Secondary | ICD-10-CM | POA: Diagnosis not present

## 2023-11-26 DIAGNOSIS — I110 Hypertensive heart disease with heart failure: Secondary | ICD-10-CM | POA: Diagnosis present

## 2023-11-26 DIAGNOSIS — Z8673 Personal history of transient ischemic attack (TIA), and cerebral infarction without residual deficits: Secondary | ICD-10-CM | POA: Diagnosis not present

## 2023-11-26 DIAGNOSIS — N400 Enlarged prostate without lower urinary tract symptoms: Secondary | ICD-10-CM | POA: Diagnosis present

## 2023-11-26 DIAGNOSIS — Z807 Family history of other malignant neoplasms of lymphoid, hematopoietic and related tissues: Secondary | ICD-10-CM | POA: Diagnosis not present

## 2023-11-26 DIAGNOSIS — I252 Old myocardial infarction: Secondary | ICD-10-CM | POA: Diagnosis not present

## 2023-11-26 DIAGNOSIS — Z95810 Presence of automatic (implantable) cardiac defibrillator: Secondary | ICD-10-CM | POA: Diagnosis not present

## 2023-11-26 DIAGNOSIS — I480 Paroxysmal atrial fibrillation: Secondary | ICD-10-CM | POA: Diagnosis present

## 2023-11-26 DIAGNOSIS — Z86008 Personal history of in-situ neoplasm of other site: Secondary | ICD-10-CM | POA: Diagnosis not present

## 2023-11-26 DIAGNOSIS — I952 Hypotension due to drugs: Secondary | ICD-10-CM | POA: Diagnosis not present

## 2023-11-26 DIAGNOSIS — Y92239 Unspecified place in hospital as the place of occurrence of the external cause: Secondary | ICD-10-CM | POA: Diagnosis not present

## 2023-11-26 DIAGNOSIS — Z79899 Other long term (current) drug therapy: Secondary | ICD-10-CM | POA: Diagnosis not present

## 2023-11-26 DIAGNOSIS — Z860100 Personal history of colon polyps, unspecified: Secondary | ICD-10-CM | POA: Diagnosis not present

## 2023-11-26 DIAGNOSIS — I255 Ischemic cardiomyopathy: Secondary | ICD-10-CM | POA: Diagnosis present

## 2023-11-26 DIAGNOSIS — Z7901 Long term (current) use of anticoagulants: Secondary | ICD-10-CM | POA: Diagnosis not present

## 2023-11-26 DIAGNOSIS — Z85828 Personal history of other malignant neoplasm of skin: Secondary | ICD-10-CM | POA: Diagnosis not present

## 2023-11-26 DIAGNOSIS — Z7984 Long term (current) use of oral hypoglycemic drugs: Secondary | ICD-10-CM | POA: Diagnosis not present

## 2023-11-26 DIAGNOSIS — I5022 Chronic systolic (congestive) heart failure: Secondary | ICD-10-CM | POA: Diagnosis present

## 2023-11-26 DIAGNOSIS — T50995A Adverse effect of other drugs, medicaments and biological substances, initial encounter: Secondary | ICD-10-CM | POA: Diagnosis not present

## 2023-11-26 DIAGNOSIS — E782 Mixed hyperlipidemia: Secondary | ICD-10-CM | POA: Diagnosis present

## 2023-11-26 DIAGNOSIS — I2510 Atherosclerotic heart disease of native coronary artery without angina pectoris: Secondary | ICD-10-CM | POA: Diagnosis present

## 2023-11-26 LAB — CBC
HCT: 36.7 % — ABNORMAL LOW (ref 39.0–52.0)
Hemoglobin: 11.8 g/dL — ABNORMAL LOW (ref 13.0–17.0)
MCH: 32.2 pg (ref 26.0–34.0)
MCHC: 32.2 g/dL (ref 30.0–36.0)
MCV: 100.3 fL — ABNORMAL HIGH (ref 80.0–100.0)
Platelets: 108 10*3/uL — ABNORMAL LOW (ref 150–400)
RBC: 3.66 MIL/uL — ABNORMAL LOW (ref 4.22–5.81)
RDW: 13.4 % (ref 11.5–15.5)
WBC: 10.8 10*3/uL — ABNORMAL HIGH (ref 4.0–10.5)
nRBC: 0 % (ref 0.0–0.2)

## 2023-11-26 LAB — PROTIME-INR
INR: 1.7 — ABNORMAL HIGH (ref 0.8–1.2)
Prothrombin Time: 19.8 s — ABNORMAL HIGH (ref 11.4–15.2)

## 2023-11-26 MED ORDER — WARFARIN SODIUM 2.5 MG PO TABS
2.5000 mg | ORAL_TABLET | Freq: Once | ORAL | Status: AC
  Administered 2023-11-26: 2.5 mg via ORAL
  Filled 2023-11-26: qty 1

## 2023-11-26 NOTE — Progress Notes (Signed)
  Subjective: Patient stable.  Get a little dizzy yesterday when he was up and around.  Having some sharp knifelike pain in the left hip with ambulation.   Objective: Vital signs in last 24 hours: Temp:  [97.5 F (36.4 C)-99.2 F (37.3 C)] 98.3 F (36.8 C) (03/09 0913) Pulse Rate:  [78-84] 83 (03/09 0913) Resp:  [15-16] 16 (03/09 0507) BP: (90-94)/(56-69) 94/69 (03/09 0913) SpO2:  [98 %-100 %] 100 % (03/09 0913)  Intake/Output from previous day: 03/08 0701 - 03/09 0700 In: 1160 [P.O.:920; I.V.:240] Out: 1675 [Urine:1675] Intake/Output this shift: No intake/output data recorded.  Exam:  Sensation intact distally Intact pulses distally Dorsiflexion/Plantar flexion intact Leg lengths equal. Labs: Recent Labs    11/25/23 0315 11/26/23 0353  HGB 13.0 11.8*   Recent Labs    11/25/23 0315 11/26/23 0353  WBC 11.7* 10.8*  RBC 4.09* 3.66*  HCT 41.1 36.7*  PLT 125* 108*   Recent Labs    11/25/23 0315  NA 135  K 4.7  CL 104  CO2 24  BUN 15  CREATININE 1.04  GLUCOSE 174*  CALCIUM 8.4*   Recent Labs    11/25/23 0315 11/26/23 0353  INR 1.2 1.7*    Assessment/Plan: Plan at this time is to keep him here 1 more day.  He is having some dizziness when he is up and around.  With his extensive cardiac history and history of defibrillator placement he would be best served to stay 1 more day prior to discharge home.  He feels a little uneasy about going home himself in his current condition.  Plan for physical therapy this morning to see how he feels but otherwise he feels close to 100% I would favor 1 more night of observation before discharge tomorrow   Burnard Bunting 11/26/2023, 9:47 AM

## 2023-11-26 NOTE — Plan of Care (Signed)
  Problem: Education: Goal: Knowledge of General Education information will improve Description: Including pain rating scale, medication(s)/side effects and non-pharmacologic comfort measures Outcome: Adequate for Discharge   Problem: Health Behavior/Discharge Planning: Goal: Ability to manage health-related needs will improve Outcome: Adequate for Discharge   Problem: Clinical Measurements: Goal: Ability to maintain clinical measurements within normal limits will improve Outcome: Progressing Goal: Will remain free from infection Outcome: Progressing Goal: Diagnostic test results will improve Outcome: Adequate for Discharge Goal: Respiratory complications will improve Outcome: Adequate for Discharge Goal: Cardiovascular complication will be avoided Outcome: Progressing   Problem: Activity: Goal: Risk for activity intolerance will decrease Outcome: Progressing   Problem: Nutrition: Goal: Adequate nutrition will be maintained Outcome: Completed/Met   Problem: Coping: Goal: Level of anxiety will decrease Outcome: Progressing   Problem: Elimination: Goal: Will not experience complications related to bowel motility Outcome: Progressing   Problem: Pain Managment: Goal: General experience of comfort will improve and/or be controlled Outcome: Adequate for Discharge   Problem: Safety: Goal: Ability to remain free from injury will improve Outcome: Progressing   Problem: Skin Integrity: Goal: Risk for impaired skin integrity will decrease Outcome: Progressing   Problem: Education: Goal: Knowledge of the prescribed therapeutic regimen will improve Outcome: Progressing Goal: Understanding of discharge needs will improve Outcome: Progressing   Problem: Activity: Goal: Ability to avoid complications of mobility impairment will improve Outcome: Progressing Goal: Ability to tolerate increased activity will improve Outcome: Adequate for Discharge   Problem: Clinical  Measurements: Goal: Postoperative complications will be avoided or minimized Outcome: Progressing   Problem: Pain Management: Goal: Pain level will decrease with appropriate interventions Outcome: Adequate for Discharge   Problem: Skin Integrity: Goal: Will show signs of wound healing Outcome: Progressing

## 2023-11-26 NOTE — Progress Notes (Signed)
 Physical Therapy Treatment Patient Details Name: [name] MRN: [id number] DOB: 1951/03/28 Today's Date: 11/26/2023   History of Present Illness 73 yo male presents to therapy s/p L THA, anterior approach on 11/24/2023 due to failure of conservative measures. Pt PMH includes but is not limited to: OSA, angina, HLD, lacunar syndromes, cerebral artery occulusion with infract, cardioverter-defibrillator in situ, sCHF, A-fib, and HTN.    PT Comments  Pt ambulated in hallway and returned to recliner.  Pt and family had many questions about pain medication however deferred the "right" amount to pt and RN. Pt also reported performing 30 reps of a few exercises this afternoon so discussed moderation and returning to starting with 10 reps (twice a day) as recommended on HEP handout.  Discussed appropriate performance of supine and sitting exercises while viewing HEP (did not perform due to previous amount per pt and reported pain).  Pt prefers to remain in hospital tonight and would benefit from further acute therapy prior to d/c. Pt will need to practice one step to enter home and appears he will need to be CGA at least (family don't seem confident to be able to physically assist when discussed today - spouse and 2 daughters present).    If plan is discharge home, recommend the following: A little help with walking and/or transfers;A little help with bathing/dressing/bathroom;Assistance with cooking/housework;Assist for transportation;Help with stairs or ramp for entrance   Can travel by private vehicle        Equipment Recommendations  Rolling walker (2 wheels)    Recommendations for Other Services       Precautions / Restrictions Precautions Precautions: Fall Restrictions LLE Weight Bearing Per Provider Order: Weight bearing as tolerated     Mobility  Bed Mobility Overal bed mobility: Needs Assistance Bed Mobility: Supine to Sit     Supine to sit: Min assist     General bed  mobility comments: pt in recliner    Transfers Overall transfer level: Needs assistance Equipment used: Rolling walker (2 wheels) Transfers: Sit to/from Stand Sit to Stand: Contact guard assist           General transfer comment: verbal cues for UE and LE positioning for pain control    Ambulation/Gait Ambulation/Gait assistance: Contact guard assist Gait Distance (Feet): 45 Feet Assistive device: Rolling walker (2 wheels) Gait Pattern/deviations: Step-to pattern, Decreased stance time - left, Antalgic Gait velocity: decreased     General Gait Details: verbal cues for sequence, RW positioning, posture, step length; pt reports more fatigue this afternoon   Stairs             Wheelchair Mobility     Tilt Bed    Modified Rankin (Stroke Patients Only)       Balance                                            Communication Communication Communication: No apparent difficulties  Cognition Arousal: Alert Behavior During Therapy: WFL for tasks assessed/performed   PT - Cognitive impairments: No apparent impairments                         Following commands: Intact      Cueing    Exercises      General Comments        Pertinent Vitals/Pain Pain Assessment Pain Assessment: 0-10  Pain Score: 6  Pain Location: L hip Pain Descriptors / Indicators: Sharp, Stabbing Pain Intervention(s): Monitored during session, Repositioned    Home Living                          Prior Function            PT Goals (current goals can now be found in the care plan section) Progress towards PT goals: Progressing toward goals    Frequency    7X/week      PT Plan      Co-evaluation              AM-PAC PT "6 Clicks" Mobility   Outcome Measure  Help needed turning from your back to your side while in a flat bed without using bedrails?: A Little Help needed moving from lying on your back to sitting on the side  of a flat bed without using bedrails?: A Little Help needed moving to and from a bed to a chair (including a wheelchair)?: A Little Help needed standing up from a chair using your arms (e.g., wheelchair or bedside chair)?: A Little Help needed to walk in hospital room?: A Little Help needed climbing 3-5 steps with a railing? : A Lot 6 Click Score: 17    End of Session Equipment Utilized During Treatment: Gait belt Activity Tolerance: Patient limited by pain Patient left: in chair;with call bell/phone within reach;with family/visitor present Nurse Communication: Mobility status PT Visit Diagnosis: Other abnormalities of gait and mobility (R26.89);Muscle weakness (generalized) (M62.81);Pain Pain - Right/Left: Left Pain - part of body: Hip     Time: 6578-4696 PT Time Calculation (min) (ACUTE ONLY): 26 min  Charges:    $Gait Training: 8-22 mins $Therapeutic Activity: 8-22 mins PT General Charges $$ ACUTE PT VISIT: 1 Visit                    Paulino Door, DPT Physical Therapist Acute Rehabilitation Services Office: [phone]    Janan Halter P[place] 11/26/2023, 3:49 PM

## 2023-11-26 NOTE — Plan of Care (Signed)
  Problem: Education: Goal: Knowledge of General Education information will improve Description: Including pain rating scale, medication(s)/side effects and non-pharmacologic comfort measures Outcome: Adequate for Discharge   Problem: Health Behavior/Discharge Planning: Goal: Ability to manage health-related needs will improve Outcome: Adequate for Discharge   Problem: Clinical Measurements: Goal: Ability to maintain clinical measurements within normal limits will improve Outcome: Progressing Goal: Will remain free from infection Outcome: Progressing Goal: Diagnostic test results will improve Outcome: Completed/Met Goal: Respiratory complications will improve Outcome: Progressing Goal: Cardiovascular complication will be avoided Outcome: Progressing   Problem: Activity: Goal: Risk for activity intolerance will decrease Outcome: Adequate for Discharge   Problem: Coping: Goal: Level of anxiety will decrease Outcome: Progressing   Problem: Elimination: Goal: Will not experience complications related to bowel motility Outcome: Progressing   Problem: Pain Managment: Goal: General experience of comfort will improve and/or be controlled Outcome: Adequate for Discharge   Problem: Safety: Goal: Ability to remain free from injury will improve Outcome: Progressing   Problem: Skin Integrity: Goal: Risk for impaired skin integrity will decrease Outcome: Progressing   Problem: Education: Goal: Knowledge of the prescribed therapeutic regimen will improve Outcome: Adequate for Discharge Goal: Understanding of discharge needs will improve Outcome: Progressing   Problem: Activity: Goal: Ability to avoid complications of mobility impairment will improve Outcome: Adequate for Discharge Goal: Ability to tolerate increased activity will improve Outcome: Adequate for Discharge   Problem: Clinical Measurements: Goal: Postoperative complications will be avoided or minimized Outcome:  Progressing   Problem: Pain Management: Goal: Pain level will decrease with appropriate interventions Outcome: Adequate for Discharge   Problem: Skin Integrity: Goal: Will show signs of wound healing Outcome: Progressing

## 2023-11-26 NOTE — Progress Notes (Signed)
 PHARMACY - ANTICOAGULATION CONSULT NOTE  Pharmacy Consult for warfarin Indication: atrial fibrillation  No Known Allergies  Patient Measurements: Height: 6' (182.9 cm) Weight: 101.2 kg (223 lb) IBW/kg (Calculated) : 77.6  Vital Signs: Temp: 98.3 F (36.8 C) (03/09 0913) Temp Source: Oral (03/09 0913) BP: 94/69 (03/09 0913) Pulse Rate: 83 (03/09 0913)  Labs: Recent Labs    11/24/23 0753 11/25/23 0315 11/26/23 0353  HGB  --  13.0 11.8*  HCT  --  41.1 36.7*  PLT  --  125* 108*  APTT 34  --   --   LABPROT 16.1* 15.3* 19.8*  INR 1.3* 1.2 1.7*  CREATININE  --  1.04  --     Estimated Creatinine Clearance: 77.8 mL/min (by C-G formula based on SCr of 1.04 mg/dL).   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 s/p BMS to LAD and OM1 b. 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 PTA warfarin regimen: 5 mg every Tue & Sat;  2.5 mg all other days  Prescribed enoxaparin on 2/12 as bridge prior to surgery while warfarin on hold preoperatively, last dose PTA was 3/6 @0700 .  Per anticoag note on 2/27, periop anticoag plan is as follows:  3/1  Take last dose of warfarin 3/2  No Lovenox or warfarin 3/3 - 3-6  Lovenox 150mg  sq at 7am 3/7  No Lovenox -----surgery ---------Warfarin 5mg  pm 3/8  Lovenox 150mg  sq at 7am and warfarin 7.5mg  pm 3/9 - 3/10  Lovenox 150mg  sq at 7am and warfarin 2.5mg  pm 3/11  Lovenox 150mg  sq at 7am and warfarin 5mg  pm 3/12   Lovenox 150mg  sq at 7am and INR appt at 10:45am  Assessment: Pharmacy to dose warfarin, which was resumed for this 73 YO male s/p left total hip arthroplasty on 3/7.  Today, 11/26/23 INR 1.2 >> 1.7 from yesterday, still subtherapeutic  Hgb 13 >> 11.8 from yesterday--low, stable Plts 125 >> 108 from yesterday--low (plts were 159 on 2/25). RN reports no s/sx of bleeding at this time   Goal of Therapy:  INR 2-3 Monitor platelets by anticoagulation protocol: Yes   Plan:  Warfarin 2.5 mg po x1 this evening Enoxaparin 150mg  [place] x1 given this AM Monitor daily INR, CBC, signs/symptoms of bleeding   Thank you for allowing pharmacy to be a part of  this patient's care.  Cherylin Mylar, PharmD Clinical Pharmacist  3/9/202510:29 AM

## 2023-11-26 NOTE — Progress Notes (Signed)
 Physical Therapy Treatment Patient Details Name: [name] MRN: [id number] DOB: 01/31/1951 Today's Date: 11/26/2023   History of Present Illness 73 yo male presents to therapy s/p L THA, anterior approach on 11/24/2023 due to failure of conservative measures. Pt PMH includes but is not limited to: OSA, angina, HLD, lacunar syndromes, cerebral artery occulusion with infract, cardioverter-defibrillator in situ, sCHF, A-fib, and HTN.    PT Comments  Pt reports performing numerous glut squeezes and ankle pumps last night and this morning, so focused session on ambulation.  Pt reports "stabbing" left hip pain and UE fatigue limiting ambulating today.  Per ortho note, plan on discharge home tomorrow.     If plan is discharge home, recommend the following: A little help with walking and/or transfers;A little help with bathing/dressing/bathroom;Assistance with cooking/housework;Assist for transportation;Help with stairs or ramp for entrance   Can travel by private vehicle        Equipment Recommendations  Rolling walker (2 wheels)    Recommendations for Other Services       Precautions / Restrictions Precautions Precautions: Fall Restrictions LLE Weight Bearing Per Provider Order: Weight bearing as tolerated     Mobility  Bed Mobility Overal bed mobility: Needs Assistance Bed Mobility: Supine to Sit     Supine to sit: Min assist     General bed mobility comments: pt not able to self assist, assist for L LE provided due to "stabbing" hip pain    Transfers Overall transfer level: Needs assistance Equipment used: Rolling walker (2 wheels) Transfers: Sit to/from Stand Sit to Stand: Contact guard assist           General transfer comment: verbal cues for UE and LE positioning for pain control    Ambulation/Gait Ambulation/Gait assistance: Contact guard assist Gait Distance (Feet): 40 Feet Assistive device: Rolling walker (2 wheels) Gait Pattern/deviations: Step-to  pattern, Decreased stance time - left, Antalgic Gait velocity: decreased     General Gait Details: verbal cues for sequence, RW positioning, posture, step length; pt reports mild dizziness which did not worsen, able to continue ambulating distance until fatigue   Stairs             Wheelchair Mobility     Tilt Bed    Modified Rankin (Stroke Patients Only)       Balance                                            Communication Communication Communication: No apparent difficulties  Cognition Arousal: Alert Behavior During Therapy: WFL for tasks assessed/performed   PT - Cognitive impairments: No apparent impairments                         Following commands: Intact      Cueing    Exercises      General Comments        Pertinent Vitals/Pain Pain Assessment Pain Assessment: 0-10 Pain Score: 8  Pain Location: L hip Pain Descriptors / Indicators: Sharp, Stabbing Pain Intervention(s): Repositioned, Monitored during session    Home Living                          Prior Function            PT Goals (current goals can now be found in the care  plan section) Progress towards PT goals: Progressing toward goals    Frequency    7X/week      PT Plan      Co-evaluation              AM-PAC PT "6 Clicks" Mobility   Outcome Measure  Help needed turning from your back to your side while in a flat bed without using bedrails?: A Little Help needed moving from lying on your back to sitting on the side of a flat bed without using bedrails?: A Little Help needed moving to and from a bed to a chair (including a wheelchair)?: A Little Help needed standing up from a chair using your arms (e.g., wheelchair or bedside chair)?: A Little Help needed to walk in hospital room?: A Little Help needed climbing 3-5 steps with a railing? : A Lot 6 Click Score: 17    End of Session Equipment Utilized During Treatment: Gait  belt Activity Tolerance: Patient limited by pain Patient left: in chair;with call bell/phone within reach;with family/visitor present   PT Visit Diagnosis: Other abnormalities of gait and mobility (R26.89);Muscle weakness (generalized) (M62.81);Pain Pain - Right/Left: Left Pain - part of body: Hip     Time: 1610-9604 PT Time Calculation (min) (ACUTE ONLY): 17 min  Charges:    $Gait Training: 8-22 mins PT General Charges $$ ACUTE PT VISIT: 1 Visit                     Paulino Door, DPT Physical Therapist Acute Rehabilitation Services Office: [phone]    [name] P[place] 11/26/2023, 12:23 PM

## 2023-11-27 ENCOUNTER — Encounter (HOSPITAL_COMMUNITY): Payer: Self-pay | Admitting: Orthopaedic Surgery

## 2023-11-27 LAB — CBC
HCT: 34.3 % — ABNORMAL LOW (ref 39.0–52.0)
Hemoglobin: 10.9 g/dL — ABNORMAL LOW (ref 13.0–17.0)
MCH: 31.4 pg (ref 26.0–34.0)
MCHC: 31.8 g/dL (ref 30.0–36.0)
MCV: 98.8 fL (ref 80.0–100.0)
Platelets: 108 10*3/uL — ABNORMAL LOW (ref 150–400)
RBC: 3.47 MIL/uL — ABNORMAL LOW (ref 4.22–5.81)
RDW: 13.4 % (ref 11.5–15.5)
WBC: 8.4 10*3/uL (ref 4.0–10.5)
nRBC: 0 % (ref 0.0–0.2)

## 2023-11-27 LAB — PROTIME-INR
INR: 2.1 — ABNORMAL HIGH (ref 0.8–1.2)
Prothrombin Time: 23.6 s — ABNORMAL HIGH (ref 11.4–15.2)

## 2023-11-27 MED ORDER — WARFARIN SODIUM 2.5 MG PO TABS
2.5000 mg | ORAL_TABLET | Freq: Once | ORAL | Status: AC
  Administered 2023-11-27: 2.5 mg via ORAL
  Filled 2023-11-27: qty 1

## 2023-11-27 NOTE — TOC Progression Note (Signed)
 Transition of Care [facility]) - Progression Note    Patient Details  Name: BANKS CHAIKIN MRN: [id number] Date of Birth: 23-Jul-1951  Transition of Care [facility]) CM/SW Contact  Howell Rucks, RN Phone Number: 11/27/2023, 12:21 PM  Clinical Narrative:  PT eval, recommend RW, [employer], rep-Mitch, to deliver to bedside. NCM called to pt's room, sw pt's spouse, introduce self, informed RW to be delivered to bedside and [facility] PT was pre-arranged by Orthopedic office with [facility], spouse voiced understanding. TOC will continue to follow.        Barriers to Discharge: No Barriers Identified  Expected Discharge Plan and Services                           DME Agency: NA                   Social Determinants of Health (SDOH) Interventions SDOH Screenings   Food Insecurity: No Food Insecurity (11/24/2023)  Housing: Low Risk  (11/24/2023)  Transportation Needs: No Transportation Needs (11/24/2023)  Utilities: Not At Risk (11/24/2023)  Social Connections: Socially Integrated (11/24/2023)  Tobacco Use: Low Risk  (11/24/2023)    Readmission Risk Interventions    11/27/2023   12:19 PM  Readmission Risk Prevention Plan  Post Dischage Appt Complete  Medication Screening Complete  Transportation Screening Complete

## 2023-11-27 NOTE — Progress Notes (Signed)
 Physical Therapy Treatment Patient Details Name: [name] MRN: [id number] DOB: [birth year] Today's Date: 11/27/2023   History of Present Illness 73 yo male presents to therapy s/p L THA, anterior approach on 11/24/2023 due to failure of conservative measures. Pt PMH includes but is not limited to: OSA, angina, HLD, lacunar syndromes, cerebral artery occulusion with infract, cardioverter-defibrillator in situ, sCHF, A-fib, and HTN.    PT Comments  POD # 3 am session Assisted OOB to amb to bathroom then in hallway to practice ONE step he has to enter home.  General bed mobility comments: self able using his belt to guide L LE off bed.  Also requires increased time. General transfer comment: still needing VC's on proper hand placement each time to "use right leg at first for support". General Gait Details: distance limited by increased c/o "light headed' at end of walk.  BP are stable/holding at 114/65 HR 78 initial standing to 100/66 with HR after 20 feet.  Believe his "light headedness" is attributaed to excessive leaning/support on walker as well as drop in HgB from 13.0 to 10.9.  Educated pt to avoid "pushing down on walker too much and allow B LE to support " his weight.  Also Educated on intermittent activity along with periods of rest, due to his cardiac Hx. Pt's bedroom and shower is upstairs.  At this point, pt will need to "live downstairs". Pt would benefit from [facility] PT esp to monitor vitals.  Pt also needs a RW.    If plan is discharge home, recommend the following: A little help with walking and/or transfers;A little help with bathing/dressing/bathroom;Assistance with cooking/housework;Assist for transportation;Help with stairs or ramp for entrance   Can travel by private vehicle        Equipment Recommendations  Rolling walker (2 wheels)    Recommendations for Other Services       Precautions / Restrictions Precautions Precautions: Fall Precaution/Restrictions Comments:  monitor BP (Cardiology likes it low) Restrictions Weight Bearing Restrictions Per Provider Order: No     Mobility  Bed Mobility Overal bed mobility: Needs Assistance Bed Mobility: Supine to Sit     Supine to sit: Supervision, Contact guard     General bed mobility comments: self able using his belt to guide L LE off bed.  Also requires increased time.    Transfers Overall transfer level: Needs assistance Equipment used: Rolling walker (2 wheels) Transfers: Sit to/from Stand Sit to Stand: Contact guard assist, Supervision           General transfer comment: still needing VC's on proper hand placement each time to "use right leg at first for support".    Ambulation/Gait Ambulation/Gait assistance: Contact guard assist, Supervision Gait Distance (Feet): 24 Feet Assistive device: Rolling walker (2 wheels) Gait Pattern/deviations: Step-to pattern, Decreased stance time - left, Antalgic Gait velocity: decreased     General Gait Details: distance limited by increased c/o "light headed' at end of walk.  BP are stable/holding at 114/65 HR 78 initial standing to 100/66 with HR after 20 feet.  Believe his "light headedness" is attributaed to excessive leaning/support on walker as well as drop in HgB from 13.0 to 10.9.  Educated pt to avoid "pushing down on walker too much and allow B LE to support " his weight.  Also Educated on intermittent activity along with periods of rest, due to his cardiac Hx.   Stairs Stairs: Yes Stairs assistance: Contact guard assist Stair Management: No rails, Step to pattern, Forwards, With  walker Number of Stairs: 1 General stair comments: practiced up/down ONE step with walker no rails at Contact Guard Assist and 50% VC's on proper sequencing.  Max c/o fatigue after this activity.   Wheelchair Mobility     Tilt Bed    Modified Rankin (Stroke Patients Only)       Balance                                             Communication    Cognition Arousal: Alert Behavior During Therapy: WFL for tasks assessed/performed   PT - Cognitive impairments: No apparent impairments                       PT - Cognition Comments: AxO x 3 pleasant and motivated.  Lives home with Spouse. Following commands: Intact      Cueing    Exercises      General Comments        Pertinent Vitals/Pain Pain Assessment Pain Assessment: 0-10 Pain Score: 7  Pain Location: L hip Pain Descriptors / Indicators: Sharp, Stabbing, Operative site guarding Pain Intervention(s): Monitored during session, Premedicated before session, Repositioned, Ice applied    Home Living                          Prior Function            PT Goals (current goals can now be found in the care plan section) Progress towards PT goals: Progressing toward goals    Frequency    7X/week      PT Plan      Co-evaluation              AM-PAC PT "6 Clicks" Mobility   Outcome Measure  Help needed turning from your back to your side while in a flat bed without using bedrails?: A Little Help needed moving from lying on your back to sitting on the side of a flat bed without using bedrails?: A Little Help needed moving to and from a bed to a chair (including a wheelchair)?: A Little Help needed standing up from a chair using your arms (e.g., wheelchair or bedside chair)?: A Little Help needed to walk in hospital room?: A Little Help needed climbing 3-5 steps with a railing? : A Little 6 Click Score: 18    End of Session Equipment Utilized During Treatment: Gait belt Activity Tolerance: Patient limited by pain;Patient limited by fatigue Patient left: in chair;with call bell/phone within reach;with family/visitor present Nurse Communication: Mobility status PT Visit Diagnosis: Other abnormalities of gait and mobility (R26.89);Muscle weakness (generalized) (M62.81);Pain Pain - Right/Left: Left Pain - part of body:  Hip     Time: 6045-4098 PT Time Calculation (min) (ACUTE ONLY): 40 min  Charges:    $Gait Training: 8-22 mins $Therapeutic Activity: 23-37 mins PT General Charges $$ ACUTE PT VISIT: 1 Visit                     Felecia Shelling  PTA Acute  Rehabilitation Services Office M-F          [phone]

## 2023-11-27 NOTE — Progress Notes (Signed)
 Physical Therapy Treatment Patient Details Name: MAXIMILIAN TALLO MRN: [id number] DOB: 1951/01/10 Today's Date: 11/27/2023   History of Present Illness 73 yo male presents to therapy s/p L THA, anterior approach on 11/24/2023 due to failure of conservative measures. Pt PMH includes but is not limited to: OSA, angina, HLD, lacunar syndromes, cerebral artery occulusion with infract, cardioverter-defibrillator in situ, sCHF, A-fib, and HTN.    PT Comments  POD # 3 pm session Pt was feeling nauseated after spaghetti lunch so took some Zofran.  Pt was able to amb to and from the bathroom with NT after lunch.  Feeling "better" stated Pt.  Assisted with amb in hallway.  General transfer comment: less VC's needed this session.  Increased time/effort mainly due to his height. General Gait Details: Pt was able to double his amb distance this afternoon with NO c/o light headed.  BP after 35 feet was 119/82  HR 84.  Pt did better to avoid excessive lean on walker as well as breathing.  Pt reported 4/10 hip pain and was able to tolerate more weight through L LE. Then returned to room and assisted to bathroom. Then to perform some TE's following HEP handout.  Instructed on proper tech, freq as well as use of ICE.   Per Alice Rieger MD, Pt to be D/C tomorrow.     If plan is discharge home, recommend the following: A little help with walking and/or transfers;A little help with bathing/dressing/bathroom;Assistance with cooking/housework;Assist for transportation;Help with stairs or ramp for entrance   Can travel by private vehicle        Equipment Recommendations  Rolling walker (2 wheels)    Recommendations for Other Services       Precautions / Restrictions Precautions Precautions: Fall Precaution/Restrictions Comments: monitor BP (Cardiology likes it low) Restrictions Weight Bearing Restrictions Per Provider Order: No LLE Weight Bearing Per Provider Order: Weight bearing as tolerated     Mobility  Bed  Mobility Overal bed mobility: Needs Assistance Bed Mobility: Supine to Sit     Supine to sit: Supervision, Contact guard     General bed mobility comments: OOB in recliner    Transfers Overall transfer level: Needs assistance Equipment used: Rolling walker (2 wheels) Transfers: Sit to/from Stand Sit to Stand: Supervision, Contact guard assist           General transfer comment: less VC's needed this session.  Increased time/effort mainly due to his height.    Ambulation/Gait Ambulation/Gait assistance: Supervision, Contact guard assist Gait Distance (Feet): 75 Feet Assistive device: Rolling walker (2 wheels) Gait Pattern/deviations: Step-to pattern, Decreased stance time - left, Antalgic Gait velocity: decreased     General Gait Details: Pt was able to double his amb distance this afternoon with NO c/o light headed.  BP after 35 feet was 119/82  HR 84.  Pt did better to avoid excessive lean on walker as well as breathing.  Pt reported 4/10 hip pain and was able to tolerate more weight through L LE.   Stairs Stairs: Yes Stairs assistance: Contact guard assist Stair Management: No rails, Step to pattern, Forwards, With walker Number of Stairs: 1 General stair comments: practiced up/down ONE step with walker no rails at Contact Guard Assist and 50% VC's on proper sequencing.  Max c/o fatigue after this activity.   Wheelchair Mobility     Tilt Bed    Modified Rankin (Stroke Patients Only)       Balance  Communication    Cognition Arousal: Alert Behavior During Therapy: WFL for tasks assessed/performed   PT - Cognitive impairments: No apparent impairments                       PT - Cognition Comments: AxO x 3 pleasant and motivated.  Lives home with Spouse.  One step to enter home and bedroom/shower is upstairs.  Pt plans to live downstairs "at first".  Pt will have HH PT.  Pt can practice  a flight of stairs with HH PT. Following commands: Intact      Cueing Cueing Techniques: Verbal cues  Exercises  10 reps LAQs  10 resp heel slides    General Comments        Pertinent Vitals/Pain Pain Assessment Pain Assessment: 0-10 Pain Score: 4  Pain Location: L hip Pain Descriptors / Indicators: Sharp, Stabbing, Operative site guarding Pain Intervention(s): Monitored during session, Repositioned, Ice applied    Home Living                          Prior Function            PT Goals (current goals can now be found in the care plan section) Progress towards PT goals: Progressing toward goals    Frequency    7X/week      PT Plan      Co-evaluation              AM-PAC PT "6 Clicks" Mobility   Outcome Measure  Help needed turning from your back to your side while in a flat bed without using bedrails?: A Little Help needed moving from lying on your back to sitting on the side of a flat bed without using bedrails?: A Little Help needed moving to and from a bed to a chair (including a wheelchair)?: A Little Help needed standing up from a chair using your arms (e.g., wheelchair or bedside chair)?: A Little Help needed to walk in hospital room?: A Little Help needed climbing 3-5 steps with a railing? : A Little 6 Click Score: 18    End of Session Equipment Utilized During Treatment: Gait belt Activity Tolerance: Patient tolerated treatment well Patient left: in chair;with call bell/phone within reach;with family/visitor present Nurse Communication: Mobility status PT Visit Diagnosis: Other abnormalities of gait and mobility (R26.89);Muscle weakness (generalized) (M62.81);Pain Pain - Right/Left: Left Pain - part of body: Hip     Time: 4540-9811 PT Time Calculation (min) (ACUTE ONLY): 35 min  Charges:    $Gait Training: 8-22 mins $Therapeutic Activity: 8-22 mins PT General Charges $$ ACUTE PT VISIT: 1 Visit                     Felecia Shelling  PTA Acute  Rehabilitation Services Office M-F          [phone]

## 2023-11-27 NOTE — Progress Notes (Signed)
 Subjective: 3 Days Post-Op Procedure(s) (LRB): LEFT TOTAL HIP ARTHROPLASTY ANTERIOR APPROACH (Left) Patient reports pain as moderate to severe. Wants to see how he does with Norco and PT today.   Objective: Vital signs in last 24 hours: Temp:  [97.7 F (36.5 C)-98.9 F (37.2 C)] 97.7 F (36.5 C) (03/10 0615) Pulse Rate:  [71-83] 75 (03/10 0615) Resp:  [16-17] 16 (03/10 0615) BP: (87-98)/(55-67) 97/67 (03/10 0615) SpO2:  [94 %-96 %] 96 % (03/10 0615)  Intake/Output from previous day: 03/09 0701 - 03/10 0700 In: -  Out: 450 [Urine:450] Intake/Output this shift: Total I/O In: 120 [P.O.:120] Out: 350 [Urine:350]  Recent Labs    11/25/23 0315 11/26/23 0353 11/27/23 0305  HGB 13.0 11.8* 10.9*   Recent Labs    11/26/23 0353 11/27/23 0305  WBC 10.8* 8.4  RBC 3.66* 3.47*  HCT 36.7* 34.3*  PLT 108* 108*   Recent Labs    11/25/23 0315  NA 135  K 4.7  CL 104  CO2 24  BUN 15  CREATININE 1.04  GLUCOSE 174*  CALCIUM 8.4*   Recent Labs    11/26/23 0353 11/27/23 0305  INR 1.7* 2.1*    Dorsiflexion/Plantar flexion intact Incision: scant drainage Compartment soft   Assessment/Plan: 3 Days Post-Op Procedure(s) (LRB): LEFT TOTAL HIP ARTHROPLASTY ANTERIOR APPROACH (Left) Up with therapy Possible discharge to home later today. Will check on patient at lunch time.       Vergia Chea 11/27/2023, 9:57 AM

## 2023-11-27 NOTE — Progress Notes (Addendum)
 PHARMACY - ANTICOAGULATION CONSULT NOTE  Pharmacy Consult for warfarin Indication: atrial fibrillation  No Known Allergies  Patient Measurements: Height: 6' (182.9 cm) Weight: 101.2 kg (223 lb) IBW/kg (Calculated) : 77.6  Vital Signs: Temp: 97.7 F (36.5 C) (03/10 0615) Temp Source: Oral (03/09 2108) BP: 97/67 (03/10 0615) Pulse Rate: 75 (03/10 0615)  Labs: Recent Labs    11/24/23 0753 11/24/23 0753 11/25/23 0315 11/26/23 0353 11/27/23 0305  HGB  --    < > 13.0 11.8* 10.9*  HCT  --   --  41.1 36.7* 34.3*  PLT  --   --  125* 108* 108*  APTT 34  --   --   --   --   LABPROT 16.1*  --  15.3* 19.8* 23.6*  INR 1.3*  --  1.2 1.7* 2.1*  CREATININE  --   --  1.04  --   --    < > = values in this interval not displayed.    Estimated Creatinine Clearance: 77.8 mL/min (by C-G formula based on SCr of 1.04 mg/dL).   Medications:  On warfarin prior to admission for atrial fibrillation and prior Hx CVA; instructed to hold on 3/1 for orthopedic surgery (see plan below). PTA warfarin regimen: 5 mg every Tue & Sat; 2.5 mg all other days  Prescribed enoxaparin on 2/12 as bridge prior to surgery while warfarin on hold preoperatively, last dose PTA was 3/6 AM  Per anticoag note on 2/27, periop anticoag plan is as follows:  3/1  Take last dose of warfarin 3/2  No Lovenox or warfarin 3/3 - 3-6  Lovenox 150mg  sq at 7am 3/7  No Lovenox -----surgery ---------Warfarin 5mg  pm 3/8  Lovenox 150mg  sq at 7am and warfarin 7.5mg  pm 3/9 - 3/10  Lovenox 150mg  sq at 7am and warfarin 2.5mg  pm 3/11  Lovenox 150mg  sq at 7am and warfarin 5mg  pm 3/12   Lovenox 150mg  sq at 7am and INR appt at 10:45am  Assessment: Pharmacy to dose warfarin, which was resumed for this 73 YO male s/p left total hip arthroplasty on 3/7.  Today, 11/27/23: INR increasing appropriately after resuming warfarin; now therapeutic today  Hgb low and continues to drift down, consistent with expected post-op ABLA; no signs of  bleeding noted per care team Plts remain slightly low, but stable from yesterday RN reports no s/sx of bleeding at this time   Goal of Therapy:  INR 2-3 Monitor platelets by anticoagulation protocol: Yes   Plan:  Repeat warfarin 2.5 mg PO x 1 today Continue enoxaparin 150 mg SQ daily;  Per [facility] plan above, LMWH to continue through 3/12 (based on the assumption INR not being monitored), but can and should be discontinued once INR stable and therapeutic > 24 hr, to minimize post-op bleeding risk If discharging today, would continue warfarin as outlined in anticoagulation plan above; however, would NOT continue LMWH (last dose 3/10), as I expect INR will remain therapeutic tomorrow Monitor daily INR, CBC, signs/symptoms of bleeding   Thank you for allowing pharmacy to be a part of this patient's care.  Bernadene Person, PharmD, BCPS [phone] 11/27/2023, 7:36 AM

## 2023-11-27 NOTE — Progress Notes (Signed)
 Patient ID: [name], male   DOB: Dec 05, 1950, 73 y.o.   MRN: [id number]  Patient with some nausea and lightheadedness. Will hold on discharge to allow for medication adjustments. Currently trying Norco to see if patient tolerates better than Tramadol.

## 2023-11-28 LAB — CBC
HCT: 33.7 % — ABNORMAL LOW (ref 39.0–52.0)
Hemoglobin: 10.6 g/dL — ABNORMAL LOW (ref 13.0–17.0)
MCH: 31.9 pg (ref 26.0–34.0)
MCHC: 31.5 g/dL (ref 30.0–36.0)
MCV: 101.5 fL — ABNORMAL HIGH (ref 80.0–100.0)
Platelets: 118 10*3/uL — ABNORMAL LOW (ref 150–400)
RBC: 3.32 MIL/uL — ABNORMAL LOW (ref 4.22–5.81)
RDW: 13.2 % (ref 11.5–15.5)
WBC: 7.5 10*3/uL (ref 4.0–10.5)
nRBC: 0 % (ref 0.0–0.2)

## 2023-11-28 LAB — PROTIME-INR
INR: 2 — ABNORMAL HIGH (ref 0.8–1.2)
Prothrombin Time: 22.5 s — ABNORMAL HIGH (ref 11.4–15.2)

## 2023-11-28 MED ORDER — WARFARIN SODIUM 5 MG PO TABS: 5.0000 mg | ORAL_TABLET | Freq: Once | ORAL | Status: DC

## 2023-11-28 MED ORDER — HYDROCODONE-ACETAMINOPHEN 5-325 MG PO TABS
1.0000 | ORAL_TABLET | Freq: Four times a day (QID) | ORAL | 0 refills | Status: DC | PRN
Start: 2023-11-28 — End: 2023-12-19

## 2023-11-28 MED ORDER — ONDANSETRON HCL 4 MG PO TABS: 4.0000 mg | ORAL_TABLET | Freq: Four times a day (QID) | ORAL | 0 refills | Status: DC | PRN

## 2023-11-28 NOTE — Plan of Care (Signed)
  Problem: Activity: Goal: Risk for activity intolerance will decrease Outcome: Progressing   Problem: Pain Managment: Goal: General experience of comfort will improve and/or be controlled Outcome: Progressing   Problem: Activity: Goal: Ability to tolerate increased activity will improve Outcome: Progressing

## 2023-11-28 NOTE — Progress Notes (Addendum)
 PHARMACY - ANTICOAGULATION CONSULT NOTE  Pharmacy Consult for warfarin Indication: atrial fibrillation  No Known Allergies  Patient Measurements: Height: 6' (182.9 cm) Weight: 101.2 kg (223 lb) IBW/kg (Calculated) : 77.6  Vital Signs: Temp: 97.8 F (36.6 C) (03/11 0610) Temp Source: Oral (03/11 0610) BP: 98/64 (03/11 0610) Pulse Rate: 77 (03/11 0610)  Labs: Recent Labs    11/26/23 0353 11/27/23 0305 11/28/23 0336  HGB 11.8* 10.9* 10.6*  HCT 36.7* 34.3* 33.7*  PLT 108* 108* 118*  LABPROT 19.8* 23.6* 22.5*  INR 1.7* 2.1* 2.0*    Estimated Creatinine Clearance: 77.8 mL/min (by C-G formula based on SCr of 1.04 mg/dL).   Medications:  On warfarin prior to admission for atrial fibrillation and prior Hx CVA; instructed to hold on 3/1 for orthopedic surgery (see plan below). PTA warfarin regimen: 5 mg every Tue & Sat; 2.5 mg all other days  Prescribed enoxaparin on 2/12 as bridge prior to surgery while warfarin on hold preoperatively, last dose PTA was 3/6 AM  Per anticoag note on 2/27, periop anticoag plan is as follows:  3/1  Take last dose of warfarin 3/2  No Lovenox or warfarin 3/3 - 3-6  Lovenox 150mg  sq at 7am 3/7  No Lovenox -----surgery ---------Warfarin 5mg  pm 3/8  Lovenox 150mg  sq at 7am and warfarin 7.5mg  pm 3/9 - 3/10  Lovenox 150mg  sq at 7am and warfarin 2.5mg  pm 3/11  Lovenox 150mg  sq at 7am and warfarin 5mg  pm 3/12   Lovenox 150mg  sq at 7am and INR appt at 10:45am  Assessment: Pharmacy to dose warfarin, which was resumed for this 73 YO male s/p left total hip arthroplasty on 3/7.  Today, 11/28/23: INR increasing appropriately after resuming warfarin; remains therapeutic today but slightly lower; now borderline low Hgb low but stable; consistent with expected post-op ABLA; no signs of bleeding noted per care team Plts remain slightly low, but improved from yesterday RN reports no s/sx of bleeding at this time   Goal of Therapy:  INR 2-3 Monitor  platelets by anticoagulation protocol: Yes   Plan:  Warfarin 5 mg PO x 1 today Will d/c Lovenox (already received dose this AM) If discharging today, would continue warfarin as outlined in anticoagulation plan  Daily INR, CBC, signs/symptoms of bleeding   Thank you for allowing pharmacy to be a part of this patient's care.  Bernadene Person, PharmD, BCPS [phone] 11/28/2023, 11:15 AM

## 2023-11-28 NOTE — Discharge Summary (Signed)
 Patient ID: [name] MRN: [id number] DOB/AGE: 1951-02-26 73 y.o.  Admit date: 11/24/2023 Discharge date: 11/28/2023  Admission Diagnoses:  Principal Problem:   Unilateral primary osteoarthritis, left hip Active Problems:   Status post total replacement of left hip   Discharge Diagnoses:  Status post left total hip arthroplasty Nausea  Hypotension  Past Medical History:  Diagnosis Date   AICD (automatic cardioverter/defibrillator) present 2006   ICD   Anterior myocardial infarction (HCC)    BCC (basal cell carcinoma) superficial 11/22/2016   Left back sholder   Chronic systolic heart failure (HCC)    Coronary atherosclerosis of native coronary artery    a. s/p BMS to LAD and OM1 b. 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   right temple   Squamous cell carcinoma in situ (SCCIS) 08/04/2006   right sholder   Squamous cell carcinoma of skin 11/09/2020   in situ- left upper back   Squamous cell carcinoma of skin 11/09/2020   KA-right dorsal hand   Stroke [facility])     Surgeries: Procedure(s): LEFT TOTAL HIP ARTHROPLASTY ANTERIOR APPROACH on 11/24/2023   Consultants:   Discharged Condition: Improved  Hospital Course: [name] is an 73 y.o. male who was admitted 11/24/2023 for operative treatment ofUnilateral primary osteoarthritis, left hip. Patient has severe unremitting pain that affects sleep, daily activities, and work/hobbies. After pre-op clearance the patient was taken to the operating room on 11/24/2023 and underwent  Procedure(s): LEFT TOTAL HIP ARTHROPLASTY  ANTERIOR APPROACH.    Patient was given perioperative antibiotics:  Anti-infectives (From admission, onward)    Start     Dose/Rate Route Frequency Ordered Stop   11/24/23 1600  ceFAZolin (ANCEF) IVPB 2g/100 mL premix        2 g 200 mL/hr over 30 Minutes Intravenous Every 6 hours 11/24/23 1224 11/24/23 2247   11/24/23 0730  ceFAZolin (ANCEF) IVPB 2g/100 mL premix        2 g 200 mL/hr over 30 Minutes Intravenous On call to O.R. 11/24/23 1027 11/24/23 0945        Patient was given sequential compression devices, early ambulation, and chemoprophylaxis to prevent DVT.  Patient benefited maximally from hospital stay and there were no complications.  Hospital stay extended due to hypotension which was likely pain medication related and nausea.   Recent vital signs: Patient Vitals for the past 24 hrs:  BP Temp Temp src Pulse Resp SpO2  11/28/23 0610 98/64 97.8 F (36.6 C) Oral 77 16 94 %  11/27/23 2137 95/66 98.3 F (36.8 C) -- 82 16 98 %  11/27/23 1341 99/69 97.8 F (36.6 C) Oral 91 16 98 %     Recent laboratory studies:  Recent Labs    11/27/23 0305 11/28/23 0336  WBC 8.4 7.5  HGB 10.9* 10.6*  HCT 34.3* 33.7*  PLT 108* 118*  INR 2.1* 2.0*     Discharge Medications:   Allergies as of 11/28/2023   No Known Allergies      Medication List  TAKE these medications    aspirin EC 81 MG tablet Take 81 mg by mouth in the morning.   bisoprolol 5 MG tablet Commonly known as: ZEBETA TAKE 1 TABLET (5 MG TOTAL) BY MOUTH DAILY. What changed: when to take this   dicyclomine 10 MG capsule Commonly known as: BENTYL TAKE 1 CAPSULE (10 MG TOTAL) BY MOUTH 3 (THREE) TIMES DAILY BEFORE MEALS. FOR FREQUENT STOOLS. What changed:  when to take this reasons to take this additional instructions   enoxaparin 150 MG/ML injection Commonly known as: LOVENOX Inject 1 mL (150 mg total) into the skin daily. At 7am.   Entresto 24-26 MG Generic drug: sacubitril-valsartan Take 1  tablet by mouth 2 (two) times daily.   HYDROcodone-acetaminophen 5-325 MG tablet Commonly known as: NORCO/VICODIN Take 1-2 tablets by mouth every 6 (six) hours as needed for severe pain (pain score 7-10).   Jardiance 10 MG Tabs tablet Generic drug: empagliflozin TAKE 1 TABLET BY MOUTH EVERY DAY BEFORE BREAKFAST   nitroGLYCERIN 0.4 MG SL tablet Commonly known as: NITROSTAT Place 0.4 mg under the tongue every 5 (five) minutes x 3 doses as needed for chest pain (dissolve under tongue if no relief after 3rd dose proceed to ED or call 911).   ondansetron 4 MG tablet Commonly known as: ZOFRAN Take 1 tablet (4 mg total) by mouth every 6 (six) hours as needed for nausea.   rosuvastatin 20 MG tablet Commonly known as: CRESTOR Take 1 tablet (20 mg total) by mouth daily. What changed: when to take this   spironolactone 25 MG tablet Commonly known as: ALDACTONE TAKE 1/2 TABLET BY MOUTH EVERY DAY   warfarin 5 MG tablet Commonly known as: COUMADIN Take as directed. If you are unsure how to take this medication, talk to your nurse or doctor. Original instructions: TAKE 1/2 TABLET DAILY EXCEPT 1 TABLET ON TUESDAYS AND SATURDAYS OR AS DIRECTED               Durable Medical Equipment  (From admission, onward)           Start     Ordered   11/24/23 1339  DME 3 n 1  Once        11/24/23 1339   11/24/23 1339  DME Walker rolling  Once       Question Answer Comment  Walker: With 5 Inch Wheels   Patient needs a walker to treat with the following condition Status post total replacement of left hip      11/24/23 1339            Diagnostic Studies: DG Pelvis Portable Result Date: 11/24/2023 CLINICAL DATA:  Postop left hip surgery. EXAM: PORTABLE PELVIS 1-2 VIEWS COMPARISON:  None Available. FINDINGS: Left hip arthroplasty in expected alignment. No periprosthetic lucency or fracture. Recent postsurgical change includes air and edema in the soft tissues. Lateral skin staples in  place. IMPRESSION: Left hip arthroplasty without immediate postoperative complication. Electronically Signed   By: Narda Rutherford M.D.   On: 11/24/2023 12:37   DG HIP UNILAT WITH PELVIS 1V LEFT Result Date: 11/24/2023 CLINICAL DATA:  Elective surgery. EXAM: DG HIP (WITH OR WITHOUT PELVIS) 1V*L* COMPARISON:  None Available. FINDINGS: Three fluoroscopic spot views of the pelvis and left hip obtained in the operating room. Images during hip arthroplasty. Fluoroscopy time 18 seconds. Dose 3.5 mg. IMPRESSION: Procedural fluoroscopy during left hip arthroplasty. Electronically Signed   By: Narda Rutherford M.D.   On: 11/24/2023 11:21   DG  C-Arm 1-60 Min-No Report Result Date: 11/24/2023 Fluoroscopy was utilized by the requesting physician.  No radiographic interpretation.   DG C-Arm 1-60 Min-No Report Result Date: 11/24/2023 Fluoroscopy was utilized by the requesting physician.  No radiographic interpretation.    Disposition: Discharge disposition: 06-Home-Health Care Svc          Follow-up Information     Kathryne Hitch, MD Follow up in 2 week(s).   Specialty: Orthopedic Surgery Contact information: [address] [place] [address] [phone]         [facility] Follow up.   Why: Dispensing optician information: Leta Speller [place] [address] [phone]                  Signed: Richardean Canal 11/28/2023, 8:00 AM

## 2023-11-28 NOTE — Progress Notes (Signed)
 Physical Therapy Treatment Patient Details Name: [name] MRN: [id number] DOB: [birth year] Today's Date: 11/28/2023   History of Present Illness 73 yo male presents to therapy s/p L THA, anterior approach on 11/24/2023 due to failure of conservative measures. Pt PMH includes but is not limited to: OSA, angina, HLD, lacunar syndromes, cerebral artery occulusion with infract, cardioverter-defibrillator in situ, sCHF, A-fib, and HTN.    PT Comments  POD # 4 am session Pt progressing better with less nausea and stable BP's.  Pt was OOB in recliner.  Assisted with amb in hallway.  General transfer comment: less VC's needed this session.  Increased time/effort mainly due to his height. General Gait Details: pt tolerated a functional distance with NO c/o's.  BP 121/84 and less lean on walker.  Also present with increased step length as well as increased WBing tolerance with 6/10 pain. General stair comments: practiced up/down ONE step with walker no rails at Contact Guard Assist and <25% VC's on proper sequencing. Then returned to room to perform some TE's following HEP handout.  Instructed on proper tech, freq as well as use of ICE.   Addressed all mobility questions, discussed appropriate activity, educated on use of ICE.  Pt ready for D/C to home.    If plan is discharge home, recommend the following: A little help with walking and/or transfers;A little help with bathing/dressing/bathroom;Assistance with cooking/housework;Assist for transportation;Help with stairs or ramp for entrance   Can travel by private vehicle        Equipment Recommendations  Rolling walker (2 wheels)    Recommendations for Other Services       Precautions / Restrictions Precautions Precautions: Fall Precaution/Restrictions Comments: monitor BP (Cardiology likes it low) Restrictions Weight Bearing Restrictions Per Provider Order: No LLE Weight Bearing Per Provider Order: Weight bearing as tolerated      Mobility  Bed Mobility               General bed mobility comments: OOB in recliner    Transfers Overall transfer level: Needs assistance Equipment used: Rolling walker (2 wheels)   Sit to Stand: Supervision, Contact guard assist           General transfer comment: less VC's needed this session.  Increased time/effort mainly due to his height.    Ambulation/Gait Ambulation/Gait assistance: Supervision Gait Distance (Feet): 110 Feet Assistive device: Rolling walker (2 wheels) Gait Pattern/deviations: Step-to pattern, Decreased stance time - left, Antalgic Gait velocity: decreased     General Gait Details: pt tolerated a functional distance with NO c/o's.  BP 121/84 and less lean on walker.  Also present with increased step length as well as increased WBing tolerance with 6/10 pain.   Stairs Stairs: Yes Stairs assistance: Contact guard assist Stair Management: No rails, Step to pattern, Forwards, With walker Number of Stairs: 1 General stair comments: practiced up/down ONE step with walker no rails at Contact Guard Assist and <25% VC's on proper sequencing.   Wheelchair Mobility     Tilt Bed    Modified Rankin (Stroke Patients Only)       Balance                                            Communication    Cognition Arousal: Alert     PT - Cognitive impairments: No apparent impairments  PT - Cognition Comments: AxO x 3 pleasant and motivated.  Lives home with Spouse.  One step to enter home and bedroom/shower is upstairs.  Pt plans to live downstairs "at first".  Pt will have HH PT.  Pt can practice a flight of stairs with HH PT.        Cueing Cueing Techniques: Verbal cues  Exercises  Total Knee Replacement TE's following HEP handout 10 reps B LE ankle pumps 05 reps towel squeezes 05 reps knee presses 05 reps heel slides  05 reps SAQ's 05 reps SLR's 05 reps ABD Educated on use of gait  belt to assist with TE's Followed by ICE     General Comments        Pertinent Vitals/Pain Pain Assessment Pain Score: 3  Pain Location: L hip Pain Descriptors / Indicators: Aching, Operative site guarding Pain Intervention(s): Monitored during session, Premedicated before session, Repositioned, Ice applied    Home Living                          Prior Function            PT Goals (current goals can now be found in the care plan section) Progress towards PT goals: Progressing toward goals    Frequency    7X/week      PT Plan      Co-evaluation              AM-PAC PT "6 Clicks" Mobility   Outcome Measure  Help needed turning from your back to your side while in a flat bed without using bedrails?: A Little Help needed moving from lying on your back to sitting on the side of a flat bed without using bedrails?: A Little Help needed moving to and from a bed to a chair (including a wheelchair)?: A Little Help needed standing up from a chair using your arms (e.g., wheelchair or bedside chair)?: A Little Help needed to walk in hospital room?: A Little Help needed climbing 3-5 steps with a railing? : A Little 6 Click Score: 18    End of Session Equipment Utilized During Treatment: Gait belt Activity Tolerance: Patient tolerated treatment well Patient left: in chair;with call bell/phone within reach;with family/visitor present Nurse Communication: Mobility status PT Visit Diagnosis: Other abnormalities of gait and mobility (R26.89);Muscle weakness (generalized) (M62.81);Pain Pain - Right/Left: Left Pain - part of body: Hip     Time: 0454-0981 PT Time Calculation (min) (ACUTE ONLY): 26 min  Charges:    $Gait Training: 8-22 mins $Therapeutic Exercise: 8-22 mins PT General Charges $$ ACUTE PT VISIT: 1 Visit                    Felecia Shelling  PTA Acute  Rehabilitation Services Office M-F          [phone]

## 2023-11-28 NOTE — Progress Notes (Signed)
 Subjective: 4 Days Post-Op Procedure(s) (LRB): LEFT TOTAL HIP ARTHROPLASTY ANTERIOR APPROACH (Left) Patient reports pain as mild.  Feels the Norco is better tolerated. No nausea this AM. Wanting to discharge home later today.   Objective: Vital signs in last 24 hours: Temp:  [97.8 F (36.6 C)-98.3 F (36.8 C)] 97.8 F (36.6 C) (03/11 0610) Pulse Rate:  [77-91] 77 (03/11 0610) Resp:  [16] 16 (03/11 0610) BP: (95-99)/(64-69) 98/64 (03/11 0610) SpO2:  [94 %-98 %] 94 % (03/11 0610)  Intake/Output from previous day: 03/10 0701 - 03/11 0700 In: 360 [P.O.:360] Out: 1450 [Urine:1450] Intake/Output this shift: No intake/output data recorded.  Recent Labs    11/26/23 0353 11/27/23 0305 11/28/23 0336  HGB 11.8* 10.9* 10.6*   Recent Labs    11/27/23 0305 11/28/23 0336  WBC 8.4 7.5  RBC 3.47* 3.32*  HCT 34.3* 33.7*  PLT 108* 118*   No results for input(s): "NA", "K", "CL", "CO2", "BUN", "CREATININE", "GLUCOSE", "CALCIUM" in the last 72 hours. Recent Labs    11/27/23 0305 11/28/23 0336  INR 2.1* 2.0*    Sensation intact distally Intact pulses distally Dorsiflexion/Plantar flexion intact Incision: scant drainage Compartment soft   Assessment/Plan: 4 Days Post-Op Procedure(s) (LRB): LEFT TOTAL HIP ARTHROPLASTY ANTERIOR APPROACH (Left) Up with therapy Discharge home with home health if patient does well with PT and remains stable.       Nalini Alcaraz 11/28/2023, 7:57 AM

## 2023-11-28 NOTE — Progress Notes (Signed)
 AVS reviewed w/ pt & wife - both verbalized an understanding. PIV removed as noted. Pt dressing for d/c to home. Current dressing marked where drainage was noted. Per wife, there did not seem to be an increase from yesterday. Pt  will follow up in am with nurse who checks his INR levels to determine Coumadin dosing. Meds given noted on AVS- Contact info also witten in pen on AVS for [facility]- arranged by ortho office

## 2023-11-28 NOTE — Progress Notes (Signed)
 Patient discharged to home w/ family. Given all belongings, instructions, equipment. Escorted to pov via w/c.

## 2023-11-29 ENCOUNTER — Ambulatory Visit: Attending: Cardiovascular Disease

## 2023-11-29 ENCOUNTER — Ambulatory Visit: Payer: Commercial Managed Care - PPO | Attending: Cardiology | Admitting: *Deleted

## 2023-11-29 DIAGNOSIS — I6350 Cerebral infarction due to unspecified occlusion or stenosis of unspecified cerebral artery: Secondary | ICD-10-CM | POA: Diagnosis not present

## 2023-11-29 DIAGNOSIS — I4891 Unspecified atrial fibrillation: Secondary | ICD-10-CM

## 2023-11-29 DIAGNOSIS — Z95810 Presence of automatic (implantable) cardiac defibrillator: Secondary | ICD-10-CM

## 2023-11-29 DIAGNOSIS — I5022 Chronic systolic (congestive) heart failure: Secondary | ICD-10-CM

## 2023-11-29 DIAGNOSIS — Z5181 Encounter for therapeutic drug level monitoring: Secondary | ICD-10-CM

## 2023-11-29 LAB — POCT INR: INR: 2 (ref 2.0–3.0)

## 2023-11-29 NOTE — Progress Notes (Signed)
 EPIC Encounter for ICM Monitoring  Patient Name: [name] is a 73 y.o. male Date: 11/29/2023 Primary Care Physican: Donetta Potts, MD Primary Cardiologist: Diona Browner Electrophysiologist: Mealor 02/01/2023 Weight: 218 lbs 06/30/2023 Weight: 221 lbs 09/12/2023 Weight: 221 lbs  11/21/2023 Weight: 221-223 lbs   Spoke with patient and heart failure questions reviewed.  Transmission results reviewed.  Pt reports he is doing well after total hip replacement on 3/7 but having some pain.  He does not think he has any fluid at this time.      [place] Thoracic impedance suggesting possible fluid accumulation starting 3/2 (correlates with surgery) and returned close to baseline 3/11.     Prescribed:  Spironolactone 25 mg take 0.5 tablet (12.5 mg total) daily. Jardiance 10 mg take 1 tablet by mouth before breakfast   Recommendations:   No changes and encouraged to call if experiencing any fluid symptoms.   Follow-up plan: ICM clinic phone appointment on 12/25/2023.   91 day device clinic remote transmission 12/04/2023.       EP/Cardiology Office visits:  07/26/2024 with Dr Nelly Laurence.    12/19/2023 with Dr Diona Browner.   Copy of ICM check sent to Dr. Nelly Laurence.   3 month ICM trend: 11/28/2023.    12-14 Month ICM trend:     Karie Soda, RN 11/29/2023 3:06 PM

## 2023-11-29 NOTE — Patient Instructions (Signed)
 Take warfarin 1 tablet tonight then resume 1/2 tablet daily except 1 tablet on Tuesdays and Saturdays.  S/P hip surgery 3/7   Stop Lovenox Recheck INR in 1 wk

## 2023-11-30 ENCOUNTER — Other Ambulatory Visit: Payer: Self-pay | Admitting: Cardiology

## 2023-12-04 ENCOUNTER — Ambulatory Visit (INDEPENDENT_AMBULATORY_CARE_PROVIDER_SITE_OTHER): Payer: BC Managed Care – PPO

## 2023-12-04 DIAGNOSIS — I4811 Longstanding persistent atrial fibrillation: Secondary | ICD-10-CM

## 2023-12-05 ENCOUNTER — Telehealth: Payer: Self-pay | Admitting: Cardiology

## 2023-12-05 ENCOUNTER — Ambulatory Visit: Attending: Cardiology | Admitting: *Deleted

## 2023-12-05 DIAGNOSIS — I6350 Cerebral infarction due to unspecified occlusion or stenosis of unspecified cerebral artery: Secondary | ICD-10-CM

## 2023-12-05 DIAGNOSIS — Z5181 Encounter for therapeutic drug level monitoring: Secondary | ICD-10-CM

## 2023-12-05 DIAGNOSIS — I4891 Unspecified atrial fibrillation: Secondary | ICD-10-CM | POA: Diagnosis not present

## 2023-12-05 LAB — POCT INR: INR: 1.9 — AB (ref 2.0–3.0)

## 2023-12-05 NOTE — Telephone Encounter (Signed)
 Pt came into [place] office and expressed that he has low blood pressure. He is getting dizzy when pushing his walker. He would like for someone to call him.

## 2023-12-05 NOTE — Patient Instructions (Signed)
 Increase warfarin to 1/2 tablet daily except 1 tablet on Tuesdays, Thursdays and Saturdays.  S/P hip surgery 3/7   Recheck INR in 1 wk

## 2023-12-06 LAB — CUP PACEART REMOTE DEVICE CHECK
Battery Remaining Longevity: 48 mo
Battery Remaining Percentage: 46 %
Battery Voltage: 2.93 V
Brady Statistic RV Percent Paced: 1 %
Date Time Interrogation Session: 20250319021707
HighPow Impedance: 46 Ohm
HighPow Impedance: 46 Ohm
Implantable Lead Connection Status: 753985
Implantable Lead Implant Date: 20020111
Implantable Lead Location: 753860
Implantable Lead Model: 148
Implantable Lead Serial Number: 116740
Implantable Pulse Generator Implant Date: 20180503
Lead Channel Impedance Value: 680 Ohm
Lead Channel Pacing Threshold Amplitude: 1.25 V
Lead Channel Pacing Threshold Pulse Width: 0.5 ms
Lead Channel Sensing Intrinsic Amplitude: 12 mV
Lead Channel Setting Pacing Amplitude: 2.5 V
Lead Channel Setting Pacing Pulse Width: 0.5 ms
Lead Channel Setting Sensing Sensitivity: 0.5 mV
Pulse Gen Serial Number: 7421148

## 2023-12-06 NOTE — Telephone Encounter (Signed)
 Spoke to patient who stated that he just had a hip replacement and was only supposed to stay overnight for observation but ended up staying for 3 nights d/t each time he stood up and walked his bp dropped and he felt dizzy and lightheaded. Pt stated that his bp is always low and is unsure if his medication needs to be readjusted. Pt to start PT today. Pt stated that his intake of fluid is good. Pt stated that he is unable to take bp at home d/t not having a bp cuff anymore. Pt had appt with provider on 04/01. Pt advised to keep appt.    Please advise.

## 2023-12-06 NOTE — Telephone Encounter (Signed)
 Patient notified and verbalized understanding. Patient had no further questions or concerns at this time.

## 2023-12-07 ENCOUNTER — Ambulatory Visit: Payer: Commercial Managed Care - PPO | Admitting: Orthopaedic Surgery

## 2023-12-07 DIAGNOSIS — Z96642 Presence of left artificial hip joint: Secondary | ICD-10-CM

## 2023-12-07 NOTE — Progress Notes (Signed)
 The patient is a 73 year old who is here for his first postoperative visit status post a left total hip replacement 2 weeks ago to treat significant left hip pain and arthritis.  He is ambulating with a walker today but his home therapist is going to transition him to a cane tomorrow.  He is not taking narcotics.  He is on chronic Coumadin.  His left hip incision looks good.  The staples are removed and Steri-Strips applied.  There is no significant swelling.  His leg lengths appear near equal.  He has pretty good range of motion of that hip.  From my standpoint he will continue to increase his activities as comfort allows.  He can drive.  We will see him back in 4 weeks to see how he is doing overall for mobility standpoint but no x-rays are needed.

## 2023-12-12 ENCOUNTER — Encounter: Payer: Self-pay | Admitting: Cardiovascular Disease

## 2023-12-13 ENCOUNTER — Ambulatory Visit: Attending: Cardiology | Admitting: *Deleted

## 2023-12-13 DIAGNOSIS — Z5181 Encounter for therapeutic drug level monitoring: Secondary | ICD-10-CM | POA: Diagnosis not present

## 2023-12-13 DIAGNOSIS — I4891 Unspecified atrial fibrillation: Secondary | ICD-10-CM | POA: Diagnosis not present

## 2023-12-13 DIAGNOSIS — I6350 Cerebral infarction due to unspecified occlusion or stenosis of unspecified cerebral artery: Secondary | ICD-10-CM

## 2023-12-13 LAB — POCT INR: INR: 2.1 (ref 2.0–3.0)

## 2023-12-13 NOTE — Patient Instructions (Signed)
 Continue warfarin 1/2 tablet daily except 1 tablet on Tuesdays, Thursdays and Saturdays.  S/P hip surgery 3/7   Recheck INR in 3 wks

## 2023-12-19 ENCOUNTER — Ambulatory Visit: Payer: Commercial Managed Care - PPO | Attending: Cardiology | Admitting: Cardiology

## 2023-12-19 ENCOUNTER — Encounter: Payer: Self-pay | Admitting: Cardiology

## 2023-12-19 VITALS — BP 98/58 | HR 55 | Ht 72.0 in | Wt 215.2 lb

## 2023-12-19 DIAGNOSIS — I5020 Unspecified systolic (congestive) heart failure: Secondary | ICD-10-CM | POA: Diagnosis not present

## 2023-12-19 DIAGNOSIS — I255 Ischemic cardiomyopathy: Secondary | ICD-10-CM | POA: Diagnosis not present

## 2023-12-19 DIAGNOSIS — Z95810 Presence of automatic (implantable) cardiac defibrillator: Secondary | ICD-10-CM

## 2023-12-19 DIAGNOSIS — I4819 Other persistent atrial fibrillation: Secondary | ICD-10-CM

## 2023-12-19 MED ORDER — ENTRESTO 24-26 MG PO TABS: 1.0000 | ORAL_TABLET | Freq: Two times a day (BID) | ORAL | Status: DC

## 2023-12-19 NOTE — Progress Notes (Signed)
 Cardiology Office Note  Date: 12/19/2023   ID: [name], DOB 1950/12/11, MRN [id number]  History of Present Illness: [name] is a 73 y.o. male last seen in October 2024.  He is here for a follow-up visit.  We discussed his interval history, he underwent recent left total hip arthroplasty in March.  He has been undergoing home PT which she is tolerating, although has had relative weakness and orthostatic dizziness.  Blood pressures have been lower than his typical with systolics in the 80s to 90s at times.  He does admit that his appetite has not been as good, his weight is down 7 or 8 pounds over the last month.  He does not report any orthopnea or PND.  No angina.  We went over his medications today.  He remains on Coumadin with follow-up in the anticoagulation clinic.  Recent INR 2.1.  We discussed holding Entresto temporarily.  My hope is that with increasing activity and improvement in his appetite, blood pressure will trend back into prior range and we can resume Entresto.  I spoke with his daughter by phone today during the visit as well.  St. 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 Nelly Laurence, I reviewed his note from November 2024.  Device interrogation in March indicated normal function.  He reports no device shocks, no syncope.  Thoracic impedance from March reviewed.  Physical Exam: VS:  BP (!) 98/58   Pulse (!) 55   Ht 6' (1.829 m)   Wt 215 lb 3.2 oz (97.6 kg)   SpO2 96%   BMI 29.19 kg/m , BMI Body mass index is 29.19 kg/m.  Wt Readings from Last 3 Encounters:  12/19/23 215 lb 3.2 oz (97.6 kg)  11/24/23 223 lb (101.2 kg)  11/14/23 223 lb (101.2 kg)    General: Patient appears comfortable at rest. HEENT: Conjunctiva and lids normal. Neck: Supple, no elevated JVP or carotid bruits. Lungs: Clear to auscultation, nonlabored breathing at rest. Cardiac: Regular rate and rhythm, no S3 or significant systolic murmur. Extremities: No pitting edema.  ECG:  An ECG  dated 07/05/2023 was personally reviewed today and demonstrated:  Atrial fibrillation with left anterior fascicular block, old anterior infarct pattern, low voltage.  Labwork: 11/25/2023: BUN 15; Creatinine, Ser 1.04; Potassium 4.7; Sodium 135 11/28/2023: Hemoglobin 10.6; Platelets 118   Other Studies Reviewed Today:  No interval cardiac testing for review today.  Assessment and Plan:  1.  CAD status post previous anterior wall infarct in 2002 with BMS to the LAD and OM1.  Follow-up cardiac catheterization in 2019 revealed patent stent sites.  No angina at this time.  Continue aspirin 81 mg daily and as needed nitroglycerin.   2.  HFrEF with ischemic cardiomyopathy, LVEF 30 to 35% by echocardiogram in October 2023.  No formed mural thrombus noted with Definity contrast.  He has had weakness and orthostatic symptoms with lower than typical blood pressures as discussed above following knee surgery.  Weight is down and he admits that his appetite has not been as good, possibly contributing.  For now we will have him temporarily hold Entresto and otherwise continue his regimen including bisoprolol 5 mg daily, Jardiance 10 mg daily, and Aldactone 12.5 mg daily.  Continue to track blood pressure at home.  Schedule one month visit with BMET.   4.  Persistent atrial fibrillation with CHA2DS2-VASc score of 6.  He has preferred Coumadin to DOAC's and continues with follow-up in the anticoagulation clinic.  He does not report any palpitations.  Recent INR therapeutic.   5.  Mixed hyperlipidemia.  LDL 69 in May 2023.  Continue Crestor 20 mg daily.   6.  St. 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 Nelly Laurence.  No reported device shocks or syncope.  Disposition:  Follow up  1 month.  Signed, Jonelle Sidle, M.D., F.A.C.C. [facility]

## 2023-12-19 NOTE — Patient Instructions (Addendum)
 Medication Instructions:  Your physician has recommended you make the following change in your medication:  Hold Entresto Continue all other medications as prescribed  Labwork: BMET in 1 month just before your next visit at [facility] ([address] [place]) Non-fasting  Testing/Procedures: none  Follow-Up: Your physician recommends that you schedule a follow-up appointment in: 1 month  Any Other Special Instructions Will Be Listed Below (If Applicable).  If you need a refill on your cardiac medications before your next appointment, please call your pharmacy.

## 2023-12-25 ENCOUNTER — Ambulatory Visit: Attending: Cardiovascular Disease

## 2023-12-25 DIAGNOSIS — Z95810 Presence of automatic (implantable) cardiac defibrillator: Secondary | ICD-10-CM | POA: Diagnosis not present

## 2023-12-25 DIAGNOSIS — I5020 Unspecified systolic (congestive) heart failure: Secondary | ICD-10-CM | POA: Diagnosis not present

## 2023-12-26 LAB — BASIC METABOLIC PANEL WITH GFR
BUN/Creatinine Ratio: 11 (ref 10–24)
BUN: 12 mg/dL (ref 8–27)
CO2: 22 mmol/L (ref 20–29)
Calcium: 9.2 mg/dL (ref 8.6–10.2)
Chloride: 98 mmol/L (ref 96–106)
Creatinine, Ser: 1.14 mg/dL (ref 0.76–1.27)
Glucose: 103 mg/dL — ABNORMAL HIGH (ref 70–99)
Potassium: 4.2 mmol/L (ref 3.5–5.2)
Sodium: 136 mmol/L (ref 134–144)
eGFR: 68 mL/min/{1.73_m2} (ref >59)

## 2023-12-26 NOTE — Progress Notes (Signed)
 EPIC Encounter for ICM Monitoring  Patient Name: [name] is a 73 y.o. male Date: 12/26/2023 Primary Care Physican: Donetta Potts, MD Primary Cardiologist: Diona Browner Electrophysiologist: Mealor 02/01/2023 Weight: 218 lbs 06/30/2023 Weight: 221 lbs 09/12/2023 Weight: 221 lbs  11/21/2023 Weight: 221-223 lbs 12/19/2023 Office Weight: 215 lbs 12/26/2023 Weight: 214 lbs   Spoke with patient and heart failure questions reviewed.  Transmission results reviewed.  Pt asymptomatic for fluid accumulation.  His reports BP has been running low and saw Dr Diona Browner.  He may be drinking more than the recommended amount.   Corvue Thoracic impedance suggesting possible fluid accumulation starting 4/4.     Prescribed:  Spironolactone 25 mg take 0.5 tablet (12.5 mg total) daily. Jardiance 10 mg take 1 tablet by mouth before breakfast   Recommendations:  Encouraged limit fluid intake.     Follow-up plan: ICM clinic phone appointment on 01/01/2024 to recheck fluid levels.  91 day device clinic remote transmission 03/04/2024.       EP/Cardiology Office visits:  07/26/2024 with Dr Nelly Laurence.    01/17/2024 with Dr Diona Browner.   Copy of ICM check sent to Dr. Nelly Laurence.  Copy sent to Dr Diona Browner for review.    3 month ICM trend: 12/25/2023.    12-14 Month ICM trend:     Karie Soda, RN 12/26/2023 9:13 AM

## 2023-12-27 ENCOUNTER — Encounter: Payer: Self-pay | Admitting: Cardiology

## 2023-12-29 ENCOUNTER — Other Ambulatory Visit: Payer: Self-pay | Admitting: Cardiology

## 2024-01-01 ENCOUNTER — Ambulatory Visit: Attending: Cardiovascular Disease

## 2024-01-01 DIAGNOSIS — I5022 Chronic systolic (congestive) heart failure: Secondary | ICD-10-CM

## 2024-01-01 DIAGNOSIS — Z95810 Presence of automatic (implantable) cardiac defibrillator: Secondary | ICD-10-CM

## 2024-01-03 ENCOUNTER — Encounter

## 2024-01-03 NOTE — Progress Notes (Signed)
 EPIC Encounter for ICM Monitoring  Patient Name: [name] is a 73 y.o. male Date: 01/03/2024 Primary Care Physican: Lauran Pollard, MD Primary Cardiologist: Londa Rival Electrophysiologist: Mealor 02/01/2023 Weight: 218 lbs 06/30/2023 Weight: 221 lbs 09/12/2023 Weight: 221 lbs  11/21/2023 Weight: 221-223 lbs 12/19/2023 Office Weight: 215 lbs 12/26/2023 Weight: 214 lbs   Spoke with patient and heart failure questions reviewed.  Transmission results reviewed.  Pt asymptomatic for fluid accumulation.  He does not have a lot of energy since hip surgery but PT has released him since he is doing well with recovering.   Corvue Thoracic impedance suggesting fluid levels returned to normal 4/10.     Prescribed:  Spironolactone 25 mg take 0.5 tablet (12.5 mg total) daily. Jardiance 10 mg take 1 tablet by mouth before breakfast   Recommendations:  No changes and encouraged to call if experiencing any fluid symptoms.   Follow-up plan: ICM clinic phone appointment on 01/29/2024.  91 day device clinic remote transmission 03/04/2024.       EP/Cardiology Office visits:  07/26/2024 with Dr Arlester Ladd.    01/17/2024 with Dr Londa Rival.   Copy of ICM check sent to Dr. Arlester Ladd.   3 month ICM trend: 01/01/2024.    12-14 Month ICM trend:     Almyra Jain, RN 01/03/2024 7:38 AM

## 2024-01-04 ENCOUNTER — Ambulatory Visit (INDEPENDENT_AMBULATORY_CARE_PROVIDER_SITE_OTHER): Admitting: Orthopaedic Surgery

## 2024-01-04 ENCOUNTER — Encounter: Payer: Self-pay | Admitting: Orthopaedic Surgery

## 2024-01-04 DIAGNOSIS — Z96642 Presence of left artificial hip joint: Secondary | ICD-10-CM

## 2024-01-04 NOTE — Progress Notes (Signed)
 The patient is a 73 year old gentleman who is now 6 weeks status post a left total hip arthroplasty to treat severe left hip arthritis.  He does ambulate with a cane.  He has been released from therapy.  He says he has lost about 11 to 12 pounds in surgery and he does not have an appetite.  He does see his primary care physician soon.  We encouraged increased protein intake.  He says overall though he is doing well.  His left hip does move smoothly and fluidly.  He is ambulating slowly with a cane.  His right hip still has no issues.  He does have some arthritis in that hip but he is asymptomatic.  Is nothing like the severe arthritis from his left hip though.  My standpoint the next time I need to see him is not for 6 months unless he is having issues.  Will have a standing order the pelvis and lateral

## 2024-01-12 ENCOUNTER — Telehealth: Payer: Self-pay | Admitting: Orthopaedic Surgery

## 2024-01-12 NOTE — Telephone Encounter (Signed)
 Called and advised pt.

## 2024-01-12 NOTE — Telephone Encounter (Signed)
 Patient called. He would like to know if he could have a colonoscopy? Also would like to know about having dental work done as well. His cb# [phone]

## 2024-01-17 ENCOUNTER — Encounter: Payer: Self-pay | Admitting: Cardiology

## 2024-01-17 ENCOUNTER — Ambulatory Visit: Attending: Cardiology | Admitting: Cardiology

## 2024-01-17 VITALS — BP 104/68 | HR 74 | Ht 73.0 in | Wt 210.0 lb

## 2024-01-17 DIAGNOSIS — I25119 Atherosclerotic heart disease of native coronary artery with unspecified angina pectoris: Secondary | ICD-10-CM

## 2024-01-17 DIAGNOSIS — I5020 Unspecified systolic (congestive) heart failure: Secondary | ICD-10-CM

## 2024-01-17 DIAGNOSIS — I4819 Other persistent atrial fibrillation: Secondary | ICD-10-CM

## 2024-01-17 DIAGNOSIS — I5022 Chronic systolic (congestive) heart failure: Secondary | ICD-10-CM

## 2024-01-17 DIAGNOSIS — Z95810 Presence of automatic (implantable) cardiac defibrillator: Secondary | ICD-10-CM | POA: Diagnosis not present

## 2024-01-17 MED ORDER — LOSARTAN POTASSIUM 25 MG PO TABS: 12.5000 mg | ORAL_TABLET | Freq: Every day | ORAL | 1 refills | Status: DC

## 2024-01-17 NOTE — Progress Notes (Signed)
 Cardiology Office Note  Date: 01/17/2024   ID: [name], DOB [birth year], MRN [id number]  History of Present Illness: [name] is a 73 y.o. male seen in April.  He is here for a routine visit.  Since last encounter he feels like his recovery from hip surgery has been going well.  Having said that he does note more dyspnea on exertion, short of breath walking in from the parking lot today, NYHA class III symptoms.  He does not report any angina or nitroglycerin  use, no fluid retention or weight gain.  We went over his medications today.  He remains off Entresto .  Blood pressure better than at last visit but still low normal.  He has been able to stay on the remainder of his cardiac medications.  St. 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 Arlester Ladd.  Device interrogation in March indicated normal function.  He does not report any device shocks or syncope.  He remains on Coumadin  with follow-up in the anticoagulation clinic.  Last INR 2.1.  Today we discussed getting a follow-up echocardiogram and I would also like to have him evaluated in the heart failure clinic.  We we will try to put him on low-dose losartan  to see if he tolerates that first rather than starting straight back on Entresto  given his recent blood pressures.  Physical Exam: VS:  BP 104/68 (BP Location: Left Arm, Patient Position: Sitting, Cuff Size: Normal)   Pulse 74   Ht 6\' 1"  (1.854 m)   Wt 210 lb (95.3 kg)   SpO2 97%   BMI 27.71 kg/m , BMI Body mass index is 27.71 kg/m.  Wt Readings from Last 3 Encounters:  01/17/24 210 lb (95.3 kg)  12/19/23 215 lb 3.2 oz (97.6 kg)  11/24/23 223 lb (101.2 kg)    General: Patient appears comfortable at rest. HEENT: Conjunctiva and lids normal. Neck: Supple, no elevated JVP or carotid bruits. Lungs: Clear to auscultation, nonlabored breathing at rest. Cardiac: Regular rate and rhythm, no S3 or significant systolic murmur, no pericardial rub. Extremities: No  pitting edema.  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  Labwork: 11/28/2023: Hemoglobin 10.6; Platelets 118 12/25/2023: BUN 12; Creatinine, Ser 1.14; Potassium 4.2; Sodium 136   Other Studies Reviewed Today:  No interval cardiac testing for review today.  Assessment and Plan:  1.  CAD status post previous anterior wall infarct in 2002 with BMS to the LAD and OM1.  Follow-up cardiac catheterization in 2019 revealed patent stent sites.  He does not describe any angina and has not taking any nitroglycerin  in the interim.  Continue aspirin  81 mg daily.   2.  HFrEF with ischemic cardiomyopathy, LVEF 30 to 35% by echocardiogram in October 2023.  No formed mural thrombus noted with Definity  contrast.  Plan to update echocardiogram.  He is reporting NYHA class III symptoms at this time, really has not quite felt the same since undergoing hip surgery.  Plan to continue bisoprolol  5 mg daily, Jardiance  10 mg daily, and Aldactone  12.5 mg daily.  Rather than starting straight back on Entresto , we will see if he tolerates losartan  12.5 mg daily first.  I have asked him to keep an eye on blood pressure and report any orthostatic symptoms.  We also discussed referral to the heart failure clinic for further evaluation in light of his recent symptoms.  May need to consider follow-up testing such as left/right heart catheterization and CPX.   3.  Long-term persistent/permanent atrial fibrillation with CHA2DS2-VASc score of 6.  He has preferred Coumadin  to DOAC's and continues with follow-up in the anticoagulation clinic.  Heart rate control adequate on bisoprolol .   5.  Mixed hyperlipidemia.  LDL 69 in May 2023.  Continue Crestor  20 mg daily.   6.  St. 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 Arlester Ladd.  No reported device shocks or syncope.  Disposition:  Follow up  6  weeks.  Signed, Gerard Knight, M.D., F.A.C.C. [facility]

## 2024-01-17 NOTE — Patient Instructions (Addendum)
 Medication Instructions:  Your physician has recommended you make the following change in your medication:  Start losartan  12.5 mg daily Continue all other medications as prescribed  Labwork: none  Testing/Procedures: Your physician has requested that you have an echocardiogram. Echocardiography is a painless test that uses sound waves to create images of your heart. It provides your doctor with information about the size and shape of your heart and how well your heart's chambers and valves are working. This procedure takes approximately one hour. There are no restrictions for this procedure. Please do NOT wear cologne, perfume, aftershave, or lotions (deodorant is allowed). Please arrive 15 minutes prior to your appointment time.  Please note: We ask at that you not bring children with you during ultrasound (echo/ vascular) testing. Due to room size and safety concerns, children are not allowed in the ultrasound rooms during exams. Our front office staff cannot provide observation of children in our lobby area while testing is being conducted. An adult accompanying a patient to their appointment will only be allowed in the ultrasound room at the discretion of the ultrasound technician under special circumstances. We apologize for any inconvenience.  Follow-Up: Your physician recommends that you schedule a follow-up appointment in: 6 weeks  Any Other Special Instructions Will Be Listed Below (If Applicable). You have been referred to Heart Failure Clinic  If you need a refill on your cardiac medications before your next appointment, please call your pharmacy.

## 2024-01-22 ENCOUNTER — Telehealth (HOSPITAL_COMMUNITY): Payer: Self-pay | Admitting: Internal Medicine

## 2024-01-22 ENCOUNTER — Ambulatory Visit: Attending: Cardiology | Admitting: *Deleted

## 2024-01-22 ENCOUNTER — Ambulatory Visit: Attending: Cardiology

## 2024-01-22 DIAGNOSIS — I6350 Cerebral infarction due to unspecified occlusion or stenosis of unspecified cerebral artery: Secondary | ICD-10-CM | POA: Diagnosis not present

## 2024-01-22 DIAGNOSIS — I4891 Unspecified atrial fibrillation: Secondary | ICD-10-CM | POA: Diagnosis not present

## 2024-01-22 DIAGNOSIS — Z5181 Encounter for therapeutic drug level monitoring: Secondary | ICD-10-CM | POA: Diagnosis not present

## 2024-01-22 DIAGNOSIS — I5020 Unspecified systolic (congestive) heart failure: Secondary | ICD-10-CM

## 2024-01-22 LAB — POCT INR: INR: 3.4 — AB (ref 2.0–3.0)

## 2024-01-22 MED ORDER — PERFLUTREN LIPID MICROSPHERE
1.0000 mL | INTRAVENOUS | Status: AC | PRN
  Administered 2024-01-22: 7 mL via INTRAVENOUS

## 2024-01-22 NOTE — Telephone Encounter (Signed)
 Called to confirm/remind patient of their appointment at the [facility] on 01/22/24 .   Appointment:   [x] Confirmed  [] Left mess   [] No answer/No voice mail  [] VM Full/unable to leave message  [] Phone not in service  Patient reminded to bring all medications and/or complete list.  Confirmed patient has transportation. Gave directions, instructed to utilize valet parking.

## 2024-01-22 NOTE — Progress Notes (Signed)
 Remote ICD transmission.

## 2024-01-22 NOTE — Addendum Note (Signed)
 Addended by: Edra Govern D on: 01/22/2024 02:35 PM   Modules accepted: Orders

## 2024-01-22 NOTE — Patient Instructions (Signed)
 Hold warfarin tonight then resume 1/2 tablet daily except 1 tablet on Tuesdays, Thursdays and Saturdays.  S/P hip surgery 3/7   Recheck INR in 3 wks

## 2024-01-23 ENCOUNTER — Ambulatory Visit (HOSPITAL_COMMUNITY)
Admission: RE | Admit: 2024-01-23 | Discharge: 2024-01-23 | Disposition: A | Discharge status: Home / Self Care | Source: Ambulatory Visit | Attending: Internal Medicine | Admitting: Internal Medicine

## 2024-01-23 VITALS — BP 100/60 | HR 95 | Wt 210.0 lb

## 2024-01-23 DIAGNOSIS — Z7901 Long term (current) use of anticoagulants: Secondary | ICD-10-CM | POA: Diagnosis not present

## 2024-01-23 DIAGNOSIS — Z7982 Long term (current) use of aspirin: Secondary | ICD-10-CM | POA: Insufficient documentation

## 2024-01-23 DIAGNOSIS — I444 Left anterior fascicular block: Secondary | ICD-10-CM | POA: Insufficient documentation

## 2024-01-23 DIAGNOSIS — I110 Hypertensive heart disease with heart failure: Secondary | ICD-10-CM | POA: Diagnosis not present

## 2024-01-23 DIAGNOSIS — Z955 Presence of coronary angioplasty implant and graft: Secondary | ICD-10-CM | POA: Insufficient documentation

## 2024-01-23 DIAGNOSIS — Z8673 Personal history of transient ischemic attack (TIA), and cerebral infarction without residual deficits: Secondary | ICD-10-CM | POA: Insufficient documentation

## 2024-01-23 DIAGNOSIS — I5022 Chronic systolic (congestive) heart failure: Secondary | ICD-10-CM | POA: Insufficient documentation

## 2024-01-23 DIAGNOSIS — I493 Ventricular premature depolarization: Secondary | ICD-10-CM | POA: Insufficient documentation

## 2024-01-23 DIAGNOSIS — Z96642 Presence of left artificial hip joint: Secondary | ICD-10-CM | POA: Diagnosis not present

## 2024-01-23 DIAGNOSIS — Z95810 Presence of automatic (implantable) cardiac defibrillator: Secondary | ICD-10-CM | POA: Diagnosis not present

## 2024-01-23 DIAGNOSIS — I482 Chronic atrial fibrillation, unspecified: Secondary | ICD-10-CM | POA: Insufficient documentation

## 2024-01-23 DIAGNOSIS — I25119 Atherosclerotic heart disease of native coronary artery with unspecified angina pectoris: Secondary | ICD-10-CM | POA: Diagnosis not present

## 2024-01-23 DIAGNOSIS — I252 Old myocardial infarction: Secondary | ICD-10-CM | POA: Insufficient documentation

## 2024-01-23 DIAGNOSIS — I2510 Atherosclerotic heart disease of native coronary artery without angina pectoris: Secondary | ICD-10-CM | POA: Insufficient documentation

## 2024-01-23 DIAGNOSIS — Z79899 Other long term (current) drug therapy: Secondary | ICD-10-CM | POA: Insufficient documentation

## 2024-01-23 DIAGNOSIS — I4891 Unspecified atrial fibrillation: Secondary | ICD-10-CM | POA: Diagnosis not present

## 2024-01-23 LAB — ECHOCARDIOGRAM COMPLETE
AR max vel: 1.92 cm2
AV Area VTI: 1.89 cm2
AV Area mean vel: 1.83 cm2
AV Mean grad: 4 mmHg
AV Peak grad: 6.5 mmHg
Ao pk vel: 1.27 m/s
MV VTI: 1.95 cm2
P 1/2 time: 415 ms
S' Lateral: 3.6 cm

## 2024-01-23 MED ORDER — DIGOXIN 125 MCG PO TABS: 0.1250 mg | ORAL_TABLET | Freq: Every day | ORAL | 3 refills | Status: AC

## 2024-01-23 MED ORDER — BISOPROLOL FUMARATE 5 MG PO TABS: 2.5000 mg | ORAL_TABLET | Freq: Every evening | ORAL | Status: DC

## 2024-01-23 NOTE — Patient Instructions (Signed)
 Great to see you today!!!  Medication Changes:  Decrease Bisoprolol  to 2.5 mg (1/2 tab) Daily  START Digoxin 0.125 mg Daily  Testing/Procedures:  Your physician has recommended that you have a cardiopulmonary stress test (CPX). CPX testing is a non-invasive measurement of heart and lung function. It replaces a traditional treadmill stress test. This type of test provides a tremendous amount of information that relates not only to your present condition but also for future outcomes. This test combines measurements of you ventilation, respiratory gas exchange in the lungs, electrocardiogram (EKG), blood pressure and physical response before, during, and following an exercise protocol.  Special Instructions // Education:  Do the following things EVERYDAY: Weigh yourself in the morning before breakfast. Write it down and keep it in a log. Take your medicines as prescribed Eat low salt foods--Limit salt (sodium) to 2000 mg per day.  Stay as active as you can everyday Limit all fluids for the day to less than 2 liters   Follow-Up in: 2-3 weeks   At the [facility], you and your health needs are our priority. We have a designated team specialized in the treatment of Heart Failure. This Care Team includes your primary Heart Failure Specialized Cardiologist (physician), Advanced Practice Providers (APPs- Physician Assistants and Nurse Practitioners), and Pharmacist who all work together to provide you with the care you need, when you need it.   You may see any of the following providers on your designated Care Team at your next follow up:  Dr. Jules Oar Dr. Peder Bourdon Dr. Alwin Baars Dr. Judyth Nunnery Nieves Bars, NP Ruddy Corral, [place] [facility] [place], [place] Dennise Fitz, NP [place] Lee, NP Luster Salters, PharmD   Please be sure to bring in all your medications bottles to every appointment.   Need to Contact Us :  If you have any questions or  concerns before your next appointment please send us  a message through [place] or call our office at [phone].    TO LEAVE A MESSAGE FOR THE NURSE SELECT OPTION 2, PLEASE LEAVE A MESSAGE INCLUDING: YOUR NAME DATE OF BIRTH CALL BACK NUMBER REASON FOR CALL**this is important as we prioritize the call backs  YOU WILL RECEIVE A CALL BACK THE SAME DAY AS LONG AS YOU CALL BEFORE 4:00 PM

## 2024-01-23 NOTE — Progress Notes (Addendum)
 ADVANCED HF CLINIC CONSULT NOTE  Referring Physician: Lauran Pollard, MD Primary Care: Lauran Pollard, MD Primary Cardiologist: Teddie Favre, MD  Chief Complaint: Heart failure  HPI:  [name] is a 73 y.o. male (father of Jenise Mixer RN). He is a retired Psychologist, occupational. Referred by Dr. Londa Rival for further evalaution of his HF  Had an anterior MI in 1994 with BMS to the LAD and OM1.  Follow-up cardiac catheterization in 2019 revealed patent stent sites EF 25-35%  Underwent STJ ICD with change-out 5/18   Had remote CVA but recovered well   Had been doing well until several months ago. Had left hip replacement on 11/24/23. Post-op BP very low.   Echo 01/22/24 EF 25% mild to moderate MR severe LAE RV ok   After hip replacement he has struggled with reduced exercise capacity now gets SOB with just mild activity. No CP. No edema, orthopnea or PND  Saw Dr. 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 s/p BMS to LAD and OM1 b. 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HCC)    Ischemic cardiomyopathy    LVEF 30%, status post ICD   OSA (obstructive sleep apnea)    unable to tolerate CPAP   Paroxysmal atrial fibrillation (HCC)    On coumadin    Pseudoaneurysm (HCC)    SCC (squamous cell carcinoma) 08/04/2006   right shoulder (CX35FU)   SCC (squamous cell carcinoma) well differentiated 12/13/2011   right temple   Squamous cell carcinoma in situ (SCCIS) 08/04/2006   right sholder   Squamous cell carcinoma of skin 11/09/2020   in situ- left upper back    Squamous cell carcinoma of skin 11/09/2020   KA-right dorsal hand   Stroke [facility])     Current Outpatient Medications  Medication Sig Dispense Refill   aspirin  EC 81 MG tablet Take 81 mg by mouth in the morning.     bisoprolol  (ZEBETA ) 5 MG tablet Take 1 tablet (5 mg total) by mouth every evening. 90 tablet 1   JARDIANCE  10 MG TABS tablet TAKE 1 TABLET BY MOUTH EVERY DAY BEFORE BREAKFAST 30 tablet 11   losartan  (COZAAR ) 25 MG tablet Take 0.5 tablets (12.5 mg total) by mouth daily. 45 tablet 1   nitroGLYCERIN  (NITROSTAT ) 0.4 MG SL tablet Place 0.4 mg under the tongue every 5 (five) minutes x 3 doses as needed for chest pain (dissolve under tongue if no relief after 3rd dose proceed to ED or call 911).     rosuvastatin  (CRESTOR ) 20 MG tablet Take 1 tablet (20 mg total) by mouth daily. 90 tablet 2   spironolactone  (ALDACTONE ) 25 MG tablet TAKE 1/2 TABLET BY MOUTH EVERY DAY 45 tablet 1   warfarin (COUMADIN ) 5 MG tablet TAKE 1/2 TABLET DAILY EXCEPT 1 TABLET ON TUESDAYS AND SATURDAYS OR AS DIRECTED 90 tablet 3   No current facility-administered medications for this encounter.    No Known Allergies    Social History   Socioeconomic History   Marital status: Married  Spouse name: Not on file   Number of children: Not on file   Years of education: Not on file   Highest education level: Not on file  Occupational History   Occupation: retired    Comment: Banking  Tobacco Use   Smoking status: Never    Passive exposure: Never   Smokeless tobacco: Never  Vaping Use   Vaping status: Never Used  Substance and Sexual Activity   Alcohol  use: Yes    Comment: occasional "moderate"   Drug use: No   Sexual activity: Yes  Other Topics Concern   Not on file  Social History Narrative   Not on file   Social Drivers of Health   Financial Resource Strain: Not on file  Food Insecurity: No Food Insecurity (11/24/2023)   Hunger Vital Sign    Worried About Running Out of Food in the Last Year:  Never true    Ran Out of Food in the Last Year: Never true  Transportation Needs: No Transportation Needs (11/24/2023)   PRAPARE - Administrator, Civil Service (Medical): No    Lack of Transportation (Non-Medical): No  Physical Activity: Not on file  Stress: Not on file  Social Connections: Socially Integrated (11/24/2023)   Social Connection and Isolation Panel [NHANES]    Frequency of Communication with Friends and Family: More than three times a week    Frequency of Social Gatherings with Friends and Family: More than three times a week    Attends Religious Services: More than 4 times per year    Active Member of [employer] or Organizations: Yes    Attends Engineer, structural: More than 4 times per year    Marital Status: Married  Catering manager Violence: Not At Risk (11/24/2023)   Humiliation, Afraid, Rape, and Kick questionnaire    Fear of Current or Ex-Partner: No    Emotionally Abused: No    Physically Abused: No    Sexually Abused: No      Family History  Problem Relation Age of Onset   Cancer Mother        lymphoma    Heart attack Father    Heart disease Brother    Colon cancer Neg Hx    Colon polyps Neg Hx     Vitals:   01/23/24 1358  BP: 100/60  Pulse: 95  SpO2: 95%  Weight: 95.3 kg (210 lb)    PHYSICAL EXAM: General: Elderly No respiratory difficulty HEENT: normal Neck: supple. no JVD. Carotids 2+ bilat; no bruits. No lymphadenopathy or thryomegaly appreciated. Cor: PMI nondisplaced. Irregular rate & rhythm. No rubs, gallops or murmurs. Lungs: clear Abdomen: soft, nontender, nondistended. No hepatosplenomegaly. No bruits or masses. Good bowel sounds. Extremities: no cyanosis, clubbing, rash, edema Neuro: alert & oriented x 3, cranial nerves grossly intact. moves all 4 extremities w/o difficulty. Affect pleasant.  ECG: AF 99 anterior Qs Personally reviewed   ASSESSMENT & PLAN:  1. Chronic systolic HF due to iCM - anterior MI in 1994  with BMS to the LAD and OM1.   - Follow-up cardiac catheterization in 2019 revealed patent stent sites EF 25-35% - Underwent STJ ICD with change-out 5/18 - Interrogated today in clinic. No VT/VF. Volume ok - Echo 01/22/24 EF 25% mild to moderate MR severe LAE RV ok  - Now with NYHA IIIB-IV symptoms despite adequate volume control  - BP remains low -> concern for low output - Decrease bisoprolol  5mg  -> 2.5 mg daily - Continue Jardiance   10 - Continue losartan  12.5 daily - Continue spiro 12.5 - start digoxin  0.125 daily - Long discussion about likely need for advanced therapies in the near future. Age prevents transplant so we discussed LVAD therapy and I provided him (and his daughter) with a packet of information to review. - I will see back in several weeks with VAD coordinators. Suspect he would be excellent VAD candidate.  - Will need CP testing to further evaluate when testing resumed in June/July  - If getting worse in interim will proceed with R/L cath - labs today  2. CAD - s/p status post previous anterior wall infarct in 1994 with BMS to the LAD and OM1.   - Follow-up cardiac catheterization in 2019 revealed patent stent sites.  - No current s/s angina - Continue ASA/statin   3.  Chronic atrial fibrillation  - CHA2DS2-VASc score of 6.   - He has preferred Coumadin  to DOAC's and continues with follow-up in the anticoagulation clinic.   - Rate mildly elevated and with decrease in bisoprolol  may increase slightly - Adding digoxin    I spent a total of 55 minutes today: 1) reviewing the patient's medical records including previous charts, labs and recent notes from other providers; 2) examining the patient and counseling them on their medical issues/explaining the plan of care; 3) adjusting meds as needed and 4) ordering lab work or other needed tests.     Jules Oar, MD  2:41 PM

## 2024-01-29 ENCOUNTER — Ambulatory Visit: Attending: Cardiovascular Disease

## 2024-01-29 DIAGNOSIS — Z95810 Presence of automatic (implantable) cardiac defibrillator: Secondary | ICD-10-CM | POA: Diagnosis not present

## 2024-01-29 DIAGNOSIS — I5022 Chronic systolic (congestive) heart failure: Secondary | ICD-10-CM

## 2024-01-31 NOTE — Progress Notes (Signed)
 EPIC Encounter for ICM Monitoring  Patient Name: [name] is a 73 y.o. male Date: 01/31/2024 Primary Care Physican: Lauran Pollard, MD Primary Cardiologist: Londa Rival Electrophysiologist: Mealor 02/01/2023 Weight: 218 lbs 06/30/2023 Weight: 221 lbs 09/12/2023 Weight: 221 lbs  11/21/2023 Weight: 221-223 lbs 12/19/2023 Office Weight: 215 lbs 12/26/2023 Weight: 214 lbs 01/31/2024 Weight: 212 lbs   Spoke with patient and heart failure questions reviewed.  Transmission results reviewed.  Pt asymptomatic for fluid accumulation.   Had a 1st visit with Dr Julane Ny on 5/6.    Corvue Thoracic impedance suggesting normal fluid levels with the exception of possible fluid accumulation from 4/23-5/1.     Prescribed:  Spironolactone  25 mg take 0.5 tablet (12.5 mg total) daily. Jardiance  10 mg take 1 tablet by mouth before breakfast   Lab 12/25/2023 Creatinine 1.14, BUN 12, Potassium 4.2, Sodium 136, GFR 68  Recommendations:  No changes and encouraged to call if experiencing any fluid symptoms.   Follow-up plan: ICM clinic phone appointment on 03/06/2024.  91 day device clinic remote transmission 03/04/2024.       EP/Cardiology Office visits:  03/01/2024 with Lasalle Pointer, NP.   07/26/2024 with Dr Arlester Ladd.  02/09/2024 with Dr Julane Ny.   Copy of ICM check sent to Dr. Arlester Ladd.   3 month ICM trend: 01/29/2024.    12-14 Month ICM trend:     Almyra Jain, RN 01/31/2024 12:49 PM

## 2024-02-06 ENCOUNTER — Other Ambulatory Visit

## 2024-02-09 ENCOUNTER — Ambulatory Visit (HOSPITAL_COMMUNITY)
Admission: RE | Admit: 2024-02-09 | Discharge: 2024-02-09 | Disposition: A | Discharge status: Home / Self Care | Source: Ambulatory Visit | Attending: Internal Medicine | Admitting: Internal Medicine

## 2024-02-09 VITALS — BP 105/68 | HR 81 | Wt 212.2 lb

## 2024-02-09 DIAGNOSIS — I482 Chronic atrial fibrillation, unspecified: Secondary | ICD-10-CM | POA: Diagnosis not present

## 2024-02-09 DIAGNOSIS — Z95810 Presence of automatic (implantable) cardiac defibrillator: Secondary | ICD-10-CM | POA: Diagnosis not present

## 2024-02-09 DIAGNOSIS — I5022 Chronic systolic (congestive) heart failure: Secondary | ICD-10-CM | POA: Diagnosis not present

## 2024-02-09 DIAGNOSIS — Z7901 Long term (current) use of anticoagulants: Secondary | ICD-10-CM | POA: Insufficient documentation

## 2024-02-09 DIAGNOSIS — Z79899 Other long term (current) drug therapy: Secondary | ICD-10-CM | POA: Diagnosis not present

## 2024-02-09 DIAGNOSIS — I2510 Atherosclerotic heart disease of native coronary artery without angina pectoris: Secondary | ICD-10-CM | POA: Insufficient documentation

## 2024-02-09 DIAGNOSIS — I252 Old myocardial infarction: Secondary | ICD-10-CM | POA: Insufficient documentation

## 2024-02-09 DIAGNOSIS — Z5181 Encounter for therapeutic drug level monitoring: Secondary | ICD-10-CM

## 2024-02-09 DIAGNOSIS — I110 Hypertensive heart disease with heart failure: Secondary | ICD-10-CM | POA: Diagnosis present

## 2024-02-09 DIAGNOSIS — I25119 Atherosclerotic heart disease of native coronary artery with unspecified angina pectoris: Secondary | ICD-10-CM | POA: Diagnosis not present

## 2024-02-09 DIAGNOSIS — Z8673 Personal history of transient ischemic attack (TIA), and cerebral infarction without residual deficits: Secondary | ICD-10-CM | POA: Diagnosis not present

## 2024-02-09 DIAGNOSIS — Z96642 Presence of left artificial hip joint: Secondary | ICD-10-CM | POA: Diagnosis not present

## 2024-02-09 DIAGNOSIS — I4891 Unspecified atrial fibrillation: Secondary | ICD-10-CM

## 2024-02-09 LAB — BASIC METABOLIC PANEL WITH GFR
Anion gap: 9 (ref 5–15)
BUN: 19 mg/dL (ref 8–23)
CO2: 27 mmol/L (ref 22–32)
Calcium: 9.1 mg/dL (ref 8.9–10.3)
Chloride: 101 mmol/L (ref 98–111)
Creatinine, Ser: 1.11 mg/dL (ref 0.61–1.24)
GFR, Estimated: >60 mL/min (ref >60)
Glucose, Bld: 92 mg/dL (ref 70–99)
Potassium: 4.4 mmol/L (ref 3.5–5.1)
Sodium: 137 mmol/L (ref 135–145)

## 2024-02-09 LAB — DIGOXIN LEVEL: Digoxin Level: <0.4 ng/mL — ABNORMAL LOW (ref 0.8–2.0)

## 2024-02-09 LAB — BRAIN NATRIURETIC PEPTIDE: B Natriuretic Peptide: 254.9 pg/mL — ABNORMAL HIGH (ref 0.0–100.0)

## 2024-02-09 NOTE — Progress Notes (Signed)
 Patient presents for VAD consult in VAD Clinic today. Recent referral to heart failure clinic with initiation of VAD evaluation.    Pt ambulated into clinic unassisted. Denies lightheadedness, dizziness, shortness of breath, heart failure symptoms, and signs of bleeding. Feels much better since the last time he was seen in clinic. He was able to mow the lawn (riding mower) and weed eat yesterday afternoon. He has increased his daily physical activity (walking on a treadmill) since last visit.   Pt decreased Bisoprolol  to 2.5 mg and started Digoxin  0.125 mg daily as prescribed last visit. BP improved today. No medication changes today per Dr Julane Ny. Will obtain BMET, BNP, and Digoxin  level.   Initial VAD consult/education completed as documented below. At this time will table VAD eval as pt's symptoms have improved. Per Dr Julane Ny will plan for pt to undergo CPX testing in July.  Discussed need for CPX in July with Emer RN in HF clinic. VAD team will continue to follow along for readiness for evaluation testing.    Vital Signs:  HR: 81 BP: 105/68 (80) SPO2: 96%   Weight: 212.2 lb lb  Last weight: 210 lb Home weights:  lbs  Device: St Jude Therapies: on Last check: 12/06/23  Symptom YES NO DETAILS  Angina  x Activity:  Claudication  x How Far:  Syncope  x When:  Stroke  x   Orthopnea  x How many pillows:  PND  x How often:  CPAP   How many hours:  Pedal Edema  x   Abdominal Fullness  x   Nausea / Vomit  x   Diaphoresis  x When:  Shortness of Breath  x Activity:  Palpitations  x When:  ICD shock  x   Bleeding S/S  x   Tea-colored Urine     Hospitalizations  x   Emergency Room  x   Other MD     Activity Able to walk 0.75 - 1 mile daily on the treadmill. Able to accomplish all ADLs independently  Fluid   Diet Appetite has improved    [facility] EDUCATION NOTE:   Initial brief VAD teaching completed with pt and his daughter Laurina Popper via telephone.  VAD educational packet  including "Understanding Your Options with Advanced Heart Failure", "[place] Patient Agreement for VAD Evaluation and Potential Implantation" consent, and Abbott "Heartmate 3 Left Ventricular Device (LVAD) Patient Guide", "[place] HM III Patient Education", "[place] Mechanical Circulatory Support Program", and "Decision Aids for Left Ventricular Assist Device" reviewed in detail and given to patient for continued reference.     All questions answered regarding VAD implant, hospital stay, and what to expect when discharged home living with a heart pump. Discussed need to have caregivers in place if this therapy should be deemed appropriate.   Patient and caregiver(s) acknowledge that the indication at this point in time for LVAD therapy would be for destination therapy due to age.   Provided brief equipment overview HeartMate III equipment using Abbott pamphlet picture.  Pt is friends with one of our current LVAD patients. He has seen LVAD equipment and discussed with him about living on support.    The patient understands that from this discussion it does not mean that they will receive the device, but that depends on an extensive evaluation process. The patient is aware of the fact that if at anytime they want to stop the evaluation process they can. Discussed evaluation process in detail with pt and his daughter.  Patient Instructions:  No medication changes today Return to Heart Failure clinic in 2 months for follow up with Dr Bensimhon We will get you scheduled for an exercise test in July  Paulo Bosworth RN VAD Coordinator  Office: [phone]  24/7 Pager: [phone]

## 2024-02-09 NOTE — Patient Instructions (Signed)
 No medication changes today Return to Heart Failure clinic in 2 months for follow up with Dr Bensimhon We will get you scheduled for an exercise test in July

## 2024-02-09 NOTE — Progress Notes (Signed)
 ADVANCED HF CLINIC CONSULT NOTE  Referring Physician: Lauran Pollard, MD Primary Care: Lauran Pollard, MD Primary Cardiologist: Teddie Favre, MD  Chief Complaint: Heart failure  HPI:  [name] is a 73 y.o. male (father of Jenise Mixer RN). He is a retired Psychologist, occupational. Referred by Dr. Londa Rival for further evalaution of his HF  Had an anterior MI in 1994 with BMS to the LAD and OM1.  Follow-up cardiac catheterization in 2019 revealed patent stent sites EF 25-35%  Underwent STJ ICD with change-out 5/18   Had remote CVA but recovered well   Had left hip replacement on 11/24/23. Struggled post-op with progressive HF symptoms and low BP.   Echo 01/22/24 EF 25% mild to moderate MR severe LAE RV ok   We saw on 01/23/24 and was struggling with low BP and NYHA IIIB symptoms. B-blocker cut back, digoxin  added and VAD w/u discussed.   Returns today for f/u In VAD Clinic. Feeling much better. More active. Going to gym several times per week and feeling good. Able to do ADLs with no problem. No CP, undue SOB, edema, orthopnea or PND. Compliant with meds. SBP imprvoed    Past Medical History:  Diagnosis Date   AICD (automatic cardioverter/defibrillator) present 2006   ICD   Anterior myocardial infarction (HCC)    BCC (basal cell carcinoma) superficial 11/22/2016   Left back sholder   Chronic systolic heart failure (HCC)    Coronary atherosclerosis of native coronary artery    a. s/p BMS to LAD and OM1 b. 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HCC)    Ischemic cardiomyopathy    LVEF 30%, status post ICD   OSA (obstructive sleep apnea)    unable to tolerate CPAP   Paroxysmal atrial fibrillation (HCC)    On coumadin    Pseudoaneurysm (HCC)    SCC (squamous cell carcinoma) 08/04/2006   right shoulder ([id number])   SCC (squamous cell carcinoma) well differentiated 12/13/2011   right temple   Squamous cell  carcinoma in situ (SCCIS) 08/04/2006   right sholder   Squamous cell carcinoma of skin 11/09/2020   in situ- left upper back   Squamous cell carcinoma of skin 11/09/2020   KA-right dorsal hand   Stroke [facility])     Current Outpatient Medications  Medication Sig Dispense Refill   aspirin  EC 81 MG tablet Take 81 mg by mouth in the morning.     bisoprolol  (ZEBETA ) 5 MG tablet Take 0.5 tablets (2.5 mg total) by mouth every evening.     cefdinir (OMNICEF) 300 MG capsule Take 300 mg by mouth 2 (two) times daily. Take for 7 days for UTI per PCP     digoxin  (LANOXIN ) 0.125 MG tablet Take 1 tablet (0.125 mg total) by mouth daily. 90 tablet 3   ezetimibe (ZETIA) 10 MG tablet Take 10 mg by mouth daily.     JARDIANCE  10 MG TABS tablet TAKE 1 TABLET BY MOUTH EVERY DAY BEFORE BREAKFAST 30 tablet 11   losartan  (COZAAR ) 25 MG tablet Take 0.5 tablets (12.5 mg total) by mouth daily. 45 tablet 1   rosuvastatin  (CRESTOR ) 20 MG tablet Take 1 tablet (20 mg total) by mouth daily. 90 tablet 2   spironolactone  (ALDACTONE ) 25 MG tablet TAKE 1/2 TABLET BY MOUTH EVERY DAY 45 tablet 1   nitroGLYCERIN  (NITROSTAT ) 0.4 MG SL tablet Place 0.4 mg under the tongue every  5 (five) minutes x 3 doses as needed for chest pain (dissolve under tongue if no relief after 3rd dose proceed to ED or call 911).     warfarin (COUMADIN ) 5 MG tablet TAKE 1/2 TABLET DAILY EXCEPT 1 TABLET ON TUESDAYS AND SATURDAYS OR AS DIRECTED 90 tablet 3   No current facility-administered medications for this encounter.    No Known Allergies    Social History   Socioeconomic History   Marital status: Married    Spouse name: Not on file   Number of children: Not on file   Years of education: Not on file   Highest education level: Not on file  Occupational History   Occupation: retired    Comment: Banking  Tobacco Use   Smoking status: Never    Passive exposure: Never   Smokeless tobacco: Never  Vaping Use   Vaping status: Never Used   Substance and Sexual Activity   Alcohol  use: Yes    Comment: occasional "moderate"   Drug use: No   Sexual activity: Yes  Other Topics Concern   Not on file  Social History Narrative   Not on file   Social Drivers of Health   Financial Resource Strain: Not on file  Food Insecurity: No Food Insecurity (11/24/2023)   Hunger Vital Sign    Worried About Running Out of Food in the Last Year: Never true    Ran Out of Food in the Last Year: Never true  Transportation Needs: No Transportation Needs (11/24/2023)   PRAPARE - Administrator, Civil Service (Medical): No    Lack of Transportation (Non-Medical): No  Physical Activity: Not on file  Stress: Not on file  Social Connections: Socially Integrated (11/24/2023)   Social Connection and Isolation Panel [NHANES]    Frequency of Communication with Friends and Family: More than three times a week    Frequency of Social Gatherings with Friends and Family: More than three times a week    Attends Religious Services: More than 4 times per year    Active Member of [employer] or Organizations: Yes    Attends Engineer, structural: More than 4 times per year    Marital Status: Married  Catering manager Violence: Not At Risk (11/24/2023)   Humiliation, Afraid, Rape, and Kick questionnaire    Fear of Current or Ex-Partner: No    Emotionally Abused: No    Physically Abused: No    Sexually Abused: No      Family History  Problem Relation Age of Onset   Cancer Mother        lymphoma    Heart attack Father    Heart disease Brother    Colon cancer Neg Hx    Colon polyps Neg Hx     Vitals:   02/09/24 1509  BP: 105/68  Pulse: 81  SpO2: 96%  Weight: 96.3 kg (212 lb 3.2 oz)     PHYSICAL EXAM: General:  Well appearing. No resp difficulty HEENT: normal Neck: supple. no JVD. Carotids 2+ bilat; no bruits. No lymphadenopathy or thryomegaly appreciated. Cor: PMI nondisplaced. Irregular rate & rhythm. No rubs, gallops or  murmurs. Lungs: clear Abdomen: soft, nontender, nondistended. No hepatosplenomegaly. No bruits or masses. Good bowel sounds. Extremities: no cyanosis, clubbing, rash, edema Neuro: alert & orientedx3, cranial nerves grossly intact. moves all 4 extremities w/o difficulty. Affect pleasant   ASSESSMENT & PLAN:  1. Chronic systolic HF due to iCM - anterior MI in 1994 with BMS to  the LAD and OM1.   - Follow-up cardiac catheterization in 2019 revealed patent stent sites EF 25-35% - Underwent STJ ICD with change-out 5/18. - ICD interrogated today in clinic. No VT/VF. Volume ok  - Echo 01/22/24 EF 25% mild to moderate MR severe LAE RV ok  - Much improved today after recent med changes. NYHA II  - Volume status ok  - Continue bisoprolol  2.5 mg daily - Continue Jardiance  10 - Continue losartan  12.5 daily - Continue spiro 12.5 - Continue digoxin  0.125 daily - Clearly improved. I think we can hold off on VAD w/u for now. Will plan CPX testing to further risk stratify.  - Check labs today  2. CAD - s/p status post previous anterior wall infarct in 1994 with BMS to the LAD and OM1.   - Follow-up cardiac catheterization in 2019 revealed patent stent sites.  - No current s/s angina - Continue ASA/statin   3.  Chronic atrial fibrillation  - CHA2DS2-VASc score of 6.   - He has preferred Coumadin  to DOAC's and continues with follow-up in the anticoagulation clinic.   - Rate controlled - No bleeding  Jules Oar, MD  10:35 PM

## 2024-02-13 ENCOUNTER — Ambulatory Visit: Attending: Cardiology | Admitting: *Deleted

## 2024-02-13 DIAGNOSIS — I4891 Unspecified atrial fibrillation: Secondary | ICD-10-CM | POA: Diagnosis not present

## 2024-02-13 DIAGNOSIS — I6350 Cerebral infarction due to unspecified occlusion or stenosis of unspecified cerebral artery: Secondary | ICD-10-CM | POA: Diagnosis not present

## 2024-02-13 DIAGNOSIS — Z5181 Encounter for therapeutic drug level monitoring: Secondary | ICD-10-CM

## 2024-02-13 LAB — POCT INR: INR: 2.1 (ref 2.0–3.0)

## 2024-02-13 NOTE — Patient Instructions (Signed)
 Continue warfarin 1/2 tablet daily except 1 tablet on Tuesdays, Thursdays and Saturdays.  S/P hip surgery 3/7   Recheck INR in 4 wks

## 2024-02-16 ENCOUNTER — Encounter: Payer: Self-pay | Admitting: Internal Medicine

## 2024-03-01 ENCOUNTER — Encounter: Payer: Self-pay | Admitting: Nurse Practitioner

## 2024-03-01 ENCOUNTER — Ambulatory Visit: Attending: Nurse Practitioner | Admitting: Nurse Practitioner

## 2024-03-01 VITALS — BP 112/64 | HR 64 | Ht 73.0 in | Wt 210.2 lb

## 2024-03-01 DIAGNOSIS — I5020 Unspecified systolic (congestive) heart failure: Secondary | ICD-10-CM

## 2024-03-01 DIAGNOSIS — I4891 Unspecified atrial fibrillation: Secondary | ICD-10-CM | POA: Diagnosis not present

## 2024-03-01 DIAGNOSIS — I2510 Atherosclerotic heart disease of native coronary artery without angina pectoris: Secondary | ICD-10-CM | POA: Diagnosis not present

## 2024-03-01 DIAGNOSIS — E782 Mixed hyperlipidemia: Secondary | ICD-10-CM | POA: Diagnosis not present

## 2024-03-01 DIAGNOSIS — Z95810 Presence of automatic (implantable) cardiac defibrillator: Secondary | ICD-10-CM

## 2024-03-01 NOTE — Patient Instructions (Addendum)

## 2024-03-01 NOTE — Progress Notes (Unsigned)
 Cardiology Office Note   Date:  03/01/2024 ID:  WILGUS DEYTON, DOB April 21, 1951, MRN [id number] PCP: Lauran Pollard, MD  [facility] Providers Cardiologist:  Teddie Favre, MD Electrophysiologist:  Efraim Grange, MD     History of Present Illness [name] is a delightful 73 y.o. male with a PMH of CAD, HFrEF, ICM, A-fib, mixed HLD, ICD in place, who presents today for 6 week follow-up.   Hx of anterior wall MI in 1994, s/p BMS to LAD and OM1. Cardiac cath performed in 2019 showed patent sites.   Last seen by Dr. Londa Rival on January 17, 2024. Was recovering well from hip surgery, however pt noted more DOE. Entresto  was switched to Losartan  d/t low BP. Repeat Echo revealed relatively stable EF at 25-30%, normal RV contraction. Was referred to HF clinic.   Seen by Dr. Julane Ny on Jan 23, 2024. BP continued to remain low, concern for low output. Medication adjusted. Was recommended he would qualify for advanced therapies in near future. LVAD was discussed. CP testing was arranged to further evaluate his symptoms.   Today he presents for 6 week follow-up. He states he is overall doing well. Active as he can be on the treadmill. Mobility is getting better after his hip surgery. Notes stable DOE. Denies any chest pain, worsening shortness of breath, palpitations, syncope, presyncope, dizziness, orthopnea, PND, swelling or significant weight changes, acute bleeding, or claudication.   ROS: Negative. See HPI.  SH: One daughter is a Charity fundraiser for [employer], used to work for [employer]. Other daughter is a Runner, broadcasting/film/video for [employer]. Wife is a Management consultant who travels often. 3 grandchildren and one grandchild is nationally ranked in golf, 2 grandsons play baseball. 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 He enjoys going to his grandchildren's sports games.   Studies Reviewed  EKG: EKG is not ordered today.   Echo 01/2024:    1. Left ventricular ejection fraction, by estimation, is 25 to 30%. The  left ventricle has severely decreased function. Left ventricular  endocardial border not optimally defined to evaluate regional wall motion.  Left ventricular diastolic parameters are   indeterminate.   2. Right ventricular systolic function is normal. The right ventricular  size is normal. There is normal pulmonary artery systolic pressure.   3. Left atrial size was moderately dilated.   4. The mitral valve is abnormal. Mild mitral valve regurgitation. No  evidence of mitral stenosis.   5. The tricuspid valve is abnormal.   6. The aortic valve is tricuspid. There is mild calcification of the  aortic valve. There is mild thickening of the aortic valve. Aortic valve  regurgitation is mild. No aortic stenosis is present.   7. The inferior vena cava is normal in size with greater than 50%  respiratory variability, suggesting right atrial pressure of 3 mmHg.   Comparison(s): A prior study was performed on 06/22/2022. EF 30-35% with  RWMA. Slow flow at apex without formed mural thrombus by definisty  contrast.  LHC 06/2018:  There is severe left ventricular systolic dysfunction. The left ventricular ejection fraction is 25-35% by visual estimate. LV end diastolic pressure is normal. Minimal disease with underlying two-vessel disease involving LAD and circumflex: Ost Cx to Prox Cx stent is 5% stenosed. Prox LAD stent is 10% stenosed. Mid Cx lesion is 40% stenosed.   Angiographically minimal disease with widely patent stents in the circumflex and LAD. Severely reduced LVEF  of 30% with regional wall motion normality in the anterior and inferior apex with swirling of flow. Normal LVEDP   Plan: Return to nursing unit for ongoing care.  TR band removal. Further plans per primary team and EP.       Recommend Aspirin  81mg  daily for moderate CAD.  Reinitiate warfarin therapy tonight if no other procedures  planned.  Lexiscan  06/2018:  There was no ST segment deviation noted during stress. No T wave inversion was noted during stress. Defect 1: There is a large defect of severe severity present in the mid anterior, mid anteroseptal, apical anterior, apical septal, apical inferior, apical lateral and apex location. Defect 2: There is a medium defect of moderate severity present in the mid inferior location. This is a high risk study. Findings consistent with prior myocardial infarction with peri-infarct ischemia. The left ventricular ejection fraction is moderately decreased (30-44%).   Abnormal, high risk stress nuclear study with large prior anteroseptal and apical infarct; prior inferior infarct with mild peri-infarct ischemia.  Gated ejection fraction 32% with akinesis of the anteroseptal wall and apex.  Severe left ventricular enlargement.  Study interpreted as high risk due to reduced LV function.  Physical Exam VS:  BP 112/64   Pulse 64   Ht 6' 1 (1.854 m)   Wt 210 lb 3.2 oz (95.3 kg)   SpO2 97%   BMI 27.73 kg/m    Wt Readings from Last 3 Encounters:  03/01/24 210 lb 3.2 oz (95.3 kg)  02/09/24 212 lb 3.2 oz (96.3 kg)  01/23/24 210 lb (95.3 kg)    GEN: Well nourished, well developed in no acute distress NECK: No JVD; No carotid bruits CARDIAC: S1/S2, irregularly irregular rhythm, no murmurs, rubs, gallops RESPIRATORY:  Clear to auscultation without rales, wheezing or rhonchi  ABDOMEN: Soft, non-tender, non-distended EXTREMITIES:  No edema; No deformity   ASSESSMENT AND PLAN  HFrEF Longstanding hx of reduced EF. Echo 01/2024, EF 25-30%, normal RV contraction. Stage C/D, Hx of ICD. Class II symptoms. Euvolemic and well compensated on exam. Continue Bisoprolol , Digoxin , Losartan , Jardiance , and Aldactone . GDMT limited d/t BP trends. Continue current GDMT - continue to follow-up with HF clinic. Low sodium diet, fluid restriction <2L, and daily weights encouraged. Educated to contact  our office for weight gain of 2 lbs overnight or 5 lbs in one week. LVAD therapy has been discussed with pt.   CAD Stable with no anginal symptoms. No indication for ischemic evaluation. No medication changes at this time. Heart healthy diet and regular cardiovascular exercise encouraged. Care and ED precautions discussed.   A-fib Denies any tachycardia or palpitations. HR is well controlled. Continue current medication regimen. Continue to f/u with Coumadin  Clinic. Denies any bleeding issues.   Mixed HLD No recent lipid panel on file. Will request labs from Dr. Marshell Skelton office. No medication changes at this time. Heart healthy diet and regular cardiovascular exercise encouraged.   ICD in place Most recent remote device check revealed normal device function. Follow-up with EP as scheduled.   I spent a total duration of 40 minutes reviewing prior notes, reviewing outside records including  labs, tests, face-to-face counseling of medical condition, pathophysiology, evaluation, management, and documenting the findings in the note.   Dispo: Follow-up with MD/APP in 3 months or sooner if anything changes.   Signed, Lasalle Pointer, NP

## 2024-03-04 ENCOUNTER — Ambulatory Visit (INDEPENDENT_AMBULATORY_CARE_PROVIDER_SITE_OTHER): Payer: BC Managed Care – PPO

## 2024-03-04 DIAGNOSIS — I5020 Unspecified systolic (congestive) heart failure: Secondary | ICD-10-CM

## 2024-03-04 DIAGNOSIS — Z95810 Presence of automatic (implantable) cardiac defibrillator: Secondary | ICD-10-CM

## 2024-03-04 LAB — CUP PACEART REMOTE DEVICE CHECK
Battery Remaining Longevity: 44 mo
Battery Remaining Percentage: 43 %
Battery Voltage: 2.93 V
Brady Statistic RV Percent Paced: 1 %
Date Time Interrogation Session: 20250616040019
HighPow Impedance: 44 Ohm
HighPow Impedance: 44 Ohm
Implantable Lead Connection Status: 753985
Implantable Lead Implant Date: 20020111
Implantable Lead Location: 753860
Implantable Lead Model: 148
Implantable Lead Serial Number: 116740
Implantable Pulse Generator Implant Date: 20180503
Lead Channel Impedance Value: 640 Ohm
Lead Channel Pacing Threshold Amplitude: 1.25 V
Lead Channel Pacing Threshold Pulse Width: 0.5 ms
Lead Channel Sensing Intrinsic Amplitude: 12 mV
Lead Channel Setting Pacing Amplitude: 2.5 V
Lead Channel Setting Pacing Pulse Width: 0.5 ms
Lead Channel Setting Sensing Sensitivity: 0.5 mV
Pulse Gen Serial Number: 7421148

## 2024-03-05 ENCOUNTER — Ambulatory Visit: Payer: Self-pay | Admitting: Cardiovascular Disease

## 2024-03-06 ENCOUNTER — Ambulatory Visit: Attending: Cardiovascular Disease

## 2024-03-06 DIAGNOSIS — Z95810 Presence of automatic (implantable) cardiac defibrillator: Secondary | ICD-10-CM | POA: Diagnosis not present

## 2024-03-06 DIAGNOSIS — I5022 Chronic systolic (congestive) heart failure: Secondary | ICD-10-CM

## 2024-03-08 NOTE — Progress Notes (Signed)
 EPIC Encounter for ICM Monitoring  Patient Name: [name] is a 74 y.o. male Date: 03/08/2024 Primary Care Physican: Lauran Pollard, MD Primary Cardiologist: Londa Rival Electrophysiologist: Mealor 02/01/2023 Weight: 218 lbs 06/30/2023 Weight: 221 lbs 09/12/2023 Weight: 221 lbs  11/21/2023 Weight: 221-223 lbs 12/19/2023 Office Weight: 215 lbs 12/26/2023 Weight: 214 lbs 01/31/2024 Weight: 212 lbs 03/08/2024 Weight: 210 lbs   Spoke with patient and heart failure questions reviewed.  Transmission results reviewed.  Pt asymptomatic for fluid accumulation.      [place] Thoracic impedance suggesting normal fluid levels with the exception of possible fluid accumulation from 5/15-5/26, 6/2-6/5 and 6/7-6/11.     Prescribed:  Spironolactone  25 mg take 0.5 tablet (12.5 mg total) daily. Jardiance  10 mg take 1 tablet by mouth before breakfast   Lab 02/09/2024 Creatinine 1.11, BUN 19, Potassium 4.4, Sodium 137, GFR >60 12/25/2023 Creatinine 1.14, BUN 12, Potassium 4.2, Sodium 136, GFR 68   Recommendations:  No changes and encouraged to call if experiencing any fluid symptoms.   Follow-up plan: ICM clinic phone appointment on 04/22/2024.  91 day device clinic remote transmission 06/03/2024.       EP/Cardiology Office visits:  06/05/2024 with Dr Londa Rival.    07/26/2024 with Dr Arlester Ladd.  04/11/2024 with Dr Julane Ny.   Copy of ICM check sent to Dr. Arlester Ladd.   3 month ICM trend: 03/06/2024.    12-14 Month ICM trend:     Almyra Jain, RN 03/08/2024 1:17 PM

## 2024-03-11 ENCOUNTER — Ambulatory Visit: Attending: Cardiology | Admitting: *Deleted

## 2024-03-11 DIAGNOSIS — I6350 Cerebral infarction due to unspecified occlusion or stenosis of unspecified cerebral artery: Secondary | ICD-10-CM

## 2024-03-11 DIAGNOSIS — Z5181 Encounter for therapeutic drug level monitoring: Secondary | ICD-10-CM

## 2024-03-11 DIAGNOSIS — I4891 Unspecified atrial fibrillation: Secondary | ICD-10-CM | POA: Diagnosis not present

## 2024-03-11 LAB — POCT INR: INR: 4.2 — AB (ref 2.0–3.0)

## 2024-03-11 NOTE — Patient Instructions (Signed)
 Hold warfarin tonight, take 1/2 tablet tomorrow night then resume 1/2 tablet daily except 1 tablet on Tuesdays, Thursdays and Saturdays.  S/P hip surgery 3/7   Recheck INR in 3 wks

## 2024-03-12 ENCOUNTER — Encounter

## 2024-03-20 ENCOUNTER — Ambulatory Visit (HOSPITAL_COMMUNITY): Attending: Cardiology

## 2024-03-20 DIAGNOSIS — I2584 Coronary atherosclerosis due to calcified coronary lesion: Secondary | ICD-10-CM | POA: Diagnosis not present

## 2024-03-20 DIAGNOSIS — Z7901 Long term (current) use of anticoagulants: Secondary | ICD-10-CM | POA: Diagnosis not present

## 2024-03-20 DIAGNOSIS — I429 Cardiomyopathy, unspecified: Secondary | ICD-10-CM | POA: Insufficient documentation

## 2024-03-20 DIAGNOSIS — E7800 Pure hypercholesterolemia, unspecified: Secondary | ICD-10-CM | POA: Diagnosis not present

## 2024-03-20 DIAGNOSIS — I4891 Unspecified atrial fibrillation: Secondary | ICD-10-CM | POA: Diagnosis not present

## 2024-03-20 DIAGNOSIS — I509 Heart failure, unspecified: Secondary | ICD-10-CM | POA: Diagnosis not present

## 2024-03-20 DIAGNOSIS — Z95810 Presence of automatic (implantable) cardiac defibrillator: Secondary | ICD-10-CM | POA: Insufficient documentation

## 2024-03-20 DIAGNOSIS — I25119 Atherosclerotic heart disease of native coronary artery with unspecified angina pectoris: Secondary | ICD-10-CM | POA: Diagnosis not present

## 2024-03-20 DIAGNOSIS — G4733 Obstructive sleep apnea (adult) (pediatric): Secondary | ICD-10-CM | POA: Diagnosis not present

## 2024-03-20 DIAGNOSIS — I5022 Chronic systolic (congestive) heart failure: Secondary | ICD-10-CM | POA: Insufficient documentation

## 2024-03-28 ENCOUNTER — Other Ambulatory Visit: Payer: Self-pay | Admitting: Cardiology

## 2024-03-29 ENCOUNTER — Other Ambulatory Visit: Payer: Self-pay | Admitting: Cardiology

## 2024-04-01 ENCOUNTER — Ambulatory Visit: Attending: Cardiology | Admitting: *Deleted

## 2024-04-01 DIAGNOSIS — Z5181 Encounter for therapeutic drug level monitoring: Secondary | ICD-10-CM

## 2024-04-01 DIAGNOSIS — I4891 Unspecified atrial fibrillation: Secondary | ICD-10-CM

## 2024-04-01 DIAGNOSIS — I6350 Cerebral infarction due to unspecified occlusion or stenosis of unspecified cerebral artery: Secondary | ICD-10-CM

## 2024-04-01 LAB — POCT INR: INR: 4.8 — AB (ref 2.0–3.0)

## 2024-04-01 NOTE — Patient Instructions (Signed)
 Hold warfarin tonight then decrease dose to 1/2 tablet daily except 1 tablet on Saturdays.  S/P hip surgery 3/7   Recheck INR in 2 wks

## 2024-04-01 NOTE — Progress Notes (Signed)
 Impression/Assessment:  BPH with significant symptomatology, not doing so well on finasteride .  His prostate is not significantly large.  Stride is helping him.  Nocturia, significant  Plan:   History of Present Illness:   8.6.2019: IPSS 22 QOL score 4  Symptoms have worsened over the past year. He is still on finasteride .  He does not have a nickel allergy.  He would like to consider a Urolift procedure.  Pt had prostate U/S 9.11.2019--volume 32 ml    11.17.2020: Here today for follow-up. He reports stable sx's since last visit but he does note that he has been off finasteride  for the last 4 weeks -- there were errors with refilling his rx. He also has had two scheduled urolifts that were cancelled -- the first from a hernia operation and the second due to pneumonia (possible undiagnosed covid?). He has been tolerating his medication well but he does note he has been having increased issues with impotence.   11.16.2021: Pt continues on finasteride  but reports signficant LUTS. He was previously scheduled for a Urolift procedure but was unable to attend the procedure due to a concurrent hernia repair. He indicates nocturia is particularly concerning.  8.29.2023: Here for routine check.  BPH follow-up.  On finasteride  with significant LUTS despite that.  IPSS 23, quality-of-life score 3.  He does have sleep apnea, is not able to use CPAP.  Is going to discuss implant management soon.  Biggest issue is nocturia. PSA 0.6   7.15.2025:  Past Medical History:  Diagnosis Date   AICD (automatic cardioverter/defibrillator) present 2006   ICD   Anterior myocardial infarction (HCC)    BCC (basal cell carcinoma) superficial 11/22/2016   Left back sholder   Chronic systolic heart failure (HCC)    Coronary atherosclerosis of native coronary artery    a. s/p BMS to LAD and OM1 b. 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HCC)     Ischemic cardiomyopathy    LVEF 30%, status post ICD   OSA (obstructive sleep apnea)    unable to tolerate CPAP   Paroxysmal atrial fibrillation (HCC)    On coumadin    Pseudoaneurysm (HCC)    SCC (squamous cell carcinoma) 08/04/2006   right shoulder (CX35FU)   SCC (squamous cell carcinoma) well differentiated 12/13/2011   right temple   Squamous cell carcinoma in situ (SCCIS) 08/04/2006   right sholder   Squamous cell carcinoma of skin 11/09/2020   in situ- left upper back   Squamous cell carcinoma of skin 11/09/2020   KA-right dorsal hand   Stroke [facility])     Past Surgical History:  Procedure Laterality Date   AICD implantation  2011   St Jude ICD implanted for primary prevention of sudden death   BIOPSY  05-12-20   Procedure: BIOPSY;  Surgeon: Shaaron Lamar HERO, MD;  Location: [facility] SU[facility];  Service: Endoscopy;;   CATARACT EXTRACTION W/PHACO Left 03/27/2017   Procedure: CATARACT EXTRACTION PHACO AND INTRAOCULAR LENS PLACEMENT (IOC);  Surgeon: Perley Hamilton, MD;  Location: [facility] ORS;  Service: Ophthalmology;  Laterality: Left;  CDE: 8.48   CATARACT EXTRACTION W/PHACO Right 05/29/2017   Procedure: CATARACT EXTRACTION PHACO AND INTRAOCULAR LENS PLACEMENT (IOC);  Surgeon: Perley Hamilton, MD;  Location: [facility] ORS;  Service: Ophthalmology;  Laterality: Right;  CDE: 7.48   COLONOSCOPY N/A May 12, 2020   Scattered medium-mouthed diverticula in sigmoid, three sessile polyps in hepatic flexure and cecum. 5-7  mm in size. Tubular adenomas. Benign colonic mucosa. 3 year surveillance.    EYE SURGERY     FLEXIBLE SIGMOIDOSCOPY N/A 08/09/2023   Procedure: FLEXIBLE SIGMOIDOSCOPY;  Surgeon: Shaaron Lamar HERO, MD;  Location: [facility];  Service: Endoscopy;  Laterality: N/A;  10:15AM, ASA 3   ICD GENERATOR CHANGEOUT N/A 01/19/2017   SJM Ellipse VR ICD generator change by Dr Kelsie   LAPAROSCOPY N/A 10/23/2017   Procedure: LAPAROSCOPY DIAGNOSTIC;  Surgeon: Ebbie Cough, MD;  Location: [facility] OR;  Service: General;   Laterality: N/A;   LEFT HEART CATH AND CORONARY ANGIOGRAPHY N/A 06/21/2018   Procedure: LEFT HEART CATH AND CORONARY ANGIOGRAPHY;  Surgeon: Anner Alm ORN, MD;  Location: [facility] INVASIVE CV LAB;  Service: Cardiovascular;  Laterality: N/A;   POLYPECTOMY  04/21/2020   Procedure: POLYPECTOMY;  Surgeon: Shaaron Lamar HERO, MD;  Location: [facility];  Service: Endoscopy;;   TOTAL HIP ARTHROPLASTY Left 11/24/2023   Procedure: LEFT TOTAL HIP ARTHROPLASTY ANTERIOR APPROACH;  Surgeon: Vernetta Lonni GRADE, MD;  Location: [facility] ORS;  Service: Orthopedics;  Laterality: Left;   VENTRAL HERNIA REPAIR N/A 10/23/2017   Procedure: VENTRAL HERNIA REPAIR;  Surgeon: Ebbie Cough, MD;  Location: [facility] OR;  Service: General;  Laterality: N/A;    Home Medications:  Allergies as of 04/02/2024   No Known Allergies      Medication List        Accurate as of April 01, 2024 12:25 PM. If you have any questions, ask your nurse or doctor.          aspirin  EC 81 MG tablet Take 81 mg by mouth in the morning.   bisoprolol  5 MG tablet Commonly known as: ZEBETA  Take 0.5 tablets (2.5 mg total) by mouth every evening.   digoxin  0.125 MG tablet Commonly known as: LANOXIN  Take 1 tablet (0.125 mg total) by mouth daily.   ezetimibe 10 MG tablet Commonly known as: ZETIA Take 10 mg by mouth daily.   Jardiance  10 MG Tabs tablet Generic drug: empagliflozin  TAKE 1 TABLET BY MOUTH EVERY DAY BEFORE BREAKFAST   losartan  25 MG tablet Commonly known as: COZAAR  Take 0.5 tablets (12.5 mg total) by mouth daily.   nitroGLYCERIN  0.4 MG SL tablet Commonly known as: NITROSTAT  Place 0.4 mg under the tongue every 5 (five) minutes x 3 doses as needed for chest pain (dissolve under tongue if no relief after 3rd dose proceed to ED or call 911).   rosuvastatin  20 MG tablet Commonly known as: CRESTOR  TAKE 1 TABLET BY MOUTH EVERY DAY   spironolactone  25 MG tablet Commonly known as: ALDACTONE  TAKE 1/2 TABLET BY MOUTH EVERY DAY    warfarin 5 MG tablet Commonly known as: COUMADIN  Take as directed by the anticoagulation clinic. If you are unsure how to take this medication, talk to your nurse or doctor. Original instructions: TAKE 1/2 TABLET DAILY EXCEPT 1 TABLET ON TUESDAYS AND SATURDAYS OR AS DIRECTED        Allergies: No Known Allergies  Family History  Problem Relation Age of Onset   Cancer Mother        lymphoma    Heart attack Father    Heart disease Brother    Colon cancer Neg Hx    Colon polyps Neg Hx     Social History:  reports that he has never smoked. He has never been exposed to tobacco smoke. He has never used smokeless tobacco. He reports current alcohol  use. He reports that he does not use  drugs.  ROS: A complete review of systems was performed.  All systems are negative except for pertinent findings as noted.  Physical Exam:  Vital signs in last 24 hours: There were no vitals taken for this visit. Constitutional:  Alert and oriented, No acute distress Cardiovascular: Regular rate  Respiratory: Normal respiratory effort GI: Hernias Genitourinary: Normal male phallus, testes are descended bilaterally and non-tender and without masses, scrotum is normal in appearance without lesions or masses, perineum is normal on inspection.  Normal anal sphincter tone, prostate 30 g, symmetric, nonnodular, nontender. Lymphatic: No lymphadenopathy Neurologic: Grossly intact, no focal deficits Psychiatric: Normal mood and affect  I have reviewed prior pt notes  I have reviewed notes from referring/previous physicians  I have reviewed urinalysis results  I have independently reviewed prior imaging-prostate ultrasound for volume in 2019 was 32 mL  I have reviewed prior PSA results last PSA 0.6 in late 2021, correction for finasteride  0.2

## 2024-04-01 NOTE — Progress Notes (Signed)
Please see anticoagulation encounter.

## 2024-04-02 ENCOUNTER — Ambulatory Visit: Admitting: Urology

## 2024-04-02 VITALS — BP 99/61 | HR 85

## 2024-04-02 DIAGNOSIS — N401 Enlarged prostate with lower urinary tract symptoms: Secondary | ICD-10-CM | POA: Diagnosis not present

## 2024-04-02 DIAGNOSIS — R351 Nocturia: Secondary | ICD-10-CM | POA: Diagnosis not present

## 2024-04-03 LAB — URINALYSIS, ROUTINE W REFLEX MICROSCOPIC
Bilirubin, UA: NEGATIVE
Ketones, UA: NEGATIVE
Leukocytes,UA: NEGATIVE
Nitrite, UA: NEGATIVE
Protein,UA: NEGATIVE
Specific Gravity, UA: 1.02 (ref 1.005–1.030)
Urobilinogen, Ur: 1 mg/dL (ref 0.2–1.0)
pH, UA: 6 (ref 5.0–7.5)

## 2024-04-03 LAB — MICROSCOPIC EXAMINATION: Bacteria, UA: NONE SEEN

## 2024-04-03 LAB — PSA: Prostate Specific Ag, Serum: 2.4 ng/mL (ref 0.0–4.0)

## 2024-04-11 ENCOUNTER — Encounter (HOSPITAL_COMMUNITY): Admitting: Internal Medicine

## 2024-04-11 NOTE — Progress Notes (Signed)
 Remote ICD transmission.

## 2024-04-15 ENCOUNTER — Ambulatory Visit: Attending: Cardiology | Admitting: *Deleted

## 2024-04-15 DIAGNOSIS — Z5181 Encounter for therapeutic drug level monitoring: Secondary | ICD-10-CM

## 2024-04-15 DIAGNOSIS — I6350 Cerebral infarction due to unspecified occlusion or stenosis of unspecified cerebral artery: Secondary | ICD-10-CM

## 2024-04-15 DIAGNOSIS — I4891 Unspecified atrial fibrillation: Secondary | ICD-10-CM | POA: Diagnosis not present

## 2024-04-15 LAB — POCT INR: INR: 2.1 (ref 2.0–3.0)

## 2024-04-15 NOTE — Progress Notes (Signed)
 INR 2.1 Please see anticoagulation encounter

## 2024-04-15 NOTE — Patient Instructions (Signed)
 Continue warfarin 1/2 tablet daily except 1 tablet on Saturdays.  S/P hip surgery 3/7   Recheck INR in 3 wks

## 2024-04-17 ENCOUNTER — Ambulatory Visit (HOSPITAL_COMMUNITY)
Admission: RE | Admit: 2024-04-17 | Discharge: 2024-04-17 | Disposition: A | Discharge status: Home / Self Care | Source: Ambulatory Visit | Attending: Internal Medicine | Admitting: Internal Medicine

## 2024-04-17 VITALS — BP 114/80 | HR 78 | Wt 209.3 lb

## 2024-04-17 DIAGNOSIS — Z5181 Encounter for therapeutic drug level monitoring: Secondary | ICD-10-CM | POA: Diagnosis not present

## 2024-04-17 DIAGNOSIS — I2510 Atherosclerotic heart disease of native coronary artery without angina pectoris: Secondary | ICD-10-CM

## 2024-04-17 DIAGNOSIS — I5022 Chronic systolic (congestive) heart failure: Secondary | ICD-10-CM | POA: Diagnosis not present

## 2024-04-17 DIAGNOSIS — Z4509 Encounter for adjustment and management of other cardiac device: Secondary | ICD-10-CM | POA: Diagnosis present

## 2024-04-17 DIAGNOSIS — I4891 Unspecified atrial fibrillation: Secondary | ICD-10-CM

## 2024-04-17 DIAGNOSIS — G4733 Obstructive sleep apnea (adult) (pediatric): Secondary | ICD-10-CM | POA: Diagnosis not present

## 2024-04-17 LAB — BASIC METABOLIC PANEL WITH GFR
Anion gap: 9 (ref 5–15)
BUN: 17 mg/dL (ref 8–23)
CO2: 25 mmol/L (ref 22–32)
Calcium: 9.8 mg/dL (ref 8.9–10.3)
Chloride: 104 mmol/L (ref 98–111)
Creatinine, Ser: 1.03 mg/dL (ref 0.61–1.24)
GFR, Estimated: >60 mL/min (ref >60)
Glucose, Bld: 95 mg/dL (ref 70–99)
Potassium: 4.3 mmol/L (ref 3.5–5.1)
Sodium: 138 mmol/L (ref 135–145)

## 2024-04-17 LAB — BRAIN NATRIURETIC PEPTIDE: B Natriuretic Peptide: 183.5 pg/mL — ABNORMAL HIGH (ref 0.0–100.0)

## 2024-04-17 LAB — DIGOXIN LEVEL: Digoxin Level: <0.4 ng/mL — ABNORMAL LOW (ref 0.8–2.0)

## 2024-04-17 MED ORDER — LOSARTAN POTASSIUM 25 MG PO TABS: 25.0000 mg | ORAL_TABLET | Freq: Every day | ORAL | 3 refills | Status: AC

## 2024-04-17 NOTE — Progress Notes (Signed)
 Patient presents for 2 month f/u in VAD Clinic today.   Pt ambulated into clinic unassisted. Denies lightheadedness, dizziness, shortness of breath, heart failure symptoms, and signs of bleeding. He is able to mow the lawn (riding mower) and weed eat. He is able to tolerate walking on the treadmill at home. Does not have limitations completing ADLs.   He is taking all medications as prescribed. Will increase Losartan  to 25 mg daily per Dr Bensimhon. Updated prescription sent to pt's pharmacy. Pt verbalized understanding of medication change. Will obtain BMET, BNP, and Digoxin  level today per Dr Cherrie.   Pt recently seen by Urology for nocturia. Dr Matilda recommending sleep stud/sleep apnea treatment. Pt has severe sleep apnea, and historically been unable to tolerate CPAP mask. Per Dr Cherrie Itamar sleep study ordered today. See separate note for documentation. Instructions for testing provided to pt.   CPX completed 7/3. Results reviewed with pt by Dr Cherrie.   Per Dr Cherrie, pt too well for VAD at this time. Will transition back to heart failure clinic. Advised to call clinic if he is feeling unwell or has questions prior to next follow up visit. Provided with HF Clinic & VAD clinic phone numbers today. He verbalized understanding.    Vital Signs:  HR: 78 BP: 114/80 (92) SPO2: 96%   Weight: 209.4 lb lb  Last weight: 212.2 lb Home weights:  lbs  Device: St Jude Therapies: on Last check: 12/06/23  Symptom YES NO DETAILS  Angina  x Activity:  Claudication  x How Far:  Syncope  x When:  Stroke  x   Orthopnea  x How many pillows:  PND  x How often:  CPAP  x Unable to tolerate wearing CPAP. Itamar sleep study ordered today by Dr Cherrie.   Pedal Edema  x   Abdominal Fullness  x   Nausea / Vomit  x   Diaphoresis  x When:  Shortness of Breath  x Activity:  Palpitations  x When:  ICD shock  x   Bleeding S/S  x   Tea-colored Urine  x   Hospitalizations  x    Emergency Room  x   Other MD X  Recent urology appt  Activity Able to walk daily on the treadmill. Able to accomplish all ADLs independently  Fluid   Diet Appetite has improved    Patient Instructions:  Increase Losartan  to 25 mg daily 2. Your provider has recommended that you have a home sleep study (Itamar Test).  We have provided you with the equipment in our office today. Please go ahead and download the app. DO NOT OPEN OR TAMPER WITH THE BOX UNTIL WE ADVISE YOU TO DO SO. Once insurance has approved the test our office will call you with PIN number and approval to proceed with testing. Once you have completed the test you just dispose of the equipment, the information is automatically uploaded to us  via blue-tooth technology. If your test is positive for sleep apnea and you need a home CPAP machine you will be contacted by Dr Dorine office [facility]) to set this up.  3. Return to the Heart Failure Clinic to see Dr Cherrie in 3 months. We will call you to schedule this once October schedule opens. If you have not heard from us  by September please call [phone] to schedule.   Isaiah Knoll RN VAD Coordinator  Office: [phone]  24/7 Pager: [phone]

## 2024-04-17 NOTE — Progress Notes (Signed)
 ITAMAR home sleep study given to patient, all instructions explained, waiver signed, and CLOUDPAT registration complete.

## 2024-04-17 NOTE — Patient Instructions (Addendum)
 Increase Losartan  to 25 mg daily 2. Your provider has recommended that you have a home sleep study (Itamar Test).  We have provided you with the equipment in our office today. Please go ahead and download the app. DO NOT OPEN OR TAMPER WITH THE BOX UNTIL WE ADVISE YOU TO DO SO. Once insurance has approved the test our office will call you with PIN number and approval to proceed with testing. Once you have completed the test you just dispose of the equipment, the information is automatically uploaded to us  via blue-tooth technology. If your test is positive for sleep apnea and you need a home CPAP machine you will be contacted by Dr Dorine office [facility]) to set this up. 3. Return to the Heart Failure Clinic to see Dr Cherrie in 3 months. We will call you to schedule this once October schedule opens. If you have not heard from us  by September please call [phone] to schedule.

## 2024-04-17 NOTE — Progress Notes (Signed)
 Height:     Weight: BMI:  Today's Date:  STOP BANG RISK ASSESSMENT S (snore) Have you been told that you snore?     YES   T (tired) Are you often tired, fatigued, or sleepy during the day?   YES  O (obstruction) Do you stop breathing, choke, or gasp during sleep? NO   P (pressure) Do you have or are you being treated for high blood pressure? YES   B (BMI) Is your body index greater than 35 kg/m? NO   A (age) Are you 74 years old or older? YES   N (neck) Do you have a neck circumference greater than 16 inches?   NO   G (gender) Are you a male? YES   TOTAL STOP/BANG "YES" ANSWERS 5                                                                       For Office Use Only              Procedure Order Form    YES to 3+ Stop Bang questions OR two clinical symptoms - patient qualifies for WatchPAT (CPT 95800)      Clinical Notes: Will consult Sleep Specialist and refer for management of therapy due to patient increased risk of Sleep Apnea. Ordering a sleep study due to the following two clinical symptoms: Excessive daytime sleepiness G47.10 / Gastroesophageal reflux K21.9 / Nocturia R35.1 / Morning Headaches G44.221 / Difficulty concentrating R41.840 / Memory problems or poor judgment G31.84 / Personality changes or irritability R45.4 / Loud snoring R06.83 / Depression F32.9 / Unrefreshed by sleep G47.8 / Impotence N52.9 / History of high blood pressure R03.0 / Insomnia G47.00

## 2024-04-21 NOTE — Progress Notes (Addendum)
 ADVANCED HF CLINIC NOTE  Referring Physician: Trudy Vaughn FALCON, MD Primary Care: Trudy Vaughn FALCON, MD Primary Cardiologist: Jayson Sierras, MD  Chief Complaint: Heart failure  HPI:  [name] is a 73 y.o. male (father of Benton Righter RN). He is a retired Psychologist, occupational. Referred by Dr. Sierras for further evalaution of his HF  Had an anterior MI in 1994 with BMS to the LAD and OM1.  Follow-up cardiac catheterization in 2019 revealed patent stent sites EF 25-35%  Underwent STJ ICD with change-out 5/18   Had remote CVA but recovered well   Had left hip replacement on 11/24/23. Struggled post-op with progressive HF symptoms and low BP.   Echo 01/22/24 EF 25% mild to moderate MR severe LAE RV ok   We saw on 01/23/24 and was struggling with low BP and NYHA IIIB symptoms. B-blocker cut back, digoxin  added and VAD w/u discussed.   Returns today for f/u In VAD Clinic. Continues to feel much better. Exercise on TM regularly. Denies CP or undue SOB. No edema, orthopnea or PND. No low BPs. Compliant with meds. 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 s/p BMS to LAD and OM1 b. 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     bisoprolol  (ZEBETA ) 5 MG tablet Take 0.5 tablets (2.5 mg total) by mouth daily. 45 tablet 3   digoxin  (LANOXIN ) 0.125 MG tablet Take 1 tablet (0.125 mg total) by mouth daily. 90 tablet 3   ezetimibe (ZETIA) 10 MG tablet Take 10 mg by mouth daily.     JARDIANCE  10 MG TABS tablet TAKE 1 TABLET BY MOUTH EVERY DAY BEFORE BREAKFAST 30 tablet 11   rosuvastatin  (CRESTOR ) 20 MG tablet TAKE 1 TABLET BY MOUTH EVERY DAY 90 tablet 2   spironolactone  (ALDACTONE ) 25 MG tablet TAKE 1/2 TABLET BY MOUTH EVERY DAY 45 tablet  1   warfarin (COUMADIN ) 5 MG tablet TAKE 1/2 TABLET DAILY EXCEPT 1 TABLET ON TUESDAYS AND SATURDAYS OR AS DIRECTED 90 tablet 3   losartan  (COZAAR ) 25 MG tablet Take 1 tablet (25 mg total) by mouth daily. 90 tablet 3   nitroGLYCERIN  (NITROSTAT ) 0.4 MG SL tablet Place 0.4 mg under the tongue every 5 (five) minutes x 3 doses as needed for chest pain (dissolve under tongue if no relief after 3rd dose proceed to ED or call 911). (Patient not taking: Reported on 04/17/2024)     No current facility-administered medications for this encounter.    No Known Allergies    Social History   Socioeconomic History   Marital status: Married    Spouse name: Not on file   Number of children: Not on file   Years of education: Not on file   Highest education level: Not on file  Occupational History   Occupation: retired    Comment:  Banking  Tobacco Use   Smoking status: Never    Passive exposure: Never   Smokeless tobacco: Never  Vaping Use   Vaping status: Never Used  Substance and Sexual Activity   Alcohol  use: Yes    Comment: occasional moderate   Drug use: No   Sexual activity: Yes  Other Topics Concern   Not on file  Social History Narrative   Not on file   Social Drivers of Health   Financial Resource Strain: Not on file  Food Insecurity: No Food Insecurity (11/24/2023)   Hunger Vital Sign    Worried About Running Out of Food in the Last Year: Never true    Ran Out of Food in the Last Year: Never true  Transportation Needs: No Transportation Needs (11/24/2023)   PRAPARE - Administrator, Civil Service (Medical): No    Lack of Transportation (Non-Medical): No  Physical Activity: Not on file  Stress: Not on file  Social Connections: Socially Integrated (11/24/2023)   Social Connection and Isolation Panel    Frequency of Communication with Friends and Family: More than three times a week    Frequency of Social Gatherings with Friends and Family: More than three times a week    Attends Religious Services: More than 4 times per year    Active Member of [employer] or Organizations: Yes    Attends Engineer, structural: More than 4 times per year    Marital Status: Married  Catering manager Violence: Not At Risk (11/24/2023)   Humiliation, Afraid, Rape, and Kick questionnaire    Fear of Current or Ex-Partner: No    Emotionally Abused: No    Physically Abused: No    Sexually Abused: No      Family History  Problem Relation Age of Onset   Cancer Mother        lymphoma    Heart attack Father    Heart disease Brother    Colon cancer Neg Hx    Colon polyps Neg Hx     Vitals:   04/17/24 1059  BP: 114/80  Pulse: 78  SpO2: 96%  Weight: 94.9 kg (209 lb 4.8 oz)     PHYSICAL EXAM: General:  Well appearing. No resp difficulty HEENT: normal Neck: supple. no JVD. Carotids 2+ bilat;  no bruits. No lymphadenopathy or thryomegaly appreciated. Cor: PMI nondisplaced. Irreg Lungs: clear Abdomen: soft, nontender, nondistended. No hepatosplenomegaly. No bruits or masses. Good bowel sounds. Extremities: no cyanosis, clubbing, rash, edema Neuro: alert & orientedx3, cranial  nerves grossly intact. moves all 4 extremities w/o difficulty. Affect pleasant   ASSESSMENT & PLAN:  1. Chronic systolic HF due to iCM - anterior MI in 1994 with BMS to the LAD and OM1.   - Follow-up cardiac catheterization in 2019 revealed patent stent sites EF 25-35% - Underwent STJ ICD with change-out 5/18. - ICD interrogated today in clinic. No VT/VF. Volume ok  - Echo 01/22/24 EF 25% mild to moderate MR severe LAE RV ok  - Continues to do well and increase his stamina after recent surgery - NYHA II - Volume status looks good - Continue bisoprolol  2.5 mg daily - Continue Jardiance  10 - Increase losartan  to 25mg  daily - Continue spiro 12.5 - Continue digoxin  0.125 daily - CPX 03/21/24: pVO2: 15.1 (58%) slope:  40  pRER: 1.22  VO2 @ VT: 10.2  (39%) VE/MVV:  81%  - CPX with significant HF limitation but overall I think it remains too early to consider VAD. Will continue aggressive medical Rx. I discussed results in detail with him and his daughter  2. CAD - s/p status post previous anterior wall infarct in 1994 with BMS to the LAD and OM1.   - Follow-up cardiac catheterization in 2019 revealed patent stent sites.  - No s/s angina - Continue ASA/statin   3.  Chronic atrial fibrillation  - CHA2DS2-VASc score of 6.   - He has preferred Coumadin  to DOAC's and continues with follow-up in the anticoagulation clinic.   - Rate controlled. No bleeding  4. Snoring/fatigue - check HST  I spent a total of 45 minutes today: 1) reviewing the patient's medical records including previous charts, labs and recent notes from other providers; 2) examining the patient and counseling them on their medical  issues/explaining the plan of care; 3) adjusting meds as needed and 4) ordering lab work or other needed tests.    Toribio Fuel, MD  3:35 PM

## 2024-04-22 ENCOUNTER — Ambulatory Visit: Attending: Cardiovascular Disease

## 2024-04-22 DIAGNOSIS — Z95810 Presence of automatic (implantable) cardiac defibrillator: Secondary | ICD-10-CM | POA: Diagnosis not present

## 2024-04-22 DIAGNOSIS — I5022 Chronic systolic (congestive) heart failure: Secondary | ICD-10-CM | POA: Diagnosis not present

## 2024-04-23 ENCOUNTER — Telehealth (HOSPITAL_COMMUNITY): Payer: Self-pay

## 2024-04-23 NOTE — Telephone Encounter (Signed)
 Received notification that patients insurance does not need pre cert for Itamar sleep study. Called and spoke with patient and made him aware. He will proceed with testing and he is aware of the instructions.   Advised patient to call back to office with any issues, questions, or concerns. Patient verbalized understanding.

## 2024-04-24 NOTE — Progress Notes (Signed)
 EPIC Encounter for ICM Monitoring  Patient Name: [name] is a 73 y.o. male Date: 04/24/2024 Primary Care Physican: Trudy Vaughn FALCON, MD Primary Cardiologist: Debera Electrophysiologist: Mealor 02/01/2023 Weight: 218 lbs 06/30/2023 Weight: 221 lbs 09/12/2023 Weight: 221 lbs  11/21/2023 Weight: 221-223 lbs 12/19/2023 Office Weight: 215 lbs 12/26/2023 Weight: 214 lbs 01/31/2024 Weight: 212 lbs 03/08/2024 Weight: 210 lbs 04/24/2024 Weight: 210 lbs   Spoke with patient and heart failure questions reviewed.  Transmission results reviewed.  Pt asymptomatic for fluid accumulation.      [facility]vue Thoracic impedance suggesting normal fluid levels with the exception of possible fluid accumulation from 7/12-7/20 (no vacation).     Prescribed:  Spironolactone  25 mg take 0.5 tablet (12.5 mg total) daily. Jardiance  10 mg take 1 tablet by mouth before breakfast   Lab 02/09/2024 Creatinine 1.11, BUN 19, Potassium 4.4, Sodium 137, GFR >60 12/25/2023 Creatinine 1.14, BUN 12, Potassium 4.2, Sodium 136, GFR 68   Recommendations:  No changes and encouraged to call if experiencing any fluid symptoms.   Follow-up plan: ICM clinic phone appointment on 05/27/2024.  91 day device clinic remote transmission 06/03/2024.       EP/Cardiology Office visits:  06/05/2024 with Dr Debera.   07/26/2024 with Dr Nancey.  Recall 07/16/2024 with Dr Cherrie.   Copy of ICM check sent to Dr. Nancey.   3 month ICM trend: 04/22/2024.    12-14 Month ICM trend:     [name], RN 04/24/2024 4:57 PM

## 2024-05-06 ENCOUNTER — Ambulatory Visit: Attending: Cardiology | Admitting: *Deleted

## 2024-05-06 DIAGNOSIS — I4891 Unspecified atrial fibrillation: Secondary | ICD-10-CM | POA: Diagnosis not present

## 2024-05-06 DIAGNOSIS — Z5181 Encounter for therapeutic drug level monitoring: Secondary | ICD-10-CM | POA: Diagnosis not present

## 2024-05-06 DIAGNOSIS — I6350 Cerebral infarction due to unspecified occlusion or stenosis of unspecified cerebral artery: Secondary | ICD-10-CM | POA: Diagnosis not present

## 2024-05-06 LAB — POCT INR: INR: 2.1 (ref 2.0–3.0)

## 2024-05-06 NOTE — Progress Notes (Signed)
 INR 2.1; Please see anticoagulation encounter

## 2024-05-06 NOTE — Patient Instructions (Signed)
 Continue warfarin 1/2 tablet daily except 1 tablet on Saturdays.  S/P hip surgery 3/7   Recheck INR in 4 wks

## 2024-05-12 ENCOUNTER — Encounter (INDEPENDENT_AMBULATORY_CARE_PROVIDER_SITE_OTHER): Admitting: Cardiology

## 2024-05-12 DIAGNOSIS — G4733 Obstructive sleep apnea (adult) (pediatric): Secondary | ICD-10-CM

## 2024-05-14 ENCOUNTER — Ambulatory Visit: Attending: Internal Medicine

## 2024-05-14 DIAGNOSIS — G4733 Obstructive sleep apnea (adult) (pediatric): Secondary | ICD-10-CM

## 2024-05-14 NOTE — Procedures (Signed)
 SLEEP STUDY REPORT Patient Information Study Date: 05/12/2024 Patient Name: [name] Patient ID: [id number] Birth Date: 01-Mar-1951 Age: 73 Gender: Male BMI: 27.8 (W=209 lb, H=6' 1'') Stopbang: 5 Referring Physician: Rolan Fuel, MD  TEST DESCRIPTION: Home sleep apnea testing was completed using the WatchPat, a Type 1 device, utilizing  peripheral arterial tonometry (PAT), chest movement, actigraphy, pulse oximetry, pulse rate, body position and snore.  AHI was calculated with apnea and hypopnea using valid sleep time as the denominator. RDI includes apneas,  hypopneas, and RERAs. The data acquired and the scoring of sleep and all associated events were performed in  accordance with the recommended standards and specifications as outlined in the AASM Manual for the Scoring of  Sleep and Associated Events 2.2.0 (2015).  FINDINGS:  1. Severe Obstructive Sleep Apnea with AHI 55.4/hr.   2. Severe Central Sleep Apnea with pAHIc 32.1/hr.  3. Oxygen desaturations as low as 76%.  4. Moderate to severe snoring was present. O2 sats were < 88% for 50.6 min.  5. Total sleep time was 6 hrs and 36 min.  6. 29.2% of total sleep time was spent in REM sleep.   7. Normal sleep onset latency at 13 min.   8. Prolonged REM sleep onset latency at 149 min.   9. Total awakenings were 6.  10. Arrhythmia detection: Suggestive of possible brief atrial fibrillation lasting 6hrs, and 3 seconds. This is not  diagnostic and further testing with outpatient telemetry monitoring is recommended.  DIAGNOSIS:  Severe Obstructive Sleep Apnea (G47.33) Severe Central Sleep Apnea Nocturnal Hypoxemia Possible Atrial Fibrillation  RECOMMENDATIONS: 1. Clinical correlation of these findings is necessary. The decision to treat obstructive sleep apnea (OSA) is usually  based on the presence of apnea symptoms or the presence of associated medical conditions such as Hypertension,  Congestive Heart  Failure, Atrial Fibrillation or Obesity. The most common symptoms of OSA are snoring, gasping for  breath while sleeping, daytime sleepiness and fatigue.  2. Initiating apnea therapy is recommended given the presence of symptoms and/or associated conditions.  Recommend proceeding with one of the following:  a. Auto-CPAP therapy with a pressure range of 5-20cm H2O.  b. An oral appliance (OA) that can be obtained from certain dentists with expertise in sleep medicine. These are  primarily of use in non-obese patients with mild and moderate disease.  c. An ENT consultation which may be useful to look for specific causes of obstruction and possible treatment  options.  d. If patient is intolerant to PAP therapy, consider referral to ENT for evaluation for hypoglossal nerve stimulator.  3. Close follow-up is necessary to ensure success with CPAP or oral appliance therapy for maximum benefit . 4. A follow-up oximetry study on CPAP is recommended to assess the adequacy of therapy and determine the need  for supplemental oxygen or the potential need for Bi-level therapy. An arterial blood gas to determine the adequacy of  baseline ventilation and oxygenation should also be considered. 5. Healthy sleep recommendations include: adequate nightly sleep (normal 7-9 hrs/night), avoidance of caffeine after  noon and alcohol  near bedtime, and maintaining a sleep environment that is cool, dark and quiet. 6. Weight loss for overweight patients is recommended. Even modest amounts of weight loss can significantly  improve the severity of sleep apnea. 7. Snoring recommendations include: weight loss where appropriate, side sleeping, and avoidance of alcohol  before  bed. 8. Operation of motor vehicle should be avoided when sleepy.  Signature: Wilbert Bihari,  MD; CODY; Diplomat, American Board of Sleep  Medicine Electronically Signed: 05/14/2024 9:35:15 AM

## 2024-05-16 ENCOUNTER — Telehealth: Payer: Self-pay | Admitting: *Deleted

## 2024-05-16 DIAGNOSIS — I4891 Unspecified atrial fibrillation: Secondary | ICD-10-CM

## 2024-05-16 DIAGNOSIS — G4733 Obstructive sleep apnea (adult) (pediatric): Secondary | ICD-10-CM

## 2024-05-16 DIAGNOSIS — I2510 Atherosclerotic heart disease of native coronary artery without angina pectoris: Secondary | ICD-10-CM

## 2024-05-16 NOTE — Telephone Encounter (Signed)
-----   Message from [name] sent at 05/14/2024  9:36 AM EDT ----- Please let patient know that they have sleep apnea.  Recommend therapeutic CPAP titration for treatment of patient's sleep disordered breathing.

## 2024-05-27 ENCOUNTER — Ambulatory Visit: Attending: Cardiovascular Disease

## 2024-05-27 DIAGNOSIS — I5022 Chronic systolic (congestive) heart failure: Secondary | ICD-10-CM

## 2024-05-27 DIAGNOSIS — Z95810 Presence of automatic (implantable) cardiac defibrillator: Secondary | ICD-10-CM

## 2024-05-28 NOTE — Progress Notes (Signed)
 EPIC Encounter for ICM Monitoring  Patient Name: [name] is a 73 y.o. male Date: 05/28/2024 Primary Care Physican: Trudy Vaughn FALCON, MD Primary Cardiologist: Debera Electrophysiologist: Mealor 02/01/2023 Weight: 218 lbs 06/30/2023 Weight: 221 lbs 09/12/2023 Weight: 221 lbs  11/21/2023 Weight: 221-223 lbs 12/19/2023 Office Weight: 215 lbs 12/26/2023 Weight: 214 lbs 01/31/2024 Weight: 212 lbs 03/08/2024 Weight: 210 lbs 04/24/2024 Weight: 210 lbs 05/28/2024 Weight: 210 lbs   Spoke with patient and heart failure questions reviewed.  Transmission results reviewed.  Pt asymptomatic for fluid accumulation.   Pt was at football game this past weekend and ate foods higher in salt than normal.  He is back to his normal diet now.  He has taken home sleep study and will follow up with physician regarding that.    [facility] Thoracic impedance suggesting possible fluid accumulation starting 9/7 and trending back toward baseline (was out of town on 9/7).  Also suggesting possible fluid accumulation from 8/17-8/27.   Prescribed:  Spironolactone  25 mg take 0.5 tablet (12.5 mg total) daily. Jardiance  10 mg take 1 tablet by mouth before breakfast   Lab 02/09/2024 Creatinine 1.11, BUN 19, Potassium 4.4, Sodium 137, GFR >60 12/25/2023 Creatinine 1.14, BUN 12, Potassium 4.2, Sodium 136, GFR 68   Recommendations:  No changes and encouraged to call if experiencing any fluid symptoms.   Follow-up plan: ICM clinic phone appointment on 07/08/2024.  91 day device clinic remote transmission 06/03/2024.       EP/Cardiology Office visits:  06/05/2024 with Dr Debera.   07/26/2024 with Dr Nancey.  Recall 07/16/2024 with Dr Cherrie.   Copy of ICM check sent to Dr. Nancey.   3 month ICM trend: 05/27/2024.    12-14 Month ICM trend:     [name], RN 05/28/2024 12:21 PM

## 2024-06-03 ENCOUNTER — Encounter

## 2024-06-03 ENCOUNTER — Ambulatory Visit (INDEPENDENT_AMBULATORY_CARE_PROVIDER_SITE_OTHER): Payer: BC Managed Care – PPO

## 2024-06-03 DIAGNOSIS — I5020 Unspecified systolic (congestive) heart failure: Secondary | ICD-10-CM | POA: Diagnosis not present

## 2024-06-04 LAB — CUP PACEART REMOTE DEVICE CHECK
Battery Remaining Longevity: 43 mo
Battery Remaining Percentage: 42 %
Battery Voltage: 2.92 V
Brady Statistic RV Percent Paced: 1 %
Date Time Interrogation Session: 20250915052528
HighPow Impedance: 48 Ohm
HighPow Impedance: 48 Ohm
Implantable Lead Connection Status: 753985
Implantable Lead Implant Date: 20020111
Implantable Lead Location: 753860
Implantable Lead Model: 148
Implantable Lead Serial Number: 116740
Implantable Pulse Generator Implant Date: 20180503
Lead Channel Impedance Value: 610 Ohm
Lead Channel Pacing Threshold Amplitude: 1.25 V
Lead Channel Pacing Threshold Pulse Width: 0.5 ms
Lead Channel Sensing Intrinsic Amplitude: 12 mV
Lead Channel Setting Pacing Amplitude: 2.5 V
Lead Channel Setting Pacing Pulse Width: 0.5 ms
Lead Channel Setting Sensing Sensitivity: 0.5 mV
Pulse Gen Serial Number: 7421148

## 2024-06-04 NOTE — Telephone Encounter (Signed)
 What problem are you experiencing?    Who is your medical equipment company?   3)    If patient is calling about their sleep study results please route to [facility] Sleep Study Pool. Dr Bettyjane told daughter yo call out office and talk to us  about patient and his C-pap usage  Please route to the sleep study coordinator.

## 2024-06-05 ENCOUNTER — Ambulatory Visit (INDEPENDENT_AMBULATORY_CARE_PROVIDER_SITE_OTHER): Admitting: *Deleted

## 2024-06-05 ENCOUNTER — Other Ambulatory Visit (HOSPITAL_BASED_OUTPATIENT_CLINIC_OR_DEPARTMENT_OTHER): Payer: Self-pay

## 2024-06-05 ENCOUNTER — Ambulatory Visit: Attending: Cardiology | Admitting: Cardiology

## 2024-06-05 ENCOUNTER — Encounter: Payer: Self-pay | Admitting: Cardiology

## 2024-06-05 VITALS — BP 98/70 | HR 86 | Ht 72.0 in | Wt 207.6 lb

## 2024-06-05 DIAGNOSIS — Z5181 Encounter for therapeutic drug level monitoring: Secondary | ICD-10-CM

## 2024-06-05 DIAGNOSIS — I4891 Unspecified atrial fibrillation: Secondary | ICD-10-CM | POA: Diagnosis not present

## 2024-06-05 DIAGNOSIS — I5020 Unspecified systolic (congestive) heart failure: Secondary | ICD-10-CM

## 2024-06-05 DIAGNOSIS — G4733 Obstructive sleep apnea (adult) (pediatric): Secondary | ICD-10-CM | POA: Diagnosis not present

## 2024-06-05 DIAGNOSIS — I25119 Atherosclerotic heart disease of native coronary artery with unspecified angina pectoris: Secondary | ICD-10-CM

## 2024-06-05 DIAGNOSIS — I6350 Cerebral infarction due to unspecified occlusion or stenosis of unspecified cerebral artery: Secondary | ICD-10-CM

## 2024-06-05 DIAGNOSIS — I4819 Other persistent atrial fibrillation: Secondary | ICD-10-CM

## 2024-06-05 DIAGNOSIS — Z95810 Presence of automatic (implantable) cardiac defibrillator: Secondary | ICD-10-CM | POA: Diagnosis not present

## 2024-06-05 LAB — POCT INR: INR: 1.7 — AB (ref 2.0–3.0)

## 2024-06-05 MED ORDER — NITROGLYCERIN 0.4 MG SL SUBL
0.4000 mg | SUBLINGUAL_TABLET | SUBLINGUAL | 2 refills | Status: DC | PRN
  Filled 2024-06-05: qty 25, 7d supply, fill #0

## 2024-06-05 MED ORDER — NITROGLYCERIN 0.4 MG SL SUBL: 0.4000 mg | SUBLINGUAL_TABLET | SUBLINGUAL | 2 refills | Status: AC | PRN

## 2024-06-05 NOTE — Addendum Note (Signed)
 Addended by: Bonne Whack M on: 06/05/2024 10:31 AM   Modules accepted: Orders

## 2024-06-05 NOTE — Progress Notes (Signed)
 INR 1.7; Please see anticoagulation encounter

## 2024-06-05 NOTE — Progress Notes (Signed)
 Cardiology Office Note  Date: 06/05/2024   ID: [name], DOB [birth year], MRN [id number]  History of Present Illness: [name] is a 73 y.o. male last seen in July in the heart failure clinic, I reviewed the note.  He is here for a follow-up visit.  Overall doing better in terms of recuperation from prior hip surgery.  He tries to walk a mile and a half on his treadmill at home most days of the week.  Reports NYHA class II-III symptoms based on level of activity.  He has not had any fluid retention, weight is actually down a few pounds.  No angina.  St. 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 Nancey.  Recent device check showed normal function.  He has had no device shocks or syncope.  CPX in July showed significantly elevated VE/VCO2 and severely reduced VO2 at ventilatory threshold consistent with severe heart failure.  Dr. Bensimhon continues to follow with medical management and ultimately consideration of VAD when time appropriate.  Medications reviewed.  Tolerating current regimen well with low blood pressure at baseline, no dizziness.  I reviewed his interval lab work.  He did undergo a home sleep study which was abnormal, previously saw Dr. Burnard.  Physical Exam: VS:  BP 98/70 (BP Location: Right Arm)   Pulse 86   Ht 6' (1.829 m)   Wt 207 lb 9.6 oz (94.2 kg)   SpO2 96%   BMI 28.16 kg/m , BMI Body mass index is 28.16 kg/m.  Wt Readings from Last 3 Encounters:  06/05/24 207 lb 9.6 oz (94.2 kg)  04/17/24 209 lb 4.8 oz (94.9 kg)  03/01/24 210 lb 3.2 oz (95.3 kg)    General: Patient appears comfortable at rest. HEENT: Conjunctiva and lids normal. Neck: Supple, no elevated JVP or carotid bruits. Lungs: Clear to auscultation, nonlabored breathing at rest. Cardiac: Indistinct PMI, regular rate and rhythm, no S3 or significant systolic murmur, . Extremities: No pitting edema.  ECG:  An ECG dated 01/23/2024 was personally reviewed today and demonstrated:  Atrial  fibrillation with PVC, left anterior fascicular block.  Labwork: 11/28/2023: Hemoglobin 10.6; Platelets 118 04/17/2024: B Natriuretic Peptide 183.5; BUN 17; Creatinine, Ser 1.03; Potassium 4.3; Sodium 138   Other Studies Reviewed Today:  Echocardiogram 01/22/2024:  1. Left ventricular ejection fraction, by estimation, is 25 to 30%. The  left ventricle has severely decreased function. Left ventricular  endocardial border not optimally defined to evaluate regional wall motion.  Left ventricular diastolic parameters are   indeterminate.   2. Right ventricular systolic function is normal. The right ventricular  size is normal. There is normal pulmonary artery systolic pressure.   3. Left atrial size was moderately dilated.   4. The mitral valve is abnormal. Mild mitral valve regurgitation. No  evidence of mitral stenosis.   5. The tricuspid valve is abnormal.   6. The aortic valve is tricuspid. There is mild calcification of the  aortic valve. There is mild thickening of the aortic valve. Aortic valve  regurgitation is mild. No aortic stenosis is present.   7. The inferior vena cava is normal in size with greater than 50%  respiratory variability, suggesting right atrial pressure of 3 mmHg.   Assessment and Plan:  1.  CAD status post previous anterior wall infarct in 2002 with BMS to the LAD and OM1.  Follow-up cardiac catheterization in 2019 revealed patent stent sites.  He reports no angina and remains on aspirin  81 mg  daily.  Also on Crestor  20 g daily and Zetia 10 mg daily.   2.  HFrEF with ischemic cardiomyopathy, LVEF 25 to 30% with normal RV contraction by echocardiogram in May.  He is following in the advanced heart failure clinic.  Interval CPX reviewed.  Plan is medical therapy with LVAD being considered when time appropriate.  Presently with NYHA class II-III symptoms depending on level of activity, no fluid retention.  Continue bisoprolol  2.5 mg daily, Lanoxin  0.125 mg daily,  Jardiance  10 mg daily, Cozaar  25 mg daily, and Aldactone  12.5 mg daily.   3.  Long-term persistent/permanent atrial fibrillation with CHA2DS2-VASc score of 6.  He has preferred Coumadin  to DOAC's and continues with follow-up in the anticoagulation clinic.  No sense of palpitations.  Heart rate control adequate on present medications.   5.  Mixed hyperlipidemia.  LDL 69 in May 2023.  Continuing to follow with PCP.  Now on Zetia 10 mg daily along with Crestor  20 mg daily.   6.  St. 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 Nancey.  No reported device shocks or syncope.  Disposition:  Follow up 6 months.  Signed, Jayson JUDITHANN Sierras, M.D., F.A.C.C. [facility]

## 2024-06-05 NOTE — Patient Instructions (Addendum)

## 2024-06-05 NOTE — Patient Instructions (Signed)
 Take 1 tablet tonight and tomorrow night then resume 1/2 tablet daily except 1 tablet on Saturdays.  S/P hip surgery 3/7   Recheck INR in 3 wks

## 2024-06-06 ENCOUNTER — Telehealth: Payer: Self-pay | Admitting: *Deleted

## 2024-06-06 DIAGNOSIS — I2510 Atherosclerotic heart disease of native coronary artery without angina pectoris: Secondary | ICD-10-CM

## 2024-06-06 DIAGNOSIS — I5022 Chronic systolic (congestive) heart failure: Secondary | ICD-10-CM

## 2024-06-06 DIAGNOSIS — G4733 Obstructive sleep apnea (adult) (pediatric): Secondary | ICD-10-CM

## 2024-06-06 NOTE — Telephone Encounter (Signed)
The patient has been notified of the result. Left detailed message on voicemail and informed patient to call back.

## 2024-06-06 NOTE — Telephone Encounter (Signed)
-----   Message from Wilbert Bihari sent at 05/14/2024  9:36 AM EDT ----- Please let patient know that they have sleep apnea.  Recommend therapeutic CPAP titration for treatment of patient's sleep disordered breathing.

## 2024-06-07 NOTE — Telephone Encounter (Signed)
 The patient has been notified of the result and verbalized understanding.  All questions (if any) were answered. [name], CMA 06/07/2024 4:13 PM

## 2024-06-07 NOTE — Telephone Encounter (Signed)
 Return Call:  The patient has been notified of the result and verbalized understanding.  All questions (if any) were answered. [name], CMA 06/07/2024 3:07 PM    Precert titration

## 2024-06-08 ENCOUNTER — Ambulatory Visit: Payer: Self-pay | Admitting: Cardiovascular Disease

## 2024-06-10 ENCOUNTER — Other Ambulatory Visit: Payer: Self-pay | Admitting: Cardiology

## 2024-06-10 NOTE — Progress Notes (Signed)
Remote ICD Transmission.

## 2024-06-10 NOTE — Telephone Encounter (Signed)
 9/22  Prior Authorization for TITRATION sent to [facility] via web portal. Tracking Number . No Auth Required

## 2024-06-16 ENCOUNTER — Ambulatory Visit (HOSPITAL_BASED_OUTPATIENT_CLINIC_OR_DEPARTMENT_OTHER): Attending: Cardiology | Admitting: Cardiology

## 2024-06-16 DIAGNOSIS — I2510 Atherosclerotic heart disease of native coronary artery without angina pectoris: Secondary | ICD-10-CM | POA: Diagnosis present

## 2024-06-16 DIAGNOSIS — I4891 Unspecified atrial fibrillation: Secondary | ICD-10-CM | POA: Insufficient documentation

## 2024-06-16 DIAGNOSIS — G4733 Obstructive sleep apnea (adult) (pediatric): Secondary | ICD-10-CM | POA: Diagnosis present

## 2024-06-18 NOTE — Procedures (Addendum)
 Indications for Polysomnography The patient is a 73 year-old Male who is 6' and weighs 207.0 lbs. His BMI equals 28.3.  A full night titration treatment study was performed.  MedicationBisoprololdigoxin losartan  rosuvastatin   warfarin Polysomnogram Data A full night polysomnogram recorded the standard physiologic parameters including EEG, EOG, EMG, EKG, nasal and oral airflow.  Respiratory parameters of chest and abdominal movements were recorded with Respiratory Inductance Plethysmography belts.   Oxygen saturation was recorded by pulse oximetry.  Sleep Architecture The total recording time of the polysomnogram was 423.1 minutes.  The total sleep time was 328.5 minutes.  The patient spent 5.5% of total sleep time in Stage N1, 61.9% in Stage N2, 14.9% in Stages N3, and 17.7% in REM.  Sleep latency was 4.5 minutes.   REM latency was 46.0 minutes.  Sleep Efficiency was 77.6%.  Wake after Sleep Onset time was 89.5 minutes.  Titration Summary The patient was titrated at pressures ranging from 5 cm/H20 up to 19/15 cm/H20.  The last pressure used in the study was 19/15 cm/H20.  Respiratory Events The polysomnogram revealed a presence of 3 obstructive, 86 central, and 5 mixed apneas resulting in an Apnea index of 17.2 events per hour.  There were 33 hypopneas (GreaterEqual to3% desaturation and/or arousal) resulting in an Apnea\Hypopnea Index (AHI  GreaterEqual to3% desaturation and/or arousal) of 23.2 events per hour.  There were 11 hypopneas (GreaterEqual to4% desaturation) resulting in an Apnea\Hypopnea Index (AHI GreaterEqual to4% desaturation) of 19.2 events per hour.  There were 3  Respiratory Effort Related Arousals resulting in a RERA index of 0.5 events per hour. The Respiratory Disturbance Index is 23.7 events per hour.  The snore index was 65.6 events per hour.  Mean oxygen saturation was 95.8%.  The lowest oxygen saturation during sleep was 90.0%.  Time spent LessEqual to88% oxygen  saturation was  minutes.  Limb Activity There were 72 limb movements recorded.  Of this total, 66 were classified as PLMs.  Of the PLMs, 8 were associated with arousals.  The Limb Movement index was 13.2 per hour while the PLM index was 12.1 per hour.  Cardiac Summary The average pulse rate was 61.8 bpm.  The minimum pulse rate was 37.0 bpm while the maximum pulse rate was 82.0 bpm.  Cardiac rhythm atrial fibrillation with PVCs  Diagnosis: Obstructive Sleep Apnea Atrial Fibrillation Unsuccessful CPAP titration Successful BiPAP titration  Recommendations: 1.  Recommend a trial of ResMed auto BiPAP with IPAP max 20cm H2O, EPAP min 5cm H2O and PS 4cm H2O with small to medium [employer] Evora under the nose FF mask and heated humidity. 2. Close follow-up is necessary to ensure success with CPAP or oral appliance therapy for maximum benefit. 3. A follow-up oximetry study on CPAP is recommended to assess the adequacy of therapy and determine the need for supplemental oxygen or the potential need for Bi-level therapy.  An arterial blood gas to determine the adequacy of baseline ventilation and  oxygenation should also be considered. 4. Healthy sleep recommendations include:  adequate nightly sleep (normal 7-9 hrs/night), avoidance of caffeine after noon and alcohol  near bedtime, and maintaining a sleep environment that is cool, dark and quiet. 5. Weight loss for overweight patients is recommended.  Even modest amounts of weight loss can significantly improve the severity of sleep apnea. 6. Snoring recommendations include:  weight loss where appropriate, side sleeping, and avoidance of alcohol  before bed. 7. Operation of motor vehicle should be avoided when sleepy.    This  study was personally reviewed and electronically signed by: Wilbert Bihari, MD Accredited Board Certified in Sleep Medicine Date/Time: 06/18/2024 12:19PM

## 2024-06-25 ENCOUNTER — Telehealth: Payer: Self-pay | Admitting: *Deleted

## 2024-06-25 NOTE — Telephone Encounter (Signed)
-----   Message from Wilbert Bihari sent at 06/18/2024 12:22 PM EDT ----- Please let patient know that they had a successful PAP titration and let DME know that orders are in EPIC.  Please set up 6 week OV with me.

## 2024-06-25 NOTE — Telephone Encounter (Signed)
 The patient has been notified of the result and verbalized understanding.  All questions (if any) were answered. [name], CMA 06/25/2024 4:49 PM    Upon patient request DME selection is [facility]. Patient understands he will be contacted by [facility] to set up his cpap. Patient understands to call if [facility] does not contact him with new setup in a timely manner. Patient understands they will be called once confirmation has been received from [facility]/ that they have received their new machine to schedule 10 week follow up appointment.   [facility] notified of new cpap order  Please add to airview Patient was grateful for the call and thanked me.

## 2024-06-26 ENCOUNTER — Ambulatory Visit: Attending: Cardiology | Admitting: *Deleted

## 2024-06-26 ENCOUNTER — Other Ambulatory Visit: Payer: Self-pay | Admitting: Cardiology

## 2024-06-26 DIAGNOSIS — I6350 Cerebral infarction due to unspecified occlusion or stenosis of unspecified cerebral artery: Secondary | ICD-10-CM

## 2024-06-26 DIAGNOSIS — Z5181 Encounter for therapeutic drug level monitoring: Secondary | ICD-10-CM | POA: Diagnosis not present

## 2024-06-26 DIAGNOSIS — I5022 Chronic systolic (congestive) heart failure: Secondary | ICD-10-CM

## 2024-06-26 DIAGNOSIS — I4891 Unspecified atrial fibrillation: Secondary | ICD-10-CM | POA: Diagnosis not present

## 2024-06-26 LAB — POCT INR: INR: 1.8 — AB (ref 2.0–3.0)

## 2024-06-26 NOTE — Progress Notes (Signed)
 INR 1.8. Please see anticoagulation encounter

## 2024-06-26 NOTE — Patient Instructions (Signed)
 Increase warfarin to 1/2 tablet daily except 1 tablet on Wednesdays and Saturdays.  Recheck INR in 2 wks

## 2024-06-28 ENCOUNTER — Telehealth (HOSPITAL_COMMUNITY): Payer: Self-pay | Admitting: Internal Medicine

## 2024-06-28 NOTE — Telephone Encounter (Signed)
 Called to confirm/remind patient of their appointment at the [facility] on 06/28/2024.   Appointment:   [x] Confirmed  [] Left mess   [] No answer/No voice mail  [] VM Full/unable to leave message  [] Phone not in service  Patient reminded to bring all medications and/or complete list.  Confirmed patient has transportation. Gave directions, instructed to utilize valet parking.

## 2024-07-01 ENCOUNTER — Ambulatory Visit (HOSPITAL_COMMUNITY)
Admission: RE | Admit: 2024-07-01 | Discharge: 2024-07-01 | Disposition: A | Discharge status: Home / Self Care | Source: Ambulatory Visit | Attending: Internal Medicine | Admitting: Internal Medicine

## 2024-07-01 ENCOUNTER — Encounter (HOSPITAL_COMMUNITY): Payer: Self-pay | Admitting: Internal Medicine

## 2024-07-01 VITALS — BP 92/60 | HR 87 | Wt 205.0 lb

## 2024-07-01 DIAGNOSIS — I482 Chronic atrial fibrillation, unspecified: Secondary | ICD-10-CM | POA: Diagnosis not present

## 2024-07-01 DIAGNOSIS — R0683 Snoring: Secondary | ICD-10-CM | POA: Diagnosis not present

## 2024-07-01 DIAGNOSIS — Z95810 Presence of automatic (implantable) cardiac defibrillator: Secondary | ICD-10-CM | POA: Diagnosis not present

## 2024-07-01 DIAGNOSIS — I25119 Atherosclerotic heart disease of native coronary artery with unspecified angina pectoris: Secondary | ICD-10-CM

## 2024-07-01 DIAGNOSIS — R5383 Other fatigue: Secondary | ICD-10-CM | POA: Insufficient documentation

## 2024-07-01 DIAGNOSIS — Z79899 Other long term (current) drug therapy: Secondary | ICD-10-CM | POA: Diagnosis not present

## 2024-07-01 DIAGNOSIS — I110 Hypertensive heart disease with heart failure: Secondary | ICD-10-CM | POA: Diagnosis not present

## 2024-07-01 DIAGNOSIS — I340 Nonrheumatic mitral (valve) insufficiency: Secondary | ICD-10-CM | POA: Insufficient documentation

## 2024-07-01 DIAGNOSIS — I252 Old myocardial infarction: Secondary | ICD-10-CM | POA: Insufficient documentation

## 2024-07-01 DIAGNOSIS — G4733 Obstructive sleep apnea (adult) (pediatric): Secondary | ICD-10-CM

## 2024-07-01 DIAGNOSIS — Z7901 Long term (current) use of anticoagulants: Secondary | ICD-10-CM | POA: Diagnosis not present

## 2024-07-01 DIAGNOSIS — I4891 Unspecified atrial fibrillation: Secondary | ICD-10-CM

## 2024-07-01 DIAGNOSIS — Z8673 Personal history of transient ischemic attack (TIA), and cerebral infarction without residual deficits: Secondary | ICD-10-CM | POA: Insufficient documentation

## 2024-07-01 DIAGNOSIS — I2510 Atherosclerotic heart disease of native coronary artery without angina pectoris: Secondary | ICD-10-CM | POA: Diagnosis not present

## 2024-07-01 DIAGNOSIS — I5022 Chronic systolic (congestive) heart failure: Secondary | ICD-10-CM

## 2024-07-01 DIAGNOSIS — Z96642 Presence of left artificial hip joint: Secondary | ICD-10-CM | POA: Insufficient documentation

## 2024-07-01 LAB — BASIC METABOLIC PANEL WITH GFR
Anion gap: 11 (ref 5–15)
BUN: 16 mg/dL (ref 8–23)
CO2: 24 mmol/L (ref 22–32)
Calcium: 9.3 mg/dL (ref 8.9–10.3)
Chloride: 99 mmol/L (ref 98–111)
Creatinine, Ser: 1.04 mg/dL (ref 0.61–1.24)
GFR, Estimated: >60 mL/min (ref >60)
Glucose, Bld: 98 mg/dL (ref 70–99)
Potassium: 4.1 mmol/L (ref 3.5–5.1)
Sodium: 134 mmol/L — ABNORMAL LOW (ref 135–145)

## 2024-07-01 LAB — BRAIN NATRIURETIC PEPTIDE: B Natriuretic Peptide: 211.7 pg/mL — ABNORMAL HIGH (ref 0.0–100.0)

## 2024-07-01 LAB — DIGOXIN LEVEL: Digoxin Level: <0.6 ng/mL — ABNORMAL LOW (ref 0.8–2.0)

## 2024-07-01 NOTE — Addendum Note (Signed)
 Encounter addended by: Buell Shorkey HERO, RN on: 07/01/2024 11:16 AM  Actions taken: Order list changed, Diagnosis association updated, Clinical Note Signed

## 2024-07-01 NOTE — Progress Notes (Signed)
 ADVANCED HF CLINIC NOTE  Referring Physician: Trudy Vaughn FALCON, MD Primary Care: Trudy Vaughn FALCON, MD Primary Cardiologist: Jayson Sierras, MD  Chief Complaint: Heart failure  HPI:  [name] is a 73 y.o. male (father of Benton Righter RN). He is a retired Psychologist, occupational. Referred by Dr. Sierras for further evalaution of his HF  Had an anterior MI in 1994 with BMS to the LAD and OM1.  Follow-up cardiac catheterization in 2019 revealed patent stent sites EF 25-35%  Underwent STJ ICD with change-out 5/18   Had remote CVA but recovered well   Had left hip replacement on 11/24/23. Struggled post-op with progressive HF symptoms and low BP.   Echo 01/22/24 EF 25% mild to moderate MR severe LAE RV ok   We saw on 01/23/24 and was struggling with low BP and NYHA IIIB symptoms. B-blocker cut back, digoxin  added and VAD w/u discussed.   CPX 03/21/24 FVC 3.41(75%)      FEV1 2.58 (76%)        FEV1/FVC 76 (101%)         BP rest: 92/82 Standing BP: 110/82     BP peak: 142/82  pVO2: 15.1 (58% predicted peak VO2)  VE/VCO2 slope:  40  pRER: 1.22  Ventilatory Threshold: 10.2  (39%) VE/MVV:  81%   [facility]amar sleep study 7/25: Severe OSA/CSA: OSA 55/hr CSA 32/hr  Here for f/u. Says he is feeling ok,Remains fatigued though. Sleep study with severe OSA/CSA. Got a new mask but awaiting Bipap. Able to do ADLs without problem. No CP, edema, orthopnea or PND. 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 s/p BMS to LAD and OM1 b. 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HCC)     Ischemic cardiomyopathy    LVEF 30%, status post ICD   OSA (obstructive sleep apnea)    unable to tolerate CPAP   Paroxysmal atrial fibrillation (HCC)    On coumadin    Pseudoaneurysm    SCC (squamous cell carcinoma) 08/04/2006   right shoulder (CX35FU)   SCC (squamous cell carcinoma) well differentiated 12/13/2011   right temple   Squamous cell carcinoma in situ (SCCIS) 08/04/2006   right sholder   Squamous cell carcinoma of skin 11/09/2020   in situ- left upper back   Squamous cell carcinoma of skin 11/09/2020   KA-right dorsal hand   Stroke [facility])     Current Outpatient Medications  Medication Sig Dispense Refill   aspirin  EC 81 MG tablet Take 81 mg by mouth in the morning.     bisoprolol  (ZEBETA ) 5 MG tablet Take 0.5 tablets (2.5 mg total) by mouth daily. 45 tablet 3   digoxin  (LANOXIN ) 0.125 MG tablet Take 1 tablet (0.125 mg total) by mouth daily. 90 tablet 3   ezetimibe (ZETIA) 10 MG tablet Take 10 mg by mouth daily.     JARDIANCE  10 MG TABS tablet TAKE 1 TABLET BY MOUTH EVERY DAY BEFORE BREAKFAST 30 tablet 11   losartan  (COZAAR ) 25 MG tablet Take 1 tablet (25 mg  total) by mouth daily. 90 tablet 3   nitroGLYCERIN  (NITROSTAT ) 0.4 MG SL tablet Place 1 tablet (0.4 mg total) under the tongue every 5 (five) minutes x 3 doses as needed for chest pain (dissolve under tongue if no relief after 3rd dose proceed to ED or call 911). 25 tablet 2   rosuvastatin  (CRESTOR ) 20 MG tablet TAKE 1 TABLET BY MOUTH EVERY DAY 90 tablet 2   spironolactone  (ALDACTONE ) 25 MG tablet TAKE 1/2 TABLET BY MOUTH EVERY DAY 45 tablet 1   warfarin (COUMADIN ) 5 MG tablet TAKE 1/2 TABLET DAILY EXCEPT 1 TABLET ON TUESDAYS AND SATURDAYS OR AS DIRECTED 90 tablet 3   No current facility-administered medications for this encounter.    No Known Allergies    Social History   Socioeconomic History   Marital status: Married    Spouse name: Not on file   Number of children: Not on file   Years of education: Not on  file   Highest education level: Not on file  Occupational History   Occupation: retired    Comment: Banking  Tobacco Use   Smoking status: Never    Passive exposure: Never   Smokeless tobacco: Never  Vaping Use   Vaping status: Never Used  Substance and Sexual Activity   Alcohol  use: Yes    Comment: occasional moderate   Drug use: No   Sexual activity: Yes  Other Topics Concern   Not on file  Social History Narrative   Not on file   Social Drivers of Health   Financial Resource Strain: Not on file  Food Insecurity: No Food Insecurity (11/24/2023)   Hunger Vital Sign    Worried About Running Out of Food in the Last Year: Never true    Ran Out of Food in the Last Year: Never true  Transportation Needs: No Transportation Needs (11/24/2023)   PRAPARE - Administrator, Civil Service (Medical): No    Lack of Transportation (Non-Medical): No  Physical Activity: Not on file  Stress: Not on file  Social Connections: Socially Integrated (11/24/2023)   Social Connection and Isolation Panel    Frequency of Communication with Friends and Family: More than three times a week    Frequency of Social Gatherings with Friends and Family: More than three times a week    Attends Religious Services: More than 4 times per year    Active Member of [employer] or Organizations: Yes    Attends Engineer, structural: More than 4 times per year    Marital Status: Married  Catering manager Violence: Not At Risk (11/24/2023)   Humiliation, Afraid, Rape, and Kick questionnaire    Fear of Current or Ex-Partner: No    Emotionally Abused: No    Physically Abused: No    Sexually Abused: No      Family History  Problem Relation Age of Onset   Cancer Mother        lymphoma    Heart attack Father    Heart disease Brother    Colon cancer Neg Hx    Colon polyps Neg Hx     Vitals:   07/01/24 1041  BP: 92/60  Pulse: 87  SpO2: 97%  Weight: 93 kg (205 lb)   Wt Readings from Last 3  Encounters:  07/01/24 93 kg (205 lb)  06/17/24 91.6 kg (202 lb)  06/05/24 94.2 kg (207 lb 9.6 oz)    PHYSICAL EXAM: General:  Well appearing. No resp difficulty HEENT: normal  Neck: supple. no JVD. Carotids 2+ bilat; no bruits. No lymphadenopathy or thryomegaly appreciated. Cor: PMI nondisplaced. irregular rate & rhythm. No rubs, gallops or murmurs. Lungs: clear Abdomen: soft, nontender, nondistended. No hepatosplenomegaly. No bruits or masses. Good bowel sounds. Extremities: no cyanosis, clubbing, rash, edema Neuro: alert & orientedx3, cranial nerves grossly intact. moves all 4 extremities w/o difficulty. Affect pleasant  ASSESSMENT & PLAN:  1. Chronic systolic HF due to iCM - anterior MI in 1994 with BMS to the LAD and OM1.   - Follow-up cardiac catheterization in 2019 revealed patent stent sites EF 25-35% - Underwent STJ ICD with change-out 5/18. - ICD interrogated today in clinic. No VT/VF. Volume ok  - Echo 01/22/24 EF 25% mild to moderate MR severe LAE RV ok  - Stable NYHA II  - Volume ok - Continue bisoprolol  2.5 mg daily - Continue Jardiance  10 - Continue losartan  25mg  daily - Continue spiro 12.5 - Continue digoxin  0.125 daily - BP too low to titrate GDMT  - CPX 03/21/24: pVO2: 15.1 (58%) slope:  40  pRER: 1.22  VO2 @ VT: 10.2  (39%) VE/MVV:  81%  - CPX with significant HF limitation but overall I think it remains too early to consider VAD. Will continue aggressive medical Rx.Continue to follow closely - Labs today  2. CAD - s/p status post previous anterior wall infarct in 1994 with BMS to the LAD and OM1.   - Follow-up cardiac catheterization in 2019 revealed patent stent sites.  - No s/s angina - Continue ASA/statin   3.  Chronic atrial fibrillation  - CHA2DS2-VASc score of 6.   - He has preferred Coumadin  to DOAC's and continues with follow-up in the anticoagulation clinic.   -Rate controlled. No bleeding  4. Snoring/fatigue -[place]amar sleep study 7/25: Severe  OSA/CSA: OSA 55/hr CSA 32/hr - Awaiting Bipap and f/u with Dr. Shlomo Toribio Fuel, MD  11:00 AM

## 2024-07-01 NOTE — Patient Instructions (Signed)
 Medication Changes:  None, continue current medications  Lab Work:  Labs done today, your results will be available in MyChart, we will contact you for abnormal readings.  Special Instructions // Education:  Do the following things EVERYDAY: Weigh yourself in the morning before breakfast. Write it down and keep it in a log. Take your medicines as prescribed Eat low salt foods--Limit salt (sodium) to 2000 mg per day.  Stay as active as you can everyday Limit all fluids for the day to less than 2 liters   Follow-Up in: 6 months with Dr Cherrie (April 2026), **PLEASE CALL OUR OFFICE IN FEBRUARY TO SCHEDULE THIS APPOINTMENT   At the [facility], you and your health needs are our priority. We have a designated team specialized in the treatment of Heart Failure. This Care Team includes your primary Heart Failure Specialized Cardiologist (physician), Advanced Practice Providers (APPs- Physician Assistants and Nurse Practitioners), and Pharmacist who all work together to provide you with the care you need, when you need it.   You may see any of the following providers on your designated Care Team at your next follow up:  Dr. Toribio Cherrie Dr. Ezra Shuck Dr. Ria Commander Dr. Odis Brownie Greig Mosses, NP [place]affie Shed, [place] [facility] [place], [place] Beckey Coe, NP [place] Lee, NP Tinnie Redman, PharmD   Please be sure to bring in all your medications bottles to every appointment.   Need to Contact Us :  If you have any questions or concerns before your next appointment please send us  a message through [place] or call our office at [phone].    TO LEAVE A MESSAGE FOR THE NURSE SELECT OPTION 2, PLEASE LEAVE A MESSAGE INCLUDING: YOUR NAME DATE OF BIRTH CALL BACK NUMBER REASON FOR CALL**this is important as we prioritize the call backs  YOU WILL RECEIVE A CALL BACK THE SAME DAY AS LONG AS YOU CALL BEFORE 4:00 PM

## 2024-07-08 ENCOUNTER — Ambulatory Visit (INDEPENDENT_AMBULATORY_CARE_PROVIDER_SITE_OTHER)

## 2024-07-08 ENCOUNTER — Ambulatory Visit: Attending: Cardiovascular Disease

## 2024-07-08 ENCOUNTER — Ambulatory Visit: Admitting: Orthopaedic Surgery

## 2024-07-08 ENCOUNTER — Encounter: Payer: Self-pay | Admitting: Orthopaedic Surgery

## 2024-07-08 DIAGNOSIS — I5022 Chronic systolic (congestive) heart failure: Secondary | ICD-10-CM

## 2024-07-08 DIAGNOSIS — Z96642 Presence of left artificial hip joint: Secondary | ICD-10-CM

## 2024-07-08 DIAGNOSIS — Z95810 Presence of automatic (implantable) cardiac defibrillator: Secondary | ICD-10-CM | POA: Diagnosis not present

## 2024-07-08 NOTE — Progress Notes (Signed)
 The patient is a 73 year old male who is now over 7 months post a left total hip replacement to treat significant left hip pain and arthritis.  He reports that he is doing well.  He is walking without any assistive device.  He denies any significant pain or discomfort but does occasionally have some pain going down the leg which is to be expected and should hopefully resolve as time goes by.  He denies any issues with his right hip.  He walks without assistive device and a normal-appearing gait.  His leg lengths are equal.  He tolerates me easily putting his left operative hip the range of motion as well as his right hip.  An AP pelvis and lateral of the left hip shows a well-seated total hip arthroplasty with no complicating features.  The right hip does show some slight narrowing.  This point follow-up for his left hip can be as needed.  If he does develop any issues with the left hip or any other orthopedic issues he knows to reach out.  All questions and concerns were addressed and answered.

## 2024-07-10 ENCOUNTER — Ambulatory Visit: Attending: Cardiology | Admitting: *Deleted

## 2024-07-10 DIAGNOSIS — I6350 Cerebral infarction due to unspecified occlusion or stenosis of unspecified cerebral artery: Secondary | ICD-10-CM

## 2024-07-10 DIAGNOSIS — Z5181 Encounter for therapeutic drug level monitoring: Secondary | ICD-10-CM | POA: Diagnosis not present

## 2024-07-10 DIAGNOSIS — I4891 Unspecified atrial fibrillation: Secondary | ICD-10-CM

## 2024-07-10 LAB — POCT INR: INR: 2.7 (ref 2.0–3.0)

## 2024-07-10 NOTE — Progress Notes (Signed)
 INR 2.7; Please see anticoagulation encounter

## 2024-07-10 NOTE — Progress Notes (Signed)
 EPIC Encounter for ICM Monitoring  Patient Name: [name] is a 73 y.o. male Date: 07/10/2024 Primary Care Physican: Trudy Vaughn FALCON, MD Primary Cardiologist: Debera Electrophysiologist: Mealor 02/01/2023 Weight: 218 lbs 06/30/2023 Weight: 221 lbs 09/12/2023 Weight: 221 lbs  11/21/2023 Weight: 221-223 lbs 12/19/2023 Office Weight: 215 lbs 12/26/2023 Weight: 214 lbs 01/31/2024 Weight: 212 lbs 03/08/2024 Weight: 210 lbs 04/24/2024 Weight: 210 lbs 05/28/2024 Weight: 210 lbs 07/01/2024 Office Visit: 205 lbs 07/10/2024 Weight: 205 lbs   Spoke with patient and heart failure questions reviewed.  Transmission results reviewed.  Pt asymptomatic for fluid accumulation.  Reports feeling well at this time and voices no complaints.     Since 05/27/2024 ICM Remote Transmission: [facility] Thoracic impedance suggesting intermittent days with possible fluid accumulation.   Prescribed:  Spironolactone  25 mg take 0.5 tablet (12.5 mg total) daily. Jardiance  10 mg take 1 tablet by mouth before breakfast   Labs: 07/01/2024 Creatinine 1.04, BUN 16, Potassium 4.1, Sodium 134, GFR >60  04/17/2024 Creatinine 1.03, BUN 17, Potassium 4.3, Sodium 138, GFR >60 02/09/2024 Creatinine 1.11, BUN 19, Potassium 4.4, Sodium 137, GFR >60 12/25/2023 Creatinine 1.14, BUN 12, Potassium 4.2, Sodium 136, GFR 68 A complete set of results can be found in Results Review.  Recommendations:  No changes and encouraged to call if experiencing any fluid symptoms.   Follow-up plan: ICM clinic phone appointment on 08/12/2024.  91 day device clinic remote transmission 09/02/2024.       EP/Cardiology Office visits:  12/09/2024 with Dr Debera.   07/26/2024 with Dr Nancey.  Recall 12/28/2024 with Dr Cherrie.   Copy of ICM check sent to Dr. Nancey.     Remote monitoring is medically necessary for Heart Failure Management.    Daily Thoracic Impedance ICM trend: 04/09/2024 through 07/08/2024.    12-14 Month Thoracic Impedance ICM  trend:     [name], RN 07/10/2024 10:33 AM

## 2024-07-10 NOTE — Patient Instructions (Signed)
 Continue warfarin 1/2 tablet daily except 1 tablet on Wednesdays and Saturdays.  Recheck INR in 4 wks

## 2024-07-15 ENCOUNTER — Telehealth: Payer: Self-pay | Admitting: Cardiology

## 2024-07-15 NOTE — Telephone Encounter (Signed)
 Pt calling for an update on getting CPAP

## 2024-07-18 ENCOUNTER — Encounter: Payer: Self-pay | Admitting: Internal Medicine

## 2024-07-22 ENCOUNTER — Encounter: Payer: Self-pay | Admitting: Radiology

## 2024-07-26 ENCOUNTER — Ambulatory Visit: Payer: Commercial Managed Care - PPO | Admitting: Cardiovascular Disease

## 2024-08-07 ENCOUNTER — Ambulatory Visit: Attending: Cardiology | Admitting: *Deleted

## 2024-08-07 DIAGNOSIS — I4891 Unspecified atrial fibrillation: Secondary | ICD-10-CM

## 2024-08-07 DIAGNOSIS — I6350 Cerebral infarction due to unspecified occlusion or stenosis of unspecified cerebral artery: Secondary | ICD-10-CM | POA: Diagnosis not present

## 2024-08-07 DIAGNOSIS — Z5181 Encounter for therapeutic drug level monitoring: Secondary | ICD-10-CM

## 2024-08-07 LAB — POCT INR: INR: 2.1 (ref 2.0–3.0)

## 2024-08-07 NOTE — Patient Instructions (Signed)
 Continue warfarin 1/2 tablet daily except 1 tablet on Wednesdays and Saturdays.  Recheck INR in 4 wks

## 2024-08-07 NOTE — Progress Notes (Signed)
 INR 2.1; Please see anticoagulation encounter

## 2024-08-12 ENCOUNTER — Ambulatory Visit: Attending: Cardiovascular Disease

## 2024-08-12 DIAGNOSIS — I5022 Chronic systolic (congestive) heart failure: Secondary | ICD-10-CM

## 2024-08-12 DIAGNOSIS — Z95810 Presence of automatic (implantable) cardiac defibrillator: Secondary | ICD-10-CM | POA: Diagnosis not present

## 2024-08-13 ENCOUNTER — Telehealth: Payer: Self-pay

## 2024-08-13 NOTE — Progress Notes (Signed)
 EPIC Encounter for ICM Monitoring  Patient Name: [name] is a 73 y.o. male Date: 08/13/2024 Primary Care Physican: Trudy Vaughn FALCON, MD Primary Cardiologist: Debera Electrophysiologist: Mealor 02/01/2023 Weight: 218 lbs 06/30/2023 Weight: 221 lbs 09/12/2023 Weight: 221 lbs  11/21/2023 Weight: 221-223 lbs 12/19/2023 Office Weight: 215 lbs 12/26/2023 Weight: 214 lbs 01/31/2024 Weight: 212 lbs 03/08/2024 Weight: 210 lbs 04/24/2024 Weight: 210 lbs 05/28/2024 Weight: 210 lbs 07/01/2024 Office Visit: 205 lbs 07/10/2024 Weight: 205 lbs   Attempted call to patient and unable to reach.  Left detailed message per DPR regarding transmission.  Transmission results reviewed.    Since 05/27/2024 ICM Remote Transmission: [facility] Thoracic impedance suggesting possible fluid accumulation from 07/19/2024-08/05/2024.  Trending baseline normal since 08/06/2024   Prescribed:  Spironolactone  25 mg take 0.5 tablet (12.5 mg total) daily. Jardiance  10 mg take 1 tablet by mouth before breakfast   Labs: 07/01/2024 Creatinine 1.04, BUN 16, Potassium 4.1, Sodium 134, GFR >60  04/17/2024 Creatinine 1.03, BUN 17, Potassium 4.3, Sodium 138, GFR >60 02/09/2024 Creatinine 1.11, BUN 19, Potassium 4.4, Sodium 137, GFR >60 12/25/2023 Creatinine 1.14, BUN 12, Potassium 4.2, Sodium 136, GFR 68 A complete set of results can be found in Results Review.   Recommendations:  Left voice mail with ICM number and encouraged to call if experiencing any fluid symptoms.   Follow-up plan: ICM clinic phone appointment on 09/23/2024.  91 day device clinic remote transmission 09/02/2024.       EP/Cardiology Office visits:  12/09/2024 with Dr Debera.   10/11/2024 with Dr Nancey.  Recall 12/28/2024 with Dr Cherrie.   Copy of ICM check sent to Dr. Nancey.    Remote monitoring is medically necessary for Heart Failure Management.    Daily Thoracic Impedance ICM trend: 05/14/2024 through 08/12/2024.    12-14 Month Thoracic Impedance  ICM trend:     [name], RN 08/13/2024 4:22 PM

## 2024-08-13 NOTE — Telephone Encounter (Signed)
 Remote ICM transmission received.  Attempted call to patient regarding ICM remote transmission.  Left detailed message per DPR with ICM phone number to return call for any questions, concerns or fluid symptoms.

## 2024-09-02 ENCOUNTER — Ambulatory Visit: Payer: BC Managed Care – PPO

## 2024-09-02 DIAGNOSIS — I5022 Chronic systolic (congestive) heart failure: Secondary | ICD-10-CM | POA: Diagnosis not present

## 2024-09-04 ENCOUNTER — Ambulatory Visit: Attending: Cardiology

## 2024-09-04 DIAGNOSIS — I4891 Unspecified atrial fibrillation: Secondary | ICD-10-CM

## 2024-09-04 DIAGNOSIS — Z5181 Encounter for therapeutic drug level monitoring: Secondary | ICD-10-CM

## 2024-09-04 DIAGNOSIS — I6350 Cerebral infarction due to unspecified occlusion or stenosis of unspecified cerebral artery: Secondary | ICD-10-CM | POA: Diagnosis not present

## 2024-09-04 LAB — CUP PACEART REMOTE DEVICE CHECK
Battery Remaining Longevity: 41 mo
Battery Remaining Percentage: 39 %
Battery Voltage: 2.92 V
Brady Statistic RV Percent Paced: 1 %
Date Time Interrogation Session: 20251215045849
HighPow Impedance: 46 Ohm
HighPow Impedance: 46 Ohm
Implantable Lead Connection Status: 753985
Implantable Lead Implant Date: 20020111
Implantable Lead Location: 753860
Implantable Lead Model: 148
Implantable Lead Serial Number: 116740
Implantable Pulse Generator Implant Date: 20180503
Lead Channel Impedance Value: 600 Ohm
Lead Channel Pacing Threshold Amplitude: 1.25 V
Lead Channel Pacing Threshold Pulse Width: 0.5 ms
Lead Channel Sensing Intrinsic Amplitude: 12 mV
Lead Channel Setting Pacing Amplitude: 2.5 V
Lead Channel Setting Pacing Pulse Width: 0.5 ms
Lead Channel Setting Sensing Sensitivity: 0.5 mV
Pulse Gen Serial Number: 7421148

## 2024-09-04 LAB — POCT INR: INR: 2.1 (ref 2.0–3.0)

## 2024-09-04 NOTE — Patient Instructions (Signed)
 Continue warfarin 1/2 tablet daily except 1 tablet on Wednesdays and Saturdays.  Recheck INR in 6 wks

## 2024-09-04 NOTE — Progress Notes (Signed)
 INR 2.1; Please see anticoagulation encounter

## 2024-09-06 NOTE — Progress Notes (Signed)
 Remote ICD Transmission

## 2024-09-18 ENCOUNTER — Ambulatory Visit: Payer: Self-pay | Admitting: Cardiovascular Disease

## 2024-09-23 ENCOUNTER — Ambulatory Visit: Attending: Cardiovascular Disease

## 2024-09-23 DIAGNOSIS — I5022 Chronic systolic (congestive) heart failure: Secondary | ICD-10-CM

## 2024-09-23 DIAGNOSIS — Z95810 Presence of automatic (implantable) cardiac defibrillator: Secondary | ICD-10-CM | POA: Diagnosis not present

## 2024-09-25 NOTE — Progress Notes (Signed)
 EPIC Encounter for ICM Monitoring  Patient Name: [name] is a 74 y.o. male Date: 09/25/2024 Primary Care Physican: Trudy Vaughn FALCON, MD Primary Cardiologist: Debera Electrophysiologist: Mealor 02/01/2023 Weight: 218 lbs 06/30/2023 Weight: 221 lbs 09/12/2023 Weight: 221 lbs  11/21/2023 Weight: 221-223 lbs 12/19/2023 Office Weight: 215 lbs 12/26/2023 Weight: 214 lbs 01/31/2024 Weight: 212 lbs 03/08/2024 Weight: 210 lbs 04/24/2024 Weight: 210 lbs 05/28/2024 Weight: 210 lbs 07/01/2024 Office Visit: 205 lbs 07/10/2024 Weight: 205 lbs   Spoke with patient and heart failure questions reviewed.  Transmission results reviewed.  Pt asymptomatic for fluid accumulation.  Reports feeling well at this time and voices no complaints.      Since 08/12/2024 ICM Remote Transmission: [facility] Thoracic impedance suggesting normal fluid levels.   Prescribed:  Spironolactone  25 mg take 0.5 tablet (12.5 mg total) daily. Jardiance  10 mg take 1 tablet by mouth before breakfast   Labs: 07/01/2024 Creatinine 1.04, BUN 16, Potassium 4.1, Sodium 134, GFR >60  04/17/2024 Creatinine 1.03, BUN 17, Potassium 4.3, Sodium 138, GFR >60 02/09/2024 Creatinine 1.11, BUN 19, Potassium 4.4, Sodium 137, GFR >60 12/25/2023 Creatinine 1.14, BUN 12, Potassium 4.2, Sodium 136, GFR 68 A complete set of results can be found in Results Review.   Recommendations:  No changes and encouraged to call if experiencing any fluid symptoms.   Follow-up plan: ICM clinic phone appointment on 10/24/2024.  91 day device clinic remote transmission 12/02/2024.       EP/Cardiology Office visits:  12/09/2024 with Dr Debera.   10/11/2024 with Dr Nancey.  Recall 12/28/2024 with Dr Cherrie.   Copy of ICM check sent to Dr. Nancey.      Remote monitoring is medically necessary for Heart Failure Management.    Daily Thoracic Impedance ICM trend: 06/25/2024 through 09/23/2024.    12-14 Month Thoracic Impedance ICM trend:     [name],  RN 09/25/2024 1:36 PM

## 2024-10-05 ENCOUNTER — Other Ambulatory Visit: Payer: Self-pay | Admitting: Cardiology

## 2024-10-07 NOTE — Telephone Encounter (Signed)
 Refill request for warfarin:  Last INR was 2.1 on 09/04/24 Next INR due 10/16/24 LOV was 07/01/24  Refill approved.

## 2024-10-11 ENCOUNTER — Ambulatory Visit: Admitting: Cardiovascular Disease

## 2024-10-16 ENCOUNTER — Ambulatory Visit: Payer: Self-pay | Attending: Cardiology | Admitting: *Deleted

## 2024-10-16 DIAGNOSIS — Z5181 Encounter for therapeutic drug level monitoring: Secondary | ICD-10-CM

## 2024-10-16 DIAGNOSIS — I6350 Cerebral infarction due to unspecified occlusion or stenosis of unspecified cerebral artery: Secondary | ICD-10-CM

## 2024-10-16 DIAGNOSIS — I4891 Unspecified atrial fibrillation: Secondary | ICD-10-CM

## 2024-10-16 LAB — POCT INR: INR: 2.2 (ref 2.0–3.0)

## 2024-10-16 NOTE — Patient Instructions (Signed)
 Continue warfarin 1/2 tablet daily except 1 tablet on Wednesdays and Saturdays.  Recheck INR in 6 wks

## 2024-10-16 NOTE — Progress Notes (Signed)
 INR 2.2

## 2024-10-17 ENCOUNTER — Telehealth: Payer: Self-pay | Admitting: Cardiology

## 2024-10-17 NOTE — Telephone Encounter (Signed)
 Pt c/o medication issue:  1. Name of Medication:   warfarin (COUMADIN ) 5 MG tablet   Predisone10mg  - 3x for 2 days, 2x for 5 days, and then 1x until pills are gone  2. How are you currently taking this medication (dosage and times per day)? As written   3. Are you having a reaction (difficulty breathing--STAT)?  No   4. What is your medication issue? Pt called in asking if its safe to take Prednisone  with warfarin.

## 2024-10-17 NOTE — Progress Notes (Signed)
 31 day ICM Remote transmission canceled due to [facility] clinic is on hold until further notice.  91 day remote monitoring will continue per protocol.

## 2024-10-21 ENCOUNTER — Telehealth: Payer: Self-pay

## 2024-10-21 NOTE — Telephone Encounter (Signed)
 SABRA

## 2024-10-22 ENCOUNTER — Ambulatory Visit: Payer: Self-pay | Admitting: Cardiology

## 2024-10-22 ENCOUNTER — Encounter: Payer: Self-pay | Admitting: Cardiology

## 2024-10-22 ENCOUNTER — Telehealth: Payer: Self-pay | Admitting: *Deleted

## 2024-10-22 VITALS — BP 94/64 | HR 93 | Ht 72.0 in | Wt 208.0 lb

## 2024-10-22 DIAGNOSIS — I10 Essential (primary) hypertension: Secondary | ICD-10-CM

## 2024-10-22 DIAGNOSIS — G4733 Obstructive sleep apnea (adult) (pediatric): Secondary | ICD-10-CM | POA: Diagnosis not present

## 2024-10-22 NOTE — Telephone Encounter (Signed)
 Pt walked into office.  Has a question about taking prednisone  that was prescribed with his warfarin.  He called the office on 10/17/24 when I was off but no one called him back.    Pt states he saw Orthopedist on 1/29 and he was given an Rx for Prednisone  30mg  x 2 days, 20mg  x 5 days then 10mg  till gone (#21 tablets).  He has not started it yet.  Told pt to start prednisone  as instructed and decrease warfarin to 2.5mg  daily.  INR check scheduled for 10/29/24.  Will adjust dose at that time if needed.

## 2024-10-24 ENCOUNTER — Ambulatory Visit: Payer: Self-pay

## 2024-10-29 ENCOUNTER — Ambulatory Visit

## 2024-11-27 ENCOUNTER — Ambulatory Visit

## 2024-12-09 ENCOUNTER — Ambulatory Visit: Admitting: Cardiology

## 2024-12-20 ENCOUNTER — Ambulatory Visit: Admitting: Cardiovascular Disease

## 2025-01-03 ENCOUNTER — Ambulatory Visit (HOSPITAL_COMMUNITY): Admitting: Internal Medicine

## 2025-04-08 ENCOUNTER — Ambulatory Visit: Admitting: Urology
